# Patient Record
Sex: Male | Born: 1960
Health system: Southern US, Community
[De-identification: ages and names within clinical notes are randomized; demographics above are authoritative.]

## PROBLEM LIST (undated history)

## (undated) DIAGNOSIS — Z21 Asymptomatic human immunodeficiency virus [HIV] infection status: Secondary | ICD-10-CM

## (undated) DIAGNOSIS — F1010 Alcohol abuse, uncomplicated: Secondary | ICD-10-CM

## (undated) DIAGNOSIS — F1210 Cannabis abuse, uncomplicated: Secondary | ICD-10-CM

## (undated) DIAGNOSIS — Z720 Tobacco use: Secondary | ICD-10-CM

## (undated) DIAGNOSIS — G709 Myoneural disorder, unspecified: Secondary | ICD-10-CM

## (undated) DIAGNOSIS — I639 Cerebral infarction, unspecified: Secondary | ICD-10-CM

## (undated) DIAGNOSIS — F1410 Cocaine abuse, uncomplicated: Secondary | ICD-10-CM

## (undated) DIAGNOSIS — E785 Hyperlipidemia, unspecified: Secondary | ICD-10-CM

## (undated) DIAGNOSIS — I10 Essential (primary) hypertension: Secondary | ICD-10-CM

## (undated) DIAGNOSIS — R569 Unspecified convulsions: Secondary | ICD-10-CM

## (undated) DIAGNOSIS — G3184 Mild cognitive impairment, so stated: Secondary | ICD-10-CM

## (undated) DIAGNOSIS — F1910 Other psychoactive substance abuse, uncomplicated: Secondary | ICD-10-CM

## (undated) DIAGNOSIS — B20 Human immunodeficiency virus [HIV] disease: Secondary | ICD-10-CM

## (undated) HISTORY — DX: Myoneural disorder, unspecified: G70.9

## (undated) HISTORY — PX: HEMORRHOID SURGERY: SHX153

## (undated) HISTORY — DX: Unspecified convulsions: R56.9

## (undated) HISTORY — PX: CLOSED REDUCTION PATELLAR: SHX5007

## (undated) HISTORY — DX: Essential (primary) hypertension: I10

## (undated) HISTORY — DX: Tobacco use: Z72.0

## (undated) HISTORY — DX: Alcohol abuse, uncomplicated: F10.10

## (undated) HISTORY — DX: Asymptomatic human immunodeficiency virus (hiv) infection status: Z21

## (undated) HISTORY — DX: Mild cognitive impairment of uncertain or unknown etiology: G31.84

## (undated) HISTORY — DX: Hyperlipidemia, unspecified: E78.5

## (undated) HISTORY — DX: Cocaine abuse, uncomplicated: F14.10

## (undated) HISTORY — DX: Other psychoactive substance abuse, uncomplicated: F19.10

## (undated) HISTORY — DX: Cerebral infarction, unspecified: I63.9

## (undated) HISTORY — DX: Cannabis abuse, uncomplicated: F12.10

## (undated) HISTORY — DX: Human immunodeficiency virus (HIV) disease: B20

## (undated) MED FILL — Divalproex Sodium Cap Delayed Release Sprinkle 125 MG: ORAL | Fill #0 | Status: CN

---

## 2001-11-27 ENCOUNTER — Emergency Department (HOSPITAL_COMMUNITY): Admission: EM | Admit: 2001-11-27 | Discharge: 2001-11-27 | Payer: Self-pay | Admitting: Emergency Medicine

## 2001-11-29 ENCOUNTER — Emergency Department (HOSPITAL_COMMUNITY): Admission: EM | Admit: 2001-11-29 | Discharge: 2001-11-29 | Payer: Self-pay | Admitting: *Deleted

## 2002-08-03 ENCOUNTER — Encounter: Payer: Self-pay | Admitting: Emergency Medicine

## 2002-08-03 ENCOUNTER — Inpatient Hospital Stay (HOSPITAL_COMMUNITY): Admission: EM | Admit: 2002-08-03 | Discharge: 2002-08-13 | Payer: Self-pay | Admitting: Emergency Medicine

## 2002-08-06 ENCOUNTER — Encounter: Payer: Self-pay | Admitting: Internal Medicine

## 2002-08-07 ENCOUNTER — Encounter: Payer: Self-pay | Admitting: Internal Medicine

## 2002-08-20 ENCOUNTER — Encounter: Admission: RE | Admit: 2002-08-20 | Discharge: 2002-08-20 | Payer: Self-pay | Admitting: Internal Medicine

## 2002-09-24 ENCOUNTER — Encounter (INDEPENDENT_AMBULATORY_CARE_PROVIDER_SITE_OTHER): Payer: Self-pay | Admitting: *Deleted

## 2002-09-24 ENCOUNTER — Ambulatory Visit (HOSPITAL_BASED_OUTPATIENT_CLINIC_OR_DEPARTMENT_OTHER): Admission: RE | Admit: 2002-09-24 | Discharge: 2002-09-24 | Payer: Self-pay | Admitting: Surgery

## 2007-12-18 ENCOUNTER — Encounter: Admission: RE | Admit: 2007-12-18 | Discharge: 2007-12-18 | Payer: Self-pay | Admitting: Orthopedic Surgery

## 2007-12-26 ENCOUNTER — Emergency Department (HOSPITAL_COMMUNITY): Admission: EM | Admit: 2007-12-26 | Discharge: 2007-12-26 | Payer: Self-pay | Admitting: Family Medicine

## 2008-01-19 HISTORY — PX: SHOULDER ARTHROSCOPY: SHX128

## 2008-01-22 ENCOUNTER — Encounter: Admission: RE | Admit: 2008-01-22 | Discharge: 2008-01-22 | Payer: Self-pay | Admitting: Orthopedic Surgery

## 2008-02-12 ENCOUNTER — Inpatient Hospital Stay (HOSPITAL_COMMUNITY): Admission: RE | Admit: 2008-02-12 | Discharge: 2008-02-16 | Payer: Self-pay | Admitting: Orthopedic Surgery

## 2008-02-12 ENCOUNTER — Encounter (INDEPENDENT_AMBULATORY_CARE_PROVIDER_SITE_OTHER): Payer: Self-pay | Admitting: Orthopedic Surgery

## 2008-02-12 ENCOUNTER — Encounter: Payer: Self-pay | Admitting: Internal Medicine

## 2008-02-13 ENCOUNTER — Encounter: Payer: Self-pay | Admitting: Internal Medicine

## 2008-02-13 LAB — CONVERTED CEMR LAB: TSH: 1.414 microintl units/mL

## 2008-06-24 ENCOUNTER — Ambulatory Visit (HOSPITAL_COMMUNITY): Admission: RE | Admit: 2008-06-24 | Discharge: 2008-06-24 | Payer: Self-pay | Admitting: Internal Medicine

## 2008-06-24 ENCOUNTER — Ambulatory Visit: Payer: Self-pay | Admitting: Internal Medicine

## 2008-06-24 ENCOUNTER — Encounter: Payer: Self-pay | Admitting: Internal Medicine

## 2008-06-24 DIAGNOSIS — F17200 Nicotine dependence, unspecified, uncomplicated: Secondary | ICD-10-CM | POA: Insufficient documentation

## 2008-06-24 DIAGNOSIS — I10 Essential (primary) hypertension: Secondary | ICD-10-CM | POA: Insufficient documentation

## 2008-06-24 DIAGNOSIS — M25512 Pain in left shoulder: Secondary | ICD-10-CM | POA: Insufficient documentation

## 2008-06-24 LAB — CONVERTED CEMR LAB
Glucose, Urine, Semiquant: NEGATIVE
Ketones, urine, test strip: NEGATIVE
Nitrite: NEGATIVE
Protein, U semiquant: >=300
Specific Gravity, Urine: >=1.03
Urobilinogen, UA: 0.2
WBC Urine, dipstick: NEGATIVE
pH: 5.5

## 2008-06-26 DIAGNOSIS — E785 Hyperlipidemia, unspecified: Secondary | ICD-10-CM | POA: Insufficient documentation

## 2008-06-26 LAB — CONVERTED CEMR LAB
ALT: 11 units/L (ref 0–53)
AST: 13 units/L (ref 0–37)
Albumin: 2.9 g/dL — ABNORMAL LOW (ref 3.5–5.2)
Alkaline Phosphatase: 111 units/L (ref 39–117)
Amphetamine Screen, Ur: NEGATIVE
BUN: 16 mg/dL (ref 6–23)
Barbiturate Quant, Ur: NEGATIVE
Benzodiazepines.: NEGATIVE
CO2: 25 meq/L (ref 19–32)
Calcium: 8.2 mg/dL — ABNORMAL LOW (ref 8.4–10.5)
Chloride: 104 meq/L (ref 96–112)
Cholesterol: 198 mg/dL (ref 0–200)
Cocaine Metabolites: NEGATIVE
Creatinine, Ser: 1.36 mg/dL (ref 0.40–1.50)
Creatinine,U: 336.8 mg/dL
Glucose, Bld: 84 mg/dL (ref 70–99)
HCT: 37.4 % — ABNORMAL LOW (ref 39.0–52.0)
HDL: 34 mg/dL — ABNORMAL LOW (ref >39)
Hemoglobin: 12.5 g/dL — ABNORMAL LOW (ref 13.0–17.0)
LDL Cholesterol: 150 mg/dL — ABNORMAL HIGH (ref 0–99)
MCHC: 33.4 g/dL (ref 30.0–36.0)
MCV: 84.8 fL (ref 78.0–100.0)
Marijuana Metabolite: POSITIVE — AB
Methadone: NEGATIVE
Opiates: NEGATIVE
Phencyclidine (PCP): NEGATIVE
Platelets: 207 10*3/uL (ref 150–400)
Potassium: 4 meq/L (ref 3.5–5.3)
Propoxyphene: NEGATIVE
RBC: 4.41 M/uL (ref 4.22–5.81)
RDW: 14.9 % (ref 11.5–15.5)
Sodium: 137 meq/L (ref 135–145)
Total Bilirubin: 0.4 mg/dL (ref 0.3–1.2)
Total CHOL/HDL Ratio: 5.8
Total Protein: 7.4 g/dL (ref 6.0–8.3)
Triglycerides: 72 mg/dL (ref <150)
VLDL: 14 mg/dL (ref 0–40)
WBC: 4.6 10*3/uL (ref 4.0–10.5)

## 2008-08-08 ENCOUNTER — Ambulatory Visit: Payer: Self-pay | Admitting: Internal Medicine

## 2008-08-08 ENCOUNTER — Encounter: Payer: Self-pay | Admitting: Internal Medicine

## 2008-08-08 DIAGNOSIS — F1910 Other psychoactive substance abuse, uncomplicated: Secondary | ICD-10-CM | POA: Insufficient documentation

## 2008-08-08 DIAGNOSIS — F0150 Vascular dementia without behavioral disturbance: Secondary | ICD-10-CM | POA: Insufficient documentation

## 2008-08-09 ENCOUNTER — Encounter: Payer: Self-pay | Admitting: Internal Medicine

## 2008-08-09 LAB — CONVERTED CEMR LAB: RBC Folate: 711 ng/mL — ABNORMAL HIGH (ref 180–600)

## 2008-08-11 ENCOUNTER — Ambulatory Visit (HOSPITAL_COMMUNITY): Admission: RE | Admit: 2008-08-11 | Discharge: 2008-08-11 | Payer: Self-pay | Admitting: Internal Medicine

## 2008-08-12 ENCOUNTER — Encounter: Payer: Self-pay | Admitting: Internal Medicine

## 2008-08-17 DIAGNOSIS — B20 Human immunodeficiency virus [HIV] disease: Secondary | ICD-10-CM | POA: Insufficient documentation

## 2008-08-17 LAB — CONVERTED CEMR LAB
HIV-1 antibody: POSITIVE — AB
HIV-2 Ab: UNDETERMINED — AB
HIV: REACTIVE
Vitamin B-12: 435 pg/mL (ref 211–911)

## 2008-08-28 ENCOUNTER — Ambulatory Visit: Payer: Self-pay | Admitting: *Deleted

## 2008-08-28 ENCOUNTER — Encounter (INDEPENDENT_AMBULATORY_CARE_PROVIDER_SITE_OTHER): Payer: Self-pay | Admitting: *Deleted

## 2008-09-01 ENCOUNTER — Ambulatory Visit: Payer: Self-pay | Admitting: Internal Medicine

## 2008-09-01 ENCOUNTER — Encounter (INDEPENDENT_AMBULATORY_CARE_PROVIDER_SITE_OTHER): Payer: Self-pay | Admitting: *Deleted

## 2008-09-03 LAB — CONVERTED CEMR LAB
ALT: 12 units/L (ref 0–53)
AST: 13 units/L (ref 0–37)
Albumin: 3.1 g/dL — ABNORMAL LOW (ref 3.5–5.2)
Alkaline Phosphatase: 116 units/L (ref 39–117)
BUN: 22 mg/dL (ref 6–23)
Band Neutrophils: 0 % (ref 0–10)
Basophils Absolute: 0 10*3/uL (ref 0.0–0.1)
Basophils Relative: 0 % (ref 0–1)
Bilirubin Urine: NEGATIVE
CO2: 25 meq/L (ref 19–32)
Calcium: 8.7 mg/dL (ref 8.4–10.5)
Chlamydia, Swab/Urine, PCR: NEGATIVE
Chloride: 105 meq/L (ref 96–112)
Cholesterol: 176 mg/dL (ref 0–200)
Creatinine, Ser: 1.25 mg/dL (ref 0.40–1.50)
Eosinophils Absolute: 0.2 10*3/uL (ref 0.0–0.7)
Eosinophils Relative: 3 % (ref 0–5)
GC Probe Amp, Urine: NEGATIVE
Glucose, Bld: 92 mg/dL (ref 70–99)
HCT: 34.5 % — ABNORMAL LOW (ref 39.0–52.0)
HCV Ab: NEGATIVE
HDL: 35 mg/dL — ABNORMAL LOW (ref >39)
HIV 1 RNA Quant: 43200 copies/mL — ABNORMAL HIGH (ref <48)
HIV-1 RNA Quant, Log: 4.64 — ABNORMAL HIGH (ref <1.68)
Hemoglobin: 11.2 g/dL — ABNORMAL LOW (ref 13.0–17.0)
Hep A IgM: NEGATIVE
Hep B C IgM: NEGATIVE
Hepatitis B Surface Ag: NEGATIVE
LDL Cholesterol: 122 mg/dL — ABNORMAL HIGH (ref 0–99)
Leukocytes, UA: NEGATIVE
Lymphocytes Relative: 15 % (ref 12–46)
Lymphs Abs: 0.8 10*3/uL (ref 0.7–4.0)
MCHC: 32.5 g/dL (ref 30.0–36.0)
MCV: 82.9 fL (ref 78.0–100.0)
Monocytes Absolute: 0.3 10*3/uL (ref 0.1–1.0)
Monocytes Relative: 5 % (ref 3–12)
Neutro Abs: 4.2 10*3/uL (ref 1.7–7.7)
Neutrophils Relative %: 77 % (ref 43–77)
Nitrite: NEGATIVE
Platelets: 230 10*3/uL (ref 150–400)
Potassium: 3.9 meq/L (ref 3.5–5.3)
Protein, ur: >300 mg/dL — AB
RBC / HPF: NONE SEEN (ref <3)
RBC: 4.16 M/uL — ABNORMAL LOW (ref 4.22–5.81)
RDW: 14.8 % (ref 11.5–15.5)
Sodium: 140 meq/L (ref 135–145)
Specific Gravity, Urine: 1.031 — ABNORMAL HIGH (ref 1.005–1.030)
Total Bilirubin: 0.2 mg/dL — ABNORMAL LOW (ref 0.3–1.2)
Total CHOL/HDL Ratio: 5
Total Protein: 7.8 g/dL (ref 6.0–8.3)
Triglycerides: 96 mg/dL (ref <150)
Urine Glucose: NEGATIVE mg/dL
Urobilinogen, UA: 0.2 (ref 0.0–1.0)
VLDL: 19 mg/dL (ref 0–40)
WBC: 5.4 10*3/uL (ref 4.0–10.5)
pH: 6 (ref 5.0–8.0)

## 2008-09-15 ENCOUNTER — Ambulatory Visit: Payer: Self-pay | Admitting: Internal Medicine

## 2008-09-18 ENCOUNTER — Ambulatory Visit: Payer: Self-pay | Admitting: Internal Medicine

## 2008-10-01 ENCOUNTER — Encounter (INDEPENDENT_AMBULATORY_CARE_PROVIDER_SITE_OTHER): Payer: Self-pay | Admitting: *Deleted

## 2008-10-07 ENCOUNTER — Ambulatory Visit (HOSPITAL_COMMUNITY): Admission: RE | Admit: 2008-10-07 | Discharge: 2008-10-07 | Payer: Self-pay | Admitting: Internal Medicine

## 2008-10-07 ENCOUNTER — Ambulatory Visit: Payer: Self-pay | Admitting: Internal Medicine

## 2008-10-17 ENCOUNTER — Encounter (INDEPENDENT_AMBULATORY_CARE_PROVIDER_SITE_OTHER): Payer: Self-pay | Admitting: *Deleted

## 2008-10-21 ENCOUNTER — Ambulatory Visit: Payer: Self-pay | Admitting: Internal Medicine

## 2008-10-21 DIAGNOSIS — K089 Disorder of teeth and supporting structures, unspecified: Secondary | ICD-10-CM | POA: Insufficient documentation

## 2008-10-23 ENCOUNTER — Emergency Department (HOSPITAL_COMMUNITY): Admission: EM | Admit: 2008-10-23 | Discharge: 2008-10-23 | Payer: Self-pay | Admitting: Family Medicine

## 2008-10-28 ENCOUNTER — Ambulatory Visit: Payer: Self-pay | Admitting: Internal Medicine

## 2008-10-28 ENCOUNTER — Encounter (INDEPENDENT_AMBULATORY_CARE_PROVIDER_SITE_OTHER): Payer: Self-pay | Admitting: *Deleted

## 2008-10-28 DIAGNOSIS — IMO0002 Reserved for concepts with insufficient information to code with codable children: Secondary | ICD-10-CM | POA: Insufficient documentation

## 2008-10-28 LAB — CONVERTED CEMR LAB
HIV 1 RNA Quant: 576 copies/mL — ABNORMAL HIGH (ref <48)
HIV-1 RNA Quant, Log: 2.76 — ABNORMAL HIGH (ref <1.68)

## 2008-11-05 ENCOUNTER — Telehealth: Payer: Self-pay | Admitting: Infectious Disease

## 2008-11-10 ENCOUNTER — Telehealth: Payer: Self-pay | Admitting: Internal Medicine

## 2008-11-19 ENCOUNTER — Encounter: Payer: Self-pay | Admitting: Internal Medicine

## 2008-11-19 ENCOUNTER — Ambulatory Visit: Payer: Self-pay | Admitting: Internal Medicine

## 2008-11-20 DIAGNOSIS — D649 Anemia, unspecified: Secondary | ICD-10-CM | POA: Insufficient documentation

## 2008-11-20 DIAGNOSIS — N183 Chronic kidney disease, stage 3 unspecified: Secondary | ICD-10-CM | POA: Insufficient documentation

## 2008-11-20 DIAGNOSIS — N1832 Chronic kidney disease, stage 3b: Secondary | ICD-10-CM | POA: Insufficient documentation

## 2008-11-20 DIAGNOSIS — N182 Chronic kidney disease, stage 2 (mild): Secondary | ICD-10-CM

## 2008-11-20 DIAGNOSIS — N184 Chronic kidney disease, stage 4 (severe): Secondary | ICD-10-CM | POA: Insufficient documentation

## 2008-11-20 LAB — CONVERTED CEMR LAB
ALT: 19 units/L (ref 0–53)
AST: 16 units/L (ref 0–37)
Albumin: 3.4 g/dL — ABNORMAL LOW (ref 3.5–5.2)
Alkaline Phosphatase: 116 units/L (ref 39–117)
BUN: 33 mg/dL — ABNORMAL HIGH (ref 6–23)
Basophils Absolute: 0 10*3/uL (ref 0.0–0.1)
Basophils Relative: 1 % (ref 0–1)
CO2: 25 meq/L (ref 19–32)
Calcium: 8.6 mg/dL (ref 8.4–10.5)
Chloride: 107 meq/L (ref 96–112)
Creatinine, Ser: 1.51 mg/dL — ABNORMAL HIGH (ref 0.40–1.50)
Eosinophils Absolute: 0.6 10*3/uL (ref 0.0–0.7)
Eosinophils Relative: 9 % — ABNORMAL HIGH (ref 0–5)
GFR calc Af Amer: 59 mL/min — ABNORMAL LOW (ref >60)
GFR calc non Af Amer: 49 mL/min — ABNORMAL LOW (ref >60)
Glucose, Bld: 79 mg/dL (ref 70–99)
HCT: 33.1 % — ABNORMAL LOW (ref 39.0–52.0)
Hemoglobin: 11.6 g/dL — ABNORMAL LOW (ref 13.0–17.0)
Lymphocytes Relative: 19 % (ref 12–46)
Lymphs Abs: 1.3 10*3/uL (ref 0.7–4.0)
MCHC: 35 g/dL (ref 30.0–36.0)
MCV: 84.9 fL (ref 78.0–100.0)
Monocytes Absolute: 0.3 10*3/uL (ref 0.1–1.0)
Monocytes Relative: 4 % (ref 3–12)
Neutro Abs: 4.6 10*3/uL (ref 1.7–7.7)
Neutrophils Relative %: 67 % (ref 43–77)
Platelets: 257 10*3/uL (ref 150–400)
Potassium: 4 meq/L (ref 3.5–5.3)
RBC: 3.9 M/uL — ABNORMAL LOW (ref 4.22–5.81)
RDW: 17.1 % — ABNORMAL HIGH (ref 11.5–15.5)
Sodium: 140 meq/L (ref 135–145)
Total Bilirubin: 0.3 mg/dL (ref 0.3–1.2)
Total Protein: 8.1 g/dL (ref 6.0–8.3)
WBC: 6.9 10*3/uL (ref 4.0–10.5)

## 2008-12-17 ENCOUNTER — Telehealth: Payer: Self-pay | Admitting: Internal Medicine

## 2009-01-06 ENCOUNTER — Telehealth: Payer: Self-pay | Admitting: Internal Medicine

## 2009-02-24 ENCOUNTER — Telehealth: Payer: Self-pay | Admitting: Internal Medicine

## 2009-03-16 ENCOUNTER — Telehealth: Payer: Self-pay | Admitting: Internal Medicine

## 2009-04-21 ENCOUNTER — Ambulatory Visit: Payer: Self-pay | Admitting: Internal Medicine

## 2009-04-27 LAB — CONVERTED CEMR LAB
HIV 1 RNA Quant: 382 copies/mL — ABNORMAL HIGH (ref <48)
HIV-1 RNA Quant, Log: 2.58 — ABNORMAL HIGH (ref <1.68)

## 2009-04-30 LAB — CONVERTED CEMR LAB
BUN: 16 mg/dL (ref 6–23)
CO2: 27 meq/L (ref 19–32)
Calcium: 8.8 mg/dL (ref 8.4–10.5)
Chloride: 103 meq/L (ref 96–112)
Creatinine, Ser: 1.21 mg/dL (ref 0.40–1.50)
Glucose, Bld: 79 mg/dL (ref 70–99)
Potassium: 3.9 meq/L (ref 3.5–5.3)
Sodium: 140 meq/L (ref 135–145)

## 2009-06-26 ENCOUNTER — Encounter: Payer: Self-pay | Admitting: *Deleted

## 2009-07-29 ENCOUNTER — Ambulatory Visit: Payer: Self-pay | Admitting: Internal Medicine

## 2009-07-30 ENCOUNTER — Encounter: Payer: Self-pay | Admitting: Internal Medicine

## 2009-07-31 DIAGNOSIS — J111 Influenza due to unidentified influenza virus with other respiratory manifestations: Secondary | ICD-10-CM | POA: Insufficient documentation

## 2009-08-01 LAB — CONVERTED CEMR LAB
BUN: 26 mg/dL — ABNORMAL HIGH (ref 6–23)
Basophils Absolute: 0 10*3/uL (ref 0.0–0.1)
Basophils Relative: 0 % (ref 0–1)
CO2: 26 meq/L (ref 19–32)
Calcium: 8.8 mg/dL (ref 8.4–10.5)
Chloride: 97 meq/L (ref 96–112)
Creatinine, Ser: 1.93 mg/dL — ABNORMAL HIGH (ref 0.40–1.50)
Eosinophils Absolute: 0 10*3/uL (ref 0.0–0.7)
Eosinophils Relative: 0 % (ref 0–5)
Glucose, Bld: 82 mg/dL (ref 70–99)
HCT: 45.9 % (ref 39.0–52.0)
Hemoglobin: 15.3 g/dL (ref 13.0–17.0)
Lymphocytes Relative: 14 % (ref 12–46)
Lymphs Abs: 1 10*3/uL (ref 0.7–4.0)
MCHC: 33.3 g/dL (ref 30.0–36.0)
MCV: 87.1 fL (ref >=78.0)
Monocytes Absolute: 0.4 10*3/uL (ref 0.1–1.0)
Monocytes Relative: 6 % (ref 3–12)
Neutro Abs: 5.7 10*3/uL (ref 1.7–7.7)
Neutrophils Relative %: 80 % — ABNORMAL HIGH (ref 43–77)
Platelets: 138 10*3/uL — ABNORMAL LOW (ref 150–400)
Potassium: 4.1 meq/L (ref 3.5–5.3)
RBC: 5.27 M/uL (ref 4.22–5.81)
RDW: 15.8 % — ABNORMAL HIGH (ref 11.5–15.5)
Sodium: 136 meq/L (ref 135–145)
WBC: 7.1 10*3/uL (ref 4.0–10.5)

## 2009-08-07 ENCOUNTER — Telehealth: Payer: Self-pay | Admitting: *Deleted

## 2009-08-07 ENCOUNTER — Ambulatory Visit: Payer: Self-pay | Admitting: Internal Medicine

## 2009-08-08 LAB — CONVERTED CEMR LAB
BUN: 20 mg/dL (ref 6–23)
CO2: 24 meq/L (ref 19–32)
Calcium: 8.8 mg/dL (ref 8.4–10.5)
Chloride: 108 meq/L (ref 96–112)
Creatinine, Ser: 1.28 mg/dL (ref 0.40–1.50)
Glucose, Bld: 113 mg/dL — ABNORMAL HIGH (ref 70–99)
Potassium: 4.4 meq/L (ref 3.5–5.3)
Sodium: 142 meq/L (ref 135–145)

## 2009-08-26 IMAGING — CR DG CHEST 2V
2 series · 2 of 2 positions shown · non-contrast
Comparison: 12/26/2007

CLINICAL DATA: Positive PPD, hypertension

CHEST - 2 VIEW

[w chest pa]
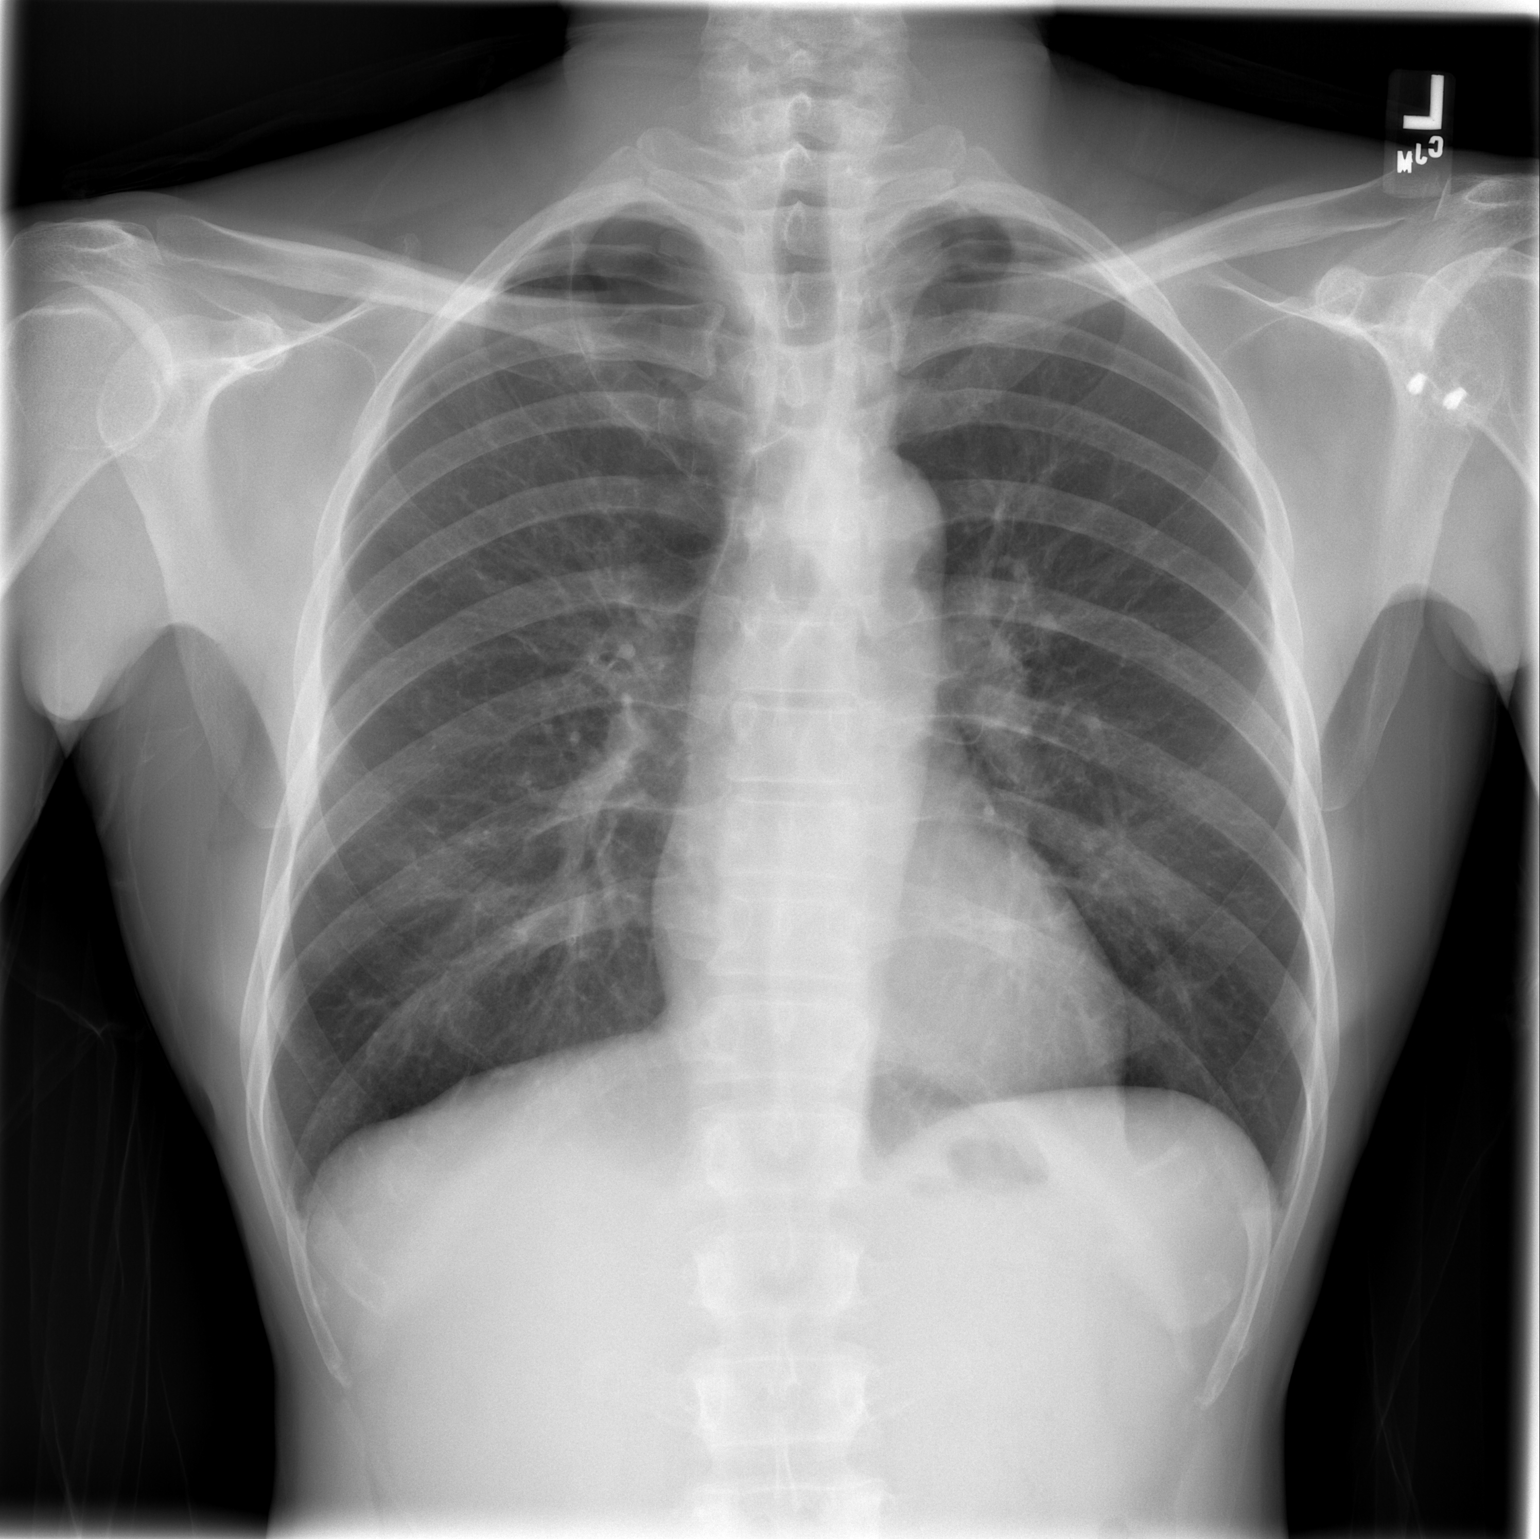

[w chest lat]
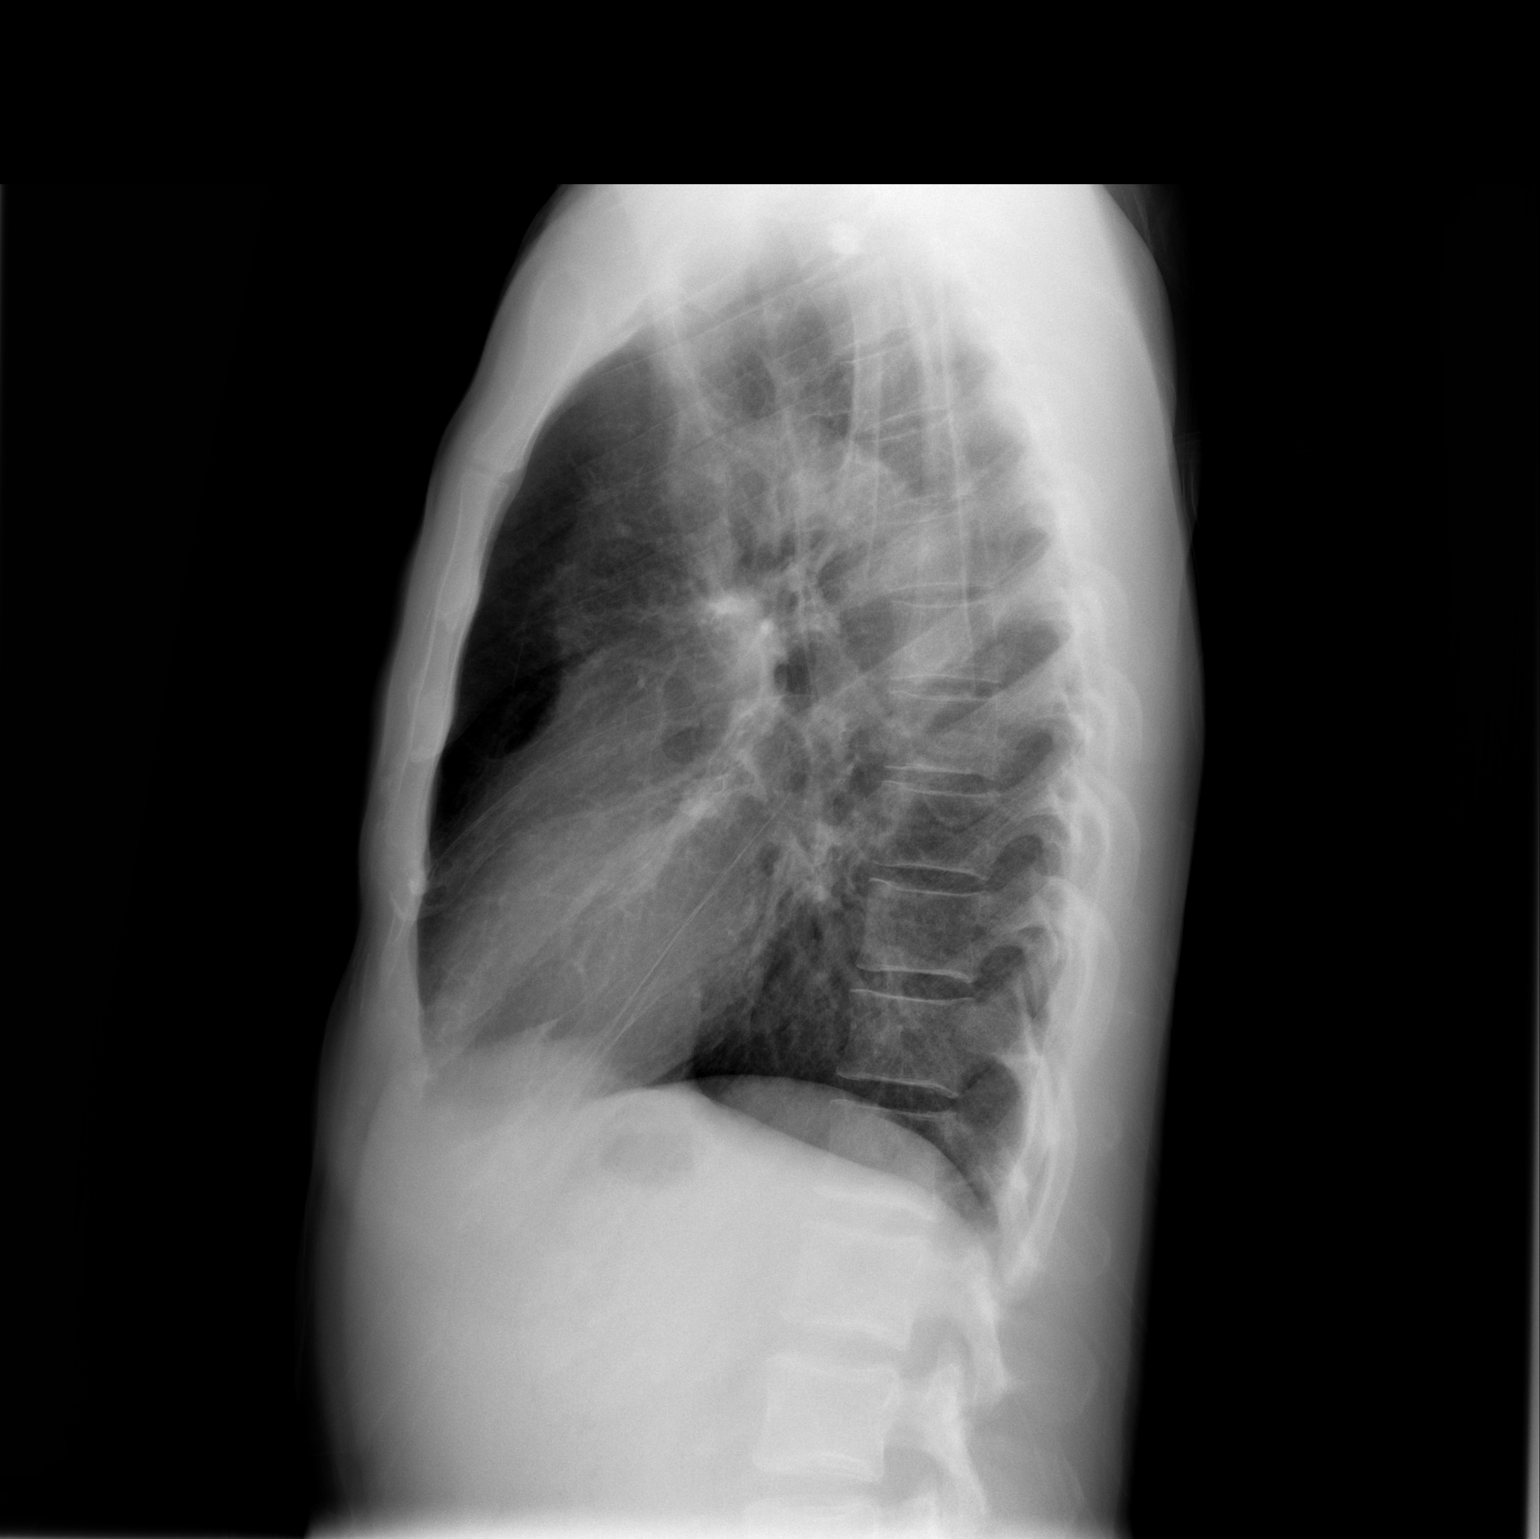

[2 of 2 positions shown; findings below may reference images not displayed]

FINDINGS: Prior left shoulder surgery.  Midline trachea.  Normal
heart mediastinal contours.  Azygos fissure in the right upper
lobe, a normal variant.  Probable lung right apical bullous
disease.  No pleural effusion pneumothorax.  Lung fields clear.  No
significant change from priors.  No effusions.
IMPRESSION: No active cardiopulmonary disease.

## 2010-01-29 ENCOUNTER — Telehealth: Payer: Self-pay | Admitting: Internal Medicine

## 2010-02-01 ENCOUNTER — Encounter: Payer: Self-pay | Admitting: Internal Medicine

## 2010-02-04 ENCOUNTER — Ambulatory Visit: Payer: Self-pay | Admitting: Internal Medicine

## 2010-02-04 ENCOUNTER — Encounter: Payer: Self-pay | Admitting: Licensed Clinical Social Worker

## 2010-02-04 DIAGNOSIS — F322 Major depressive disorder, single episode, severe without psychotic features: Secondary | ICD-10-CM | POA: Insufficient documentation

## 2010-02-04 LAB — CONVERTED CEMR LAB
ALT: 17 units/L (ref 0–53)
AST: 23 units/L (ref 0–37)
Albumin: 3.9 g/dL (ref 3.5–5.2)
Alkaline Phosphatase: 117 units/L (ref 39–117)
BUN: 17 mg/dL (ref 6–23)
CO2: 24 meq/L (ref 19–32)
Calcium: 8.5 mg/dL (ref 8.4–10.5)
Chloride: 102 meq/L (ref 96–112)
Cholesterol: 183 mg/dL (ref 0–200)
Creatinine, Ser: 1.25 mg/dL (ref 0.40–1.50)
Glucose, Bld: 80 mg/dL (ref 70–99)
HCT: 53 % — ABNORMAL HIGH (ref 39.0–52.0)
HDL: 65 mg/dL (ref >39)
HIV 1 RNA Quant: 33700 copies/mL — ABNORMAL HIGH (ref <48)
HIV-1 RNA Quant, Log: 4.53 — ABNORMAL HIGH (ref <1.68)
Hemoglobin: 17.6 g/dL — ABNORMAL HIGH (ref 13.0–17.0)
LDL Cholesterol: 100 mg/dL — ABNORMAL HIGH (ref 0–99)
MCHC: 33.2 g/dL (ref 30.0–36.0)
MCV: 90.4 fL (ref >=78.0)
Platelets: 189 10*3/uL (ref 150–400)
Potassium: 3.9 meq/L (ref 3.5–5.3)
RBC: 5.86 M/uL — ABNORMAL HIGH (ref 4.22–5.81)
RDW: 15.9 % — ABNORMAL HIGH (ref 11.5–15.5)
Sodium: 135 meq/L (ref 135–145)
TSH: 0.516 microintl units/mL (ref 0.350–4.5)
Total Bilirubin: 0.5 mg/dL (ref 0.3–1.2)
Total CHOL/HDL Ratio: 2.8
Total Protein: 7.8 g/dL (ref 6.0–8.3)
Triglycerides: 89 mg/dL (ref <150)
VLDL: 18 mg/dL (ref 0–40)
WBC: 4.3 10*3/uL (ref 4.0–10.5)

## 2010-04-29 ENCOUNTER — Ambulatory Visit: Payer: Self-pay | Admitting: Internal Medicine

## 2010-07-12 ENCOUNTER — Encounter: Payer: Self-pay | Admitting: Orthopedic Surgery

## 2010-07-20 NOTE — Progress Notes (Signed)
Summary: refill/ hla  Phone Note Refill Request Message from:  Fax from Pharmacy on January 06, 2009 11:17 AM  Refills Requested: Medication #1:  HYDROCHLOROTHIAZIDE 25 MG TABS Take 1 tablet by mouth once a day   Last Refilled: 6/15 Initial call taken by: Freddy Finner RN,  January 06, 2009 11:17 AM  Follow-up for Phone Call        sent electronically Follow-up by: Alphia Moh MD,  January 06, 2009 6:57 PM    Prescriptions: HYDROCHLOROTHIAZIDE 25 MG TABS (HYDROCHLOROTHIAZIDE) Take 1 tablet by mouth once a day  #30 x 3   Entered and Authorized by:   Alphia Moh MD   Signed by:   Alphia Moh MD on 01/06/2009   Method used:   Electronically to        [facility] [phone]* (retail)       [address]       [place], [place]  [address]       Ph: [phone] or [phone]       Fax: ID:[phone]   [place]:   [phone]

## 2010-07-20 NOTE — Progress Notes (Signed)
Summary: refill/ hla  Phone Note Refill Request Message from:  Fax from Pharmacy on March 16, 2009 5:25 PM  Refills Requested: Medication #1:  PRAVACHOL 40 MG TABS Take 1 tab by mouth at bedtime  Medication #2:  AMLODIPINE BESYLATE 10 MG TABS 1 tablet by mouth daily Initial call taken by: Freddy Finner RN,  March 16, 2009 5:25 PM  Follow-up for Phone Call        done  Follow-up by: Alphia Moh MD,  March 16, 2009 6:08 PM    Prescriptions: PRAVACHOL 40 MG TABS (PRAVASTATIN SODIUM) Take 1 tab by mouth at bedtime  #30 x 3   Entered and Authorized by:   Alphia Moh MD   Signed by:   Alphia Moh MD on 03/16/2009   Method used:   Electronically to        [facility] [phone]* (retail)       [address] [place]  [address]       Ph: [phone] or [phone]       Fax: ID:[phone]   RxIDST:[id number] AMLODIPINE BESYLATE 10 MG TABS (AMLODIPINE BESYLATE) 1 tablet by mouth daily  #30 x 3   Entered and Authorized by:   Alphia Moh MD   Signed by:   Alphia Moh MD on 03/16/2009   Method used:   Electronically to        [facility] [phone]* (retail)       [address]       [place], [place]  [address]       Ph: [phone] or [phone]       Fax: ID:[phone]   RxID:   [id number]

## 2010-07-20 NOTE — Assessment & Plan Note (Signed)
Summary: NEW ID [facility] IM    Social History:    Reviewed history from 08/08/2008 and no changes required:       Lives in [place] with male friend       Not working       Current Smoker:  1/2  pack/month x 17 years (more in past)       Alcohol use-no (past, clean x 4 years)       Drug use - yes, THC    Drug Use:  No   Infectious Disease New Patient Intake Referring MD/Agency: [facility]/ [facility]  Address: [address] [place], [place] [address]  Medical Records: Received Health Insurance / Payor: Medicare Employer: none      Do you have a Primary physician: No Are family members aware of patient's diagnosis?  If so, are they supportive? girlfriend  Describe patient's current social support (family, friends, support groups): [employer] support  Medical History Medication Allergies: No   Medical Hx Comments: Diagnosed while in Hospital  Tobacco use: current Amt: 1/2 per day. Counseled to quit/cut down: yes  Behavioral Health Assessment Have you ever been diagnosed with depression or mental illness? No  Do you drink alcohol? No Do you use recreational drugs? No Do you feel you have a problem with drugs and/or alcohol? No   Have you ever been in a treatment facility for any addiction? No  HIV Intake Information When did you first test positive for HIV? 07/22/2008 Type of test Conducted: WB   Risk Factor(s) for HIV: Heterosexual contact  Method of Exposure to HIV: Heterosexual Intercourse Have you ever been hospitalized for any HIV-related condition? Yes Hospital Name 1: unknown  Reason: anemia/flu  Date: 07-2008  Newly Diagnosed Patients Has a Disease Intervention Specialist from the Health Department contacted the patient? Yes.   The patient has been informed that the [facility] will contact ALL newly reported cases. Health Department Contact:  [phone]   (SSN is needed for confirmation)  Health Department Contact:  [phone]            (SSN is needed for confirmation)  Person  Reporting: prev. reported Do you have any Non-HIV related medical conditions or other prior hospitalizations or surgeries? Yes  HIV Medications Information The patient is currently NOT taking any HIV medications.  Infection History  Patient has been diagnosed with the following opportunistic infections: Pneumonia, Recurrent Yes Are there any other symptoms you need to discuss? No Have you received literature/education prior to this visit about HIV/AIDS? No  Sexual History Are you in a current relationship? Yes How long have you been in this relationship? 20 Are they aware of your diagnosis? Yes Have they been tested for HIV? Yes What were the results: Unknown Details: partner-awaiting results If no, when was your last encounter? 2009 Was this protected intercourse? No When was your last unprotected sex? 2009 Safe Sex Counseling/Pamphlet Given Sexual History Comments: Pt states he has been in a 20 year relationship and has only had sex with his girlfriend. He is puzzled how he could be infected.  Evaluation and Follow-Up INTAKE CHECK LIST: HIV Education, Safe Sex Counseling, HIV Material Given  Prevention For Positives: 09/26/2008   Safe sex practices discussed with patient. Condoms offered. Are you in need of condoms at this time? No Our patient has been informed that condoms are always available in this clinic.   Brochure Provided for Above Organizations? No Does patient have problems that warrant Social Worker referral? No  Immunization History:  Influenza Immunization History:    Influenza:  historical (07/22/2008)

## 2010-07-20 NOTE — Progress Notes (Signed)
Summary: Peter Cox would like to speak to Dr. Tomma Lightning  Phone Note Other Incoming Call back at [phone]   Caller: Peter Cox [phone] Summary of Call: Would like Dr. Tomma Lightning to call her to talk about the pt's condition.  Very important!  RN is unsure who this person is and how she is related to the pt.  Please advise.  Lorne Skeens RN  Nov 10, 2008 9:50 AM   Follow-up for Phone Call        according to IM visit not she is his partner.   I have not met her and don't know if we have the patient's permission to speak to her Follow-up by: Aldona Bar MD,  Nov 10, 2008 10:00 AM  Additional Follow-up for Phone Call Additional follow up Details #1::        pt walked in the clinic on 11/10/08 and requested a f/u appt with Dr Philbert Riser.  Pt also gave the office permission to discuss his health with his "fiance" Peter Cox.

## 2010-07-20 NOTE — Progress Notes (Signed)
Summary: meds, sw, appt/ hla  Phone Note Call from Patient   Summary of Call: pt's daughter called 8/6 and left a message, no one called her back. she is quite concerned about her father, he has not been taking his meds, pharm told her he not picked them up in awhile, also his personality is changing and he responds inappropriately to conversations. she states she is afraid "he is back out doing things that he shouldn't while she is at work" he is missing large amts of money at times also and wishes to speak w/ sw while they are in clinic for eval. she seems quite upset about this. an appt is scheduled for her day off 8/18 and this note is sent to donna t. Initial call taken by: Freddy Finner RN,  January 29, 2010 4:46 PM  Follow-up for Phone Call        Agree that Mr. Bibeault needs an evaluation in clinic.  Therefore I agree with the appointment on the caregiver's day off. Follow-up by: Oval Linsey MD,  January 29, 2010 4:50 PM

## 2010-07-20 NOTE — Miscellaneous (Signed)
Summary: clinical update/ryan white (need proof of income)  Clinical Lists Changes  Observations: Added new observation of [place]: Medicaid (10/17/2008 16:28) Added new observation of MEDICARINSUR: Medicare (10/17/2008 16:28) Added new observation of PAYOR: More than 1 (10/17/2008 16:28) Added new observation of AIDSDAP: No (10/17/2008 16:28) Added new observation of #CHILD<18 IN: No (10/17/2008 16:28) Added new observation of FAMILYSIZE: 1  (10/17/2008 16:28) Added new observation of HOUSING: Stable/permanent  (10/17/2008 16:28) Added new observation of FINASSESSDT: 09/18/2008  (10/17/2008 16:28) Added new observation of YEARLYEXPEN: 0  (10/17/2008 16:28) Added new observation of INFECTDIS MD: Tomma Lightning  (10/17/2008 16:28) Added new observation of REC_MESSAGE: Yes  (10/17/2008 16:28) Added new observation of RECPHONECALL: Yes  (10/17/2008 16:28) Added new observation of REC_MAIL: Yes  (10/17/2008 16:28)

## 2010-07-20 NOTE — Assessment & Plan Note (Signed)
Summary: confused, daughter coming w/pt/pcp-Shyleigh Daughtry/hla   Vital Signs:  Patient Profile:   50 Years Old Male Height:     67.3 inches (170.94 cm) Weight:      125.6 pounds (57.09 kg) BMI:     19.57 Temp:     98.2 degrees F (36.78 degrees C) oral Pulse rate:   86 / minute BP sitting:   190 / 127  (left arm) Cuff size:   regular  Pt. in pain?   no  Vitals Entered By: Lucky Rathke NT II (August 08, 2008 2:34 PM)              Is Patient Diabetic? No Nutritional Status BMI of 19 -24 = normal  Have you ever been in a relationship where you felt threatened, hurt or afraid?No   Does patient need assistance? Functional Status Self care Ambulation Normal     Chief Complaint:  BP HIGH ! / CONFUSED / DAUGHTER ( SHRONDARI FORD) IS HERE WITH HIM TODAY.Marland Kitchen  History of Present Illness: Mr Ridgell is a 50 yo man with [place] as outlined in chart.  He is here today because daughter is concerned he has been confused, forgetful, needing to coach him through things since early last year.  Pt went to [place] in 8/208, go sick and returned 05/2007.  He has not been to baseline since.  Unclear as to sickness, but seems he may of had a hypertensive crisis and hemorrhoidectomy.  However, pt was in hospital for almost 2 months and required a "bunch of blood".  Daughter complains of trouble with his memory.  Pt agrees he has not been back to normal since that trip.     Serial Vital Signs/Assessments:  Time      Position  BP       Pulse  Resp  Temp     By                     202/126                        Alphia Moh MD    Prior Medications Reviewed Using: Patient Recall  Prior Medication List:  HYDROCHLOROTHIAZIDE 25 MG TABS (HYDROCHLOROTHIAZIDE) Take 1 tablet by mouth once a day AMLODIPINE BESYLATE 10 MG TABS (AMLODIPINE BESYLATE) 1 tablet by mouth daily LISINOPRIL 20 MG TABS (LISINOPRIL) 1 tablet by mouth daily PRAVACHOL 40 MG TABS (PRAVASTATIN SODIUM) Take 1 tab by mouth at  bedtime   Current Allergies: No known allergies   Past Medical History:    HTN    tobacco abuse    Marijuana use    Previous cocaine, heavy, now clean since about 2000.    Previous alcohol, heavy, now clean since about 2005.    Left shoulder post-traumatic OA  Past Surgical History:    Reviewed history from 06/24/2008 and no changes required:       Left shoulder arthroscopy w/ extensive debridement (01/2008 by Dr. Mardelle Matte)       Right knee patellar reduction       Hemorrhoidectomy   Family History:    Reviewed history from 06/24/2008 and no changes required:       Mother deceased (57yo):  CVA, HTN, DM       Father deceased (92yo):  DM       2 sisters alive:  HTN in both       1 brother alive:  healthy  Social History:  Lives in [place] with male friend    Not working    Current Smoker:  1/2  pack/month x 17 years (more in past)    Alcohol use-no (past, clean x 4 years)    Drug use - yes, THC   Risk Factors:  Tobacco use:  current    Year started:  7 CIGARETTES PER DAY    Cigarettes:  Yes -- 1 pk per week pack(s) per day Drug use:  no Alcohol use:  no Exercise:  yes    Times per week:   2-3    Type:  WALKING Seatbelt use:    100 %   Review of Systems      See HPI  General      Denies fever and weight loss.  CV      Denies chest pain or discomfort, difficulty breathing at night, difficulty breathing while lying down, fainting, lightheadness, palpitations, shortness of breath with exertion, and swelling of feet.  Resp      Denies chest discomfort, chest pain with inspiration, cough, coughing up blood, and shortness of breath.  GI      Denies abdominal pain, bloody stools, change in bowel habits, dark tarry stools, nausea, vomiting, vomiting blood, and yellowish skin color.  GU      Denies discharge, dysuria, and hematuria.  MS      Denies muscle aches.  Derm      Denies rash.  Neuro      Denies numbness, tingling, and weakness.  Heme       Denies abnormal bruising, bleeding, enlarge lymph nodes, and skin discoloration.   Physical Exam  General:     alert, well-hydrated, and cooperative to examination.   Head:     normocephalic and atraumatic.   Eyes:     vision grossly intact, pupils equal, pupils round, and pupils reactive to light.  anicteric Mouth:     pharynx pink and moist, no erythema, and no exudates.   Neck:     supple, full ROM, no masses, no thyromegaly, no JVD, no carotid bruits, and no cervical lymphadenopathy.   Lungs:     normal respiratory effort, no accessory muscle use, normal breath sounds, no crackles, and no wheezes.   Heart:     normal rate, regular rhythm, no murmur, no gallop, no rub, and no JVD.   Abdomen:     soft, non-tender, normal bowel sounds, no distention, no masses, no guarding, and no rigidity.   ? palpable liver edge about 5cm below costal margin. Msk:     no joint tenderness, no joint swelling, and no joint warmth.   Extremities:     no edema Neurologic:     alert & oriented X3, cranial nerves II-XII intact, strength normal in all extremities, sensation intact to light touch, gait normal, and DTRs symmetrical and normal.   minimal decrease in vibratory sensation bilateral lower extremities, right > left. Skin:     no rashes, no ecchymoses, and no petechiae.   There are large areas in bilateral lower extremities, distal to knees and proximal to ankles that are hyperpigmented.  Pt states skin was dry and flaky previously but has since resolved with persistance of hyperpigmentation. Cervical Nodes:     no anterior cervical adenopathy and no posterior cervical adenopathy.   Axillary Nodes:     no R axillary adenopathy and no L axillary adenopathy.   Psych:     Oriented X3, normally interactive, good eye contact, not anxious appearing,  and not depressed appearing.    MMS score 24.    Impression & Recommendations:  Problem # 1:  MEMORY LOSS (ICD-780.93) MMS exam score 24,  mild. CT head 02/12/08 for hypertensive crisis and concern for stroke:      IMPRESSION:        1.  Old right internal capsule and thalamic lacunar infarcts.        2.  Mild to moderate chronic small vessel white matter ischemic             changes in both cerebral hemispheres, greater on the right. Unclear as to etiology, and differential is extensive at this point ranging from vascular dementia (HTN and PSA), to ongoing substance use, B12 deficiency, hypothryroidism, HIV, syphilis, etc.  Metabolic derrangements also possible but, recent labs do not have lyte, liver, or renal abnormalities to suggest etiology. Will obtain basic labs and proceed from there. Exam is non-focal, and with recent CT recently, will not proceed with further imagaing unless clinical course changes.   Orders: T-HIV Antibody  (Reflex) [id number]) T-Syphilis Test (RPR) [id number]) T-Vitamin B12 ([id number]) T-Folic Acid; RBC ([id number])   Problem # 2:  HYPERTENSION (ICD-401.9) Pt states he is taking his medications since last visit.  But again, after calling pharmacy and further discussion it is obvious he has not been taking any and after discussing this with him, he acknowledges not taking any once again.   Therefore, plan is the same as last visit: HCTZ to 25mg  (not 50mg ), keep lisinopril 20mg  (with room for increase) and keep the amlodinpine at 10mg . Will check basic labs (already had nl TSH).  Will bring back in 1 week to reassess BP and recheck BMET (Cr and K), not checked today since he has not taken his meds.   If still elevated, consider coreg ($4, two times a day dosing) and titrate to max dose If still elevated, consider clonidine. If still elevated, could titrate lisinopril and bring back in 1 week (recheck BP and BMET), though likely not to provide much improvement. If still elevated could consider spironolactone (depending on compliance and follow up), but care must be taken given ACEI regarding  possibility for hyperkalemia. Also hydralazine could be considered down the line, but three times a day - qid dosing is inconvinient in a pt that already does not adhere well to any meds.   His updated medication list for this problem includes:    Hydrochlorothiazide 25 Mg Tabs (Hydrochlorothiazide) .Marland Kitchen... Take 1 tablet by mouth once a day    Amlodipine Besylate 10 Mg Tabs (Amlodipine besylate) .Marland Kitchen... 1 tablet by mouth daily    Lisinopril 20 Mg Tabs (Lisinopril) .Marland Kitchen... 1 tablet by mouth daily  BP today: 190/127 Prior BP: 212/133 (06/24/2008)  Labs Reviewed: Creat: 1.36 (06/24/2008) Chol: 198 (06/24/2008)   HDL: 34 (06/24/2008)   LDL: 150 (06/24/2008)   TG: 72 (06/24/2008)   Problem # 3:  DYSLIPIDEMIA (ICD-272.4) As per append on lipids done last visit. Pravachol prescribed, pt has not picked up. Discussed lipid results, medication and side effects.   Pt is to pick up script and start. Repeat lipids and LFTs in 3 months or so.   His updated medication list for this problem includes:    Pravachol 40 Mg Tabs (Pravastatin sodium) .Marland Kitchen... Take 1 tab by mouth at bedtime   Problem # 4:  TOBACCO ABUSE (ICD-305.1) Encouraged smoking cessation and discussed different methods for smoking cessation.   Problem # 5:  SUBSTANCE ABUSE, MULTIPLE (ICD-305.90) Pt  adamantly denies any other substance other than marijuana. Cocaine and alcohol in the past as per PMH. Advised on cessation/abstinence, especially with concerns for memory loss.   Problem # 6:  ? of HEPATOMEGALY (ICD-789.1) LFTs wnl on last visit. Liver edge palpable about 5 cm below costal margin, but upper border difficult to ascertain.  Orders: Ultrasound (Ultrasound)   Complete Medication List: 1)  Hydrochlorothiazide 25 Mg Tabs (Hydrochlorothiazide) .... Take 1 tablet by mouth once a day 2)  Amlodipine Besylate 10 Mg Tabs (Amlodipine besylate) .Marland Kitchen.. 1 tablet by mouth daily 3)  Lisinopril 20 Mg Tabs (Lisinopril) .Marland Kitchen.. 1 tablet by  mouth daily 4)  Pravachol 40 Mg Tabs (Pravastatin sodium) .... Take 1 tab by mouth at bedtime   Patient Instructions: 1)  1)  Please schedule a follow-up appointment in 1 week. 2)  2)  Make sure to take your medications listed below. 3)  3)  Take 1/2 tablet of the hydrochlorothiazide 50mg  daily, when you run out, pick up the new prescription for 25mg  tablets (take 1 tablet daily) 4)  4)  Start the amlodipine 10mg  by mouth daily. 5)  5)  Start the lisinopril 20mg  by mouth daily. 6)  6)  Stop the hydralazine 7)  7)  Stop the clonidine. 8)  8)  Will check blood work today and EKG and will discuss results on your follow up visit. 9)  9)  It is very important for you to take your medications to control your blood pressure, since this could lead to stroke, heart attack and kidney failure if you don't.  It is also important for [name] to know what you are really taking so that we don't continue to add medications if you are not taking the ones we are already prescribing.

## 2010-07-20 NOTE — Letter (Signed)
Summary: Mini-Mental Exam  Mini-Mental Exam   Imported By: Bonner Puna 08/12/2008 12:05:57  _____________________________________________________________________  External Attachment:    Type:   Image     Comment:   External Document

## 2010-07-20 NOTE — Assessment & Plan Note (Signed)
Summary: NEW PT/PT BRINGING RECORDS/DS   Vital Signs:  Patient Profile:   50 Years Old Male Height:     67.3 inches (170.94 cm) Weight:      128.06 pounds (58.21 kg) BMI:     19.95 Temp:     97.4 degrees F (36.33 degrees C) oral Pulse rate:   87 / minute BP sitting:   212 / 133  (right arm)  Pt. in pain?   yes    Location:   headache    Intensity:   3    Type:       aching  Vitals Entered By: Sander Nephew RN (June 24, 2008 9:56 AM)              Is Patient Diabetic? No Nutritional Status BMI of 19 -24 = normal  Does patient need assistance? Functional Status Self care Ambulation Normal     Serial Vital Signs/Assessments:  Time      Position  BP       Pulse  Resp  Temp     By                     142/110                        Alphia Moh MD   Chief Complaint:  new patient and est. care.  History of Present Illness: [name] is a 50 yo man with PMH as outlined in chart.  He is here to establish care in our clinic.  Most pressing issue today is his BP.  He was hospitalized in 01/2008 for hypertensive urgency.  This occured during Left shoulder surgery.  He was noted to be markedly hypertensive requiring several doses of labetalol.  He was admitted for hypertensive crisis.  Pt comes in today with medications listed below.  Of note, he stated he had not taken his medication this morning and was markedly hypertensive.  Upon entering exam room, pt was lethargic appearing with flat/bizarre affect.  By this time, he had taken clonidine 0.2mg  and HCTZ 50mg  (part of his regimen) since his BP was so high.  History provided was very limited.  When asked, he said he took his medications as directed and didn't miss any doses.  Also stated he gets this way after taking medications.  He currently denies any drug or alcohol use.  Also denies any pain anywhere including his chest and head, no SOB, no dizziness, numbness or tingling.      Prior Medications Reviewed Using: Medication  Bottles  Prior Medication List:  HYDROCHLOROTHIAZIDE 50 MG TABS (HYDROCHLOROTHIAZIDE) 1tablet by mouth daily AMLODIPINE BESYLATE 10 MG TABS (AMLODIPINE BESYLATE) 1 tablet by mouth daily CLONIDINE HCL 0.2 MG TABS (CLONIDINE HCL) 1 tablet by mouth daily LISINOPRIL 20 MG TABS (LISINOPRIL) 1 tablet by mouth daily HYDRALAZINE HCL 50 MG TABS (HYDRALAZINE HCL) Take 1 tablet by mouth every 6 hours   Current Allergies: No known allergies   Past Medical History:    HTN    tobacco abuse    Left shoulder post-traumatic OA  Past Surgical History:    Left shoulder arthroscopy w/ extensive debridement (01/2008 by Dr. Mardelle Matte)    Right knee patellar reduction    Hemorrhoidectomy   Family History:    Mother deceased (65yo):  CVA, HTN, DM    Father deceased (62yo):  DM    2 sisters alive:  HTN in both  1 brother alive:  healthy  Social History:    Lives in [place] with male friend    Not working    Current Smoker:  1 pack/month x 17 years    Alcohol use-no (past, clean x 4 years)    Drug use-no   Risk Factors:  Tobacco use:  current    Counseled to quit/cut down tobacco use:  yes Drug use:  no Alcohol use:  no   Review of Systems      See HPI   Physical Exam  General:     Lethargic/groggy appearing thin AAM NAD Somewhat disheveled Head:     normocephalic and atraumatic.   Eyes:     pupils equal, pupils round, and pupils reactive to light.  anicteric, no injection Mouth:     Dry lips, poor dentition.  clear oropharynx.  Neck:     supple, no masses, no thyromegaly, no JVD, no carotid bruits, and no cervical lymphadenopathy.   Lungs:     normal respiratory effort, no accessory muscle use, normal breath sounds, no crackles, and no wheezes.   Heart:     normal rate, regular rhythm, no murmur, no gallop, no rub, and no JVD.   Abdomen:     soft, non-tender, normal bowel sounds, no distention, no hepatomegaly, and no splenomegaly.   Extremities:     no edema Neurologic:      lethargic but alert & oriented X3, cranial nerves II-XII intact, strength normal in all extremities, sensation intact to light touch, and gait normal.   Skin:     no rashes.   Psych:     Oriented X3, flat affect, poor eye contact and somewhat dismissive.    Impression & Recommendations:  Problem # 1:  HYPERTENSION (ICD-401.9) Although patient adamantly denied non-adherence, bottles brought in were for 1 month supply and filled 04/27/08.  Pharmacy called and confirmed that was the last fill date.  Pills counted and bottles were full, except for 2 clonidines or so.  When confronted with all this information, pt agreed to not taking any of the medicaitons.  That said, his BP was substantially high on arrival.  He took his HCTZ and clonidine while waiting.  By the time he left his BP was 142/110 and during the end of the visit, pt was much more alert.  Given he was completely non-adherent to regimen, will change HCTZ to 25mg  (not 50mg ), keep lisinopril 20mg  (with room for increase) and keep the amlodinpine at 10mg .  Will d/c hydralazine (very cumbersome to take qid) and clonidine.  Will check basic labs and ECG (already had nl TSH).  Will bring back in 1 week to reassess BP and recheck BMET (Cr and K).  If still elevated, titrate lisinopril and bring back in 1 week (recheck BP and BMET) If still elevated, consider coreg ($4, two times a day dosing) and titrate to max dose If still elevated, consider spironolactone vs clonidine (depending on compliance and follow up)  The following medications were removed from the medication list:    Clonidine Hcl 0.2 Mg Tabs (Clonidine hcl) .Marland Kitchen... 1 tablet by mouth daily    Hydralazine Hcl 50 Mg Tabs (Hydralazine hcl) .Marland Kitchen... Take 1 tablet by mouth every 6 hours  His updated medication list for this problem includes:    Hydrochlorothiazide 25 Mg Tabs (Hydrochlorothiazide) .Marland Kitchen... Take 1 tablet by mouth once a day    Amlodipine Besylate 10 Mg Tabs (Amlodipine  besylate) .Marland Kitchen... 1 tablet by mouth daily  Lisinopril 20 Mg Tabs (Lisinopril) .Marland Kitchen... 1 tablet by mouth daily  Orders: T-CBC No Diff [id number]) T-Urinalysis Dipstick only [id number]) T-Comprehensive Metabolic Panel ([id number]) 12 Lead EKG (12 Lead EKG) T-Lipid Profile [id number]) T-Drug Screen-Urine, (single) [id number])  BP today: 212/133   Problem # 2:  TOBACCO ABUSE (ICD-305.1) Encouraged smoking cessation and discussed different methods for smoking cessation.   Complete Medication List: 1)  Hydrochlorothiazide 25 Mg Tabs (Hydrochlorothiazide) .... Take 1 tablet by mouth once a day 2)  Amlodipine Besylate 10 Mg Tabs (Amlodipine besylate) .Marland Kitchen.. 1 tablet by mouth daily 3)  Lisinopril 20 Mg Tabs (Lisinopril) .Marland Kitchen.. 1 tablet by mouth daily   Patient Instructions: 1)  Please schedule a follow-up appointment in 1 week. 2)  Make sure to take your medications listed below. 3)  Take 1/2 tablet of the hydrochlorothiazide 50mg  daily, when you run out, pick up the new prescription for 25mg  tablets (take 1 tablet daily) 4)  Start the amlodipine 10mg  by mouth daily. 5)  Start the lisinopril 20mg  by mouth daily. 6)  Stop the hydralazine 7)  Stop the clonidine. 8)  Will check blood work today and EKG and will discuss results on your follow up visit. 9)  It is very important for you to take your medications to control your blood pressure, since this could lead to stroke, heart attack and kidney failure if you don't.  It is also important for [name] to know what you are really taking so that we don't continue to add medications if you are not taking the ones we are already prescribing.    Prescriptions: HYDROCHLOROTHIAZIDE 25 MG TABS (HYDROCHLOROTHIAZIDE) Take 1 tablet by mouth once a day  #30 x 3   Entered and Authorized by:   Alphia Moh MD   Signed by:   Alphia Moh MD on 06/24/2008   Method used:   Electronically to        [facility]. [phone]* (retail)       [address], [place]  [address]       Ph: [phone] or [phone]       Fax: [phone]   RxID:   [id number]  ] Laboratory Results   Urine Tests  Date/Time Received: 06-24-2008  12:15 Date/Time Reported: 06-24-2008 1:54  Routine Urinalysis   Color: yellow- dark Appearance: Hazy Glucose: negative   (Normal Range: Negative) Bilirubin: small   (Normal Range: Negative) Ketone: negative   (Normal Range: Negative) Spec. Gravity: >=1.030   (Normal Range: 1.003-1.035) Blood: large   (Normal Range: Negative) pH: 5.5   (Normal Range: 5.0-8.0) Protein: >=300   (Normal Range: Negative) Urobilinogen: 0.2   (Normal Range: 0-1) Nitrite: negative   (Normal Range: Negative) Leukocyte Esterace: negative   (Normal Range: Negative)

## 2010-07-20 NOTE — Progress Notes (Signed)
Summary: phone note/gp  Phone Note Call from Patient   Summary of Call: Pt. and his significant - other here at the clinic for blood work. They request no info. to be  given to anyone except for the pt. and Peter Cox.  Cell # is [phone].  They also wanted to know why he had to come back in for lab.   Apparently, this info was given to pt.'s daughter (the pt. knew this but not Ms. [name].) Ms. Jones did most of the talking but the pt. agreed with her. Initial call taken by: Morrison Old RN,  August 07, 2009 11:01 AM

## 2010-07-20 NOTE — Assessment & Plan Note (Signed)
Summary: blisters on feet/gg   Vital Signs:  Patient profile:   50 year old male Height:      67 inches (170.18 cm) Weight:      123.8 pounds (56.27 kg) BMI:     19.46 Temp:     97.2 degrees F (36.22 degrees C) oral Pulse rate:   80 / minute BP sitting:   134 / 96  (right arm)  Vitals Entered By: Silverio Decamp NT II (Oct 28, 2008 8:38 AM) CC: ERFOLLOW-UP Is Patient Diabetic? No Pain Assessment Patient in pain? yes     Location: feet Intensity: 5 Type: aching Onset of pain  Gradual Nutritional Status BMI of < 19 = underweight  Have you ever been in a relationship where you felt threatened, hurt or afraid?No   Does patient need assistance? Functional Status Self care Ambulation Normal   Primary Care Provider:  Alphia Moh MD  CC:  ERFOLLOW-UP.  History of Present Illness: Peter Cox is a 50 y/o man with HTN, HLPD and recently dx'd HIV who presents to the opc for an ER f/u. She was seen in ED 3 days ago for blisters on the soles of his feet.  His partner of 20 years, Ms. Dirk Dress, is present with him today and she answers most of the questions since Peter Cox has had some cognitive decline after having "several strokes".  About 2 weeks prior to this visit is when they first noticed multiple blisters on the soles of both feet. (Thus started after Atripla was initiated). He had been wearing tennis shoes when outside the house and walked around barefoot at home.  Has never had such issues before. Pt complains that one of the blisters is painful (on the medial side of his R foot) b/c his shoe rubs on it.  Ms. Broadus John feels that there was some purulent discharge when one of the blisters broke but there hasn't been any surrounding redness or swelling. There initially was some desquamation which resolved after they started applying a steroid cream that was prescribed in the ED. At that time, they were told it was fungus. Given oral Lamisil and the steroid cream. There has been  no change in terms of the number of blisters since then. No fevers or chills. No rashes elsewhere. RPR negative 07/2008.  Preventive Screening-Counseling & Management     Alcohol drinks/day: 0     Smoking Status: current     Smoking Cessation Counseling: yes     Packs/Day: 0.25     Year Started: 7 CIGARETTES PER DAY     Does Patient Exercise: yes     Type of exercise: WALKING     Times/week:  2-3  Allergies: No Known Drug Allergies  Past History:  Past Medical History:    HIV dx 08/08/2008 (unknown how he was exposed)         08/08/08:  RPR negative         08/28/08:  Hep B & C, GC, Chlamydia         TB skin test ordered but not done.          08/28/08:  pneumovax given     HTN    tobacco abuse    Marijuana use    Previous cocaine, heavy, now clean since about 2000.    Previous alcohol, heavy, now clean since about 2005.    Left shoulder post-traumatic OA    Mild cognitive impairment (NL TSH, B12, folate):  Likely vascular dementia/psa/?  HIV component         CT head 02/12/08 for hypertensive crisis and concern for stroke:         IMPRESSION:           1.  Old right internal capsule and thalamic lacunar infarcts.           2.  Mild to moderate chronic small vessel white matter ischemic                changes in both cerebral hemispheres, greater on the right.  Social History:    Lives in [place] with male friend Dirk Dress), they have been together x 20 yrs but are no longer sexually active.    Not working    Current Smoker:  1/2  pack/month x 17 years (more in past)    Alcohol use-no (past, clean x 4 years)    Drug use - yes, THC  Review of Systems General:  Denies chills and fever. Eyes:  Denies blurring and double vision. CV:  Denies difficulty breathing at night, difficulty breathing while lying down, and shortness of breath with exertion. Resp:  Denies cough, shortness of breath, sputum productive, and wheezing. GI:  Denies change in bowel habits, nausea, and  vomiting. MS:  Denies joint pain, joint redness, and joint swelling. Derm:  Denies itching. Neuro:  Complains of memory loss; denies falling down. Heme:  Denies abnormal bruising and bleeding. Allergy:  Denies seasonal allergies and sneezing.  Physical Exam  General:  Very thin middle-aged man in NAD. He appears withdrawn and lets his partner do most of the talking. Head:  atraumatic.   Eyes:  vision grossly intact, pupils equal, pupils round, pupils reactive to light, and no injection. Bilateral pinguecula. Ears:  no external deformities.   Nose:  no external deformity.   Mouth:  Poor dentition with several teeth extracted. OP clear, no thrush. Neck:  Very small shotty lymphADNP. Supple, full ROM. Lungs:  normal respiratory effort, no intercostal retractions, no accessory muscle use, normal breath sounds, no crackles, and no wheezes.   Heart:  normal rate, regular rhythm, and no murmur.   Abdomen:  soft, non-tender, normal bowel sounds, no distention, no masses, no guarding, and no rigidity.   Msk:  no joint swelling and no joint warmth.   Extremities:  Symmetrical blisters on soles of feet (small grouped vesicles in mid foot area with a bigger vesicle on medial aspect of foot). No e/c/c. Neurologic:  Pt is awake and alert although he is very withdrawn. cranial nerves II-XII intact and gait normal.   Skin:  no rashes, no petechiae, no purpura, and no ulcerations.   Psych:  Pt doesn't interact much, he keeps his head down and when questioned, he takes a while to answer and uses very few words. Per his partner, he has become this way after his strokes. Additional Exam:  A R foot medial vesicle was lanced under Dr. Lucianne Lei Dam's supervision.  Skin cleaned with iodine and alcohol. Area numbed with numbing spray. A point incision was done and serosanguinous fluid was extracted. No pus. No significant bleeding. no complications.   Impression & Recommendations:  Problem # 1:  FOOT&TOE BLISTER  WITHOUT MENTION OF INFECTION (ICD-917.2) Pt seen with Dr. Drucilla Schmidt. Atripla allergy usually causes a diffuse rash so blisters on the soles are not typical. Unlikely to be a manifestation of 2nd syphilis (especially since his RPR was negative in 07/2008). Shoes don't seem to be too tight.  Started  on oral Lamisil and a topical cream in the ED for presumed tinea. Although he does have toenail onychomycosis, will d/c the Lamisil in order to avoid hepatic toxicity. Will switch his ARV to Norvir, Prezista and Truvada in case this is a reaction to Atripla.   Pt to continue wearing clean white socks and shoes that fit him appropriately.   Orders: T-Syphilis Test (RPR) [phone])  Problem # 2:  HIV INFECTION (ICD-042) Recently diagnosed. CD4 count 320 in 08/2008. Started on Atripla at that time. Gets his meds at [facility]. Has AARP/Medicare Complete and Medicaid. Again, wil d/c the Atripla and switch to Norvir, Prezista and Truvada (see above). Will check a Cd4 and viral load today. Depending on what the numbers are, might add on a genotype. F/u with Dr. Tomma Lightning scheduled in 11/2008.  Orders: T-CD4 [id number]) T-Viral Load [id number])  Problem # 3:  MEMORY LOSS (ICD-780.93) Per pt's partner, this is 2/2 his several strokes. There might be some HIV dementia component as well.  Problem # 4:  UNSPECIFIED DISORDER TEETH&SUPPORTING STRUCTURES (ICD-525.9) Patient had several teeth extracted and is scheduled to get additional ones extracted as well.  Problem # 5:  DYSLIPIDEMIA (ICD-272.4) Will need a repeat FLP on his next visit.  His updated medication list for this problem includes:    Pravachol 40 Mg Tabs (Pravastatin sodium) .Marland Kitchen... Take 1 tab by mouth at bedtime  Labs Reviewed: SGOT: 13 (08/28/2008)   SGPT: 12 (08/28/2008)   HDL:35 (09/01/2008), 34 (06/24/2008)  LDL:122 (09/01/2008), 150 (06/24/2008)  Chol:176 (09/01/2008), 198 (06/24/2008)  Trig:96 (09/01/2008), 72 (06/24/2008)  Problem # 6:   HYPERTENSION (ICD-401.9) Esentially at goal.  His updated medication list for this problem includes:    Hydrochlorothiazide 25 Mg Tabs (Hydrochlorothiazide) .Marland Kitchen... Take 1 tablet by mouth once a day    Amlodipine Besylate 10 Mg Tabs (Amlodipine besylate) .Marland Kitchen... 1 tablet by mouth daily    Lisinopril 40 Mg Tabs (Lisinopril) .Marland Kitchen... Take 1 tablet by mouth once a day  BP today: 134/96 Prior BP: 117/79 (10/21/2008)  Labs Reviewed: K+: 3.9 (08/28/2008) Creat: : 1.25 (08/28/2008)   Chol: 176 (09/01/2008)   HDL: 35 (09/01/2008)   LDL: 122 (09/01/2008)   TG: 96 (09/01/2008)  Complete Medication List: 1)  Hydrochlorothiazide 25 Mg Tabs (Hydrochlorothiazide) .... Take 1 tablet by mouth once a day 2)  Amlodipine Besylate 10 Mg Tabs (Amlodipine besylate) .Marland Kitchen.. 1 tablet by mouth daily 3)  Lisinopril 40 Mg Tabs (Lisinopril) .... Take 1 tablet by mouth once a day 4)  Pravachol 40 Mg Tabs (Pravastatin sodium) .... Take 1 tab by mouth at bedtime 5)  Prezista 400 Mg Tabs (Darunavir ethanolate) .... Take 2 tabs by mouth once daily. 6)  Lamisil 250 Mg Tabs (Terbinafine hcl) .... Take 1 tablet by mouth once a day 7)  Norvir 100 Mg Caps (Ritonavir) .... Take 1 tab by mouth once daily. 8)  Truvada 200-300 Mg Tabs (Emtricitabine-tenofovir) .... Take 1 tab by mouth once daily.  Patient Instructions: 1)  STOP TAKING LAMISIL. 2)  Stop taking Atripla. We have changed you to 1 tab of Norvir by mouth once daily, 1 tab of Truvada by mouth once daily and 2 tabs of Prezista by mouth once daily. 3)  Keep your appointment with Dr. Tomma Lightning in June. Prescriptions: TRUVADA 200-300 MG TABS (EMTRICITABINE-TENOFOVIR) Take 1 tab by mouth once daily.  #30 x 5   Entered and Authorized by:   Burton Apley MD   Signed by:   Burton Apley MD on  10/28/2008   Method used:   Print then Give to Patient   RxID:   [id number] NORVIR 100 MG CAPS (RITONAVIR) Take 1 tab by mouth once daily.  #30 x 5   Entered and Authorized by:    Burton Apley MD   Signed by:   Burton Apley MD on 10/28/2008   Method used:   Print then Give to Patient   RxID:   [id number] PREZISTA 400 MG TABS (DARUNAVIR ETHANOLATE) Take 2 tabs by mouth once daily.  #60 x 5   Entered and Authorized by:   Burton Apley MD   Signed by:   Burton Apley MD on 10/28/2008   Method used:   Print then Give to Patient   RxID:   [id number]

## 2010-07-20 NOTE — Miscellaneous (Signed)
Summary: Orders Update  Clinical Lists Changes  Orders: Added new Referral order of Social Work Referral (Social ) - Signed Added new Referral order of Social Work Referral (Social ) - Signed

## 2010-07-20 NOTE — Assessment & Plan Note (Signed)
Summary: 62MONTH F/U/EST/VS   Vital Signs:  Patient profile:   50 year old male Height:      67 inches (170.18 cm) Weight:      125.4 pounds (57 kg) BMI:     19.71 Temp:     97.0 degrees F (36.11 degrees C) oral Pulse rate:   87 / minute BP sitting:   117 / 79  (right arm)  Vitals Entered By: Nadine Counts Deborra Medina) (Oct 21, 2008 2:19 PM) CC: routine f/u and needs medical clearance to have dental extractions Is Patient Diabetic? No Pain Assessment Patient in pain? no      Nutritional Status BMI of < 19 = underweight  Have you ever been in a relationship where you felt threatened, hurt or afraid?Unable to ask   Does patient need assistance? Functional Status Cook/clean Ambulation Normal   CC:  routine f/u and needs medical clearance to have dental extractions.  History of Present Illness: Peter Cox is a 50 yo man with PMH as outlined in chart.  He is here today because he was seen by Dr. Hoyt Koch for dental extraction, however, BP was in the 200s/ 100s and extractions were postponed.  He states he was taking his medications, however, this was an issue the first few visits.    He has recently seen Dr. Tomma Lightning and started ARVs.  He is doing well and again states he is taking his medications.  Asked how this is being done and states his daughter is setting up a pill box for him.    Preventive Screening-Counseling & Management     Alcohol drinks/day: 0     Smoking Status: current     Smoking Cessation Counseling: yes     Packs/Day: 0.25     Year Started: 7 CIGARETTES PER DAY     Does Patient Exercise: yes     Type of exercise: WALKING     Times/week:  2-3  Medications Prior to Update: 1)  Hydrochlorothiazide 25 Mg Tabs (Hydrochlorothiazide) .... Take 1 Tablet By Mouth Once A Day 2)  Amlodipine Besylate 10 Mg Tabs (Amlodipine Besylate) .Marland Kitchen.. 1 Tablet By Mouth Daily 3)  Lisinopril 40 Mg Tabs (Lisinopril) .... Take 1 Tablet By Mouth Once A Day 4)  Pravachol 40 Mg Tabs  (Pravastatin Sodium) .... Take 1 Tab By Mouth At Bedtime 5)  Atripla 600-200-300 Mg Tabs (Efavirenz-Emtricitab-Tenofovir) .... Take 1 Tablet By Mouth At Bedtime  Allergies: No Known Drug Allergies  Past History:  Past Medical History:    HIV dx 08/08/2008         08/08/08:  RPR negative         08/28/08:  Hep B & C, GC, Chlamydia         TB skin test ordered but not done.          08/28/08:  pneumovax given     HTN    tobacco abuse    Marijuana use    Previous cocaine, heavy, now clean since about 2000.    Previous alcohol, heavy, now clean since about 2005.    Left shoulder post-traumatic OA    Mild cognitive impairment (NL TSH, B12, folate):  Likely vascular dementia/psa/? HIV component         CT head 02/12/08 for hypertensive crisis and concern for stroke:         IMPRESSION:           1.  Old right internal capsule and thalamic lacunar infarcts.  2.  Mild to moderate chronic small vessel white matter ischemic                changes in both cerebral hemispheres, greater on the right. (09/15/2008)  Past Surgical History:    Left shoulder arthroscopy w/ extensive debridement (01/2008 by Dr. Mardelle Matte)    Right knee patellar reduction    Hemorrhoidectomy     (06/24/2008)  Family History:    Mother deceased (61yo):  CVA, HTN, DM    Father deceased (40yo):  DM    2 sisters alive:  HTN in both    1 brother alive:  healthy (06/24/2008)  Social History:    Lives in [place] with male friend    Not working    Current Smoker:  1/2  pack/month x 17 years (more in past)    Alcohol use-no (past, clean x 4 years)    Drug use - yes, THC     (08/08/2008)  Risk Factors:    Smoking Status: current (10/21/2008)    Packs/Day: 0.25 (10/21/2008)    Cigars/wk: N/A    Pipe Use/wk: N/A    Cans of tobacco/wk: N/A    Passive Smoke Exposure: N/A  Review of Systems      See HPI  Physical Exam  General:  alert, well-developed, well-nourished, and well-hydrated.   Eyes:  anicteric, no  injection Lungs:  normal respiratory effort, no accessory muscle use, no crackles, and no wheezes.   Heart:  normal rate, regular rhythm, and no murmur.  no rub and no JVD.   Abdomen:  soft, non-tender, normal bowel sounds, and no distention.   Neurologic:  alert & oriented X3, cranial nerves II-XII intact, strength normal in all extremities, and gait normal.   Psych:  Oriented X3, memory intact for recent and remote, normally interactive, good eye contact, not anxious appearing, and not depressed appearing.     Impression & Recommendations:  Problem # 1:  UNSPECIFIED DISORDER TEETH&SUPPORTING STRUCTURES (ICD-525.9) Seen by Dr. Hoyt Koch and extractions postponed 2/2 marked HTN. See below for assessment of HTN. Ok to proceed with extractions.  Problem # 2:  HYPERTENSION (ICD-401.9) BP has been very well controlled past 2 visits. Prior to these, it was trending down nicely. As this was an issue during our initial visits, assume he did not take his medicines for a few days preceeding his visit to Dr. Hoyt Koch. Given current reading, well within goal, I will not make any changes. Have discussed need to take his medicaitons on a daily basis including day of surgery, EXCEPT FOR LISINOPRIL (RESUME POST OP). His updated medication list for this problem includes:    Hydrochlorothiazide 25 Mg Tabs (Hydrochlorothiazide) .Marland Kitchen... Take 1 tablet by mouth once a day    Amlodipine Besylate 10 Mg Tabs (Amlodipine besylate) .Marland Kitchen... 1 tablet by mouth daily    Lisinopril 40 Mg Tabs (Lisinopril) .Marland Kitchen... Take 1 tablet by mouth once a day  BP today: 117/79 Prior BP: 126/79 (10/07/2008)  Labs Reviewed: K+: 3.9 (08/28/2008) Creat: : 1.25 (08/28/2008)   Chol: 176 (09/01/2008)   HDL: 35 (09/01/2008)   LDL: 122 (09/01/2008)   TG: 96 (09/01/2008)  Problem # 3:  HIV INFECTION (ICD-042) Seen by Dr. Tomma Lightning 10/07/08. Started ARV (atripla). Doing well without side effects. States he is taking it on a daily basis. I  reiterated the importance of compliance and consequences of not doing so. Will recheck counts in 3 months from previous values. Management per Dr. Tomma Lightning.   CD4 320 (08/29/08)  Viral load 43,200 (08/28/08)  Complete Medication List: 1)  Hydrochlorothiazide 25 Mg Tabs (Hydrochlorothiazide) .... Take 1 tablet by mouth once a day 2)  Amlodipine Besylate 10 Mg Tabs (Amlodipine besylate) .Marland Kitchen.. 1 tablet by mouth daily 3)  Lisinopril 40 Mg Tabs (Lisinopril) .... Take 1 tablet by mouth once a day 4)  Pravachol 40 Mg Tabs (Pravastatin sodium) .... Take 1 tab by mouth at bedtime 5)  Atripla 600-200-300 Mg Tabs (Efavirenz-emtricitab-tenofovir) .... Take 1 tablet by mouth at bedtime  Patient Instructions: 1)  Please schedule a follow-up appointment in 3 months. 2)  We will check labs when you come in to get your counts checked. 3)  Make sure to keep taking your medications. 4)  Your blood pressure has been great the last 2 visits. 5)  We will let Dr. Hoyt Koch know that your blood pressure has been well controlled here and that it is ok to proceed with surgery. 6)  Make sure to take your medications with just a little bit of water on the day of surgery, DO NOT TAKE THE LISINOPRIL THAT MORNING. 7)  If you have any problems before your next visit, call clinic.

## 2010-07-20 NOTE — Progress Notes (Signed)
Summary: med change/gp--no need to change med.  Phone Note From Pharmacy   Caller[facility] of Call: [facility] states Prezista is not covered by his insurance and costs about $1000.00.  [facility] # [id number]. Thanks Initial call taken by: Morrison Old RN,  Nov 05, 2008 8:57 AM  Follow-up for Phone Call        I am sending this to [facility]. I think his insurance is screwed up. They probably dont realize he has been taken off of Atripla. Verdis Frederickson can you call and figure out what is REALLY going on? IF they dont cover prezista will they cover reyataz, norvir truvada,  raltegravir truvada or [place]. This pt may have had a rash to atripla that did not go away (it may be unrelated too,) but would prefer for him to be on different regimen Follow-up by: Alcide Evener MD,  Nov 05, 2008 9:32 AM  Additional Follow-up for Phone Call Additional follow up Details #1::        Called patient's pharmacy and Autumn, did a test on the Reyataz 300mg  in which also came back not covered. I then had Autumn to run test claim on the Kaletra 200/50mg  Tablets #120 in which his insurance will cover.  Would you like to switch patient's regimen to Kaletra and Truvada because both are covered under his plan for a low cost. NO more than $3 Additional Follow-up by: Canary Brim  BS,CPht II,MPH,  Nov 07, 2008 10:09 AM    Additional Follow-up for Phone Call Additional follow up Details #2::    spoke to patients insurance company representative, Sonia Baller, who stated that pharmacy was processing his claim with a Unit Dose package instead of a regular bottle of 60. Therefore, a prior authorization was not needed nor was the drug not covered at all.  I spoke to Autumn a patient's pharmacy, and told her to process the claim under a regular bottle of 60 tabs instead of Unit Dose.  The claim was approved and they will order medication for patient in which will arrive on Monday.  There is no need to change patient's regimen unless  you would like to. Follow-up by: Canary Brim  BS,CPht II,MPH,  Nov 07, 2008 2:14 PM    Appended Document: med change/gp--no need to change med. No I do not want to change back from prezista. Thanks for fixing this Verdis Frederickson. Note I was skeptical of atripla rash but did not want to take chances.

## 2010-07-20 NOTE — Miscellaneous (Signed)
Summary: RW  Clinical Lists Changes  Observations: Added new observation of PAT[place]COUNT[place]: [place] (10/01/2008 17:08) Added new observation of RACE: African American (10/01/2008 17:08) Added new observation of LATINO/HISP: No (10/01/2008 17:08) Added new observation of MARITAL STAT: Single (10/01/2008 17:08) Added new observation of HIV RISK BEH: Heterosexual contact (10/01/2008 17:08) Added new observation of HIV INIT DX: 07/21/2008 (10/01/2008 17:08) Added new observation of DATE1STVISIT: 09/18/2008 (10/01/2008 17:08) Added new observation of GENDER: Male (10/01/2008 17:08) Added new observation of RW VITAL STA: Active (10/01/2008 17:08) Added new observation of RWPARTICIP: Yes (10/01/2008 17:08)

## 2010-07-20 NOTE — Miscellaneous (Signed)
Summary: Privacy  15 minutes.  Discussed case with mamie hague.  I had received flag from [facility] in Triage that daughter wanting Peter Cox seen for multiple concerns about med nonadherence, money mgmt.   After reading chart, it seems that Peter Cox had specified disclosure to his fiancee only in previous phone notes.   It is unclear if that has changed so I am alerting team to confidentiality issues that may come up at Thursday appointment.   Mamie is aware of situation and we will get clarification from patient at next appointment.  Routing to Dr. Stanford Scotland who sees patient on Thursday.

## 2010-07-20 NOTE — Assessment & Plan Note (Signed)
Summary: see note/pcp-alvarez/hla   Vital Signs:  Patient profile:   50 year old male Height:      67 inches Weight:      127.2 pounds BMI:     19.99 Temp:     97.0 degrees F oral Pulse rate:   61 / minute BP sitting:   215 / 108  (right arm)  Vitals Entered By: Silverio Decamp NT II (February 04, 2010 8:40 AM) CC: abdominal pain Is Patient Diabetic? No Pain Assessment Patient in pain? no      Nutritional Status BMI of < 19 = underweight  Have you ever been in a relationship where you felt threatened, hurt or afraid?No   Does patient need assistance? Functional Status Self care Ambulation Normal   Primary Care Provider:  Alphia Moh MD  CC:  abdominal pain.  History of Present Illness: Patient comes in with his daughter who states "that she wants to know what is going on with her daddy," and "sort his medications out." States that the patient's behaviour changed after returning from a vacation in [place] in January of 2011 with his fiance. Apparently, he had hemorroid surgery done in [place] prior to return to [place]. The daughter states that he behaves "like a helpless baby." She has to remind him of brushing his teeth, take showers and eat. States that he "laughs off" any questions; that he drinks about 4-6 beers a night and "parties all the time." She found out that he was not taking his medications for  'at least one month." Neither the patient or his daughter were able to verify what medications he is suppose to take. The daughter states"that she is in a process of obtaining a health proxy status" on behalf of her father. Patient states that he is"depressed, without any energy, and sad because his girl-friend left him." He denies SI/HI or mania. He welcomes his daughter's concerns "but does not wish for her to know that he has HIV."  Hypertension History:      Positive major cardiovascular risk factors include male age 74 years old or older, hyperlipidemia, hypertension,  and current tobacco user.     Preventive Screening-Counseling & Management  Alcohol-Tobacco     Alcohol drinks/day: 0     Smoking Status: current     Smoking Cessation Counseling: yes     Packs/Day: 0.5     Year Started: < 7 CIG A DAY  Caffeine-Diet-Exercise     Does Patient Exercise: yes     Type of exercise: WALKING     Times/week:  2-3  Problems Prior to Update: 1)  Influenza Like Illness  (ICD-487.1) 2)  Anemia  (ICD-285.9) 3)  Renal Insufficiency  (ICD-588.9) 4)  Foot&toe Blister Without Mention of Infection  (ICD-917.2) 5)  Unspecified Disorder Teeth&supporting Structures  (ICD-525.9) 6)  Positive Ppd  (ICD-795.5) 7)  HIV Infection  (ICD-042) 8)  Substance Abuse, Multiple  (ICD-305.90) 9)  Memory Loss  (ICD-780.93) 10)  Dyslipidemia  (ICD-272.4) 11)  Arthritis  (ICD-716.90) 12)  Tobacco Abuse  (ICD-305.1) 13)  Hypertension  (ICD-401.9)  Current Problems (verified): 1)  Influenza Like Illness  (ICD-487.1) 2)  Anemia  (ICD-285.9) 3)  Renal Insufficiency  (ICD-588.9) 4)  Foot&toe Blister Without Mention of Infection  (ICD-917.2) 5)  Unspecified Disorder Teeth&supporting Structures  (ICD-525.9) 6)  Positive Ppd  (ICD-795.5) 7)  HIV Infection  (ICD-042) 8)  Substance Abuse, Multiple  (ICD-305.90) 9)  Memory Loss  (ICD-780.93) 10)  Dyslipidemia  (ICD-272.4) 11)  Arthritis  (ICD-716.90) 12)  Tobacco Abuse  (ICD-305.1) 13)  Hypertension  (ICD-401.9)  Medications Prior to Update: 1)  Amlodipine Besylate 10 Mg Tabs (Amlodipine Besylate) .Marland Kitchen.. 1 Tablet By Mouth Daily 2)  Lisinopril 40 Mg Tabs (Lisinopril) .... Take 1 Tablet By Mouth Once A Day. Please Hold This Med For Now As Cr Goes Up. 3)  Pravachol 40 Mg Tabs (Pravastatin Sodium) .... Take 1 Tab By Mouth At Bedtime 4)  Prezista 400 Mg Tabs (Darunavir Ethanolate) .... Take 2 Tabs By Mouth Once Daily. 5)  Norvir 100 Mg Caps (Ritonavir) .... Take 1 Tab By Mouth Once Daily. 6)  Truvada 200-300 Mg Tabs  (Emtricitabine-Tenofovir) .... Take 1 Tab By Mouth Once Daily. 7)  Bayer Aspirin Ec Low Dose 81 Mg Tbec (Aspirin) .... Take 1 Tablet By Mouth Once A Day 8)  Tylenol Arthritis Pain 650 Mg Cr-Tabs (Acetaminophen) .... Take 1 Tablet By Mouth Three Times A Day As Needed  Allergies (verified): 1)  * Tb Skin Test Some Skin Reaction  Past History:  Past Medical History: Last updated: 11/19/2008 HIV dx 08/08/2008 (unknown how he was exposed)      Initial viral load:  43,200 (08/28/08)      Current Viral Load:  576       Initial CD4:  320 (08/28/08)      Current Cd4:  410           10/28/08 and 08/08/08:  RPR negative           08/28/08:  Hep B & C, GC, Chlamydia           TB skin test ordered but not done.            08/28/08:  pneumovax given  HTN tobacco abuse Marijuana use Previous cocaine, heavy, now clean since about 2000. Previous alcohol, heavy, now clean since about 2005. Left shoulder post-traumatic OA Mild cognitive impairment (NL TSH, B12, folate):  Likely vascular dementia/psa/? HIV component      CT head 02/12/08 for hypertensive crisis and concern for stroke:      IMPRESSION:        1.  Old right internal capsule and thalamic lacunar infarcts.        2.  Mild to moderate chronic small vessel white matter ischemic             changes in both cerebral hemispheres, greater on the right.  Past Surgical History: Last updated: 07-04-08 Left shoulder arthroscopy w/ extensive debridement (01/2008 by Dr. Mardelle Matte) Right knee patellar reduction Hemorrhoidectomy  Family History: Last updated: 07-04-2008 Mother deceased (23yo):  CVA, HTN, DM Father deceased (55yo):  DM 2 sisters alive:  HTN in both 1 brother alive:  healthy  Social History: Last updated: 10/28/2008 Lives in [place] with male friend Dirk Dress), they have been together x 20 yrs but are no longer sexually active. Not working Current Smoker:  1/2  pack/month x 17 years (more in past) Alcohol use-no (past, clean x 4  years) Drug use - yes, THC  Risk Factors: Alcohol Use: 0 (02/04/2010) Exercise: yes (02/04/2010)  Risk Factors: Smoking Status: current (02/04/2010) Packs/Day: 0.5 (02/04/2010)  Review of Systems  The patient denies anorexia, fever, weight loss, weight gain, vision loss, decreased hearing, hoarseness, chest pain, syncope, dyspnea on exertion, peripheral edema, prolonged cough, headaches, hemoptysis, abdominal pain, melena, hematochezia, severe indigestion/heartburn, hematuria, incontinence, genital sores, muscle weakness, suspicious skin lesions, transient blindness, difficulty walking, abnormal bleeding, enlarged lymph nodes, angioedema, and  testicular masses.    Physical Exam  General:  alert, well-developed,  and well-hydrated.  underweight appearing.   Head:  normocephalic, atraumatic, and no abnormalities observed.   Eyes:  No corneal or conjunctival inflammation noted. EOMI. Perrla. Funduscopic exam benign, without hemorrhages, exudates or papilledema. Vision grossly normal. Ears:  External ear exam shows no significant lesions or deformities.  Otoscopic examination reveals clear canals, tympanic membranes are intact bilaterally without bulging, retraction, inflammation or discharge. Hearing is grossly normal bilaterally. Nose:  External nasal examination shows no deformity or inflammation. Nasal mucosa are pink and moist without lesions or exudates. Mouth:  no gingival abnormalities, pharynx pink and moist, no erythema, no exudates, no lesions, no aphthous ulcers, poor dentition, and teeth missing.   Neck:  No deformities, masses, or tenderness noted. Chest Wall:  No deformities, masses, tenderness or gynecomastia noted. Lungs:  Normal respiratory effort, chest expands symmetrically. Lungs are clear to auscultation, no crackles or wheezes. Heart:  Normal rate and regular rhythm. S1 and S2 normal without gallop, murmur, click, rub or other extra sounds. Abdomen:  Bowel sounds  positive,abdomen soft and non-tender without masses, organomegaly or hernias noted. Rectal:  refused Genitalia:  refused Msk:  No deformity or scoliosis noted of thoracic or lumbar spine.   Pulses:  R and L carotid,radial,femoral,dorsalis pedis and posterior tibial pulses are full and equal bilaterally Extremities:  No clubbing, cyanosis, edema, or deformity noted with normal full range of motion of all joints.   Neurologic:  No cranial nerve deficits noted. Station and gait are normal. Plantar reflexes are down-going bilaterally. DTRs are symmetrical throughout. Sensory, motor and coordinative functions appear intact. Skin:  Intact without suspicious lesions or rashes Cervical Nodes:  No lymphadenopathy noted Axillary Nodes:  No palpable lymphadenopathy Psych:  Good judgmentOriented X3, memory intact for recent and remote, not anxious appearing, not agitated, not suicidal, not homicidal, subdued, poor eye contact, and poor concentration.   Mini-mental exam is at 28 points.   Impression & Recommendations:  Problem # 1:  HIV INFECTION (ICD-042) Patient is noncompliant with his regimen. Will check his labws today and set up a follow up appointment with Dr. Tommy Medal in [facility]. Improtance of a follow up was emphasized with the patient. Orders: T-CBC No Diff [id number]) T-HIV Viral Load [id number]) T-CD4PRO ([facility]) (CD4PRO)  Problem # 2:  HYPERTENSION (ICD-401.9) Uncontrolled due to lack of adherance with a Tx regimen.He is asymptomatic.  Risks of HTN, including retinopathy, nephropathy, CVA, CAD and death were explained to the patient. Low salt-diet and exercise discusssed. Will recheck in 1 wk. His updated medication list for this problem includes:    Amlodipine Besylate 10 Mg Tabs (Amlodipine besylate) .Marland Kitchen... 1 tablet by mouth daily    Lisinopril 40 Mg Tabs (Lisinopril) .Marland Kitchen... Take 1 tablet by mouth once a day. please hold this med for now as cr goes up.  Orders: T-CMP with  Estimated GFR ([id number])  BP today: 215/108 Prior BP: 153/98 (07/29/2009)  10 Yr Risk Heart Disease: 27 %  Labs Reviewed: K+: 4.4 (08/07/2009) Creat: : 1.28 (08/07/2009)   Chol: 176 (09/01/2008)   HDL: 35 (09/01/2008)   LDL: 122 (09/01/2008)   TG: 96 (09/01/2008)  Problem # 3:  DYSLIPIDEMIA (ICD-272.4) Restart Pravachol. Diet reviewed. His updated medication list for this problem includes:    Pravachol 40 Mg Tabs (Pravastatin sodium) .Marland Kitchen... Take 1 tab by mouth at bedtime  Orders: T-Lipid Profile [id number])  Labs Reviewed: SGOT: 16 (11/19/2008)   SGPT: 19 (  11/19/2008)  10 Yr Risk Heart Disease: 27 %   HDL:35 (09/01/2008), 34 (06/24/2008)  LDL:122 (09/01/2008), 150 (06/24/2008)  Chol:176 (09/01/2008), 198 (06/24/2008)  Trig:96 (09/01/2008), 72 (06/24/2008)  Problem # 4:  DEPRESSION, ACUTE (ICD-296.23) Likely multifactorial --due to an HIV diagnosis and being left by his girl-friend of 21 years. Denies SI/HI or mania. Start Celexa. Explained action of the medication and possible side-effects. Instructed to call 911 or go to ED with SI/HI or mania. Patient verbalized understanding. will reevaluate himin 2 weeks. Instructed to call with any questions. Total time spent with the patient: 65 minutes. Referred to Ms. Tessiatore.  Complete Medication List: 1)  Amlodipine Besylate 10 Mg Tabs (Amlodipine besylate) .Marland Kitchen.. 1 tablet by mouth daily 2)  Lisinopril 40 Mg Tabs (Lisinopril) .... Take 1 tablet by mouth once a day. please hold this med for now as cr goes up. 3)  Pravachol 40 Mg Tabs (Pravastatin sodium) .... Take 1 tab by mouth at bedtime 4)  Bayer Aspirin Ec Low Dose 81 Mg Tbec (Aspirin) .... Take 1 tablet by mouth once a day 5)  Tylenol Arthritis Pain 650 Mg Cr-tabs (Acetaminophen) .... Take 1 tablet by mouth three times a day as needed 6)  Citalopram Hydrobromide 10 Mg Tabs (Citalopram hydrobromide) .... Take 1 tablet by mouth once a day  Other Orders: T-TSH  [id number])  Hypertension Assessment/Plan:      The patient's hypertensive risk group is category B: At least one risk factor (excluding diabetes) with no target organ damage.  His calculated 10 year risk of coronary heart disease is 27 %.  Today's blood pressure is 215/108.    Patient Instructions: 1)  Please, take all your medications as prescribed. 2)  Please, follow up with Dr. Stanford Scotland in 2 weeks. 3)  Call with any questions. Prescriptions: BAYER ASPIRIN EC LOW DOSE 81 MG TBEC (ASPIRIN) Take 1 tablet by mouth once a day  #31 x 11   Entered and Authorized by:   Milana Obey MD   Signed by:   Milana Obey MD on 02/04/2010   Method used:   Electronically to        [facility] (retail)       [place], [place]  [address]       Ph: [phone]       Fax: [phone]   [id number] PRAVACHOL 40 MG TABS (PRAVASTATIN SODIUM) Take 1 tab by mouth at bedtime  #30 x 11   Entered and Authorized by:   Milana Obey MD   Signed by:   Milana Obey MD on 02/04/2010   Method used:   Electronically to        [facility] (retail)       [place], [place]  [address]       Ph: [phone]       Fax: [phone]   [id number] LISINOPRIL 40 MG TABS (LISINOPRIL) Take 1 tablet by mouth once a day. Please hold this med for now as Cr goes up.  #30 x 11   Entered and Authorized by:   Milana Obey MD   Signed by:   Milana Obey MD on 02/04/2010   Method used:   Electronically to  [facility] (retail)       [place], [place]  [address]       Ph: [phone]       Fax: [phone]   [id number] AMLODIPINE BESYLATE 10 MG TABS (AMLODIPINE BESYLATE) 1 tablet by mouth daily  #30 x 11   Entered and Authorized by:   Milana Obey MD   Signed by:   Milana Obey MD on 02/04/2010    Method used:   Electronically to        [facility] (retail)       [place], [place]  [address]       Ph: [phone]       Fax: [phone]   [id number] CITALOPRAM HYDROBROMIDE 10 MG TABS (CITALOPRAM HYDROBROMIDE) Take 1 tablet by mouth once a day  #30 x 11   Entered and Authorized by:   Milana Obey MD   Signed by:   Milana Obey MD on 02/04/2010   Method used:   Electronically to        [facility] (retail)       [address]       [place], [place]  [address]       Ph: [phone]       Fax: [phone]   [id number]   Handout requested.   Prevention & Chronic Care Immunizations   Influenza vaccine: Historical  (07/22/2008)    Tetanus booster: Not documented    Pneumococcal vaccine: Pneumovax  (08/28/2008)  Colorectal Screening   Hemoccult: Not documented    Colonoscopy: Not documented  Other Screening   PSA: Not documented   Smoking status: current  (02/04/2010)   Smoking cessation counseling: yes  (02/04/2010)  Lipids   Total Cholesterol: 176  (09/01/2008)   LDL: 122  (09/01/2008)   LDL Direct: Not documented   HDL: 35  (09/01/2008)   Triglycerides: 96  (09/01/2008)    SGOT (AST): 16  (11/19/2008)   SGPT (ALT): 19  (11/19/2008)   Alkaline phosphatase: 116  (11/19/2008)   Total bilirubin: 0.3  (11/19/2008)  Hypertension   Last Blood Pressure: 215 / 108  (02/04/2010)   Serum creatinine: 1.28  (08/07/2009)   Serum potassium 4.4  (08/07/2009)  Self-Management Support :    Patient will work on the following items until the next clinic visit to reach self-care goals:     Medications and monitoring: take my medicines every day  (02/04/2010)     Eating: drink diet soda or water instead of juice or soda, eat more vegetables, use fresh or frozen vegetables, eat foods that are low in salt, eat baked foods instead of fried foods, eat fruit  for snacks and desserts  (02/04/2010)     Other: Per Ms. [name] does not take meds consistently / has no teeth eats soft foods. Needs to get appt. with Dr. Carlota Raspberry on [address] re: teeth  (04/21/2009)    Hypertension self-management support: Education handout, Resources for patients handout  (02/04/2010)   Hypertension education handout printed    Lipid self-management support: Education handout, Resources for patients handout  (02/04/2010)     Lipid education handout printed  Resource handout printed.   Process Orders Check Orders Results:     [facility]: Check successful Tests Sent for requisitioning (February 04, 2010 11:47 AM):     02/04/2010: [facility] -- T-CMP with Estimated GFR [[id number]] (signed)     02/04/2010: [facility] -- T-Lipid Profile [phone] (signed)     02/04/2010: [facility] -- T-CBC No Diff [id number] (signed)     02/04/2010: [facility] -- T-TSH [phone] (signed)     02/04/2010: [facility] -- T-HIV Viral Load [phone] (signed)

## 2010-07-20 NOTE — Assessment & Plan Note (Signed)
Summary: new 042 to id - need records   CC:  new patient.  History of Present Illness: Pt diagnosed HIV positive this year and was referred form the Int Med. clinic.  He was never tested before.  His risk factor is heterosexual sex. He has had a single partner for 20 years.  He is curently staying with his Mother but she is moving to [place] for a year.  He will be staying in her apartment.  He has not told his daughter but his Mother knows his status.  he is currently asymptomatic.  Preventive Screening-Counseling & Management     Alcohol drinks/day: 0     Smoking Status: current     Packs/Day: 0.25   Updated Prior Medication List: HYDROCHLOROTHIAZIDE 25 MG TABS (HYDROCHLOROTHIAZIDE) Take 1 tablet by mouth once a day AMLODIPINE BESYLATE 10 MG TABS (AMLODIPINE BESYLATE) 1 tablet by mouth daily LISINOPRIL 40 MG TABS (LISINOPRIL) Take 1 tablet by mouth once a day PRAVACHOL 40 MG TABS (PRAVASTATIN SODIUM) Take 1 tab by mouth at bedtime ATRIPLA 600-200-300 MG TABS (EFAVIRENZ-EMTRICITAB-TENOFOVIR) Take 1 tablet by mouth at bedtime  Current Allergies (reviewed today): No known allergies  Past History:  Past Medical History:    HIV dx 08/08/2008         08/08/08:  RPR negative         08/28/08:  Hep B & C, GC, Chlamydia         TB skin test ordered but not done.          08/28/08:  pneumovax given     HTN    tobacco abuse    Marijuana use    Previous cocaine, heavy, now clean since about 2000.    Previous alcohol, heavy, now clean since about 2005.    Left shoulder post-traumatic OA    Mild cognitive impairment (NL TSH, B12, folate):  Likely vascular dementia/psa/? HIV component         CT head 02/12/08 for hypertensive crisis and concern for stroke:         IMPRESSION:           1.  Old right internal capsule and thalamic lacunar infarcts.           2.  Mild to moderate chronic small vessel white matter ischemic                changes in both cerebral hemispheres, greater on the  right. (09/15/2008)  Review of Systems  The patient denies anorexia, fever, and weight loss.    Vital Signs:  Patient profile:   50 year old male Height:      67 inches (170.18 cm) Weight:      127.3 pounds (57.86 kg) BMI:     20.01 Temp:     97.9 degrees F (36.61 degrees C) oral Pulse rate:   81 / minute BP sitting:   126 / 79  (right arm)  Vitals Entered By: Rocky Morel) (October 07, 2008 4:01 PM) CC: new patient Is Patient Diabetic? No Pain Assessment Patient in pain? no      Nutritional Status BMI of 19 -24 = normal Nutritional Status Detail good  Have you ever been in a relationship where you felt threatened, hurt or afraid?Unable to ask   Does patient need assistance? Functional Status Self care Ambulation Normal   Physical Exam  General:  alert, well-developed, well-nourished, and well-hydrated.   Head:  normocephalic and atraumatic.   Mouth:  pharynx pink and moist and poor dentition.   Lungs:  normal breath sounds.   Heart:  normal rate, regular rhythm, and no murmur.      Impression & Recommendations:  Problem # 1:  HIV INFECTION (ICD-042) Discussed pathophysiology of HIV and the meaning of CD4ct and VL.  Pt.s current Cd4ct is 32  and VL is 43,200  .  At this Point antiretroviral treatment is indicated.  Discussed treatment options.  Pt will start Atripla.  Potential side effects were discussed.  He will return in 6 weeks for repeat labs .  Discussed safe sex and transmisiion routes with the patient.  Diagnostics Reviewed:  HIV: REACTIVE (08/08/2008)   HIV-Western blot: Positive (08/08/2008)   CD4: 320 (08/29/2008)   WBC: 5.4 (08/28/2008)   PMN (bands): 0 (08/28/2008)   Hgb: 11.2 (08/28/2008)   HCT: 34.5 (08/28/2008)   Platelets: 230 (08/28/2008) HIV-1 RNA: 43200 (08/28/2008)   HBSAg: NEG (08/28/2008)  Orders: New Patient Level III ([id number])Future Orders: T-CD4 [id number]) ... 11/18/2008 T-HIV Viral Load [phone]) ...  11/18/2008 T-Comprehensive Metabolic Panel ([id number]) ... 11/18/2008 T-CBC w/Diff [id number]) ... 11/18/2008  Medications Added to Medication List This Visit: 1)  Atripla 600-200-300 Mg Tabs (Efavirenz-emtricitab-tenofovir) .... Take 1 tablet by mouth at bedtime  Other Orders: Diagnostic X-Ray/Fluoroscopy (Diagnostic X-Ray/Flu)  Patient Instructions: 1)  Please schedule a follow-up appointment in 8 weeks.  Prescriptions: ATRIPLA 600-200-300 MG TABS (EFAVIRENZ-EMTRICITAB-TENOFOVIR) Take 1 tablet by mouth at bedtime  #30 x 5   Entered and Authorized by:   Aldona Bar MD   Signed by:   Aldona Bar MD on 10/07/2008   Method used:   Print then Give to Patient   RxID:   [id number]

## 2010-07-20 NOTE — Assessment & Plan Note (Signed)
Summary: Soc. Work  Soc. Work.   20  minutes.   Consult with team regarding patient needs which include med mgmt and counseling for depression/alcohol dependence.  Daughter is here today to coordinate care but Peter Cox does not want [name] to disclose status.   Peter Cox lives with his daughter.  Met with Peter Cox in exam room and I find a shy AA gentleman who is not yet ready to disclose status but thinking in that direction.  He has been dx with 042 for about a year and has not yet come to terms with his diagnosis.   We discussed the pros and cons of not disclosing and the difficulty of coordinating care for him without his daughter knowing health status however I did reiterate it is his decision if and when to disclose his health status.   I have suggested that he connect with [facility] at his next [place] and I've advised Peter Cox we will let [facility] know that we have visited with him today and that our EMR is a shared record with [facility] and also communicate that he is still not ready to disclose status to his daughter [name].

## 2010-07-20 NOTE — Miscellaneous (Signed)
  Clinical Lists Changes  Observations: Added new observation of PAYOR: Medicare (06/26/2009 15:52)

## 2010-07-20 NOTE — Assessment & Plan Note (Signed)
Summary: est-ck/fu/meds/cfb   Vital Signs:  Patient profile:   50 year old male Height:      67 inches (170.18 cm) Weight:      126.2 pounds (57.36 kg) BMI:     19.84 Temp:     97.3 degrees F (36.28 degrees C) oral Pulse rate:   64 / minute BP sitting:   161 / 102  (left arm)  Vitals Entered By: Gennaro Africa (April 21, 2009 3:07 PM) CC: F/u visit, needs meds refills / didn't bring bottles in  Is Patient Diabetic? No Pain Assessment Patient in pain? no      Nutritional Status BMI of 19 -24 = normal  Have you ever been in a relationship where you felt threatened, hurt or afraid?friend with him   Does patient need assistance? Functional Status Self care Ambulation Normal   Primary Care Provider:  Alphia Moh MD  CC:  F/u visit and needs meds refills / didn't bring bottles in .  History of Present Illness: [name] is a 50 yo man with PMH as outlined in chart.  He is here today for routine follow up.  He is doing well overall.  Continues to smoke cigs and THC.  Prior rash has completely resolved.  He is currently here with his long term friend.  She is states that he has been out of his meds for 2 weeks, all meds.  She has been helping out and trying for him to be more independent with his meds.  However, there are times where he won't take the correct number of pills or confuse them with another.  She has also tried using pill boxes, but again, he may not take them the way he should.    Preventive Screening-Counseling & Management  Alcohol-Tobacco     Alcohol drinks/day: 0     Smoking Status: current     Smoking Cessation Counseling: yes     Packs/Day: 0.5     Year Started: < 7 CIG A DAY  Caffeine-Diet-Exercise     Does Patient Exercise: yes     Type of exercise: WALKING     Times/week:  2-3  Medications Prior to Update: 1)  Hydrochlorothiazide 25 Mg Tabs (Hydrochlorothiazide) .... Take 1 Tablet By Mouth Once A Day 2)  Amlodipine Besylate 10 Mg Tabs  (Amlodipine Besylate) .Marland Kitchen.. 1 Tablet By Mouth Daily 3)  Lisinopril 40 Mg Tabs (Lisinopril) .... Take 1 Tablet By Mouth Once A Day 4)  Pravachol 40 Mg Tabs (Pravastatin Sodium) .... Take 1 Tab By Mouth At Bedtime 5)  Prezista 400 Mg Tabs (Darunavir Ethanolate) .... Take 2 Tabs By Mouth Once Daily. 6)  Norvir 100 Mg Caps (Ritonavir) .... Take 1 Tab By Mouth Once Daily. 7)  Truvada 200-300 Mg Tabs (Emtricitabine-Tenofovir) .... Take 1 Tab By Mouth Once Daily. 8)  Bayer Aspirin Ec Low Dose 81 Mg Tbec (Aspirin) .... Take 1 Tablet By Mouth Once A Day  Allergies (verified): 1)  * Tb Skin Test Some Skin Reaction  Past History:  Past Medical History: Last updated: 11/19/2008 HIV dx 08/08/2008 (unknown how he was exposed)      Initial viral load:  43,200 (08/28/08)      Current Viral Load:  576       Initial CD4:  320 (08/28/08)      Current Cd4:  410           10/28/08 and 08/08/08:  RPR negative  08/28/08:  Hep B & C, GC, Chlamydia           TB skin test ordered but not done.            08/28/08:  pneumovax given  HTN tobacco abuse Marijuana use Previous cocaine, heavy, now clean since about 2000. Previous alcohol, heavy, now clean since about 2005. Left shoulder post-traumatic OA Mild cognitive impairment (NL TSH, B12, folate):  Likely vascular dementia/psa/? HIV component      CT head 02/12/08 for hypertensive crisis and concern for stroke:      IMPRESSION:        1.  Old right internal capsule and thalamic lacunar infarcts.        2.  Mild to moderate chronic small vessel white matter ischemic             changes in both cerebral hemispheres, greater on the right.  Past Surgical History: Last updated: July 17, 2008 Left shoulder arthroscopy w/ extensive debridement (01/2008 by Dr. Mardelle Matte) Right knee patellar reduction Hemorrhoidectomy  Family History: Last updated: 2008/07/17 Mother deceased (38yo):  CVA, HTN, DM Father deceased (50yo):  DM 2 sisters alive:  HTN in both 1  brother alive:  healthy  Social History: Last updated: 10/28/2008 Lives in [place] with male friend Dirk Dress), they have been together x 20 yrs but are no longer sexually active. Not working Current Smoker:  1/2  pack/month x 17 years (more in past) Alcohol use-no (past, clean x 4 years) Drug use - yes, THC  Risk Factors: Smoking Status: current (04/21/2009) Packs/Day: 0.5 (04/21/2009)  Social History: Packs/Day:  0.5  Review of Systems      See HPI  Physical Exam  General:  Very thin 50 middle-aged man in NAD. He appears withdrawn and lets his friend do most of the talking. Head:  atraumatic.   Eyes:  vision grossly intact, pupils equal, pupils round, pupils reactive to light, and no injection. Bilateral pinguecula. Mouth:  Poor dentition with several teeth extracted. OP clear, no thrush. Lungs:  normal respiratory effort, no accessory muscle use. Heart:  normal rate, regular rhythm, and no murmur.   Abdomen:  soft, non-tender, normal bowel sounds, no distention, no masses, no guarding, and no rigidity.   Extremities:  No edema Neurologic:  Pt is awake and alert although he is very withdrawn. cranial nerves II-XII intact and gait normal.   Psych:  Pt doesn't interact much, he keeps his head down and when questioned, he takes a while to answer and uses very few words. Per his partner, he has become this way after his strokes.   Impression & Recommendations:  Problem # 1:  HIV INFECTION (ICD-042) Pt off of meds for last 2 weeks Check CD4 and VL today Continue current regimen May need dose adjustment of truvada if Cr remains up Avoid atripla given poor adherence and likelyhood of resistance, also has truvada in it and Cr may be an issue. Will check HLA [id number] in case abacavir needs to be used in future. Set f/u appointment with Dr. Tomma Lightning  Orders: T-HIV Viral Load [phone]) T-CD4PRO ([place]) ([place]) T- * Misc. Laboratory test [phone])  Problem # 2:  RENAL  INSUFFICIENCY (ICD-588.9) Recheck BMET today See above as may need dose adjustments  Orders: T-Basic Metabolic Panel (99991111)  Problem # 3:  HYPERTENSION (ICD-401.9)  Off meds for last 2 weeks. Previously well controlled. See last office visit, labs and append for details....will hold off on HCTZ for now.  The  following medications were removed from the medication list:    Hydrochlorothiazide 25 Mg Tabs (Hydrochlorothiazide) .Marland Kitchen... Take 1 tablet by mouth once a day His updated medication list for this problem includes:    Amlodipine Besylate 10 Mg Tabs (Amlodipine besylate) .Marland Kitchen... 1 tablet by mouth daily    Lisinopril 40 Mg Tabs (Lisinopril) .Marland Kitchen... Take 1 tablet by mouth once a day  Orders: T-Basic Metabolic Panel (99991111)  BP today: 161/102 Prior BP: 95/70 (11/19/2008)  Labs Reviewed: K+: 4.0 (11/19/2008) Creat: : 1.51 (11/19/2008)   Chol: 176 (09/01/2008)   HDL: 35 (09/01/2008)   LDL: 122 (09/01/2008)   TG: 96 (09/01/2008)  Problem # 4:  TOBACCO ABUSE (ICD-305.1) Continues to smoke tobacco and THC. No interest in stopping at this point.   Complete Medication List: 1)  Amlodipine Besylate 10 Mg Tabs (Amlodipine besylate) .Marland Kitchen.. 1 tablet by mouth daily 2)  Lisinopril 40 Mg Tabs (Lisinopril) .... Take 1 tablet by mouth once a day 3)  Pravachol 40 Mg Tabs (Pravastatin sodium) .... Take 1 tab by mouth at bedtime 4)  Prezista 400 Mg Tabs (Darunavir ethanolate) .... Take 2 tabs by mouth once daily. 5)  Norvir 100 Mg Caps (Ritonavir) .... Take 1 tab by mouth once daily. 6)  Truvada 200-300 Mg Tabs (Emtricitabine-tenofovir) .... Take 1 tab by mouth once daily. 7)  Bayer Aspirin Ec Low Dose 81 Mg Tbec (Aspirin) .... Take 1 tablet by mouth once a day  Patient Instructions: 1)  Please schedule a follow-up appointment in 1 month. 2)  Make sure to take your medications as listed below. 3)  All the medicines have been sent to pharmacy today. 4)  Do not take the  hydrochlorothiazide for blood pressure. 5)  Need to set up appointment with Dr. Tomma Lightning.  Prescriptions: TRUVADA 200-300 MG TABS (EMTRICITABINE-TENOFOVIR) Take 1 tab by mouth once daily.  #30 x 5   Entered and Authorized by:   Alphia Moh MD   Signed by:   Alphia Moh MD on 04/21/2009   Method used:   Electronically to        [facility]. [phone]* (retail)       [address] [place]  [address]       Ph: [phone] or [phone]       Fax: [phone]   RxID:   [id number] NORVIR 100 MG CAPS (RITONAVIR) Take 1 tab by mouth once daily.  #30 x 5   Entered and Authorized by:   Alphia Moh MD   Signed by:   Alphia Moh MD on 04/21/2009   Method used:   Electronically to        [facility]. [phone]* (retail)       [address] [place]  [address]       Ph: [phone] or [phone]       Fax: ID:[phone]   RxID:   [id number] PREZISTA 400 MG TABS (DARUNAVIR ETHANOLATE) Take 2 tabs by mouth once daily.  #60 x 5   Entered and Authorized by:   Alphia Moh MD   Signed by:   Alphia Moh MD on 04/21/2009   Method used:   Electronically to        [facility]. [phone]* (retail)       [address]       [place],   [address]  Ph: [phone] or [phone]       Fax: ID:[phone]   [id number] PRAVACHOL 40 MG TABS (PRAVASTATIN SODIUM) Take 1 tab by mouth at bedtime  #30 x 3   Entered and Authorized by:   Alphia Moh MD   Signed by:   Alphia Moh MD on 04/21/2009   Method used:   Electronically to        [facility]. [phone]* (retail)       [address], [place]  [address]       Ph: [phone] or [phone]       Fax: ID:[phone]   [id number] LISINOPRIL 40 MG TABS (LISINOPRIL) Take 1 tablet by mouth once a day  #30 x 3   Entered and Authorized by:   Alphia Moh MD   Signed by:   Alphia Moh MD on 04/21/2009   Method used:    Electronically to        [facility]. [phone]* (retail)       [address] [place]  [address]       Ph: [phone] or [phone]       Fax: ID:[phone]   [id number] AMLODIPINE BESYLATE 10 MG TABS (AMLODIPINE BESYLATE) 1 tablet by mouth daily  #30 x 3   Entered and Authorized by:   Alphia Moh MD   Signed by:   Alphia Moh MD on 04/21/2009   Method used:   Electronically to        [facility]. [phone]* (retail)       [address], [place]  [address]       Ph: [phone] or [phone]       Fax: ID:[phone]   RxID:   [id number]   Prevention & Chronic Care Immunizations   Influenza vaccine: Historical  (07/22/2008)    Tetanus booster: Not documented    Pneumococcal vaccine: Pneumovax  (08/28/2008)  Colorectal Screening   Hemoccult: Not documented    Colonoscopy: Not documented  Other Screening   PSA: Not documented   Smoking status: current  (04/21/2009)   Smoking cessation counseling: yes  (04/21/2009)  Lipids   Total Cholesterol: 176  (09/01/2008)   LDL: 122  (09/01/2008)   LDL Direct: Not documented   HDL: 35  (09/01/2008)   Triglycerides: 96  (09/01/2008)    SGOT (AST): 16  (11/19/2008)   SGPT (ALT): 19  (11/19/2008)   Alkaline phosphatase: 116  (11/19/2008)   Total bilirubin: 0.3  (11/19/2008)  Hypertension   Last Blood Pressure: 161 / 102  (04/21/2009)   Serum creatinine: 1.51  (11/19/2008)   Serum potassium 4.0  (11/19/2008)   Hypertension comments: Patient states taking HTN meds, friend says he is not consistent [name])  Self-Management Support :    Patient will work on the following items until the next clinic visit to reach self-care goals:     Medications and monitoring: weigh myself weekly  (04/21/2009)     Eating: eat foods that are low in salt, eat baked foods instead of fried foods  (04/21/2009)     Other: Per Ms. [name] does not take meds consistently / has no teeth eats  soft foods. Needs to get appt. with Dr. Carlota Raspberry [facility] re: teeth  (04/21/2009)    Hypertension self-management support:  Not documented    Lipid self-management support: Not documented   Process Orders Check Orders Results:     [facility]: Order checked:     [id number] -- T- * Misc. Laboratory test -- No CPT codes found (CPT: ) Tests Sent for requisitioning (April 21, 2009 4:02 PM):     04/21/2009: [facility] -- T-Basic Metabolic Panel 0000000 (signed)     04/21/2009: [facility] -- T-HIV Viral Load [phone] (signed)     04/21/2009: [facility] -- [place]. Laboratory test [id number] (signed)

## 2010-07-20 NOTE — Assessment & Plan Note (Signed)
Summary: FU VISIT CONCERN ABOUT MEDS/ALVAREZ/DS   Vital Signs:  Patient profile:   50 year old male Height:      67 inches (170.18 cm) Weight:      134.0 pounds (60.91 kg) BMI:     21.06 Temp:     97.3 degrees F (36.28 degrees C) oral Pulse rate:   73 / minute BP sitting:   175 / 92  (left arm) Cuff size:   regular  Vitals Entered By: Mateo Flow Deborra Medina) (April 29, 2010 2:38 PM) CC: routine f/u, med refill,  Is Patient Diabetic? No Pain Assessment Patient in pain? no      Nutritional Status BMI of 19 -24 = normal  Have you ever been in a relationship where you felt threatened, hurt or afraid?Unable to ask   Does patient need assistance? Functional Status Self care Ambulation Normal   Primary Care Provider:  Alphia Moh MD  CC:  routine f/u, med refill, and .  History of Present Illness: Pt is a 50 yo AAM with PMH of HIV, HTN and HLD who came here for regular f/u and meds refill. He has run out of his meds for about 1 week. No HA, CP, cough, SOB or diarrhea. He stopped his HIV meds 3 months ago and his CD4 has dropped from 920 to 330 and VL increased from 382 to 33700. He has no other c/o and has been living with cousin or his girlfriend. He denies depression. Current smoker about 1 PPD and drink occasionally, sometimes use marijuna. He has gained 7 lbs since last visit and feels stronger.   Preventive Screening-Counseling & Management  Alcohol-Tobacco     Alcohol drinks/day: 0     Smoking Status: current     Smoking Cessation Counseling: yes     Packs/Day: 1.0     Year Started: < 7 CIG A DAY  Problems Prior to Update: 1)  Depression, Acute  (ICD-296.23) 2)  Influenza Like Illness  (ICD-487.1) 3)  Anemia  (ICD-285.9) 4)  Renal Insufficiency  (ICD-588.9) 5)  Foot&toe Blister Without Mention of Infection  (ICD-917.2) 6)  Unspecified Disorder Teeth&supporting Structures  (ICD-525.9) 7)  Positive Ppd  (ICD-795.5) 8)  HIV Infection  (ICD-042) 9)   Substance Abuse, Multiple  (ICD-305.90) 10)  Memory Loss  (ICD-780.93) 11)  Dyslipidemia  (ICD-272.4) 12)  Arthritis  (ICD-716.90) 13)  Tobacco Abuse  (ICD-305.1) 14)  Hypertension  (ICD-401.9)  Medications Prior to Update: 1)  Amlodipine Besylate 10 Mg Tabs (Amlodipine Besylate) .Marland Kitchen.. 1 Tablet By Mouth Daily 2)  Lisinopril 40 Mg Tabs (Lisinopril) .... Take 1 Tablet By Mouth Once A Day. Please Hold This Med For Now As Cr Goes Up. 3)  Pravachol 40 Mg Tabs (Pravastatin Sodium) .... Take 1 Tab By Mouth At Bedtime 4)  Bayer Aspirin Ec Low Dose 81 Mg Tbec (Aspirin) .... Take 1 Tablet By Mouth Once A Day 5)  Tylenol Arthritis Pain 650 Mg Cr-Tabs (Acetaminophen) .... Take 1 Tablet By Mouth Three Times A Day As Needed 6)  Citalopram Hydrobromide 10 Mg Tabs (Citalopram Hydrobromide) .... Take 1 Tablet By Mouth Once A Day  Current Medications (verified): 1)  Amlodipine Besylate 10 Mg Tabs (Amlodipine Besylate) .Marland Kitchen.. 1 Tablet By Mouth Daily 2)  Lisinopril 40 Mg Tabs (Lisinopril) .... Take 1 Tablet By Mouth Once A Day. Please Hold This Med For Now As Cr Goes Up. 3)  Pravachol 40 Mg Tabs (Pravastatin Sodium) .... Take 1 Tab By Mouth At Bedtime 4)  Bayer Aspirin Ec Low Dose 81 Mg Tbec (Aspirin) .... Take 1 Tablet By Mouth Once A Day 5)  Tylenol Arthritis Pain 650 Mg Cr-Tabs (Acetaminophen) .... Take 1 Tablet By Mouth Three Times A Day As Needed 6)  Citalopram Hydrobromide 10 Mg Tabs (Citalopram Hydrobromide) .... Take 1 Tablet By Mouth Once A Day  Allergies: 1)  * Tb Skin Test Some Skin Reaction  Past History:  Past Medical History: Last updated: 11/19/2008 HIV dx 08/08/2008 (unknown how he was exposed)      Initial viral load:  43,200 (08/28/08)      Current Viral Load:  576       Initial CD4:  320 (08/28/08)      Current Cd4:  410           10/28/08 and 08/08/08:  RPR negative           08/28/08:  Hep B & C, GC, Chlamydia           TB skin test ordered but not done.            08/28/08:  pneumovax  given  HTN tobacco abuse Marijuana use Previous cocaine, heavy, now clean since about 2000. Previous alcohol, heavy, now clean since about 2005. Left shoulder post-traumatic OA Mild cognitive impairment (NL TSH, B12, folate):  Likely vascular dementia/psa/? HIV component      CT head 02/12/08 for hypertensive crisis and concern for stroke:      IMPRESSION:        1.  Old right internal capsule and thalamic lacunar infarcts.        2.  Mild to moderate chronic small vessel white matter ischemic             changes in both cerebral hemispheres, greater on the right.  Family History: Last updated: 07-13-2008 Mother deceased (40yo):  CVA, HTN, DM Father deceased (10yo):  DM 2 sisters alive:  HTN in both 1 brother alive:  healthy  Social History: Last updated: 10/28/2008 Lives in [place] with male friend Dirk Dress), they have been together x 20 yrs but are no longer sexually active. Not working Current Smoker:  1/2  pack/month x 17 years (more in past) Alcohol use-no (past, clean x 4 years) Drug use - yes, THC  Risk Factors: Smoking Status: current (04/29/2010) Packs/Day: 1.0 (04/29/2010)  Family History: Reviewed history from Jul 13, 2008 and no changes required. Mother deceased (58yo):  CVA, HTN, DM Father deceased (33yo):  DM 2 sisters alive:  HTN in both 1 brother alive:  healthy  Social History: Reviewed history from 10/28/2008 and no changes required. Lives in [place] with male friend Dirk Dress), they have been together x 20 yrs but are no longer sexually active. Not working Current Smoker:  1/2  pack/month x 17 years (more in past) Alcohol use-no (past, clean x 4 years) Drug use - yes, THC Packs/Day:  1.0  Review of Systems       The patient complains of weight gain.  The patient denies fever, chest pain, syncope, dyspnea on exertion, peripheral edema, headaches, abdominal pain, melena, and hematochezia.    Physical Exam  General:  alert, well-developed,  well-nourished, and well-hydrated.   Nose:  no nasal discharge.   Mouth:  pharynx pink and moist.   Lungs:  normal respiratory effort, normal breath sounds, no crackles, and no wheezes.   Heart:  normal rate, regular rhythm, no murmur, and no JVD.   Abdomen:  soft, non-tender, normal bowel sounds,  no distention, and no masses.   Msk:  normal ROM, no joint tenderness, no joint swelling, and no joint warmth.   Pulses:  2+ Extremities:  No edema.  Neurologic:  alert & oriented X3, cranial nerves II-XII intact, strength normal in all extremities, sensation intact to light touch, and gait normal.     Impression & Recommendations:  Problem # 1:  HYPERTENSION (ICD-401.9) Assessment Improved His BP still very high due to not taking his meds for about 1 week. He has enough refills and asked the patient and his friend to go to his pharmacy to get them and continue these meds. Will recheck BP at next  visit in a month. His updated medication list for this problem includes:    Amlodipine Besylate 10 Mg Tabs (Amlodipine besylate) .Marland Kitchen... 1 tablet by mouth daily    Lisinopril 40 Mg Tabs (Lisinopril) .Marland Kitchen... Take 1 tablet by mouth once a day. please hold this med for now as cr goes up.  BP today: 175/92 Prior BP: 215/108 (02/04/2010)  Prior 10 Yr Risk Heart Disease: 27 % (02/04/2010)  Labs Reviewed: K+: 3.9 (02/04/2010) Creat: : 1.25 (02/04/2010)   Chol: 183 (02/04/2010)   HDL: 65 (02/04/2010)   LDL: 100 (02/04/2010)   TG: 89 (02/04/2010)  Problem # 2:  SUBSTANCE ABUSE, MULTIPLE (ICD-305.90) Assessment: Comment Only Provided conseling for quit smoking and drug. He would like to cut first.   Problem # 3:  DYSLIPIDEMIA (ICD-272.4) Assessment: Improved At goal and will continue to take statin. No side effects of muscle pain. His updated medication list for this problem includes:    Pravachol 40 Mg Tabs (Pravastatin sodium) .Marland Kitchen... Take 1 tab by mouth at bedtime  Labs Reviewed: SGOT: 23 (02/04/2010)    SGPT: 17 (02/04/2010)  Prior 10 Yr Risk Heart Disease: 27 % (02/04/2010)   HDL:65 (02/04/2010), 35 (09/01/2008)  LDL:100 (02/04/2010), 122 (09/01/2008)  Chol:183 (02/04/2010), 176 (09/01/2008)  Trig:89 (02/04/2010), 96 (09/01/2008)  Problem # 4:  DEPRESSION, ACUTE (ICD-296.23) Assessment: Unchanged Stable. Continue home meds.   Problem # 5:  HIV INFECTION (ICD-042) Assessment: Deteriorated He has stopped his HIV since 8/201 and CD4 dropped to 330 from 920 and VL increased to 33700 from 382. Asked him to call his ID physician office to make appointment with Dr. Tommy Medal for management of his HIV. He said he will call him soon.  Complete Medication List: 1)  Amlodipine Besylate 10 Mg Tabs (Amlodipine besylate) .Marland Kitchen.. 1 tablet by mouth daily 2)  Lisinopril 40 Mg Tabs (Lisinopril) .... Take 1 tablet by mouth once a day. please hold this med for now as cr goes up. 3)  Pravachol 40 Mg Tabs (Pravastatin sodium) .... Take 1 tab by mouth at bedtime 4)  Bayer Aspirin Ec Low Dose 81 Mg Tbec (Aspirin) .... Take 1 tablet by mouth once a day 5)  Tylenol Arthritis Pain 650 Mg Cr-tabs (Acetaminophen) .... Take 1 tablet by mouth three times a day as needed 6)  Citalopram Hydrobromide 10 Mg Tabs (Citalopram hydrobromide) .... Take 1 tablet by mouth once a day  Other Orders: T-Hemoccult Card-Multiple (take home) [id number])  Patient Instructions: 1)  Please schedule a follow-up appointment in 1 month. 2)  Please call ID office to make appointment with Dr. Tommy Medal ASAP. 3)  Tobacco is very bad for your health and your loved ones! You Should stop smoking!. 4)  Stop Smoking Tips: Choose a Quit date. Cut down before the Quit date. decide what you will do as  a substitute when you feel the urge to smoke(gum,toothpick,exercise). 5)  It is important that you exercise regularly at least 20 minutes 5 times a week. If you develop chest pain, have severe difficulty breathing, or feel very tired , stop exercising immediately  and seek medical attention.   Orders Added: 1)  T-Hemoccult Card-Multiple (take home) [[id number]] 2)  Est. Patient Level IV [id number]     Prevention & Chronic Care Immunizations   Influenza vaccine: Historical  (07/22/2008)    Tetanus booster: Not documented    Pneumococcal vaccine: Pneumovax  (08/28/2008)  Colorectal Screening   Hemoccult: Not documented   Hemoccult action/deferral: Ordered  (04/29/2010)    Colonoscopy: Not documented  Other Screening   PSA: Not documented   Smoking status: current  (04/29/2010)   Smoking cessation counseling: yes  (04/29/2010)  Lipids   Total Cholesterol: 183  (02/04/2010)   LDL: 100  (02/04/2010)   LDL Direct: Not documented   HDL: 65  (02/04/2010)   Triglycerides: 89  (02/04/2010)    SGOT (AST): 23  (02/04/2010)   SGPT (ALT): 17  (02/04/2010)   Alkaline phosphatase: 117  (02/04/2010)   Total bilirubin: 0.5  (02/04/2010)    Lipid flowsheet reviewed?: Yes   Progress toward LDL goal: Improved  Hypertension   Last Blood Pressure: 175 / 92  (04/29/2010)   Serum creatinine: 1.25  (02/04/2010)   Serum potassium 3.9  (02/04/2010)    Hypertension flowsheet reviewed?: Yes   Progress toward BP goal: Improved  Self-Management Support :   Personal Goals (by the next clinic visit) :      Personal blood pressure goal: 140/90  (04/29/2010)     Personal LDL goal: 130  (04/29/2010)    Patient will work on the following items until the next clinic visit to reach self-care goals:     Medications and monitoring: take my medicines every day  (04/29/2010)     Eating: eat more vegetables  (04/29/2010)     Activity: take a 30 minute walk every day  (04/29/2010)     Other: Per Ms. [name] does not take meds consistently / has no teeth eats soft foods. Needs to get appt. with Dr. Carlota Raspberry [facility] re: teeth  (04/21/2009)    Hypertension self-management support: Written self-care plan, Education handout  (04/29/2010)   Hypertension self-care plan  printed.   Hypertension education handout printed    Lipid self-management support: Education handout, Resources for patients handout  (02/04/2010)    Nursing Instructions: Give Flu vaccine today Provide Hemoccult cards with instructions (see order)

## 2010-07-20 NOTE — Progress Notes (Signed)
Summary: Refill/gh  Phone Note Refill Request Message from:  Fax from Pharmacy on December 17, 2008 11:44 AM  Triamcinolone 0.1% Ointment apply to rash 3 times a day. Terbinafine HCL 250 tablets 1 tablet by mouth every day   Method Requested: Electronic Initial call taken by: Sander Nephew RN,  December 17, 2008 11:45 AM  Follow-up for Phone Call        Pt is not supposed to be taking Lamisil (terbinafine tablets) since this was stopped because he did not have onychomycosis and possible hepatotoxicity.   He has now completed approximately 6 weeks worth of triamcinolone cream.  If he is still having problems and needing more cream, he should be seen.    Furthermore, he has a bunch of labs that I ordered after last visit that need to be done on his next visit.   No refills for above reasons.  Follow-up by: Alphia Moh MD,  December 17, 2008 7:26 PM  Additional Follow-up for Phone Call Additional follow up Details #1::        Rx denial called/faxed to pharmacy Additional Follow-up by: Sander Nephew RN,  December 18, 2008 3:19 PM

## 2010-07-20 NOTE — Assessment & Plan Note (Signed)
Summary: CHECKUP/SB.   Vital Signs:  Patient profile:   50 year old male Height:      67.3 inches (170.94 cm) Weight:      124.5 pounds (56.59 kg) BMI:     19.40 Temp:     97.7 degrees F (36.50 degrees C) oral Pulse rate:   77 / minute BP sitting:   159 / 101  (right arm)  Vitals Entered By: Silverio Decamp NT II (September 15, 2008 2:36 PM) Is Patient Diabetic? No Pain Assessment Patient in pain? no       Have you ever been in a relationship where you felt threatened, hurt or afraid?No   Does patient need assistance? Functional Status Self care Ambulation Normal   History of Present Illness: [name] is a 50 yo man with PMH as outlined in chart.  He is here for follow up of his blood pressure and newly diagnosed HIV.  Here today with his girlfriend of 3 years, Peter Cox.  She is very supportive of him.  He is currently staying with her.  She has been checked and awaiting results.  She had previously made plans to move back to [place], and if so, he should be moving in with his daughter.  He is obviously ok with discussing all issues with her as well.  Pt states he is taking all his medications the way he is supposed to, however, [name] disagrees stating he does take them but very inconsistently.   Preventive Screening-Counseling & Management     Smoking Status: current     Smoking Cessation Counseling: yes     Packs/Day: 1 pk per week     Year Started: 7 CIGARETTES PER DAY     Does Patient Exercise: yes     Type of exercise: WALKING     Times/week:  2-3  Current Medications (verified): 1)  Hydrochlorothiazide 25 Mg Tabs (Hydrochlorothiazide) .... Take 1 Tablet By Mouth Once A Day 2)  Amlodipine Besylate 10 Mg Tabs (Amlodipine Besylate) .Marland Kitchen.. 1 Tablet By Mouth Daily 3)  Lisinopril 40 Mg Tabs (Lisinopril) .... Take 1 Tablet By Mouth Once A Day 4)  Pravachol 40 Mg Tabs (Pravastatin Sodium) .... Take 1 Tab By Mouth At Bedtime  Allergies: No Known Drug Allergies  Past  History:  Past Medical History:    HIV dx 08/08/2008         08/08/08:  RPR negative         08/28/08:  Hep B & C, GC, Chlamydia         TB skin test ordered but not done.          08/28/08:  pneumovax given     HTN    tobacco abuse    Marijuana use    Previous cocaine, heavy, now clean since about 2000.    Previous alcohol, heavy, now clean since about 2005.    Left shoulder post-traumatic OA    Mild cognitive impairment (NL TSH, B12, folate):  Likely vascular dementia/psa/? HIV component         CT head 02/12/08 for hypertensive crisis and concern for stroke:         IMPRESSION:           1.  Old right internal capsule and thalamic lacunar infarcts.           2.  Mild to moderate chronic small vessel white matter ischemic  changes in both cerebral hemispheres, greater on the right.  Family History:    Reviewed history from 06/24/2008 and no changes required:       Mother deceased (27yo):  CVA, HTN, DM       Father deceased (55yo):  DM       2 sisters alive:  HTN in both       1 brother alive:  healthy  Social History:    Reviewed history from 08/08/2008 and no changes required:       Lives in [place] with male friend       Not working       Current Smoker:  1/2  pack/month x 17 years (more in past)       Alcohol use-no (past, clean x 4 years)       Drug use - yes, THC  Review of Systems      See HPI  Physical Exam  General:  alert and cooperative to examination.   Eyes:  anicteric, no injection Lungs:  normal respiratory effort and no accessory muscle use.   Neurologic:  alert & oriented X3, cranial nerves II-XII intact, strength normal in all extremities, and gait normal.   Psych:  Oriented X3 and normally interactive.     Impression & Recommendations:  Problem # 1:  HIV INFECTION (ICD-042) -Pt has been set up with ID clinic. -He is to see Tammy followed by Dr. Tomma Lightning (10/07/08). -CD4 count 320 -VL 43200 -No need to start prophylactic Abx -Would like  to start antiretrovirals, such as atripla for ease of administration, but will defer to ID given pt is not very adherent to medications.  He is currently staying with girlfriend who may be key for pt to adhere to regimen.  If she moves back to [place], daughter may provide assistance needed for adherence. -TB skin test ordered on append from HIV positive test, but not done.  Will need to be done on follow up visit.  Had very long discussion regarding his diagnosis, need for adherence and follow up, and prognosis.  Girlfriend recently tested and awaiting results.   Problem # 2:  HYPERTENSION (ICD-401.9) -BP continues significantly elevated. -However, as per prior visits, pt is not taking medications reliably. -Again, had lengthy discussion regarding need for adherence to regimen to further titrate appropriately and prevent further CV events.  -Assume now that pt has girlfriend and/or daughter involved, this issue will improve. -Will not make any adjustments to regimen since pt is not fully adherent to current list below. -If not at goal once fully adherent, would consider adding coreg since it is $4 and relatively easy dosing.  However, would warn pt about need to abstain from cocaine (negative since 2000) although it does have alpha blockade as well.   spent approximately 30 minutes with pt and girlfriend.   His updated medication list for this problem includes:    Hydrochlorothiazide 25 Mg Tabs (Hydrochlorothiazide) .Marland Kitchen... Take 1 tablet by mouth once a day    Amlodipine Besylate 10 Mg Tabs (Amlodipine besylate) .Marland Kitchen... 1 tablet by mouth daily    Lisinopril 40 Mg Tabs (Lisinopril) .Marland Kitchen... Take 1 tablet by mouth once a day  BP today: 159/101 Prior BP: 165/95 (08/28/2008)  Labs Reviewed: K+: 3.9 (08/28/2008) Creat: : 1.25 (08/28/2008)   Chol: 176 (09/01/2008)   HDL: 35 (09/01/2008)   LDL: 122 (09/01/2008)   TG: 96 (09/01/2008)  Problem # 3:  DYSLIPIDEMIA (ICD-272.4) -Pt not taking statin yet  from information  provided by girlfriend. -Have discussed this with her and all prescriptions re-printed for them to pick up and eliminate confusion. -Will recheck lipids and LFTs in about 3 months.   His updated medication list for this problem includes:    Pravachol 40 Mg Tabs (Pravastatin sodium) .Marland Kitchen... Take 1 tab by mouth at bedtime  Labs Reviewed: SGOT: 13 (08/28/2008)   SGPT: 12 (08/28/2008)   HDL:35 (09/01/2008), 34 (06/24/2008)  LDL:122 (09/01/2008), 150 (06/24/2008)  Chol:176 (09/01/2008), 198 (06/24/2008)  Trig:96 (09/01/2008), 72 (06/24/2008)  Problem # 4:  TOBACCO ABUSE (ICD-305.1)  Encouraged smoking cessation and discussed different methods for smoking cessation.   Complete Medication List: 1)  Hydrochlorothiazide 25 Mg Tabs (Hydrochlorothiazide) .... Take 1 tablet by mouth once a day 2)  Amlodipine Besylate 10 Mg Tabs (Amlodipine besylate) .Marland Kitchen.. 1 tablet by mouth daily 3)  Lisinopril 40 Mg Tabs (Lisinopril) .... Take 1 tablet by mouth once a day 4)  Pravachol 40 Mg Tabs (Pravastatin sodium) .... Take 1 tab by mouth at bedtime  Patient Instructions: 1)  Please schedule a follow-up appointment in 1 month, please schedule with Dr. Philbert Riser if possible. 2)  Continue all your medicines listed below. 3)  Need to stop smoking, all included. 4)  Make sure to keep appointment with Tammy and Dr. Casilda Carls. 5)  Make sure he is taking pravachol, cholesterol medicine, that was called in to pharmacy. 6)  If you have any problems before your next visit, call clinic.  Prescriptions: PRAVACHOL 40 MG TABS (PRAVASTATIN SODIUM) Take 1 tab by mouth at bedtime  #30 x 3   Entered and Authorized by:   Alphia Moh MD   Signed by:   Alphia Moh MD on 09/15/2008   Method used:   Print then Give to Patient   RxID:   [id number] LISINOPRIL 40 MG TABS (LISINOPRIL) Take 1 tablet by mouth once a day  #30 x 3   Entered and Authorized by:   Alphia Moh MD   Signed by:   Alphia Moh  MD on 09/15/2008   Method used:   Print then Give to Patient   RxID:   [id number] AMLODIPINE BESYLATE 10 MG TABS (AMLODIPINE BESYLATE) 1 tablet by mouth daily  #30 x 3   Entered and Authorized by:   Alphia Moh MD   Signed by:   Alphia Moh MD on 09/15/2008   Method used:   Print then Give to Patient   RxID:   [id number] HYDROCHLOROTHIAZIDE 25 MG TABS (HYDROCHLOROTHIAZIDE) Take 1 tablet by mouth once a day  #30 x 3   Entered and Authorized by:   Alphia Moh MD   Signed by:   Alphia Moh MD on 09/15/2008   Method used:   Print then Give to Patient   RxID:   [id number]

## 2010-07-20 NOTE — Progress Notes (Signed)
Summary: refill/gg  Phone Note Refill Request  on February 24, 2009 12:26 PM  Refills Requested: Medication #1:  LISINOPRIL 40 MG TABS Take 1 tablet by mouth once a day   Last Refilled: 01/17/2009  Method Requested: Electronic Initial call taken by: Gevena Cotton RN,  February 24, 2009 12:26 PM  Follow-up for Phone Call        Rx denied because  Pt needs to be seen for repeat labs and assessment of his blood pressure.  Please refer to append done 11/19/08 on labs. Follow-up by: Alphia Moh MD,  February 25, 2009 7:23 AM  Additional Follow-up for Phone Call Additional follow up Details #1::        pharmacy notified flag sent to [facility] to schedule appointment Additional Follow-up by: Gevena Cotton RN,  February 25, 2009 12:09 PM

## 2010-07-20 NOTE — Miscellaneous (Signed)
Summary: HIV Test  HIV Test   Imported By: Bonner Puna 08/11/2008 13:48:06  _____________________________________________________________________  External Attachment:    Type:   Image     Comment:   External Document

## 2010-07-20 NOTE — Assessment & Plan Note (Signed)
Summary: cold/fever/gg   Vital Signs:  Patient profile:   50 year old male Height:      67 inches Weight:      126.3 pounds BMI:     19.85 Temp:     101.1 degrees F Pulse rate:   88 / minute BP sitting:   153 / 98  (right arm)  Vitals Entered By: Silverio Decamp NT II (July 29, 2009 3:25 PM) CC: ,NEED REFILLS, COUGH CHILLS, X 2 DAYS Nutritional Status BMI of 19 -24 = normal  Have you ever been in a relationship where you felt threatened, hurt or afraid?No   Does patient need assistance? Functional Status Self care Ambulation Normal   Primary Care Provider:  Alphia Moh MD  CC:  , NEED REFILLS, COUGH CHILLS, and X 2 DAYS.  History of Present Illness: Pt is a 50 yo AAF with PMH of HIV, HTN and HLD who came here for nonproductive cough, fever, chill with running nose. He has these symptoms 2 days ago, has sick contacts, and has taken ASA, now his symptoms is better. No diarrhea, sore throat or dysuria. Current smoker 1/2 PPD, denies ETOH or drugs. He has run out of his meds for 2 days and will go to pharmacy to refill them.  Preventive Screening-Counseling & Management  Alcohol-Tobacco     Alcohol drinks/day: 0     Smoking Status: current     Smoking Cessation Counseling: yes     Packs/Day: 0.5     Year Started: < 7 CIG A DAY  Caffeine-Diet-Exercise     Does Patient Exercise: yes     Type of exercise: WALKING     Times/week:  2-3  Problems Prior to Update: 1)  Anemia  (ICD-285.9) 2)  Renal Insufficiency  (ICD-588.9) 3)  Foot&toe Blister Without Mention of Infection  (ICD-917.2) 4)  Unspecified Disorder Teeth&supporting Structures  (ICD-525.9) 5)  Positive Ppd  (ICD-795.5) 6)  HIV Infection  (ICD-042) 7)  Substance Abuse, Multiple  (ICD-305.90) 8)  Memory Loss  (ICD-780.93) 9)  Dyslipidemia  (ICD-272.4) 10)  Arthritis  (ICD-716.90) 11)  Tobacco Abuse  (ICD-305.1) 12)  Hypertension  (ICD-401.9)  Medications Prior to Update: 1)  Amlodipine Besylate 10 Mg Tabs  (Amlodipine Besylate) .Marland Kitchen.. 1 Tablet By Mouth Daily 2)  Lisinopril 40 Mg Tabs (Lisinopril) .... Take 1 Tablet By Mouth Once A Day 3)  Pravachol 40 Mg Tabs (Pravastatin Sodium) .... Take 1 Tab By Mouth At Bedtime 4)  Prezista 400 Mg Tabs (Darunavir Ethanolate) .... Take 2 Tabs By Mouth Once Daily. 5)  Norvir 100 Mg Caps (Ritonavir) .... Take 1 Tab By Mouth Once Daily. 6)  Truvada 200-300 Mg Tabs (Emtricitabine-Tenofovir) .... Take 1 Tab By Mouth Once Daily. 7)  Bayer Aspirin Ec Low Dose 81 Mg Tbec (Aspirin) .... Take 1 Tablet By Mouth Once A Day  Current Medications (verified): 1)  Amlodipine Besylate 10 Mg Tabs (Amlodipine Besylate) .Marland Kitchen.. 1 Tablet By Mouth Daily 2)  Lisinopril 40 Mg Tabs (Lisinopril) .... Take 1 Tablet By Mouth Once A Day 3)  Pravachol 40 Mg Tabs (Pravastatin Sodium) .... Take 1 Tab By Mouth At Bedtime 4)  Prezista 400 Mg Tabs (Darunavir Ethanolate) .... Take 2 Tabs By Mouth Once Daily. 5)  Norvir 100 Mg Caps (Ritonavir) .... Take 1 Tab By Mouth Once Daily. 6)  Truvada 200-300 Mg Tabs (Emtricitabine-Tenofovir) .... Take 1 Tab By Mouth Once Daily. 7)  Bayer Aspirin Ec Low Dose 81 Mg Tbec (Aspirin) .... Take  1 Tablet By Mouth Once A Day  Allergies (verified): 1)  * Tb Skin Test Some Skin Reaction  Past History:  Past Medical History: Last updated: 11/19/2008 HIV dx 08/08/2008 (unknown how he was exposed)      Initial viral load:  43,200 (08/28/08)      Current Viral Load:  576       Initial CD4:  320 (08/28/08)      Current Cd4:  410           10/28/08 and 08/08/08:  RPR negative           08/28/08:  Hep B & C, GC, Chlamydia           TB skin test ordered but not done.            08/28/08:  pneumovax given  HTN tobacco abuse Marijuana use Previous cocaine, heavy, now clean since about 2000. Previous alcohol, heavy, now clean since about 2005. Left shoulder post-traumatic OA Mild cognitive impairment (NL TSH, B12, folate):  Likely vascular dementia/psa/? HIV component       CT head 02/12/08 for hypertensive crisis and concern for stroke:      IMPRESSION:        1.  Old right internal capsule and thalamic lacunar infarcts.        2.  Mild to moderate chronic small vessel white matter ischemic             changes in both cerebral hemispheres, greater on the right.  Past Surgical History: Last updated: Jun 26, 2008 Left shoulder arthroscopy w/ extensive debridement (01/2008 by Dr. Mardelle Matte) Right knee patellar reduction Hemorrhoidectomy  Family History: Last updated: 06-26-2008 Mother deceased (20yo):  CVA, HTN, DM Father deceased (53yo):  DM 2 sisters alive:  HTN in both 1 brother alive:  healthy  Social History: Last updated: 10/28/2008 Lives in [place] with male friend Dirk Dress), they have been together x 20 yrs but are no longer sexually active. Not working Current Smoker:  1/2  pack/month x 17 years (more in past) Alcohol use-no (past, clean x 4 years) Drug use - yes, THC  Risk Factors: Alcohol Use: 0 (07/29/2009) Exercise: yes (07/29/2009)  Risk Factors: Smoking Status: current (07/29/2009) Packs/Day: 0.5 (07/29/2009)  Family History: Reviewed history from 06/26/08 and no changes required. Mother deceased (73yo):  CVA, HTN, DM Father deceased (40yo):  DM 2 sisters alive:  HTN in both 1 brother alive:  healthy  Social History: Reviewed history from 10/28/2008 and no changes required. Lives in [place] with male friend Dirk Dress), they have been together x 20 yrs but are no longer sexually active. Not working Current Smoker:  1/2  pack/month x 17 years (more in past) Alcohol use-no (past, clean x 4 years) Drug use - yes, THC  Review of Systems       The patient complains of fever and prolonged cough.  The patient denies dyspnea on exertion, headaches, hemoptysis, abdominal pain, melena, and hematochezia.    Physical Exam  General:  alert, well-developed, well-nourished, and well-hydrated.   Head:  normocephalic.   Eyes:   pupils reactive to light.   Ears:  no external deformities.   Nose:  no external erythema and no nasal discharge.   Mouth:  pharynx pink and moist and pharyngeal erythema.   Neck:  supple.   Lungs:  normal respiratory effort, normal breath sounds, no crackles, and no wheezes.   Heart:  normal rate, regular rhythm, no murmur, and no JVD.  Abdomen:  soft, non-tender, normal bowel sounds, and no distention.   Msk:  normal ROM, no joint tenderness, no joint swelling, and no joint warmth.   Pulses:  2+ Extremities:  No edema. Neurologic:  alert & oriented X3, cranial nerves II-XII intact, strength normal in all extremities, sensation intact to light touch, and gait normal.     Impression & Recommendations:  Problem # 1:  INFLUENZA LIKE ILLNESS (ICD-487.1) Assessment Improved He has sick contacts and his symptoms are likely due to flu. Since the symptoms has been better than before, will not give antibiotics or check CXR. Advised him to Rest, increase fluids, use Tylenol [phone] mg every 4-6 hours for fever, and avoid contact with others. call office if no improvement in 5-7 days or if symptoms worsen. Will check CBC. Also advised to stop smoking, which may help his flu recovery.  Problem # 2:  HIV INFECTION (ICD-042) Assessment: Unchanged Will continue current ART treatment.  Orders: T-CBC w/Diff [id number])  Problem # 3:  HYPERTENSION (ICD-401.9) Assessment: Unchanged His BP fairly controlled and has run out meds for several days, so will refill his HTN meds, recheck at next visit. Check BMET.  His updated medication list for this problem includes:    Amlodipine Besylate 10 Mg Tabs (Amlodipine besylate) .Marland Kitchen... 1 tablet by mouth daily    Lisinopril 40 Mg Tabs (Lisinopril) .Marland Kitchen... Take 1 tablet by mouth once a day. please hold this med for now as cr goes up.  Orders: T-Basic Metabolic Panel ([id number])  BP today: 153/98 Prior BP: 161/102 (04/21/2009)  Labs Reviewed: K+: 3.9  (04/21/2009) Creat: : 1.21 (04/21/2009)   Chol: 176 (09/01/2008)   HDL: 35 (09/01/2008)   LDL: 122 (09/01/2008)   TG: 96 (09/01/2008)  Problem # 4:  RENAL INSUFFICIENCY (ICD-588.9) Assessment: Deteriorated His Cr has increased to 1.9 from 1.2. unclear the cause, may be due toHIV nephropathy, nephrotoxicity of ACEIs, dehydration or poorly controlled BP. so for now will hold his lisinopril, will call him to come back in 2 weeks to recheck BMET. Orders: T-Basic Metabolic Panel ([id number] Orders: T-Basic Metabolic Panel ([id number]) ... 08/10/2009  Problem # 5:  TOBACCO ABUSE (ICD-305.1) Assessment: Unchanged Encouraged smoking cessation and discussed different methods for smoking cessation.   Complete Medication List: 1)  Amlodipine Besylate 10 Mg Tabs (Amlodipine besylate) .Marland Kitchen.. 1 tablet by mouth daily 2)  Lisinopril 40 Mg Tabs (Lisinopril) .... Take 1 tablet by mouth once a day. please hold this med for now as cr goes up. 3)  Pravachol 40 Mg Tabs (Pravastatin sodium) .... Take 1 tab by mouth at bedtime 4)  Prezista 400 Mg Tabs (Darunavir ethanolate) .... Take 2 tabs by mouth once daily. 5)  Norvir 100 Mg Caps (Ritonavir) .... Take 1 tab by mouth once daily. 6)  Truvada 200-300 Mg Tabs (Emtricitabine-tenofovir) .... Take 1 tab by mouth once daily. 7)  Bayer Aspirin Ec Low Dose 81 Mg Tbec (Aspirin) .... Take 1 tablet by mouth once a day 8)  Tylenol Arthritis Pain 650 Mg Cr-tabs (Acetaminophen) .... Take 1 tablet by mouth three times a day as needed  Patient Instructions: 1)  Please schedule a follow-up appointment in 3 months. 2)  Will call you if abnormal labs.  3)  If your symptoms become worse, please call Clinic for appointment if your symptoms become worse in 3-5 days.  Prescriptions: BAYER ASPIRIN EC LOW DOSE 81 MG TBEC (ASPIRIN) Take 1 tablet by mouth once a day  #31 x 2  Entered and Authorized by:   Geanie Kenning MD   Signed by:   Geanie Kenning MD on 07/29/2009    Method used:   Electronically to        [facility]. [phone]* (retail)       [address], [place]  [address]       Ph: [phone] or [phone]       Fax: ID:[phone]   RxID:   [id number] TRUVADA 200-300 MG TABS (EMTRICITABINE-TENOFOVIR) Take 1 tab by mouth once daily.  #30 x 5   Entered and Authorized by:   Geanie Kenning MD   Signed by:   Geanie Kenning MD on 07/29/2009   Method used:   Electronically to        [facility]. [phone]* (retail)       [address], [place]  [address]       Ph: [phone] or [phone]       Fax: ID:[phone]   RxID:   [id number] NORVIR 100 MG CAPS (RITONAVIR) Take 1 tab by mouth once daily.  #30 x 5   Entered and Authorized by:   Geanie Kenning MD   Signed by:   Geanie Kenning MD on 07/29/2009   Method used:   Electronically to        [facility]. [phone]* (retail)       [address] [place]  [address]       Ph: [phone] or [phone]       Fax: ID:[phone]   RxID:   [id number] PREZISTA 400 MG TABS (DARUNAVIR ETHANOLATE) Take 2 tabs by mouth once daily.  #60 x 5   Entered and Authorized by:   Geanie Kenning MD   Signed by:   Geanie Kenning MD on 07/29/2009   Method used:   Electronically to        [facility]. [phone]* (retail)       [address] [place]  [address]       Ph: [phone] or [phone]       Fax: ID:[phone]   [id number] PRAVACHOL 40 MG TABS (PRAVASTATIN SODIUM) Take 1 tab by mouth at bedtime  #30 x 3   Entered and Authorized by:   Geanie Kenning MD   Signed by:   Geanie Kenning MD on 07/29/2009   Method used:   Electronically to        [facility]. [phone]* (retail)       [address] [place]  [address]       Ph: [phone] or [phone]       Fax: ID:[phone]   [id number] LISINOPRIL 40 MG TABS (LISINOPRIL) Take 1 tablet by mouth once a day  #30 x 3   Entered and Authorized  by:   Geanie Kenning MD   Signed by:   Geanie Kenning MD on 07/29/2009   Method used:   Electronically to        [facility]. [phone]* (retail)       [address]       [place],   [address]       Ph: [phone] or [phone]  Fax: ID:[phone]   [id number] AMLODIPINE BESYLATE 10 MG TABS (AMLODIPINE BESYLATE) 1 tablet by mouth daily  #30 x 3   Entered and Authorized by:   Geanie Kenning MD   Signed by:   Geanie Kenning MD on 07/29/2009   Method used:   Electronically to        [facility]. [phone]* (retail)       [address], [place]  [address]       Ph: [phone] or [phone]       Fax: ID:[phone]   [id number] TYLENOL ARTHRITIS PAIN 650 MG CR-TABS (ACETAMINOPHEN) Take 1 tablet by mouth three times a day as needed  #30 x 1   Entered and Authorized by:   Geanie Kenning MD   Signed by:   Geanie Kenning MD on 07/29/2009   Method used:   Electronically to        [facility]. [phone]* (retail)       [address], [place]  [address]       Ph: [phone] or [phone]       Fax: [phone]   RxID:   [id number]   Prevention & Chronic Care Immunizations   Influenza vaccine: Historical  (07/22/2008)    Tetanus booster: Not documented    Pneumococcal vaccine: Pneumovax  (08/28/2008)  Colorectal Screening   Hemoccult: Not documented    Colonoscopy: Not documented  Other Screening   PSA: Not documented   Smoking status: current  (07/29/2009)   Smoking cessation counseling: yes  (07/29/2009)  Lipids   Total Cholesterol: 176  (09/01/2008)   LDL: 122  (09/01/2008)   LDL Direct: Not documented   HDL: 35  (09/01/2008)   Triglycerides: 96  (09/01/2008)    SGOT (AST): 16  (11/19/2008)   SGPT (ALT): 19  (11/19/2008)   Alkaline phosphatase: 116  (11/19/2008)   Total bilirubin: 0.3  (11/19/2008)    Lipid flowsheet reviewed?: Yes   Progress toward LDL goal: At goal  Hypertension    Last Blood Pressure: 153 / 98  (07/29/2009)   Serum creatinine: 1.21  (04/21/2009)   Serum potassium 3.9  (04/21/2009)    Hypertension flowsheet reviewed?: Yes   Progress toward BP goal: Unchanged  Self-Management Support :    Patient will work on the following items until the next clinic visit to reach self-care goals:     Medications and monitoring: check my blood pressure  (07/29/2009)     Eating: drink diet soda or water instead of juice or soda, eat more vegetables, use fresh or frozen vegetables, eat foods that are low in salt, eat baked foods instead of fried foods  (07/29/2009)     Other: Per Ms. [name] does not take meds consistently / has no teeth eats soft foods. Needs to get appt. with Dr. Carlota Raspberry on [address] re: teeth  (04/21/2009)    Hypertension self-management support: Written self-care plan  (07/29/2009)   Hypertension self-care plan printed.    Lipid self-management support: Written self-care plan  (07/29/2009)   Lipid self-care plan printed.   Process Orders Check Orders Results:     [facility]: Check successful Tests Sent for requisitioning (August 01, 2009 10:01 PM):     07/29/2009: [facility] -- T-Basic Metabolic Panel 0000000 (signed)     07/29/2009: [facility] -- [facility] w/Diff [id number] (signed)  08/10/2009: [facility] -- T-Basic Metabolic Panel 0000000 (signed)

## 2010-07-20 NOTE — Assessment & Plan Note (Signed)
Summary: f/u 1week per Dr. Philbert Riser {mkj].   Vital Signs:  Patient profile:   50 year old male Height:      67.3 inches (170.94 cm) Weight:      126.2 pounds (57.36 kg) BMI:     19.66 Temp:     97.0 degrees F (36.11 degrees C) oral Pulse rate:   85 / minute BP sitting:   165 / 95  (right arm)  Vitals Entered By: Hilda Blades Ditzler RN (August 28, 2008 8:48 AM) Is Patient Diabetic? No Pain Assessment Patient in pain? no      Nutritional Status BMI of < 19 = underweight Nutritional Status Detail appetite good  Have you ever been in a relationship where you felt threatened, hurt or afraid?denies   Does patient need assistance? Functional Status Self care Ambulation Normal Comments Reason for visit - FU - doing well.   History of Present Illness: Peter Cox is a 50 yo man with PMH as outlined in chart.  Pt last saw Dr. Philbert Riser about three weeks ago in clinic for increased confusion and forgetfulness. At that time, pt was accompanied by his daugter. During the w/u for the pt's presentation, the patient was found to be HIV positive.   The pt presents to the clinic today by himself. He had been told yesterday apparantly by a health care worker (unsure of name or position) that he has HIV. Pt today has no complaints and does not have any questions about his HIV. We discussed the importance of close medical follow-up, medication compliance and safe sex practices.   Preventive Screening-Counseling & Management     Smoking Status: current     Packs/Day: 1 pk per week  Allergies: No Known Drug Allergies  Physical Exam  General:  alert.  NAD Lungs:  normal respiratory effort, no crackles, and no wheezes.   Heart:  normal rate, regular rhythm, no murmur, no gallop, and no rub.   Abdomen:  Soft, NT Neurologic:  alert & oriented. Somewhat poor memory. Otherwise non-focal Cervical Nodes:  no anterior cervical adenopathy and no posterior cervical adenopathy.   Psych:  Subdued affect, otherwise  appropriate   Impression & Recommendations:  Problem # 1:  HIV INFECTION (ICD-042)  Pt aware that he has HIV and pt is not accompanied by daughter. Since his daughter is actively involved in the pt's life and with questions of early dementia, would like to communicate to pt's daughter about medical status. I asked pt and he said that he would tell her soon. I told pt that we would be happy to talk with her or tell her if he brought her to his follow-up appt.   I explained to pt that HIV is a very treatable disease and with good pt compliance, it is unlikely that the HIV will kill him. However, it is very important for him to follow-up and take his meds. I instructed pt on safe sex practices (abstinence ideal, but condoms necessary) and even if someone has HIV already, it can be harmful to have sex with them because it may increase resistance. Pt does not have any questions about his diagnosis.  Will obtain below labs for initial diagnosis of HIV. PPD to be placed on Monday and pt to have FLP on that day as pt had breakfast. Pt also to receive H1N1 and Pneuomovax vaccines today. Will hold off on genotype for now. If CD4/ VL within range of initiating treatment, will obtain genotype.  Orders: T-Chlamydia  Probe, urine (  [phone]) T-GC Probe, urine [phone]) T-Comprehensive Metabolic Panel ([id number]) T-CBC w/Diff [phone]) T-CD4 [phone]) T-Viral Load [phone]) T-Urinalysis [phone]) T-Hepatitis Profile Acute [id number] Orders: T-Lipid Profile [id number]) ... 08/29/2008  Problem # 2:  HYPERTENSION (ICD-401.9) Pt's BP still well above goal. Checking CMET along with other labs and will increase lisinopril to 40 mg daily. Will cont to follow. Pt to come back in two weeks for BP check.  His updated medication list for this problem includes:    Hydrochlorothiazide 25 Mg Tabs (Hydrochlorothiazide) .Marland Kitchen... Take 1 tablet by mouth once a day    Amlodipine Besylate 10 Mg  Tabs (Amlodipine besylate) .Marland Kitchen... 1 tablet by mouth daily    Lisinopril 40 Mg Tabs (Lisinopril) .Marland Kitchen... Take 1 tablet by mouth once a day  BP today: 165/95 Prior BP: 190/127 (08/08/2008)  Labs Reviewed: Creat: 1.36 (06/24/2008) Chol: 198 (06/24/2008)   HDL: 34 (06/24/2008)   LDL: 150 (06/24/2008)   TG: 72 (06/24/2008)  Orders: T-Comprehensive Metabolic Panel ([id number])  Problem # 3:  MEMORY LOSS (ICD-780.93) Per pt, says it has improved. Pt not accompanied by daughter and I hadn't seen him previously. Will continue to follow.  Complete Medication List: 1)  Hydrochlorothiazide 25 Mg Tabs (Hydrochlorothiazide) .... Take 1 tablet by mouth once a day 2)  Amlodipine Besylate 10 Mg Tabs (Amlodipine besylate) .Marland Kitchen.. 1 tablet by mouth daily 3)  Lisinopril 40 Mg Tabs (Lisinopril) .... Take 1 tablet by mouth once a day 4)  Pravachol 40 Mg Tabs (Pravastatin sodium) .... Take 1 tab by mouth at bedtime  Other Orders: H1N1 vaccine G code [id number]) Influenza A (H1N1) adm  fee Medicare/Non Medicare [id number]) Pneumococcal Vaccine [id number]) Admin 1st Vaccine [phone])  Patient Instructions: 1)  Please schedule a follow-up appointment in 2 weeks for BP check and to discuss your labwork. 2)  Please come this Monday to have a PPD placed for a Tuberculosis check. Please come fasting so that we can get your cholesterol checked. 3)  Please increase your dose of Lisinopril to 40 mg/ day and we have provided you a prescription for this. 4)  Please do not hesitate to call us [phone] if you need anything. Prescriptions: LISINOPRIL 40 MG TABS (LISINOPRIL) Take 1 tablet by mouth once a day  #30 x 3   Entered and Authorized by:   Darlyne Russian MD   Signed by:   Darlyne Russian MD on 08/28/2008   Method used:   Print then Give to Patient   RxID:   [id number]    Pneumovax Vaccine    Vaccine Type: Pneumovax    Site: right deltoid    Mfr: Merck    Dose: 0.5 ml    Route: IM    Given by: Hilda Blades Ditzler RN     Exp. Date: 07/25/2009    Lot #: [id number]    VIS given: 01/16/96 version given August 28, 2008.  Flu Vaccine Consent Questions    Do you have a history of severe allergic reactions to this vaccine? no    Any prior history of allergic reactions to egg and/or gelatin? no    Do you have a sensitivity to the preservative Thimersol? no    Do you have a past history of Guillan-Barre Syndrome? no    Do you currently have an acute febrile illness? no    Have you ever had a severe reaction to latex? no    Vaccine information given and explained to patient? yes  H1N1 # 1    Vaccine Type: H1N1 vaccine  G code    Site: left deltoid    Mfr: [place]    Dose: 0.5 ml    Route: IM    Given by: Hilda Blades Ditzler RN    Exp. Date: 10/14/2009    Lot #: [id number]    VIS given: 03/20/2009 given August 28, 2008.

## 2010-07-20 NOTE — Miscellaneous (Signed)
Summary: HIPAA Restrictions  HIPAA Restrictions   Imported By: Bonner Puna 06/25/2008 14:57:03  _____________________________________________________________________  External Attachment:    Type:   Image     Comment:   External Document

## 2010-07-20 NOTE — Assessment & Plan Note (Signed)
Summary: f/u blisters on hand/hand//kg   Vital Signs:  Patient profile:   50 year old male Height:      67 inches Weight:      123.5 pounds BMI:     19.46 Temp:     9708 degrees F oral Pulse rate:   74 / minute BP sitting:   95 / 70  (left arm) Cuff size:   REGULATR  Vitals Entered By: Lucky Rathke NT II (November 19, 2008 2:47 PM) CC:  FOLLOW UP VISIT / ? BLOOD WORK Is Patient Diabetic? No Pain Assessment Patient in pain? no      Nutritional Status BMI of < 19 = underweight  Have you ever been in a relationship where you felt threatened, hurt or afraid?No   Does patient need assistance? Functional Status Self care Ambulation Normal Comments FOLLOW UP / ? BLOOD WORK   Primary Care Provider:  Alphia Moh MD  CC:   FOLLOW UP VISIT / ? BLOOD WORK.  History of Present Illness: Peter Cox is a 50 yo man with PMH as outlined in chart.  He is here today with his mother to follow up on blisters on feet and hands.  They are resolving.  Currently has dry, scaly skin in areas.  No new blisters. No other symptoms such as burning or pruritis.  No erythema or signs of infection.  No rash/blisters anywhere else.  Using topical anti-fungals.  Seen last visit and instructed to stop lamisil given no onychomycosis and possible hepatic toxicity.  Otherwise doing well, all other systems reviewed and negative.   Preventive Screening-Counseling & Management  Alcohol-Tobacco     Alcohol drinks/day: 0     Smoking Status: current     Smoking Cessation Counseling: yes     Packs/Day: 0.25     Year Started: < 7 CIG A DAY  Caffeine-Diet-Exercise     Does Patient Exercise: yes     Type of exercise: WALKING     Times/week:  2-3  Current Medications (verified): 1)  Hydrochlorothiazide 25 Mg Tabs (Hydrochlorothiazide) .... Take 1 Tablet By Mouth Once A Day 2)  Amlodipine Besylate 10 Mg Tabs (Amlodipine Besylate) .Marland Kitchen.. 1 Tablet By Mouth Daily 3)  Lisinopril 40 Mg Tabs (Lisinopril) .... Take 1  Tablet By Mouth Once A Day 4)  Pravachol 40 Mg Tabs (Pravastatin Sodium) .... Take 1 Tab By Mouth At Bedtime 5)  Prezista 400 Mg Tabs (Darunavir Ethanolate) .... Take 2 Tabs By Mouth Once Daily. 6)  Norvir 100 Mg Caps (Ritonavir) .... Take 1 Tab By Mouth Once Daily. 7)  Truvada 200-300 Mg Tabs (Emtricitabine-Tenofovir) .... Take 1 Tab By Mouth Once Daily. 8)  Bayer Aspirin Ec Low Dose 81 Mg Tbec (Aspirin) .... Take 1 Tablet By Mouth Once A Day  Allergies: No Known Drug Allergies  Past History:  Past Surgical History: Last updated: 06-27-08 Left shoulder arthroscopy w/ extensive debridement (01/2008 by Dr. Mardelle Matte) Right knee patellar reduction Hemorrhoidectomy  Family History: Last updated: 27-Jun-2008 Mother deceased (35yo):  CVA, HTN, DM Father deceased (87yo):  DM 2 sisters alive:  HTN in both 1 brother alive:  healthy  Social History: Last updated: 10/28/2008 Lives in [place] with male friend Dirk Dress), they have been together x 20 yrs but are no longer sexually active. Not working Current Smoker:  1/2  pack/month x 17 years (more in past) Alcohol use-no (past, clean x 4 years) Drug use - yes, THC  Risk Factors: Smoking Status: current (11/19/2008) Packs/Day:  0.25 (11/19/2008)  Past Medical History: HIV dx 08/08/2008 (unknown how he was exposed)      Initial viral load:  43,200 (08/28/08)      Current Viral Load:  576       Initial CD4:  320 (08/28/08)      Current Cd4:  410           10/28/08 and 08/08/08:  RPR negative           08/28/08:  Hep B & C, GC, Chlamydia           TB skin test ordered but not done.            08/28/08:  pneumovax given  HTN tobacco abuse Marijuana use Previous cocaine, heavy, now clean since about 2000. Previous alcohol, heavy, now clean since about 2005. Left shoulder post-traumatic OA Mild cognitive impairment (NL TSH, B12, folate):  Likely vascular dementia/psa/? HIV component      CT head 02/12/08 for hypertensive crisis and concern  for stroke:      IMPRESSION:        1.  Old right internal capsule and thalamic lacunar infarcts.        2.  Mild to moderate chronic small vessel white matter ischemic             changes in both cerebral hemispheres, greater on the right.  Review of Systems      See HPI  Physical Exam  General:  Very thin middle-aged man in NAD. He appears withdrawn and lets his mother do most of the talking. Eyes:  vision grossly intact, pupils equal, pupils round, pupils reactive to light, and no injection. Bilateral pinguecula. Lungs:  normal respiratory effort, no accessory muscle use. Msk:  no joint swelling and no joint warmth.   Extremities:  No edema Neurologic:  Pt is awake and alert although he is very withdrawn. cranial nerves II-XII intact and gait normal.   Skin:  No blisters, rashes or erythema.  There are areas of dry, scaly skin in location of prior blisters.   Impression & Recommendations:  Problem # 1:  FOOT&TOE BLISTER WITHOUT MENTION OF INFECTION (ICD-917.2) Blisters have now resolved Has dry scaly lesions that are resolving Currently looks like tinea as etiology, although there was concern of atripla as etiology Will continue with topical antifungal and follow  Problem # 2:  HIV INFECTION (ICD-042) Atripla d/c'd 2/2 above. currently on regimen listed below. CD4 and VL checked last month, repeat in another 2 months (01/2009).  Problem # 3:  HYPERTENSION (ICD-401.9) Well controlled currently. Pt denies any orthostatic symptoms. Will continue current regimen, but if BP remains at current level, would decrease lisinopril to 20mg  by mouth daily.  His updated medication list for this problem includes:    Hydrochlorothiazide 25 Mg Tabs (Hydrochlorothiazide) .Marland Kitchen... Take 1 tablet by mouth once a day    Amlodipine Besylate 10 Mg Tabs (Amlodipine besylate) .Marland Kitchen... 1 tablet by mouth daily    Lisinopril 40 Mg Tabs (Lisinopril) .Marland Kitchen... Take 1 tablet by mouth once a day  BP today:  95/70 Prior BP: 134/96 (10/28/2008)  Labs Reviewed: K+: 3.9 (08/28/2008) Creat: : 1.25 (08/28/2008)   Chol: 176 (09/01/2008)   HDL: 35 (09/01/2008)   LDL: 122 (09/01/2008)   TG: 96 (09/01/2008)  Complete Medication List: 1)  Hydrochlorothiazide 25 Mg Tabs (Hydrochlorothiazide) .... Take 1 tablet by mouth once a day 2)  Amlodipine Besylate 10 Mg Tabs (Amlodipine besylate) .Marland Kitchen.. 1 tablet by mouth  daily 3)  Lisinopril 40 Mg Tabs (Lisinopril) .... Take 1 tablet by mouth once a day 4)  Pravachol 40 Mg Tabs (Pravastatin sodium) .... Take 1 tab by mouth at bedtime 5)  Prezista 400 Mg Tabs (Darunavir ethanolate) .... Take 2 tabs by mouth once daily. 6)  Norvir 100 Mg Caps (Ritonavir) .... Take 1 tab by mouth once daily. 7)  Truvada 200-300 Mg Tabs (Emtricitabine-tenofovir) .... Take 1 tab by mouth once daily. 8)  Bayer Aspirin Ec Low Dose 81 Mg Tbec (Aspirin) .... Take 1 tablet by mouth once a day  Patient Instructions: 1)  Please schedule a follow-up appointment in 3 months. 2)  Follow up with Dr. Tomma Lightning. 3)  Continue your medications listed below. 4)  Will draw the CBC and CMET today, CD4 and viral load already done. 5)  If you have any problems or need refills before your next visit, call clinic.

## 2010-08-18 ENCOUNTER — Encounter: Payer: Self-pay | Admitting: Internal Medicine

## 2010-09-03 LAB — T-HELPER CELLS (CD4) COUNT (NOT AT ARMC)
CD4 % Helper T Cell: 38 % (ref 33–55)
CD4 T Cell Abs: 330 uL — ABNORMAL LOW (ref 400–2700)

## 2010-09-22 LAB — T-HELPER CELLS (CD4) COUNT (NOT AT ARMC)
CD4 % Helper T Cell: 46 % (ref 33–55)
CD4 T Cell Abs: 920 uL (ref 400–2700)

## 2010-09-28 LAB — T-HELPER CELLS (CD4) COUNT (NOT AT ARMC)
CD4 % Helper T Cell: 38 % (ref 33–55)
CD4 T Cell Abs: 410 uL (ref 400–2700)

## 2010-09-28 LAB — RPR: RPR Ser Ql: NONREACTIVE

## 2010-09-30 LAB — T-HELPER CELLS (CD4) COUNT (NOT AT ARMC)
CD4 % Helper T Cell: 41 % (ref 33–55)
CD4 T Cell Abs: 320 uL — ABNORMAL LOW (ref 400–2700)

## 2010-11-02 NOTE — H&P (Signed)
Peter Cox, Peter Cox NO.:  [id number]   MEDICAL RECORD NO.:  [id number]          PATIENT TYPE:  INP   LOCATION:  2104                         FACILITY:  [facility]   PHYSICIAN:  Jacquelynn Cree, M.D.   DATE OF BIRTH:  Apr 13, 1961   DATE OF ADMISSION:  02/12/2008  DATE OF DISCHARGE:                              HISTORY & PHYSICAL   PRIMARY CARE PHYSICIAN:  The patient denies that he has one at present.   HISTORY OF PRESENT ILLNESS:  The patient is a 50 year old male, who was  admitted to the [facility] by Dr. Mardelle Matte for elective left  shoulder stabilization.  Apparently, during surgery, he developed  markedly elevated blood pressures which was treated with several doses  of labetalol (the patient received a total of 50 mg labetalol).  He was  also given oral dose of hydrochlorothiazide 25 mg and despite these  measures, his blood pressure continues to be markedly elevated with  current pressure of 141/135.  The patient denies any headache or  disorientation.  According to a progress note written on January 31, 2008, the patient had a bizarre affect when evaluated for this surgery  and there was some concern that he may have been intoxicated of iron  drugs at that time.  Apparently after the surgery, he had the same  affect and there has been concern of some illicit drug use.  Nevertheless, the patient clearly is in hypertensive crisis and  therefore, we will admit him for further evaluation and treatment.   PAST MEDICAL HISTORY:  1. Hypertension.  2. Arthritis.   PAST SURGICAL HISTORY:  1. Orthopedic surgery left arm and shoulder.  2. Right knee patellar reduction.  3. Hemorrhoidectomy.   SOCIAL HISTORY:  The patient is single.  He is disabled.  He admits to  smoking half of a pack of tobacco daily and has done so for 35 years.  He is sketchy regarding his alcohol use.  He says he does not drink, but  then says he has drunk in the past.  He denies that he  has ever had a  problem with alcohol abuse.  He admits to using cocaine over 5 years  ago, but no recent use.   FAMILY HISTORY:  The patient's mother died in her 73s of stroke and  hypertension.  The patient's father died in his [id number] of uncertain causes,  but also was hypertensive.  He has two sisters, one with diabetes and  the other with hypertension.  He has two healthy offsprings.   REVIEW OF SYSTEMS:  The patient denies any fever or chills.  He denies  changes in his appetite, but does report a 20-pound weight loss over the  past 6 months.  He denies anxiety or tremulousness.  He denies  constipation, diarrhea, nausea, vomiting, chest pain, shortness of  breath, cough, or focal neurologic deficits.  He denies visual changes.   MEDICATIONS:  1. Hydrochlorothiazide 25 mg daily (the patient cannot tell me who      prescribed this for him).  2. Aspirin 81 mg daily.  ALLERGIES:  None.   PHYSICAL EXAMINATION:  VITAL SIGNS:  Afebrile with a blood pressure of  160/114, pulse 90, respirations 14, O2 saturation 95% on 2 liters.  GENERAL:  Lethargic, thin black male, who is in no acute distress.  HEENT:  Normocephalic, atraumatic.  PERRL.  EOMI.  There is arcus  senilis bilaterally.  Oropharynx clear.  Tongue is midline.  Palate  rises symmetrically.  NECK:  Supple, no thyromegaly, no lymphadenopathy, no jugular venous  distention.  CHEST:  Decreased breath sounds, but clear bilaterally.  HEART:  Regular rate, rhythm.  No murmurs, rubs, or gallops.  ABDOMEN:  Soft, nontender, nondistended with normoactive bowel sounds.  EXTREMITIES:  No clubbing, edema, or cyanosis.  SKIN:  Warm and dry.  No rashes.  NEUROLOGIC:  The patient is lethargic.  He follows commands sluggishly.  He is oriented x3 with the exception of missing the date by 2 days.  He  knows the month, year, and the fact that Mady Gemma is the president.  He  knew which hospital he was currently located in.  Moves all extremities   with equal strength bilaterally.   DATA REVIEW:  White blood cell count is 4.2, hemoglobin 11.1, hematocrit  33.3, platelets 230.  Sodium is 136, potassium 4.3, chloride 108, bicarb  25, BUN 16, creatinine 1.27, glucose 58.   A 12-lead EKG shows sinus bradycardia with left ventricular hypertrophy.   Chest x-ray shows no acute cardiopulmonary disease.   ASSESSMENT/PLAN:  1. Altered mental status postoperatively:  This is likely due to      hypertensive encephalopathy.  We will admit the patient to the      Intensive Care Unit and start him on a nitroprusside drip.  We will      add clonidine and Norvasc to his hydrochlorothiazide for blood      pressure control.  We will obtain a CT scan of the head given the      fact that he has been on aspirin as an outpatient.  Rule out      hemorrhagic cerebrovascular accident.  We will check a urine drug      screen given his history of cocaine abuse and treatment with      labetalol.  We will check a 12-lead EKG to monitor for any changes      from his preoperative EKG.  We will check a two-dimensional      echocardiogram given his left ventricular hypertrophy on 12-lead      EKG.  We will cycle cardiac markers if the first set is positive.  2. Abnormal weight loss:  Check the patient's TSH.  3. Tobacco abuse:  Counsel on cessation when awake and alert.  He will      use a nicotine patch if needed.  4. Prophylaxis:  The patient is a young healthy male who does not need      stress ulcer prophylaxis.  Given his malignant hypertension, we      would not use any additional blood thinners.  We will encourage      early ambulation for DVT prophylaxis.      Jacquelynn Cree, M.D.  Electronically Signed     CR/MEDQ  D:  02/12/2008  T:  02/13/2008  Job:  [id number]

## 2010-11-02 NOTE — Op Note (Signed)
Peter Cox, Peter Cox NO.:  [id number]   MEDICAL RECORD NO.:  [id number]          PATIENT TYPE:  INP   LOCATION:  2104                         FACILITY:  [facility]   PHYSICIAN:  Johnny Bridge, MD    DATE OF BIRTH:  07-02-1960   DATE OF PROCEDURE:  02/12/2008  DATE OF DISCHARGE:                               OPERATIVE REPORT   PREOPERATIVE DIAGNOSIS:  Left shoulder post-traumatic osteoarthritis.   POSTOPERATIVE DIAGNOSIS:  Left shoulder post-traumatic osteoarthritis.   OPERATIVE PROCEDURE:  Left shoulder arthroscopy with extensive  debridement.   ANESTHESIA:  General with interscalene block.   PREOPERATIVE INDICATIONS:  [name] is a 50 year old African  American male who had a left shoulder stabilization procedure many years  ago.  He presented with ongoing complaints of catching and severe pain  with even gentle range of motion.  He elected to undergo the above-named  procedures.  The risks, benefits, and alternatives including but not  limited to risks of infection, bleeding, nerve injury, recurrent  stiffness, recurrent dislocation, progression of arthritis, need for  future arthroplasty, cardiopulmonary complications, incomplete relief of  pain, and among others and he was willing to proceed.   OPERATIVE FINDINGS:  The anterior half of the glenoid had fairly severe  grade 4 arthritis.  The posterior half was relatively well maintained.  There was a fair number of free chondral fragments that were debrided.  The humeral head itself was fairly denuded and did not have very much  remaining cartilage.  The previous Bankart repair appeared to be intact,  although it was somewhat medial along the neck, but I did not see  evidence for recurrent tear.  The rotator cuff was intact.  The biceps  tendon had grade 15% wear.  The subscapularis appeared to be still  attached to the humerus, although it was not quite in its normal  anatomic position, but probably  from the previous open repair.  The  subscapularis did, however, appeared to be still attached to the  humerus.   OPERATIVE PROCEDURE:  The patient was brought to the operating room and  placed in a supine position.  General anesthesia was administered.  Regional block had already been performed in the holding area.  The left  upper extremity was prepped and draped in the usual sterile fashion.  The lateral position was utilized.  Ten pounds of traction were applied.  Exam under anesthesia prior to the prep and drape demonstrated full  range of motion with fairly extensive hyperlaxity.  I could not  dislocate him completely, either at the front or the back.  He was  fairly flexible, however.  He had no significant adhesions.   Diagnostic arthroscopy was carried out after sterile prep and drape.  Lavage of the joint was performed as well as removal of all the free  chondral pieces.  The biceps tendon was inspected thoroughly and  consideration was given to biceps tenodesis, however, only about 15% of  the biceps tendon was involved and this was actually mostly the portion  of the tendon that was in the  groove proximally, rather than the tissue  in the joint itself.  Therefore, we debrided the biceps tendon and  debrided the free chondral tissue and placed the scope anteriorly and  inspected the Bankart repair.  Some of the sutures were present, and  these were debrided as well.  I did not see any evidence for prominent  screw heads from the previous metallic anchors.   The joint was lavaged once more, and the portals closed with Monocryl  followed by Steri-Strips.  The patient was awakened and extubated and  returned to PACU in stable and satisfactory condition.  There were no  complications.  The patient tolerated the procedure well.      Johnny Bridge, MD  Electronically Signed     JPL/MEDQ  D:  02/12/2008  T:  02/13/2008  Job:  [id number]

## 2010-11-05 NOTE — Op Note (Signed)
NAME:  Peter Cox, Peter Cox                          ACCOUNT NO.:  [id number]   MEDICAL RECORD NO.:  [id number]                   PATIENT TYPE:  AMB   LOCATION:  [place]                                  FACILITY:  [facility]   PHYSICIAN:  Coralie Keens, M.D.              DATE OF BIRTH:  1961/02/27   DATE OF PROCEDURE:  09/24/2002  DATE OF DISCHARGE:                                 OPERATIVE REPORT   PREOPERATIVE DIAGNOSIS:  1. Hemorrhoids.  2. Rectal prolapse.   POSTOPERATIVE DIAGNOSIS:  1. Hemorrhoids.  2. Rectal prolapse.   OPERATION PERFORMED:  1. Examination under anesthesia.  2. Hemorrhoidectomy.   SURGEON:  Douglas A. Ninfa Linden, M.D.   ANESTHESIA:  General endotracheal and 0.25% Marcaine with epinephrine.   INDICATIONS FOR PROCEDURE:  The patient is a 50 year old gentleman with  rectal prolapse as well as bleeding hemorrhoids.  At this point decision has  been made to proceed to the operating room for examination under anesthesia  and hemorrhoidectomy.  He has refused right now referral to a tertiary care  center for operation on his prolapse.   DESCRIPTION OF PROCEDURE:  The patient was brought to the operating room and  identified.  He was placed supine on the operating table and general  anesthesia was induced.  The patient was then placed in a prone position.  His perianal area was then prepped and draped in the usual sterile fashion.  The patient did have a large hemorrhoidal column on the right side.  A Hill-  Ferguson retractor was inserted into the anal canal.  The anal canal was  very loose, with redundant mucosa.  A circumferential examination was  performed and no evidence of carcinoma or any pathology was identified.  The  large hemorrhoidal column was then identified and grasped with an Allis  clamp.  A 2-0 chromic suture was then placed proximally in the anal canal.  The large column was then excised completely with the electrocautery.  The  mucosa was then  reapproximated with the running interlocked 2-0 chromic  suture.  The hemorrhoid was sent to pathology for identification.  A  separate small skin tag was then excised with the electrocautery.  Again  circumferential examination was performed and hemostasis appeared to be  achieved and again no other pathology was identified.  At this point a piece  of Gelfoam was inserted into the anal canal.  The perianal area was  anesthetized with 0.25% Marcaine with epinephrine.  The patient was then  turned back into the supine position and then extubated in the operating  room and taken in stable condition to the recovery room.  Coralie Keens, M.D.    DB/MEDQ  D:  09/24/2002  T:  09/24/2002  Job:  [id number]

## 2010-11-05 NOTE — Discharge Summary (Signed)
Peter Cox, Peter Cox                ACCOUNT NO.:  [id number]   MEDICAL RECORD NO.:  [id number]          PATIENT TYPE:  INP   LOCATION:  6741                         FACILITY:  [facility]   PHYSICIAN:  Adele Barthel, MD    DATE OF BIRTH:  [birth year]   DATE OF ADMISSION:  02/12/2008  DATE OF DISCHARGE:  02/16/2008                               DISCHARGE SUMMARY   PRIMARY CARE PHYSICIAN:  The patient is undersigned, does not have a PCP  at this time.   ADMISSION HISTORY:  Please refer to the admit note by Dr. Jacquelynn Cree, under history of present illness.   DISCHARGE DIAGNOSES:  1. Hypertension.  2. Tobacco abuse.  3. Arthritis.   DISCHARGE MEDICATIONS:  1. Hydrochlorothiazide 50 mg p.o. daily.  2. Amlodipine 10 mg p.o. daily.  3. Clonidine 0.2 mg p.o. q.12 h.  4. Lisinopril 20 mg p.o. daily.  5. Hydralazine 50 mg p.o. q.6 h.   HOSPITAL COURSE:  Hypertension.  The patient is a 50 year old gentleman  who had very very challenging blood pressure to control, raising  suspicion for secondary causes of hypertension especially renal artery  stenosis.  The patient is recommended to follow up with the primary care  physician to arrange MRA to look for renal artery stenosis given that he  is on more than 4 antihypertensives to control recurrent hypertension.   PROCEDURE PERFORMED:  None.   CONSULTATION PERFORMED:  None.   DISPOSITION:  The patient will establish primary care physician with  whom he is going to follow up with MRA for renal artery stenosis, and  also follow up with the BMP in 1 week time and blood pressure management  further.   TOTAL TIME SPENT IN THIS DISCHARGE:  35 minutes.      Adele Barthel, MD  Electronically Signed     NP/MEDQ  D:  04/10/2008  T:  04/11/2008  Job:  [id number]

## 2010-11-05 NOTE — Discharge Summary (Signed)
   NAME:  Peter Cox, Peter Cox                          ACCOUNT NO.:  [id number]   MEDICAL RECORD NO.:  [id number]                   PATIENT TYPE:  INP   LOCATION:  [id number]                                 FACILITY:  [facility]   PHYSICIAN:  Luane School, M.D.                DATE OF BIRTH:  1960/07/06   DATE OF ADMISSION:  08/03/2002  DATE OF DISCHARGE:  08/13/2002                                 DISCHARGE SUMMARY   DISCHARGE DIAGNOSES:  1. Rectal prolapse with hemorrhoids.                                               Luane School, M.D.    KC/MEDQ  D:  08/18/2002  T:  08/18/2002  Job:  [id number]   cc:   Coralie Keens, M.D.  [id number]  [place]  [place] [address]  Fax: [phone]

## 2010-11-05 NOTE — Discharge Summary (Signed)
NAME:  Peter Cox, Peter Cox                          ACCOUNT NO.:  [id number]   MEDICAL RECORD NO.:  [id number]                   PATIENT TYPE:  INP   LOCATION:  [id number]                                 FACILITY:  [facility]   PHYSICIAN:  Luane School, M.D.                DATE OF BIRTH:  31-Jul-1960   DATE OF ADMISSION:  08/03/2002  DATE OF DISCHARGE:  08/13/2002                                 DISCHARGE SUMMARY   DISCHARGE DIAGNOSES:  1. Rectal prolapse secondary to hemorrhoids.  2. Syncope.  3. Anemia.  4. Polysubstance abuse.  5. Ammonia.   PAST MEDICAL HISTORY:  Left arm work-related injury requiring surgical  intervention in 1995.   CONSULTATIONS:  1. Gastroenterology.  2. Surgery, Coralie Keens, M.D.   PROCEDURES:  1. CT scan of the head without contrast media was negative on August 03, 2002.  2. CT of the abdomen and pelvis on August 03, 2002, showed large prolapse     of the rectum and otherwise no acute abnormalities.  3. CT of the pelvis without contrast media showed findings consistent with     proctitis and no evidence of perirectal abscess.  4. Esophagogastroduodenoscopy on August 10, 2002, showed normal     examination.  5. Colonoscopy on August 10, 2002, showed rectal prolapse.   HISTORY OF PRESENT ILLNESS:  This 50 year old black male with a history of  as noted above presented to the [facility] with an  episode of bright red blood per rectum after to episodes of diarrhea and  decreased level of consciousness by the family.  The patient said that he  had a cold for two weeks productive of green sputum, as well as fever, night  sweats, and chills.  He denies any shortness of breath.  He took some  antibiotics that he received from his doctor in 1995 for his arm injury and  Thera-Flu with some relief.  At 2 a.m. on the morning of admission, he  experienced two watery stools followed by several, approximately seven,  episodes of  bright red blood per rectum which he attributed to flare up of  his hemorrhoids.  The patient began feeling weak and apparently passed out  at his daughter's home.  When EMS arrived, the patient was responsive to  verbal stimuli, however, was somewhat confused.  The CBG was 109.  Of note,  the patient was drinking heavily and partying the night before.   ALLERGIES:  No known drug allergies.   SOCIAL HISTORY:  The patient smokes a third of a packs of cigarettes a day  for the past 30 years.  He does have a history of binge alcohol drinking.  He denies cocaine use, although his cocaine was positive on admission.  He  also denies IV drug use.  He is single.  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  He has Medicaid.  He lives with his girlfriend.   FAMILY HISTORY:  His mother has diabetes and hypertension and is 58 years  old.  His father died of complications from diabetes and a question of  cancer at age 25.  He has 20 and 52 year old girls.  One has sickle cell  trait.   PHYSICAL EXAMINATION:  VITAL SIGNS:  Pulse 72, blood pressure 167/88,  respirations 18, O2 saturation 97% on room air.  GENERAL APPEARANCE:  The patient was alert and oriented when we saw him.  He  was lying supine in his bed.  HEENT:  Eyelids are edematous.  Pupils equal, round, and reactive to light  and accomodation.  The sclerae were white.  The patient had poor dentition  and an erythematous oropharynx.  HEART:  Regular rate and rhythm.  LUNGS:  Clear to auscultation.  ABDOMEN:  Nontender and nondistended.  Positive bowel sounds.  The patient  had severe rectal prolapse.  EXTREMITIES:  No lower extremity edema.   LABORATORY DATA:  Sodium 133, potassium 3.5, chloride 104, bicarbonate 24,  BUN 9, creatinine 1, glucose 88, alkaline phosphatase 99, AST 22, total  bilirubin 0.4, ALT 18, albumin 3.4, lipase 28.  White count 7.3, hemoglobin  9.3, MCV 58, RDW 19.6, platelets 264.  The alcohol level was 223.  The  urine  drug screen was positive for cocaine.  The chest x-ray was negative.  Head  CT was negative as noted above.   HOSPITAL COURSE:  #1 - RECTAL PROLAPSE:  The patient said that he has had  episodes of rectal prolapse since his mid 40s.  He says that normally he is  able to reduce it on his own, however, this time he was unable to reduce it.  The abdominal CT obtained showed rectal prolapse, however, no other  abnormalities were seen.  We consulted general surgery who reduced the  hemorrhoids and recommended Anusol suppositories and sitz baths for the  hemorrhoids.  They said that the patient may need a GI consult for rectal  bleeding.  Regarding the prolapse, the patient will need referral at [facility]  or [facility] for colorectal specialist and he requires surgical correction of the  prolapse.  We also consulted GI, who performed an EDG and a colonoscopy with  the results as noted above.  The patient continued to have several episodes  of bleeding throughout his hospitalization.  However, he says that this is  unchanged from normal.  We provided him pain medicines for rectal pain.  The  patient did develop tenderness in the rectal area.  We therefore obtained  another CT looking specifically for a perirectal abscess.  The CT did not  show an abscess, however, it did show proctitis.  We therefore covered the  patient with Zosyn, which also covered a pneumonia that he developed during  his hospitalization on day #4.  The patient will need surgical follow-up for  his hemorrhoid surgery as this will continue to be a problem until it is  corrected.  The patient may need to be referred to an outside hospital, such  as [facility] or [facility].  However, we will have him follow up with Coralie Keens, M.D., and his team as an outpatient.   #2 - SYNCOPE:  As noted in the HPI, the patient came in because his daughter  found him somewhat unresponsive at home.  Further questioning revealed that the patient  had been drinking heavily the night before.  His alcohol  level  was greater than 200 on admission.  On talking with the daughter, she says  that his behavior was similar to that seen with intoxification.  The  patient's hemoglobin was 9.3 on admission, however, it stayed stable for  several days before trending down towards the end of his hospitalization.  His blood pressure was also stable throughout the hospitalization.  By the  time we saw the patient, he was more alert.  Throughout the rest of his  hospitalization, he had no episodes of syncope.  Of note, his head CT was  negative and he had no arrhythmias seen on telemetry.   #3 - ANEMIA:  The patient's hemoglobin was only 9.3 when he came into the  hospital.  However, his MCV was 58, which was somewhat out of proportion of  the level of anemia.  Because his daughter does have sickle cell trait, we  were concerned for sickle cell or thalassemia as the source, as well as the  anemia.  We did check a hemoglobin electrophoresis of which the patient had  no hemoglobin S or F, however, he had 98.1% hemoglobin A and 1.9% hemoglobin  A2.  Of note, patients was a combination of iron deficiency anemia and beta  thalassemia and may clinically present with normal A2 levels.  Therefore, an  elevated hemoglobin A12 cannot be used to screen for beta thalassemia in  these cases.  The patient's iron was less than 10.  Therefore, his TIBC and  percent saturations were not able to be calculated.  His ferritin level was  5.  Therefore, there was some component of iron deficiency anemia.  We  provided the patient with iron tablets 325 mg twice daily with meals.  Also  of note, on his peripheral smear, prenatal elliptocytes and target red blood  cells were seen.  The patient's anemia will need to be followed up closely  as an outpatient.  He does complain of constant bleeding from his rectum via  his hemorrhoids.  Therefore again, the hemorrhoids will need  to be corrected  surgically.  Although the patient's hemoglobin was 9.3 on admission, it did  slowly trend down and reached a nadir of 6.9 on August 10, 2002, which was  seven days after admission.  The patient did receive one blood transfusion  prior to being discharged, which brought his hemoglobin from 7.9 to 10.8.  Again, this should be rechecked as an outpatient.   #4 - POLYSUBSTANCE ABUSE:  The patient came in with an alcohol level as  noted above at greater than 200 and urine drug screen positive for cocaine,  although he continued to deny the use of cocaine throughout the  hospitalization.  The patient stated that the cocaine was in his alcohol.  We did get social work involved and provided him information with ADS, as  well as providing a teaching sheet concerning both alcohol and drugs.  The  patient was given the number for ADS, as well as other substance abuse  treatment resources to use if he chooses.  #5 - PNEUMONIA:  One day during admission, the patient developed a white  count as high as 13.4.  He also had associated cough productive of green  sputum in addition to his rectal pain.  We were unsure as to whether or not  the pain and the white count were secondary to a pneumonia or from a rectal  abscess.  We obtained a CT of the rectum, as noted above, which  showed a  proctitis, however, showed no evidence of abscess.  The patient did have an  associated fever with this as high as 102.2 degrees.  We empirically treated  him with Zosyn which would cover both community-acquired and hospital-  acquired pneumonia, as well as any proctitis that was seen on CT.  We did  get a chest x-ray on August 06, 2002, which showed patch airspace opacity  in the left lower lobe suspicious for early pneumonia.  We treated him for a  total of seven days with antibiotics.   DISCHARGE LABORATORY DATA:  White count 9.8, hemoglobin 10.8, MCV 64.7, RDW  26.3, platelets 374.  Sodium 141,  potassium 3.9, chloride 108, bicarbonate  27, glucose 81, BUN 5, creatinine 1.2, calcium 8.9.  Urine cultures and  blood cultures were all negative.  The sputum culture showed normal  oropharyngeal flora.                                               Luane School, M.D.    KC/MEDQ  D:  08/18/2002  T:  08/18/2002  Job:  [id number]   cc:   Coralie Keens, M.D.  [id number]  [place]  [place] [address]  Fax: [phone]

## 2010-11-05 NOTE — Consult Note (Signed)
NAME:  Peter Cox, Peter Cox                          ACCOUNT NO.:  [id number]   MEDICAL RECORD NO.:  [id number]                   PATIENT TYPE:  INP   LOCATION:  [id number]                                 FACILITY:  [facility]   PHYSICIAN:  Raelyn Ensign. Vladimir Faster, M.D.            DATE OF BIRTH:  October 05, 1960   DATE OF CONSULTATION:  08/09/2002  DATE OF DISCHARGE:                                   CONSULTATION   REFERRING PHYSICIAN:  Dr. Thomes Lolling.   REASON FOR CONSULTATION:  The patient is a 50 year old male whom I am ask to  see by Dr. Clifton James for rectal bleeding and anemia.  The patient reports to me  that he has had rectal bleeding intermittently for over 10 years and  particularly in the last 2 to 3 years, with worsening in the last week.  He  reports that over 10 years ago, he was evaluated for prolapsed hemorrhoids  with rectal bleeding.  There is a question as to whether he had a prolapsed  rectum at that time as well.  Recently, he has been having more significant  rectal bleeding which is bright red in nature.  He has some rectal  discomfort and hemorrhoids that prolapse and an abdominal CT on admission  demonstrated a thickened rectum that appeared to be prolapsing through the  anal canal.  He was admitted after having had significant rectal bleeding  with syncope.  The significance of the bleeding is complicated by the fact  that he has been abusing alcohol and cocaine.  On admission, his hemoglobin  was 9.3 and it fell to 7 in the hospital.  He had watery diarrhea initially,  which has slowed.  Currently, he is only moving his bowels once per day but  they are still loose and there is red blood present.  He does not have any  dysphagia or upper abdominal pain or reflux.  He similarly does not have any  significant lower abdominal pain.  The discomfort is mostly located in the  rectal area.  He has not been losing weight.   PAST MEDICAL HISTORY:  Past medical history is pertinent for  recurrent  anemia, rectal bleeding, hemorrhoids, probable rectal prolapse and  polysubstance abuse.  In hospital, he is also being treated for a left lower  lobe pneumonia.   CURRENT MEDICATIONS:  1. Thiamine.  2. Folate.  3. Protonix 40 mg daily.  4. Colace.  5. Senokot.  6. Zosyn.  7. Iron.   ALLERGIES:  None reported.   FAMILY HISTORY:  Family history negative for colorectal neoplasia.   SOCIAL HISTORY:  Unemployed, smoking a third of a pack of cigarettes per  day, drinks at least 80 ounces of beer on weekend nights.   REVIEW OF SYSTEMS:  GENERAL:  No weight loss or night sweats.  ENDOCRINE:  No history of diabetes or thyroid problems.  SKIN:  No rash or pruritus.  EYES:  No icterus or change in vision.  ENT:  No aphthous ulcers or chronic  sore throat.  Positive for poor dentition.  RESPIRATORY:  Mild shortness of  breath with exertion.  CARDIAC:  No chest pain, palpitations or history of  valvular heart disease.  GI:  As above.  GU:  No dysuria or hematuria.  Remainder of review of systems is negative.   PHYSICAL EXAMINATION:  GENERAL:  On physical exam, he is a well-developed,  muscular, thin adult male in no acute distress.  VITAL SIGNS:  Afebrile, blood pressure 104/61, pulse 61 and regular.  HEENT:  Eyes are anicteric.  Conjunctivae pale.  Oropharynx:  Poor  dentition.  NECK:  Neck is supple without thyromegaly.  NODES:  There is no cervical or inguinal adenopathy.  CHEST:  Chest is clear.  HEART:  Heart sounds -- regular rate and rhythm.  ABDOMEN:  Abdomen muscular without mass, tenderness or organomegaly.  RECTAL:  Exam is not performed.  EXTREMITIES:  Extremities without cyanosis, clubbing, edema or rash.   LABORATORY TESTS:  Laboratory tests reveal current hemoglobin of 7.2.  Admission MCV was 58.  Folate normal, B12 736, ferritin 5, iron less than  12.  Hemoglobin electrophoresis normal.   IMPRESSION:  Profound iron deficiency anemia in an adult male with a  history  of rectal bleeding and prolapse of at least the hemorrhoids, if not the  entire rectal mucosa.  Colonoscopy is reasonable to assess this problem and  for other lower gastrointestinal pathology.  In addition, due to his degree  of anemia, I do believe that evaluating his upper tract for other reasons  for iron deficiency would be reasonable as well, particularly because of his  substance abuse.   PLAN:  Upper and lower endoscopy are suggested to the patient and reviewed  in terms of technique, preparation and risks of complications including  bleeding and perforation.  He agrees to proceed.  They will be scheduled for  the morning following Phospho-Soda and CoLyte prep this afternoon.  Please  see the orders.  Further recommendations will follow.  Iron should be held  until the colonoscopy is completed.  Since he has ongoing loose stool, I do  not see a reason for continuing Colace and Senokot.                                               Raelyn Ensign. Vladimir Faster, M.D.    PJS/MEDQ  D:  08/09/2002  T:  08/10/2002  Job:  [id number]   cc:   Thomes Lolling, M.D.  [address] [place]  [place] [address]  Fax: [phone]

## 2011-03-17 LAB — POCT I-STAT, CHEM 8
BUN: 21
Calcium, Ion: 1.15
Chloride: 104
Creatinine, Ser: 1.5
Glucose, Bld: 81
HCT: 38 — ABNORMAL LOW
Hemoglobin: 12.9 — ABNORMAL LOW
Potassium: 4
Sodium: 139
TCO2: 30

## 2011-03-17 LAB — POCT CARDIAC MARKERS
CKMB, poc: <1 — ABNORMAL LOW
Myoglobin, poc: 181
Operator id: 272551
Troponin i, poc: <0.05

## 2011-04-11 ENCOUNTER — Other Ambulatory Visit: Payer: Self-pay | Admitting: Licensed Clinical Social Worker

## 2011-04-11 ENCOUNTER — Other Ambulatory Visit (INDEPENDENT_AMBULATORY_CARE_PROVIDER_SITE_OTHER): Payer: Medicare Other

## 2011-04-11 ENCOUNTER — Other Ambulatory Visit: Payer: Self-pay | Admitting: Infectious Disease

## 2011-04-11 DIAGNOSIS — B20 Human immunodeficiency virus [HIV] disease: Secondary | ICD-10-CM

## 2011-04-11 DIAGNOSIS — Z113 Encounter for screening for infections with a predominantly sexual mode of transmission: Secondary | ICD-10-CM

## 2011-04-12 LAB — COMPLETE METABOLIC PANEL WITH GFR
ALT: 12 U/L (ref 0–53)
AST: 15 U/L (ref 0–37)
Albumin: 3.5 g/dL (ref 3.5–5.2)
Alkaline Phosphatase: 84 U/L (ref 39–117)
BUN: 22 mg/dL (ref 6–23)
CO2: 27 mEq/L (ref 19–32)
Calcium: 8.8 mg/dL (ref 8.4–10.5)
Chloride: 105 mEq/L (ref 96–112)
Creat: 1.38 mg/dL — ABNORMAL HIGH (ref 0.50–1.35)
GFR, Est African American: 67 mL/min — ABNORMAL LOW (ref >90)
GFR, Est Non African American: 58 mL/min — ABNORMAL LOW (ref >90)
Glucose, Bld: 75 mg/dL (ref 70–99)
Potassium: 4.1 mEq/L (ref 3.5–5.3)
Sodium: 140 mEq/L (ref 135–145)
Total Bilirubin: 0.3 mg/dL (ref 0.3–1.2)
Total Protein: 7.1 g/dL (ref 6.0–8.3)

## 2011-04-12 LAB — CBC WITH DIFFERENTIAL/PLATELET
Basophils Absolute: 0 10*3/uL (ref 0.0–0.1)
Basophils Relative: 1 % (ref 0–1)
Eosinophils Absolute: 0.3 10*3/uL (ref 0.0–0.7)
Eosinophils Relative: 6 % — ABNORMAL HIGH (ref 0–5)
HCT: 41.6 % (ref 39.0–52.0)
Hemoglobin: 14 g/dL (ref 13.0–17.0)
Lymphocytes Relative: 27 % (ref 12–46)
Lymphs Abs: 1.2 10*3/uL (ref 0.7–4.0)
MCH: 29.9 pg (ref 26.0–34.0)
MCHC: 33.7 g/dL (ref 30.0–36.0)
MCV: 88.7 fL (ref 78.0–100.0)
Monocytes Absolute: 0.3 10*3/uL (ref 0.1–1.0)
Monocytes Relative: 6 % (ref 3–12)
Neutro Abs: 2.6 10*3/uL (ref 1.7–7.7)
Neutrophils Relative %: 60 % (ref 43–77)
Platelets: 169 10*3/uL (ref 150–400)
RBC: 4.69 MIL/uL (ref 4.22–5.81)
RDW: 14.4 % (ref 11.5–15.5)
WBC: 4.4 10*3/uL (ref 4.0–10.5)

## 2011-04-12 LAB — T-HELPER CELL (CD4) - (RCID CLINIC ONLY)
CD4 % Helper T Cell: 40 % (ref 33–55)
CD4 T Cell Abs: 470 uL (ref 400–2700)

## 2011-04-12 LAB — GC/CHLAMYDIA PROBE AMP, URINE
Chlamydia, Swab/Urine, PCR: NEGATIVE
GC Probe Amp, Urine: NEGATIVE

## 2011-04-12 LAB — RPR

## 2011-04-13 LAB — HIV-1 RNA QUANT-NO REFLEX-BLD
HIV 1 RNA Quant: 50400 copies/mL — ABNORMAL HIGH (ref <20)
HIV-1 RNA Quant, Log: 4.7 {Log} — ABNORMAL HIGH (ref <1.30)

## 2011-04-27 ENCOUNTER — Ambulatory Visit (INDEPENDENT_AMBULATORY_CARE_PROVIDER_SITE_OTHER): Payer: Medicare Other | Admitting: Infectious Disease

## 2011-04-27 ENCOUNTER — Encounter: Payer: Self-pay | Admitting: Infectious Disease

## 2011-04-27 VITALS — BP 197/139 | HR 73 | Temp 97.8°F | Wt 134.0 lb

## 2011-04-27 DIAGNOSIS — I10 Essential (primary) hypertension: Secondary | ICD-10-CM

## 2011-04-27 DIAGNOSIS — Z23 Encounter for immunization: Secondary | ICD-10-CM

## 2011-04-27 DIAGNOSIS — K089 Disorder of teeth and supporting structures, unspecified: Secondary | ICD-10-CM

## 2011-04-27 DIAGNOSIS — F17200 Nicotine dependence, unspecified, uncomplicated: Secondary | ICD-10-CM

## 2011-04-27 DIAGNOSIS — B20 Human immunodeficiency virus [HIV] disease: Secondary | ICD-10-CM

## 2011-04-27 DIAGNOSIS — E785 Hyperlipidemia, unspecified: Secondary | ICD-10-CM

## 2011-04-27 MED ORDER — RITONAVIR 100 MG PO TABS: 100.0000 mg | ORAL_TABLET | Freq: Every day | ORAL | Status: DC

## 2011-04-27 MED ORDER — LISINOPRIL 40 MG PO TABS: 40.0000 mg | ORAL_TABLET | Freq: Every day | ORAL | Status: DC

## 2011-04-27 MED ORDER — PRAVASTATIN SODIUM 40 MG PO TABS: 40.0000 mg | ORAL_TABLET | Freq: Every day | ORAL | Status: DC

## 2011-04-27 MED ORDER — AMLODIPINE BESYLATE 10 MG PO TABS: 10.0000 mg | ORAL_TABLET | Freq: Every day | ORAL | Status: DC

## 2011-04-27 MED ORDER — EMTRICITABINE-TENOFOVIR DF 200-300 MG PO TABS: 1.0000 | ORAL_TABLET | Freq: Every day | ORAL | Status: DC

## 2011-04-27 MED ORDER — DARUNAVIR ETHANOLATE 400 MG PO TABS: 800.0000 mg | ORAL_TABLET | Freq: Every day | ORAL | Status: DC

## 2011-04-27 NOTE — Assessment & Plan Note (Signed)
Restart prezista Norvir Truvada, bring back to clinic for 1 months time to recheck viral load and CD4 count.

## 2011-04-27 NOTE — Progress Notes (Signed)
Subjective:    Patient ID: Peter Cox, male    DOB: Dec 02, 1960, 50 y.o.   MRN: [id number]  HPI  Peter Cox is a 50 year old African [place] male with HIV previously well controlled though not adherent to antiretroviral therapy for more than 2 years time. He was previous he followed by Dr. Philbert Riser in internal medicine clinic. In addition to not being adherent with his antiretroviral regimen which was prezista, Norvir Truvada he's been noncompliant with his antihypertensive medications as well. His blood pressure today in clinic is in the 190s over 1 teens. He is accompanied today by family members been trying to help him get back in the clinic and get back onto his medications. Patient denies any blurry vision or chest pain or other symptoms related to his headaches. He states he has had trouble with nausea and general intolerance of his antiretroviral regimen in the past. I explained my reasons for one to him back on a protease inhibitor. I spent greater than 60 minutes with the patient including greater than 50% of time in face to face counsel of the patient and in coordination of their care.   Review of Systems  Constitutional: Negative for fever, chills, diaphoresis, activity change, appetite change, fatigue and unexpected weight change.  HENT: Negative for congestion, sore throat, rhinorrhea, sneezing, trouble swallowing and sinus pressure.   Eyes: Negative for photophobia and visual disturbance.  Respiratory: Negative for cough, chest tightness, shortness of breath, wheezing and stridor.   Cardiovascular: Negative for chest pain, palpitations and leg swelling.  Gastrointestinal: Negative for nausea, vomiting, abdominal pain, diarrhea, constipation, blood in stool, abdominal distention and anal bleeding.  Genitourinary: Negative for dysuria, hematuria, flank pain and difficulty urinating.  Musculoskeletal: Negative for myalgias, back pain, joint swelling, arthralgias and gait problem.    Skin: Negative for color change, pallor, rash and wound.  Neurological: Negative for dizziness, tremors, weakness and light-headedness.  Hematological: Negative for adenopathy. Does not bruise/bleed easily.  Psychiatric/Behavioral: Negative for behavioral problems, confusion, sleep disturbance, dysphoric mood, decreased concentration and agitation.       Objective:   Physical Exam  Constitutional: He is oriented to person, place, and time. He appears well-developed and well-nourished. No distress.  HENT:  Head: Normocephalic and atraumatic.  Mouth/Throat: Oropharynx is clear and moist. No oropharyngeal exudate.  Eyes: Conjunctivae and EOM are normal. Pupils are equal, round, and reactive to light. No scleral icterus.  Neck: Normal range of motion. Neck supple. No JVD present.  Cardiovascular: Normal rate, regular rhythm and normal heart sounds.  Exam reveals no gallop and no friction rub.   No murmur heard. Pulmonary/Chest: Effort normal and breath sounds normal. No respiratory distress. He has no wheezes. He has no rales. He exhibits no tenderness.  Abdominal: He exhibits no distension and no mass. There is no tenderness. There is no rebound and no guarding.  Musculoskeletal: He exhibits no edema and no tenderness.  Lymphadenopathy:    He has no cervical adenopathy.  Neurological: He is alert and oriented to person, place, and time. He has normal reflexes. He exhibits normal muscle tone. Coordination normal.  Skin: Skin is warm and dry. He is not diaphoretic. No erythema. No pallor.  Psychiatric: He has a normal mood and affect. His behavior is normal. Judgment and thought content normal.          Assessment & Plan:  HIV INFECTION Restart prezista Norvir Truvada, bring back to clinic for 1 months time to recheck viral load and CD4 count.  TOBACCO ABUSE Will tackle this problem once his HIV is better controlled and his blood pressures more optimally managed  UNSPECIFIED  DISORDER TEETH&SUPPORTING STRUCTURES Needs to be seen by dentist  HYPERTENSION Very poorly controlled emphasized his need to take antihypertensive medications to prevent him getting a stroke or other complication is uncontrolled hypertension.

## 2011-04-27 NOTE — Patient Instructions (Signed)
PLEASE MAKE SURE THAT YOU TAKE THE FOLLOWING EVERY DAY NO MATTER WHAT PREZISTA 400MG  (ORANGE PILL) TAKE TWO TABLETS DAILY WITH NORVIR 100MG  TABLET (WHITE PILL) EVERY DAY WITH TRUVADA (BLUE PILL) EVERY DAY

## 2011-04-27 NOTE — Assessment & Plan Note (Signed)
Will tackle this problem once his HIV is better controlled and his blood pressures more optimally managed

## 2011-04-27 NOTE — Assessment & Plan Note (Signed)
Needs to be seen by dentist

## 2011-04-27 NOTE — Assessment & Plan Note (Signed)
Very poorly controlled emphasized his need to take antihypertensive medications to prevent him getting a stroke or other complication is uncontrolled hypertension.

## 2011-06-05 ENCOUNTER — Emergency Department (HOSPITAL_COMMUNITY): Payer: Medicare Other

## 2011-06-05 ENCOUNTER — Encounter (HOSPITAL_COMMUNITY): Payer: Self-pay | Admitting: *Deleted

## 2011-06-05 ENCOUNTER — Other Ambulatory Visit: Payer: Self-pay

## 2011-06-05 ENCOUNTER — Inpatient Hospital Stay (HOSPITAL_COMMUNITY)
Admission: EM | Admit: 2011-06-05 | Discharge: 2011-06-05 | DRG: 897 | Disposition: A | Discharge status: Home / Self Care | Payer: Medicare Other | Attending: Internal Medicine | Admitting: Internal Medicine

## 2011-06-05 DIAGNOSIS — N179 Acute kidney failure, unspecified: Secondary | ICD-10-CM | POA: Diagnosis present

## 2011-06-05 DIAGNOSIS — N182 Chronic kidney disease, stage 2 (mild): Secondary | ICD-10-CM | POA: Diagnosis present

## 2011-06-05 DIAGNOSIS — E785 Hyperlipidemia, unspecified: Secondary | ICD-10-CM

## 2011-06-05 DIAGNOSIS — F1010 Alcohol abuse, uncomplicated: Principal | ICD-10-CM | POA: Diagnosis present

## 2011-06-05 DIAGNOSIS — F329 Major depressive disorder, single episode, unspecified: Secondary | ICD-10-CM | POA: Diagnosis present

## 2011-06-05 DIAGNOSIS — R55 Syncope and collapse: Secondary | ICD-10-CM

## 2011-06-05 DIAGNOSIS — Z79899 Other long term (current) drug therapy: Secondary | ICD-10-CM

## 2011-06-05 DIAGNOSIS — F1910 Other psychoactive substance abuse, uncomplicated: Secondary | ICD-10-CM | POA: Diagnosis present

## 2011-06-05 DIAGNOSIS — R404 Transient alteration of awareness: Secondary | ICD-10-CM

## 2011-06-05 DIAGNOSIS — Z9119 Patient's noncompliance with other medical treatment and regimen: Secondary | ICD-10-CM

## 2011-06-05 DIAGNOSIS — I10 Essential (primary) hypertension: Secondary | ICD-10-CM

## 2011-06-05 DIAGNOSIS — N1832 Chronic kidney disease, stage 3b: Secondary | ICD-10-CM | POA: Diagnosis present

## 2011-06-05 DIAGNOSIS — N184 Chronic kidney disease, stage 4 (severe): Secondary | ICD-10-CM | POA: Diagnosis present

## 2011-06-05 DIAGNOSIS — I129 Hypertensive chronic kidney disease with stage 1 through stage 4 chronic kidney disease, or unspecified chronic kidney disease: Secondary | ICD-10-CM | POA: Diagnosis present

## 2011-06-05 DIAGNOSIS — B20 Human immunodeficiency virus [HIV] disease: Secondary | ICD-10-CM | POA: Diagnosis present

## 2011-06-05 DIAGNOSIS — Z91199 Patient's noncompliance with other medical treatment and regimen due to unspecified reason: Secondary | ICD-10-CM

## 2011-06-05 DIAGNOSIS — N183 Chronic kidney disease, stage 3 unspecified: Secondary | ICD-10-CM | POA: Diagnosis present

## 2011-06-05 DIAGNOSIS — N189 Chronic kidney disease, unspecified: Secondary | ICD-10-CM | POA: Diagnosis present

## 2011-06-05 DIAGNOSIS — F3289 Other specified depressive episodes: Secondary | ICD-10-CM | POA: Diagnosis present

## 2011-06-05 DIAGNOSIS — F17200 Nicotine dependence, unspecified, uncomplicated: Secondary | ICD-10-CM | POA: Diagnosis present

## 2011-06-05 DIAGNOSIS — R4020 Unspecified coma: Secondary | ICD-10-CM | POA: Diagnosis present

## 2011-06-05 DIAGNOSIS — Z21 Asymptomatic human immunodeficiency virus [HIV] infection status: Secondary | ICD-10-CM | POA: Diagnosis present

## 2011-06-05 LAB — BASIC METABOLIC PANEL
BUN: 18 mg/dL (ref 6–23)
CO2: 27 mEq/L (ref 19–32)
Calcium: 8.9 mg/dL (ref 8.4–10.5)
Chloride: 109 mEq/L (ref 96–112)
Creatinine, Ser: 1.45 mg/dL — ABNORMAL HIGH (ref 0.50–1.35)
GFR calc Af Amer: 63 mL/min — ABNORMAL LOW (ref >90)
GFR calc non Af Amer: 54 mL/min — ABNORMAL LOW (ref >90)
Glucose, Bld: 87 mg/dL (ref 70–99)
Potassium: 3.8 mEq/L (ref 3.5–5.1)
Sodium: 142 mEq/L (ref 135–145)

## 2011-06-05 LAB — CBC
HCT: 34.7 % — ABNORMAL LOW (ref 39.0–52.0)
Hemoglobin: 11.8 g/dL — ABNORMAL LOW (ref 13.0–17.0)
MCH: 29.6 pg (ref 26.0–34.0)
MCHC: 34 g/dL (ref 30.0–36.0)
MCV: 87.2 fL (ref 78.0–100.0)
Platelets: 250 10*3/uL (ref 150–400)
RBC: 3.98 MIL/uL — ABNORMAL LOW (ref 4.22–5.81)
RDW: 13.9 % (ref 11.5–15.5)
WBC: 8.6 10*3/uL (ref 4.0–10.5)

## 2011-06-05 LAB — URINALYSIS, ROUTINE W REFLEX MICROSCOPIC
Bilirubin Urine: NEGATIVE
Glucose, UA: NEGATIVE mg/dL
Hgb urine dipstick: NEGATIVE
Ketones, ur: NEGATIVE mg/dL
Leukocytes, UA: NEGATIVE
Nitrite: NEGATIVE
Protein, ur: 30 mg/dL — AB
Specific Gravity, Urine: 1.014 (ref 1.005–1.030)
Urobilinogen, UA: 0.2 mg/dL (ref 0.0–1.0)
pH: 5.5 (ref 5.0–8.0)

## 2011-06-05 LAB — COMPREHENSIVE METABOLIC PANEL
ALT: 11 U/L (ref 0–53)
AST: 14 U/L (ref 0–37)
Albumin: 3.3 g/dL — ABNORMAL LOW (ref 3.5–5.2)
Alkaline Phosphatase: 112 U/L (ref 39–117)
BUN: 24 mg/dL — ABNORMAL HIGH (ref 6–23)
CO2: 25 mEq/L (ref 19–32)
Calcium: 8.8 mg/dL (ref 8.4–10.5)
Chloride: 104 mEq/L (ref 96–112)
Creatinine, Ser: 1.89 mg/dL — ABNORMAL HIGH (ref 0.50–1.35)
GFR calc Af Amer: 45 mL/min — ABNORMAL LOW (ref >90)
GFR calc non Af Amer: 39 mL/min — ABNORMAL LOW (ref >90)
Glucose, Bld: 134 mg/dL — ABNORMAL HIGH (ref 70–99)
Potassium: 3.5 mEq/L (ref 3.5–5.1)
Sodium: 140 mEq/L (ref 135–145)
Total Bilirubin: 0.1 mg/dL — ABNORMAL LOW (ref 0.3–1.2)
Total Protein: 7.8 g/dL (ref 6.0–8.3)

## 2011-06-05 LAB — DIFFERENTIAL
Basophils Absolute: 0.1 10*3/uL (ref 0.0–0.1)
Basophils Relative: 1 % (ref 0–1)
Eosinophils Absolute: 0.3 10*3/uL (ref 0.0–0.7)
Eosinophils Relative: 3 % (ref 0–5)
Lymphocytes Relative: 24 % (ref 12–46)
Lymphs Abs: 2.1 10*3/uL (ref 0.7–4.0)
Monocytes Absolute: 0.3 10*3/uL (ref 0.1–1.0)
Monocytes Relative: 4 % (ref 3–12)
Neutro Abs: 5.8 10*3/uL (ref 1.7–7.7)
Neutrophils Relative %: 68 % (ref 43–77)

## 2011-06-05 LAB — RAPID URINE DRUG SCREEN, HOSP PERFORMED
Amphetamines: NOT DETECTED
Barbiturates: NOT DETECTED
Benzodiazepines: NOT DETECTED
Cocaine: NOT DETECTED
Opiates: NOT DETECTED
Tetrahydrocannabinol: POSITIVE — AB

## 2011-06-05 LAB — URINE MICROSCOPIC-ADD ON

## 2011-06-05 LAB — POCT I-STAT TROPONIN I: Troponin i, poc: 0 ng/mL (ref 0.00–0.08)

## 2011-06-05 LAB — SODIUM, URINE, RANDOM: Sodium, Ur: 95 mEq/L

## 2011-06-05 LAB — CREATININE, URINE, RANDOM: Creatinine, Urine: 120.89 mg/dL

## 2011-06-05 LAB — ETHANOL: Alcohol, Ethyl (B): 118 mg/dL — ABNORMAL HIGH (ref 0–11)

## 2011-06-05 MED ORDER — ASPIRIN 81 MG PO CHEW
81.0000 mg | CHEWABLE_TABLET | Freq: Every day | ORAL | Status: DC
  Administered 2011-06-05: 81 mg via ORAL

## 2011-06-05 MED ORDER — AMLODIPINE BESYLATE 10 MG PO TABS
10.0000 mg | ORAL_TABLET | Freq: Every day | ORAL | Status: DC
  Administered 2011-06-05: 10 mg via ORAL
  Filled 2011-06-05: qty 1

## 2011-06-05 MED ORDER — ACETAMINOPHEN 325 MG PO TABS: 650.0000 mg | ORAL_TABLET | Freq: Three times a day (TID) | ORAL | Status: DC | PRN

## 2011-06-05 MED ORDER — RITONAVIR 100 MG PO TABS
100.0000 mg | ORAL_TABLET | Freq: Every day | ORAL | Status: DC
  Administered 2011-06-05: 100 mg via ORAL
  Filled 2011-06-05: qty 1

## 2011-06-05 MED ORDER — EMTRICITABINE-TENOFOVIR DF 200-300 MG PO TABS
1.0000 | ORAL_TABLET | Freq: Every day | ORAL | Status: DC
  Administered 2011-06-05: 1 via ORAL
  Filled 2011-06-05: qty 1

## 2011-06-05 MED ORDER — ENOXAPARIN SODIUM 40 MG/0.4ML ~~LOC~~ SOLN
40.0000 mg | Freq: Every day | SUBCUTANEOUS | Status: DC
  Filled 2011-06-05: qty 0.4

## 2011-06-05 MED ORDER — VITAMIN B-1 100 MG PO TABS
100.0000 mg | ORAL_TABLET | Freq: Every day | ORAL | Status: DC
  Administered 2011-06-05: 100 mg via ORAL
  Filled 2011-06-05: qty 1

## 2011-06-05 MED ORDER — SODIUM CHLORIDE 0.9 % IV SOLN
INTRAVENOUS | Status: AC
  Administered 2011-06-05: 07:00:00 via INTRAVENOUS

## 2011-06-05 MED ORDER — DARUNAVIR ETHANOLATE 400 MG PO TABS
800.0000 mg | ORAL_TABLET | Freq: Every day | ORAL | Status: DC
  Administered 2011-06-05: 800 mg via ORAL
  Filled 2011-06-05: qty 2

## 2011-06-05 MED ORDER — SIMVASTATIN 20 MG PO TABS: 20.0000 mg | ORAL_TABLET | Freq: Every day | ORAL | Status: DC

## 2011-06-05 MED ORDER — PRAVASTATIN SODIUM 40 MG PO TABS
40.0000 mg | ORAL_TABLET | Freq: Every day | ORAL | Status: DC
  Filled 2011-06-05: qty 1

## 2011-06-05 MED ORDER — FOLIC ACID 1 MG PO TABS
1.0000 mg | ORAL_TABLET | Freq: Every day | ORAL | Status: DC
  Administered 2011-06-05: 1 mg via ORAL
  Filled 2011-06-05: qty 1

## 2011-06-05 NOTE — Progress Notes (Signed)
Pt. Discharged 06/05/2011  3:30 PM Discharge instructions reviewed with patient/family. Patient/family verbalized understanding. All Rx's given. Questions answered as needed. Pt. Discharged to home with family/self. Taken off unit via W/C. Peter Cox

## 2011-06-05 NOTE — Progress Notes (Signed)
Subjective: Patient feels well. No c/o or acute distress noted. Patient ambulates in room and hallway without any problems. No acute events overnight per nursing.  Objective: Vital signs in last 24 hours: Filed Vitals:   06/05/11 0052 06/05/11 0245 06/05/11 0309 06/05/11 0417  BP:  133/78  138/79  Pulse:  78  76  Temp:   98.3 F (36.8 C) 97.7 F (36.5 C)  TempSrc:   Rectal Oral  Resp:    15  Height:    5\' 11"  (1.803 m)  Weight:    125 lb (56.7 kg)  SpO2: 98% 99%  97%   Weight change:   Intake/Output Summary (Last 24 hours) at 06/05/11 1242 Last data filed at 06/05/11 0500  Gross per 24 hour  Intake      0 ml  Output    250 ml  Net   -250 ml   Physical Exam: General: alert, well-developed, and cooperative to examination.  Head: normocephalic and atraumatic.  Eyes: vision grossly intact, pupils equal, pupils round, pupils reactive to light, no injection and anicteric.  Mouth: pharynx pink and moist, no erythema, and no exudates.  Neck: supple, full ROM, no thyromegaly, no JVD, and no carotid bruits.  Lungs: normal respiratory effort, no accessory muscle use, normal breath sounds, no crackles, and no wheezes. Heart: normal rate, regular rhythm, no murmur, no gallop, and no rub.  Abdomen: soft, non-tender, normal bowel sounds, no distention, no guarding, no rebound tenderness, no hepatomegaly, and no splenomegaly.  Msk: no joint swelling, no joint warmth, and no redness over joints.  Pulses: 2+ DP/PT pulses bilaterally Extremities: No cyanosis, clubbing, edema Neurologic: alert & oriented X3, cranial nerves II-XII intact, strength normal in all extremities, sensation intact to light touch, and gait normal.  Skin: turgor normal and no rashes.  Psych: Oriented X3, memory intact for recent and remote, normally interactive, good eye contact, not anxious appearing, and not depressed appearing.   Lab Results: Basic Metabolic Panel:  Lab [id number]  Esias Mory 140  K 3.5  CL 104    CO2 25  GLUCOSE 134*  BUN 24*  CREATININE 1.89*  CALCIUM 8.8  MG --  PHOS --   Liver Function Tests:  Lab 06/05/11 0109  AST 14  ALT 11  ALKPHOS 112  BILITOT 0.1*  PROT 7.8  ALBUMIN 3.3*  CBC:  Lab 06/05/11 0109  WBC 8.6  NEUTROABS 5.8  HGB 11.8*  HCT 34.7*  MCV 87.2  PLT 250   Urine Drug Screen: Drugs of Abuse     Component Value Date/Time   LABOPIA NONE DETECTED 06/05/2011 1121   COCAINSCRNUR NONE DETECTED 06/05/2011 1121   COCAINSCRNUR NEG 06/24/2008 2035   LABBENZ NONE DETECTED 06/05/2011 1121   LABBENZ NEG 06/24/2008 2035   AMPHETMU NONE DETECTED 06/05/2011 1121   AMPHETMU NEG 06/24/2008 2035   THCU POSITIVE* 06/05/2011 1121   LABBARB NONE DETECTED 06/05/2011 1121    Alcohol Level:  Lab 06/05/11 0109  ETH 118*    Micro Results: No results found for this or any previous visit (from the past 240 hour(s)). Studies/Results: Ct Head Wo Contrast  06/05/2011  *RADIOLOGY REPORT*  Clinical Data: Altered mental status  CT HEAD WITHOUT CONTRAST  Technique:  Contiguous axial images were obtained from the base of the skull through the vertex without contrast.  Comparison: 02/12/2008  Findings: Periventricular and subcortical white matter hypodensities are most in keeping with chronic microangiopathic change.  Multiple remote appearing basal ganglia and thalamic lacunar infarctions  on the right. There is no evidence for acute hemorrhage, hydrocephalus, mass lesion, or abnormal extra-axial fluid collection.  No definite CT evidence for acute infarction. The visualized paranasal sinuses and mastoid air cells are predominately clear.  IMPRESSION: Moderate to advanced white matter hypodensities. This is a nonspecific finding often seen with chronic microangiopathic change.  Basal ganglia lacunar infarctions.  No definite acute intracranial abnormality.  Original Report Authenticated By: Suanne Marker, M.D.   Dg Chest Portable 1 View  06/05/2011  *RADIOLOGY REPORT*   Clinical Data: Altered mental status  PORTABLE CHEST - 1 VIEW  Comparison: 10/07/2008  Findings: Mild vascular congestion.  Mild bibasilar airspace disease may be atelectasis or infiltrate.  No significant effusion. Heart size is mildly enlarged. Right apical bleb.  IMPRESSION: Pulmonary vascular congestion suggesting mild fluid overload.  Mild bibasilar atelectasis or infiltrate.  Original Report Authenticated By: Truett Perna, M.D.   Medications: I have reviewed the patient's current medications. Scheduled Meds:    . amLODipine  10 mg Oral Daily  . aspirin  81 mg Oral Daily  . darunavir  800 mg Oral Daily  . emtricitabine-tenofovir  1 tablet Oral Daily  . enoxaparin  40 mg Subcutaneous Daily  . folic acid  1 mg Oral Daily  . pravastatin  40 mg Oral q1800  . ritonavir  100 mg Oral Daily  . thiamine  100 mg Oral Daily  . DISCONTD: simvastatin  20 mg Oral q1800   Continuous Infusions:    . sodium chloride 100 mL/hr at 06/05/11 0704   PRN Meds:.acetaminophen Assessment/Plan:  1) Loss of Consciousness: Patient presents with LOC at party while he was drinking alcohol. His alcohol level was 118 upon arrival to ED. He feels well this am. No c/o or acute distress noted. No seizure activity or dizziness noted.  No focal neuro deficit.  Head CT indicates no definite acute intracranial abnormality. The etiology of LOC is likely alcohol intoxication, which is completely resolved. Will discharge home today.  2) HIV: last CD4 count 470. Last seen by Dr. Drucilla Schmidt in [facility] on 04/27/2011 after a long lapse in follow up- about a year. Recently started back on his HAART meds. Has been non-compliant to all his meds in past.  3) Hypertension: stable Plan: Continue Norvasc. - Hold Lisinopril due to concern of AKI and possible dehydration.      4) Depression: stable, will resume home dose.  5) AKI - very mild. likely 2/2 dehydration and ETOH intoxication  Patient received IVF overnight, will  recheck BMP.    LOS: 0 days   Peng Thorstenson 06/05/2011, 12:42 PM

## 2011-06-05 NOTE — Discharge Summary (Signed)
[facility] Discharge Note  Name: Peter Cox MRN: [id number] DOB: Dec 28, 1960 50 y.o.  Date of Admission: 06/05/2011 12:34 AM Date of Discharge: 06/05/2011 Attending Physician: Milta Deiters  Discharge Diagnosis: 1. Alcohol intoxification 2. HIV INFECTION 3. Hypertension 4. Acute kidney insufficiency  5.  Chronic renal insuficiency 6. SUBSTANCE ABUSE, MULTIPLE 7. History of depression    Discharge Medications: Current Discharge Medication List    CONTINUE these medications which have NOT CHANGED   Details  acetaminophen (TYLENOL) 650 MG CR tablet Take 650 mg by mouth every 8 (eight) hours as needed. Headache pain or fever    amLODipine (NORVASC) 10 MG tablet Take 1 tablet (10 mg total) by mouth daily. Qty: 30 tablet, Refills: 6   Associated Diagnoses: HBP (high blood pressure)    aspirin 81 MG tablet Take 81 mg by mouth daily.      citalopram (CELEXA) 10 MG tablet Take 10 mg by mouth daily.      darunavir (PREZISTA) 400 MG tablet Take 2 tablets (800 mg total) by mouth daily. Qty: 60 tablet, Refills: 11   Associated Diagnoses: Human immunodeficiency virus (HIV) disease    emtricitabine-tenofovir (TRUVADA) 200-300 MG per tablet Take 1 tablet by mouth daily. Qty: 30 tablet, Refills: 11   Associated Diagnoses: Human immunodeficiency virus (HIV) disease    lisinopril (PRINIVIL,ZESTRIL) 40 MG tablet Take 1 tablet (40 mg total) by mouth daily. Qty: 30 tablet, Refills: 6   Associated Diagnoses: HBP (high blood pressure)    pravastatin (PRAVACHOL) 40 MG tablet Take 1 tablet (40 mg total) by mouth at bedtime. Qty: 30 tablet, Refills: 6   Associated Diagnoses: Hyperlipidemia    ritonavir (NORVIR) 100 MG TABS Take 1 tablet (100 mg total) by mouth daily. Qty: 30 tablet, Refills: 11   Associated Diagnoses: Human immunodeficiency virus (HIV) disease        Disposition and follow-up:   Peter Cox was discharged from [facility] in Stable condition.    Follow-up Appointments: Follow-up Information    Follow up with HO,MICHELE in 2 weeks.    Please follow up on BMP and renal function. Patient is on lisinopril.    Follow up with Alcide Evener, MD. (as scheduled.)    Contact information:   [address] [place] [phone]    Please continue to follow up with HIV management.       Consultations:  None  Procedures Performed:  Ct Head Wo Contrast  06/05/2011  *RADIOLOGY REPORT*  Clinical Data: Altered mental status  CT HEAD WITHOUT CONTRAST  Technique:  Contiguous axial images were obtained from the base of the skull through the vertex without contrast.  Comparison: 02/12/2008  Findings: Periventricular and subcortical white matter hypodensities are most in keeping with chronic microangiopathic change.  Multiple remote appearing basal ganglia and thalamic lacunar infarctions on the right. There is no evidence for acute hemorrhage, hydrocephalus, mass lesion, or abnormal extra-axial fluid collection.  No definite CT evidence for acute infarction. The visualized paranasal sinuses and mastoid air cells are predominately clear.  IMPRESSION: Moderate to advanced white matter hypodensities. This is a nonspecific finding often seen with chronic microangiopathic change.  Basal ganglia lacunar infarctions.  No definite acute intracranial abnormality.  Original Report Authenticated By: Suanne Marker, M.D.   Dg Chest Portable 1 View  06/05/2011  *RADIOLOGY REPORT*  Clinical Data: Altered mental status  PORTABLE CHEST - 1 VIEW  Comparison:  10/07/2008  Findings: Mild vascular congestion.  Mild bibasilar airspace disease may be atelectasis or infiltrate.  No significant effusion. Heart size is mildly enlarged. Right apical bleb.  IMPRESSION: Pulmonary vascular congestion suggesting mild fluid overload.  Mild bibasilar atelectasis or infiltrate.   Original Report Authenticated By: Truett Perna, M.D.    Admission HPI:   Peter Cox is a 50 y.o.male with past medical history significant for HIV (last viral load 50400 in 03/2011 and CD4 count 470) who presents with LOC for less than 30 minutes on the night of admission.  Peter Cox states that he cannot remember anything that happened tonight. The last thing he remembers is dancing. Per his girlfriend who did not witness the event, he had been dancing then sat down. After sitting down he became unresponsive, he was helped to the floor and EMS was called. Per the ED attending, he was awake within minutes when EMS arrived. He did not soil himself and there were no reports of tonic clonic movements. He has no prior history of unexplained LOC or seizures. The patient endorses drinking the night of admission and denies other substance use. Per his girlfriend he drinks most days between 1-6 drinks per day. He also smokes marijuana. His girlfriend states he sometimes acts oddly with slurred speech that is sometimes incoherent. She states he will sometimes laugh inappropriately.   Hospital Course by problem list:  1. Alcohol intoxification     Presents with her loss of consciousness at a party while he was drinking alcohol. No seizure activities noted at the time. His alcohol level was 118 upon arrival to the ED. His UDS indicates positive marijuana. The etiology of his loss of consciousness is likely alcohol intoxication. Patient was treated with IV fluid replacement and Ativan PRN. No dizziness, LOC or seizure activities noted since admission. Patient is stable upon discharge. He is alert and oriented x3 with no complaint of discomfort. Patient is able to ambulate in his room and hallway without any problem. Patient will be discharged home and followup with his PCP in 2 weeks.  2. HIV INFECTION    Patient has been noncompliant to all of his HIV medications in the past. He was last seen by Dr.  Drucilla Schmidt in [facility] on 04/27/11 after a long lapse in follow up- about a year. Patient is stable at the time of discharge. He will continue to followup with Dr. Drucilla Schmidt for HIV management as an outpatient.  3. Hypertension, stable. Resume home regimen.    4. Acute on chronic kidney insufficiency      Patient has had a mild chronic kidney insufficiency with creatinine level between 1.2 -1.9.  Patient's creatinine level was 1.89 on admission. The etiology is likely to be dehydration and EtOH intoxication. Patient has received IV fluid replacement overnight and his Cr. 1.45. He is stable at time of discharge and will continue to followup with his PCP.  6. SUBSTANCE ABUSE, MULTIPLE    Patient has had a long history of polysubstance abuse including tobacco, alcohol, cocaine and marijuana.    Patient is educated on stopping all substance abuse and he will continue to followup with his PCP  7. History of depression, stable. Resume home meds.   Discharge Vitals:  BP 138/79  Pulse 76  Temp(Src) 97.7 F (36.5 C) (Oral)  Resp 15  Ht 5\' 11"  (1.803 m)  Wt 125 lb (56.7 kg)  BMI 17.43 kg/m2  SpO2 97%  Discharge Labs:  Results for  orders placed during the hospital encounter of 06/05/11 (from the past 24 hour(s))  CBC     Status: Abnormal   Collection Time   06/05/11  1:09 AM      Component Value Range   WBC 8.6  4.0 - 10.5 (K/uL)   RBC 3.98 (*) 4.22 - 5.81 (MIL/uL)   Hemoglobin 11.8 (*) 13.0 - 17.0 (g/dL)   HCT 34.7 (*) 39.0 - 52.0 (%)   MCV 87.2  78.0 - 100.0 (fL)   MCH 29.6  26.0 - 34.0 (pg)   MCHC 34.0  30.0 - 36.0 (g/dL)   RDW 13.9  11.5 - 15.5 (%)   Platelets 250  150 - 400 (K/uL)  DIFFERENTIAL     Status: Normal   Collection Time   06/05/11  1:09 AM      Component Value Range   Neutrophils Relative 68  43 - 77 (%)   Lymphocytes Relative 24  12 - 46 (%)   Monocytes Relative 4  3 - 12 (%)   Eosinophils Relative 3  0 - 5 (%)   Basophils Relative 1  0 - 1 (%)   Neutro Abs 5.8  1.7 -  7.7 (K/uL)   Lymphs Abs 2.1  0.7 - 4.0 (K/uL)   Monocytes Absolute 0.3  0.1 - 1.0 (K/uL)   Eosinophils Absolute 0.3  0.0 - 0.7 (K/uL)   Basophils Absolute 0.1  0.0 - 0.1 (K/uL)   WBC Morphology MILD LEFT SHIFT (1-5% METAS, OCC MYELO, OCC BANDS)    COMPREHENSIVE METABOLIC PANEL     Status: Abnormal   Collection Time   06/05/11  1:09 AM      Component Value Range   Sodium 140  135 - 145 (mEq/L)   Potassium 3.5  3.5 - 5.1 (mEq/L)   Chloride 104  96 - 112 (mEq/L)   CO2 25  19 - 32 (mEq/L)   Glucose, Bld 134 (*) 70 - 99 (mg/dL)   BUN 24 (*) 6 - 23 (mg/dL)   Creatinine, Ser 1.89 (*) 0.50 - 1.35 (mg/dL)   Calcium 8.8  8.4 - 10.5 (mg/dL)   Total Protein 7.8  6.0 - 8.3 (g/dL)   Albumin 3.3 (*) 3.5 - 5.2 (g/dL)   AST 14  0 - 37 (U/L)   ALT 11  0 - 53 (U/L)   Alkaline Phosphatase 112  39 - 117 (U/L)   Total Bilirubin 0.1 (*) 0.3 - 1.2 (mg/dL)   GFR calc non Af Amer 39 (*) >90 (mL/min)   GFR calc Af Amer 45 (*) >90 (mL/min)  ETHANOL     Status: Abnormal   Collection Time   06/05/11  1:09 AM      Component Value Range   Alcohol, Ethyl (B) 118 (*) 0 - 11 (mg/dL)  POCT I-STAT TROPONIN I     Status: Normal   Collection Time   06/05/11  1:25 AM      Component Value Range   Troponin i, poc 0.00  0.00 - 0.08 (ng/mL)   Comment 3           URINALYSIS, ROUTINE W REFLEX MICROSCOPIC     Status: Abnormal   Collection Time   06/05/11 11:21 AM      Component Value Range   Color, Urine YELLOW  YELLOW    APPearance CLEAR  CLEAR    Specific Gravity, Urine 1.014  1.005 - 1.030    pH 5.5  5.0 - 8.0    Glucose, UA  NEGATIVE  NEGATIVE (mg/dL)   Hgb urine dipstick NEGATIVE  NEGATIVE    Bilirubin Urine NEGATIVE  NEGATIVE    Ketones, ur NEGATIVE  NEGATIVE (mg/dL)   Protein, ur 30 (*) NEGATIVE (mg/dL)   Urobilinogen, UA 0.2  0.0 - 1.0 (mg/dL)   Nitrite NEGATIVE  NEGATIVE    Leukocytes, UA NEGATIVE  NEGATIVE   URINE RAPID DRUG SCREEN (HOSP PERFORMED)     Status: Abnormal   Collection Time   06/05/11  11:21 AM      Component Value Range   Opiates NONE DETECTED  NONE DETECTED    Cocaine NONE DETECTED  NONE DETECTED    Benzodiazepines NONE DETECTED  NONE DETECTED    Amphetamines NONE DETECTED  NONE DETECTED    Tetrahydrocannabinol POSITIVE (*) NONE DETECTED    Barbiturates NONE DETECTED  NONE DETECTED   SODIUM, URINE, RANDOM     Status: Normal   Collection Time   06/05/11 11:21 AM      Component Value Range   Sodium, Ur 95    CREATININE, URINE, RANDOM     Status: Normal   Collection Time   06/05/11 11:21 AM      Component Value Range   Creatinine, Urine 120.89    URINE MICROSCOPIC-ADD ON     Status: Normal   Collection Time   06/05/11 11:21 AM      Component Value Range   Squamous Epithelial / LPF RARE  RARE     Signed: Tilly Pernice 06/05/2011, 1:55 PM

## 2011-06-05 NOTE — ED Notes (Signed)
Assumed care of pt.  Pt reports smoking one joint of marijuana tonight and drinking 0.5pint of liquor. pts pupils equal 3 and sluggish.  Skin warm, dry and intact.  Neuro intact.  Pt A/O x 4, pt denies pain.

## 2011-06-05 NOTE — ED Provider Notes (Signed)
History     CSN: [id number] Arrival date & time: 06/05/2011 12:34 AM   First MD Initiated Contact with Patient 06/05/11 0055      Chief Complaint  Patient presents with  . Code STEMI    (Consider location/radiation/quality/duration/timing/severity/associated sxs/prior treatment) Patient is a 50 y.o. male presenting with altered mental status. The history is provided by the EMS personnel.  Altered Mental Status This is a new problem. The current episode started less than 1 hour ago. The problem occurs constantly. The problem has been gradually improving. Pertinent negatives include no abdominal pain.   level V caveat lies patient unable to provide any history. Per EMS, called out for CPR to the event Center where patient was participating in an alcohol-related event. He had a witnessed syncopal event where he was reportedly helped to the ground. Bystanders noted he did have pulses. On EMS arrival, patient had pulses was making minimal conversation and had no evidence of injury or trauma. An EKG was obtained and concern for ST elevation, code stemi was called. Patient was given aspirin. Arrival to the emergency department- patient awake shakes his head yes or no to questions appropriately but is nonverbal. He denies any chest pain, shortness of breath, nausea, abdominal pain. No diaphoresis noted by EMS. No report of seizure activity or shaking.   Past Medical History  Diagnosis Date  . Hypertension   . Cocaine abuse      clean since about 2000  . Marijuana abuse   . Alcohol abuse     heavy, clean since 2005  . Tobacco abuse   . Mild cognitive impairment     likely vascular dementia/psa/?HIV componentCT Head 02/12/08 for htn crisis and concern for stroke: impression-1. Old right internal capsule and thalamic lacunar infarcts. 2. Mild to moderate chronic small vessell white matter ischemic changes in both cerebral hemisphere, greater on the right  . HIV infection     dx 08/08/2008  (unknown how he was exposed)  Initial VL 43,200 and CD4 410 on 08/28/08  . HIV (human immunodeficiency virus infection)     Past Surgical History  Procedure Date  . Hemorrhoid surgery   . Closed reduction patellar     right knee  . Shoulder arthroscopy 01/2008    w/extensive debridement (Dr Mardelle Matte)    Family History  Problem Relation Age of Onset  . Diabetes Mother   . Hypertension Mother   . Stroke Mother   . Diabetes Father   . Hypertension Sister   . Hypertension Sister     History  Substance Use Topics  . Smoking status: Current Everyday Smoker    Types: Cigarettes  . Smokeless tobacco: Not on file   Comment: 1 pack  . Alcohol Use: Yes      Review of Systems  Unable to perform ROS Gastrointestinal: Negative for abdominal pain.  Skin: Negative for rash.  Psychiatric/Behavioral: Positive for altered mental status.   level 5 caveat applies  Allergies  Review of patient's allergies indicates no known allergies.  Home Medications   Current Outpatient Rx  Name Route Sig Dispense Refill  . ACETAMINOPHEN ER 650 MG PO TBCR Oral Take 650 mg by mouth every 8 (eight) hours as needed.      Marland Kitchen AMLODIPINE BESYLATE 10 MG PO TABS Oral Take 1 tablet (10 mg total) by mouth daily. 30 tablet 6  . ASPIRIN 81 MG PO TABS Oral Take 81 mg by mouth daily.      Marland Kitchen CITALOPRAM HYDROBROMIDE 10  MG PO TABS Oral Take 10 mg by mouth daily.      Marland Kitchen DARUNAVIR ETHANOLATE 400 MG PO TABS Oral Take 2 tablets (800 mg total) by mouth daily. 60 tablet 11  . EMTRICITABINE-TENOFOVIR 200-300 MG PO TABS Oral Take 1 tablet by mouth daily. 30 tablet 11  . LISINOPRIL 40 MG PO TABS Oral Take 1 tablet (40 mg total) by mouth daily. 30 tablet 6  . PRAVASTATIN SODIUM 40 MG PO TABS Oral Take 1 tablet (40 mg total) by mouth at bedtime. 30 tablet 6  . RITONAVIR 100 MG PO TABS Oral Take 1 tablet (100 mg total) by mouth daily. 30 tablet 11    BP 115/81  Pulse 47  Resp 14  SpO2 68%  Physical Exam    Constitutional: He appears well-developed and well-nourished.  HENT:  Head: Normocephalic and atraumatic.  Eyes: Conjunctivae and EOM are normal. Pupils are equal, round, and reactive to light.  Neck: Trachea normal. Neck supple. No thyromegaly present.  Cardiovascular: Normal rate, regular rhythm, S1 normal, S2 normal and normal pulses.     No systolic murmur is present   No diastolic murmur is present  Pulses:      Radial pulses are 2+ on the right side, and 2+ on the left side.  Pulmonary/Chest: Effort normal and breath sounds normal. He has no wheezes. He has no rhonchi. He has no rales. He exhibits no tenderness.  Abdominal: Soft. Normal appearance and bowel sounds are normal. There is no tenderness. There is no CVA tenderness and negative Murphy's sign.  Musculoskeletal:       BLE:s Calves nontender, no cords or erythema, negative Homans sign  Neurological: He is alert. He has normal strength. No sensory deficit. GCS eye subscore is 4. GCS verbal subscore is 5. GCS motor subscore is 6.       Eyes open, nonverbal, moves all extremities x4 without deficits and follows commands  Skin: Skin is warm and dry. No rash noted. He is not diaphoretic.  Psychiatric: His speech is normal.       Cooperative and appropriate    ED Course  Procedures (including critical care time)  Results for orders placed during the hospital encounter of 06/05/11  CBC      Component Value Range   WBC 8.6  4.0 - 10.5 (K/uL)   RBC 3.98 (*) 4.22 - 5.81 (MIL/uL)   Hemoglobin 11.8 (*) 13.0 - 17.0 (g/dL)   HCT 34.7 (*) 39.0 - 52.0 (%)   MCV 87.2  78.0 - 100.0 (fL)   MCH 29.6  26.0 - 34.0 (pg)   MCHC 34.0  30.0 - 36.0 (g/dL)   RDW 13.9  11.5 - 15.5 (%)   Platelets 250  150 - 400 (K/uL)  DIFFERENTIAL      Component Value Range   Neutrophils Relative 68  43 - 77 (%)   Lymphocytes Relative 24  12 - 46 (%)   Monocytes Relative 4  3 - 12 (%)   Eosinophils Relative 3  0 - 5 (%)   Basophils Relative 1  0 - 1  (%)   Neutro Abs 5.8  1.7 - 7.7 (K/uL)   Lymphs Abs 2.1  0.7 - 4.0 (K/uL)   Monocytes Absolute 0.3  0.1 - 1.0 (K/uL)   Eosinophils Absolute 0.3  0.0 - 0.7 (K/uL)   Basophils Absolute 0.1  0.0 - 0.1 (K/uL)   WBC Morphology MILD LEFT SHIFT (1-5% METAS, OCC MYELO, OCC BANDS)  COMPREHENSIVE METABOLIC PANEL      Component Value Range   Sodium 140  135 - 145 (mEq/L)   Potassium 3.5  3.5 - 5.1 (mEq/L)   Chloride 104  96 - 112 (mEq/L)   CO2 25  19 - 32 (mEq/L)   Glucose, Bld 134 (*) 70 - 99 (mg/dL)   BUN 24 (*) 6 - 23 (mg/dL)   Creatinine, Ser 1.89 (*) 0.50 - 1.35 (mg/dL)   Calcium 8.8  8.4 - 10.5 (mg/dL)   Total Protein 7.8  6.0 - 8.3 (g/dL)   Albumin 3.3 (*) 3.5 - 5.2 (g/dL)   AST 14  0 - 37 (U/L)   ALT 11  0 - 53 (U/L)   Alkaline Phosphatase 112  39 - 117 (U/L)   Total Bilirubin 0.1 (*) 0.3 - 1.2 (mg/dL)   GFR calc non Af Amer 39 (*) >90 (mL/min)   GFR calc Af Amer 45 (*) >90 (mL/min)  ETHANOL      Component Value Range   Alcohol, Ethyl (B) 118 (*) 0 - 11 (mg/dL)  POCT I-STAT TROPONIN I      Component Value Range   Troponin i, poc 0.00  0.00 - 0.08 (ng/mL)   Comment 3            Dg Chest Portable 1 View  06/05/2011  *RADIOLOGY REPORT*  Clinical Data: Altered mental status  PORTABLE CHEST - 1 VIEW  Comparison: 10/07/2008  Findings: Mild vascular congestion.  Mild bibasilar airspace disease may be atelectasis or infiltrate.  No significant effusion. Heart size is mildly enlarged. Right apical bleb.  IMPRESSION: Pulmonary vascular congestion suggesting mild fluid overload.  Mild bibasilar atelectasis or infiltrate.  Original Report Authenticated By: Truett Perna, M.D.      Date: 06/05/2011  Rate: 86  Rhythm: normal sinus rhythm  QRS Axis: normal  Intervals: normal  ST/T Wave abnormalities: nonspecific ST changes  Conduction Disutrbances:LVH  Narrative Interpretation:   Old EKG Reviewed: no sig chnages    12:55 AM EKG reviewed bedside with Dr. Lia Foyer on call for  interventional cardiology, agrees no code STEMI and cancel Cath Lab.  2:41 AM labs and x-ray reviewed as above. Patient started to wake up and back to baseline.? Alcohol versus syncope versus possible seizure. CT brain ordered. Medicine consultation for admit. Case discussed as above with outpatient clinics resident who agrees to evaluation and admit.  MDM   Unresponsive with abnormal EKG, code STEMI called by EMS prior to hospital arrival for ST elevations in V2 V3 V4. On arrival to the ED, patient without any ACS-type complaints and 12-lead EKG reviewed as above is similar to prior. Code STEMI canceled as above.  After IV fluids and period of observation patient waking up states he feels normal without complaints. Subclinical seizure considered as etiology and CT brain ordered. Alcohol level or 118 and family bedside sig is not drinking that much tonight that they would've thought to cause him to pass out.         Teressa Lower, MD 06/05/11 [id number]

## 2011-06-05 NOTE — ED Notes (Signed)
Cardiology at bedside and code stemi cancelled.  Pt rcvd 4 81 mg asa and 2 SL nitro enroute.

## 2011-06-05 NOTE — H&P (Signed)
77 man with HIV recently restarted on ART after long absence.  CD4 = 470 and Viral Load = 50,000.  Admitted now for falling out at bar while drinking.  Blood EtOH on arrival here was 118 (unknown interval from ictus).  No seizure reported -- unclear whether he was reliably observed.  Has some lethargy and dysarthria on arrival.  CNS exam otherwise non-focal.  CT showed old small vessel disease and basal ganglia strokes that are old.  Other labs are unrevealing of any cause of syncope.  Creat = 1.9.  This should be investigated as outpatient: suggest old labs, FENa, and spot urine for Protein:creat ratio.  May be due to hypertensive nephrosclerosis or HIV-related FSGS.  Seizure based on old strokes is a possibility but hard to prove at this poinjt and would likely be very hard to treat.  He needs f/u with ID and gen med, and he mostly needs to achieve abstinence.

## 2011-06-05 NOTE — H&P (Signed)
Hospital Admission Note Date: 06/05/2011  Patient name: Peter Cox Medical record number: [id number] Date of birth: 09-Apr-1961 Age: 50 y.o. Gender: male PCP: [name], MD  Medical Service: IMTS  Attending physician: Dr. [name]    1st Contact:  [name]   Pager: [phone] 2nd Contact:  [name]   Pager: [phone] After 5 pm or weekends: 1st Contact:      Pager: [phone] 2nd Contact:      Pager: [phone]  Chief Complaint: LOC  History of Present Illness: Peter Cox is a 50 y.o.male with past medical history significant for HIV (last viral load 50400 in 03/2011 and CD4 count 470)  who presents with LOC for less than 30 minutes on the night of admission.    UNKNOWN MANGANELLI states that he cannot remember anything that happened tonight. The last thing he remembers is dancing.  Per his girlfriend who did not witness the event, he had been dancing then sat down. After sitting down he became unresponsive, he was helped to the floor and EMS was called. Per the ED attending, he was awake within minutes when EMS arrived. He did not soil himself and there were no reports of tonic clonic movements. He has no prior history of unexplained LOC or seizures. The patient endorses drinking the night of admission and denies other substance use. Per his girlfriend he drinks most days between 1-6 drinks per day. He also smokes marijuana.  His girlfriend states he sometimes acts oddly with slurred speech that is sometimes incoherent. She states he will sometimes laugh inappropriately.  Review of Systems: Constitutional: Denies fever, chills, diaphoresis,  HEENT: Denies sore throat, rhinorrhea, tinnitus Respiratory: Denies SOB, DOE, cough,  Cardiovascular: Denies chest pain, palpitations and leg swelling.  Gastrointestinal: Denies nausea, vomiting, abdominal pain, diarrhea, constipation, Genitourinary: Denies dysuria and flank pain Musculoskeletal: Denies myalgias, back pain, joint swelling, arthralgias and gait  problem.  Neurological: Denies dizziness, seizures, syncope, weakness, light-headedness, numbness and headaches.    Past Medical History  Diagnosis Date  . Hypertension   . Cocaine abuse      clean since about 2000  . Marijuana abuse   . Alcohol abuse     heavy, clean since 2005  . Tobacco abuse   . Mild cognitive impairment     likely vascular dementia/psa/?HIV componentCT Head 02/12/08 for htn crisis and concern for stroke: impression-1. Old right internal capsule and thalamic lacunar infarcts. 2. Mild to moderate chronic small vessell white matter ischemic changes in both cerebral hemisphere, greater on the right  . HIV infection     dx 08/08/2008 (unknown how he was exposed)  Initial VL 43,200 and CD4 410 on 08/28/08  . HIV (human immunodeficiency virus infection)    2D echo 2009  Overall left ventricular systolic function was vigorous. Left ventricular ejection fraction was estimated , range being 65 % to 75 %. There was no diagnostic evidence of left ventricular regional wall motion abnormalities. Left ventricular wall thickness was mildly to moderately increased. Left ventricular diastolic function parameters were normal. - There was mild mitral valvular regurgitation.    Past Surgical History  Procedure Date  . Hemorrhoid surgery   . Closed reduction patellar     right knee  . Shoulder arthroscopy 01/2008    w/extensive debridement (Dr Mardelle Matte)    Meds: No current facility-administered medications on file as of 06/05/2011.   Medications Prior to Admission  Medication Sig Dispense Refill  . acetaminophen (TYLENOL) 650 MG CR tablet Take 650 mg  by mouth every 8 (eight) hours as needed. Headache pain or fever      . amLODipine (NORVASC) 10 MG tablet Take 1 tablet (10 mg total) by mouth daily.  30 tablet  6  . aspirin 81 MG tablet Take 81 mg by mouth daily.        . citalopram (CELEXA) 10 MG tablet Take 10 mg by mouth daily.        . darunavir (PREZISTA) 400 MG  tablet Take 2 tablets (800 mg total) by mouth daily.  60 tablet  11  . emtricitabine-tenofovir (TRUVADA) 200-300 MG per tablet Take 1 tablet by mouth daily.  30 tablet  11  . lisinopril (PRINIVIL,ZESTRIL) 40 MG tablet Take 1 tablet (40 mg total) by mouth daily.  30 tablet  6  . pravastatin (PRAVACHOL) 40 MG tablet Take 1 tablet (40 mg total) by mouth at bedtime.  30 tablet  6  . ritonavir (NORVIR) 100 MG TABS Take 1 tablet (100 mg total) by mouth daily.  30 tablet  11    Allergies: Review of patient's allergies indicates no known allergies.  Family History  Problem Relation Age of Onset  . Diabetes Mother   . Hypertension Mother   . Stroke Mother   . Diabetes Father   . Hypertension Sister   . Hypertension Sister     History   Social History  . Marital Status: Single    Spouse Name: N/A    Number of Children: N/A  . Years of Education: N/A   Occupational History  . Not on file.   Social History Main Topics  . Smoking status: Current Everyday Smoker    Types: Cigarettes  . Smokeless tobacco: Not on file   Comment: 1 pack  . Alcohol Use: Yes  . Drug Use: Yes    Special: Marijuana  . Sexually Active: Not on file   Other Topics Concern  . Not on file   Social History Narrative   Lives in [place] with male friend [name]), and her family.  They have been together x 16yrs but are no longer sexually active.  Not working. 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 a grown daughter     Physical Exam: Blood pressure 133/78, pulse 78, temperature 98.3 F (36.8 C), temperature source Rectal, resp. rate 14, SpO2 99.00%. Gen: Well-developed, well-nourished male  in no acute distress; alert, appropriate and cooperative throughout examination. Head: Normocephalic, atraumatic. Eyes: PERRL, EOMI, No signs of anemia or jaundince. Nose: Mucous membranes moist, not inflammed, nonerythematous. Throat: Oropharynx nonerythematous, no exudate appreciated. Very poor  dentition.  Neck: Supple with no adenopathy Lungs: Normal respiratory effort. Clear to auscultation BL, without crackles or wheezes. Heart: RRR. S1 and S2 normal without  murmur, gallop,or rubs. Abdomen: BS normoactive. Soft, nondistended, non-tender. No masses or organomegaly. Extremities: No pretibial edema. Neurologic: A&O X3, CN II - XII are grossly intact. Motor strength is 5/5 in the all 4 extremities, with the exception of the LUE where triceps strength is 4+. Biceps, brachioradialis and patellar reflexes 2+.  Sensations intact to light touch. No focal neurologic deficit.  No pronator drift. RAM intact. No tremor. No Babinski reflex.  Skin: No visible rashes, scars. Overall dry skin Psych: appears withdrawn and intoxicated.  Will not make eye contact. Very resistant to answering questions. Often will not answer questions appropriately  Lab results: Basic Metabolic Panel:  Basename 06/05/11 0109  NA 140  K 3.5  CL 104  CO2 25  GLUCOSE 134*  BUN 24*  CREATININE 1.89*  CALCIUM 8.8  MG --  PHOS --   Liver Function Tests:  Basename 06/05/11 0109  AST 14  ALT 11  ALKPHOS 112  BILITOT 0.1*  PROT 7.8  ALBUMIN 3.3*   No results found for this basename: LIPASE:2,AMYLASE:2 in the last 72 hours No results found for this basename: AMMONIA:2 in the last 72 hours CBC:  Basename 06/05/11 0109  WBC 8.6  NEUTROABS 5.8  HGB 11.8*  HCT 34.7*  MCV 87.2  PLT 250  Urine Drug Screen: Drugs of Abuse     Component Value Date/Time   LABOPIA NEG 06/24/2008 2035   COCAINSCRNUR NEG 06/24/2008 2035   LABBENZ NEG 06/24/2008 2035   AMPHETMU NEG 06/24/2008 2035    Alcohol Level:  Basename 06/05/11 0109  ETH 118*   Imaging results:  Dg Chest Portable 1 View  06/05/2011  *RADIOLOGY REPORT*  Clinical Data: Altered mental status  PORTABLE CHEST - 1 VIEW  Comparison: 10/07/2008  Findings: Mild vascular congestion.  Mild bibasilar airspace disease may be atelectasis or infiltrate.  No  significant effusion. Heart size is mildly enlarged. Right apical bleb.  IMPRESSION: Pulmonary vascular congestion suggesting mild fluid overload.  Mild bibasilar atelectasis or infiltrate.  Original Report Authenticated By: Truett Perna, M.D.    Other results: ED ECG REPORT   Date: 06/05/2011  EKG Time: 4:11 AM  Rate: 80  Rhythm: normal sinus rhythm,  unchanged from previous tracings  Axis: normal  Intervals:nonspecific intraventricular conduction delay  ST&T Change: jpoint elevation in V1-V3  Narrative Interpretation: NSR with IV conduction delay and LVH unchanged from prior tracing      Assessment & Plan by Problem: 1) Loss of Consciousness: 2/2 Syncope vs. Alcohol intoxication vs. New onset seizure. No prior Hx of LOC. Alcohol level 118 at 01:09 Neuro exam unrevealing. CT head pending.  Will favor alcohol intoxication at this point.  Plan: - Admit to tele bed. - CIWA protocol - Thiamine and Folate. - Wait for CT head results - if negative- and pt gets better in am- likely D/C home.  2) HIV: last CD4 count 470. Last seen by Dr. Drucilla Schmidt in [facility] on 04/27/2011 after a long lapse in follow up- about a year. Recently started back on his HAART meds. Has been non-compliant to all his meds in past.  Plan:  - Continue HAART meds. - Check CD4 count and VL  3) Hypertension: BP low normal range on presentation in ER. Got moderately elevated later.  Plan: Continue Norvasc. - Hold Lisinopril due to concern of AKI and possible dehydration.  4) Polysubstance abuse: Hx of severe Alcohol along with marijuana and previous Cocaine abuse.  Plan: CSW consult.  5) Depression: As per PMH- after HIV Dx.  Plan: Hold Celexa until Mental status improves.  6) AKI - very mild. likely 2/2 dehydration and ETOH intoxication  Plan: volume resuscitate with 1 lNS and hold lisinopril  DVT Proph: LMWH  Signed: Shia Delaine 06/05/2011, 4:10 AM

## 2011-06-05 NOTE — ED Notes (Signed)
Paged [facility] for Dr Marnette Burgess [phone]

## 2011-06-05 NOTE — ED Notes (Signed)
EMS called to [facility] for CPR.  PT was not rcvng cpr - had shallow rr and was able to verbally respond.  Pt not c/o pain.  Per EMS, pt had been drinking.  Presently pt unresponsive.

## 2011-06-05 NOTE — Progress Notes (Signed)
06/05/2011 Physical Therapy Note:  Orders received, chart reviewed;  Pt will need to be off bedrest in order to proceed with PT eval; Please increase activity to "as tolerated" when appropriate;  Will follow up 12/17; Thanks, [place], [place]

## 2011-06-05 NOTE — Progress Notes (Signed)
Chaplain Note:  Chaplain responded to code STEMI called for pt at 00:26.  Code was soon cancelled.  Chaplain introduced Ellary Casamento to pt and provided spiritual comfort and support.  Chaplain escorted pt's family to family room and provided comfort and support for them.  Chaplain acted as Dentist between Building control surveyor and family.  Chaplain advocated for family for visitation and to get communication from clinical staff.  (The ED was extremely busy and all clinical staff was strained.)  Family was grateful for chaplain support.  Chaplain also provided support for clinical staff and went with physician to talk with family about pt's condition.   06/05/11 0026  Clinical Encounter Type  Visited With Patient and family together;Health care provider  Visit Type Spiritual support  Referral From Other (Comment) (Trauma Pager)  Spiritual Encounters  Spiritual Needs Emotional  Stress Factors  Patient Stress Factors None identified  Family Stress Factors Loss of control    Jearld Lesch, chaplain resident [phone]

## 2011-06-06 ENCOUNTER — Telehealth: Payer: Self-pay

## 2011-06-06 LAB — T-HELPER CELLS (CD4) COUNT (NOT AT ARMC)
CD4 % Helper T Cell: 42 % (ref 33–55)
CD4 T Cell Abs: 510 uL (ref 400–2700)

## 2011-06-06 LAB — GC/CHLAMYDIA PROBE AMP, URINE
Chlamydia, Swab/Urine, PCR: NEGATIVE
GC Probe Amp, Urine: NEGATIVE

## 2011-06-06 NOTE — Telephone Encounter (Signed)
Patient significant other called and wanted to know how to get patient into housing, etc - she said he talked to or was referred to a case worker, but could not recall the name - I suggested [facility] and gave her the phone # to contact Amy Faw - I also checked with [place] to see if they worked with him, and they had not - she said she would call [facility].

## 2011-06-07 LAB — HIV-1 RNA QUANT-NO REFLEX-BLD
HIV 1 RNA Quant: 220 copies/mL — ABNORMAL HIGH (ref <20)
HIV-1 RNA Quant, Log: 2.34 {Log} — ABNORMAL HIGH (ref <1.30)

## 2011-07-04 ENCOUNTER — Ambulatory Visit: Payer: Medicare Other | Admitting: Infectious Disease

## 2011-07-06 ENCOUNTER — Other Ambulatory Visit: Payer: Self-pay | Admitting: Licensed Clinical Social Worker

## 2011-07-06 ENCOUNTER — Ambulatory Visit (INDEPENDENT_AMBULATORY_CARE_PROVIDER_SITE_OTHER): Payer: Medicare Other | Admitting: Infectious Disease

## 2011-07-06 ENCOUNTER — Encounter: Payer: Self-pay | Admitting: Infectious Disease

## 2011-07-06 VITALS — BP 139/89 | HR 73 | Temp 97.9°F | Ht 67.0 in | Wt 133.5 lb

## 2011-07-06 DIAGNOSIS — B20 Human immunodeficiency virus [HIV] disease: Secondary | ICD-10-CM

## 2011-07-06 DIAGNOSIS — E785 Hyperlipidemia, unspecified: Secondary | ICD-10-CM

## 2011-07-06 DIAGNOSIS — F17200 Nicotine dependence, unspecified, uncomplicated: Secondary | ICD-10-CM

## 2011-07-06 DIAGNOSIS — I10 Essential (primary) hypertension: Secondary | ICD-10-CM

## 2011-07-06 MED ORDER — DARUNAVIR ETHANOLATE 800 MG PO TABS: 800.0000 mg | ORAL_TABLET | Freq: Every day | ORAL | Status: DC

## 2011-07-06 MED ORDER — NICOTINE 21 MG/24HR TD PT24: 1.0000 | MEDICATED_PATCH | TRANSDERMAL | Status: AC

## 2011-07-06 MED ORDER — EMTRICITABINE-TENOFOVIR DF 200-300 MG PO TABS: 1.0000 | ORAL_TABLET | Freq: Every day | ORAL | Status: DC

## 2011-07-06 MED ORDER — NICOTINE 14 MG/24HR TD PT24: 1.0000 | MEDICATED_PATCH | TRANSDERMAL | Status: AC

## 2011-07-06 MED ORDER — NICOTINE 7 MG/24HR TD PT24: 1.0000 | MEDICATED_PATCH | TRANSDERMAL | Status: AC

## 2011-07-06 MED ORDER — RITONAVIR 100 MG PO TABS: 100.0000 mg | ORAL_TABLET | Freq: Every day | ORAL | Status: DC

## 2011-07-06 NOTE — Assessment & Plan Note (Signed)
Recheck vira load and cd4 count

## 2011-07-06 NOTE — Assessment & Plan Note (Signed)
Continue pravachol.  

## 2011-07-06 NOTE — Assessment & Plan Note (Signed)
Wrote for nicotine patches

## 2011-07-06 NOTE — Assessment & Plan Note (Signed)
BP better controlled today

## 2011-07-06 NOTE — Progress Notes (Signed)
  Subjective:    Patient ID: Peter Cox, male    DOB: 09/05/1960, 51 y.o.   MRN: [id number]  HPI  51 year old with multiple medical problems including HIV whom I saw most recently in November. He was subsequently hospitalized at [facility] with alcohol intoxication and acute renal failure due to dehydration. His VL had it turns out dropped into the 100s while o therapy at that time. He now returns for followup with his friend (former girlfriend) who does a great deal of the talking in our interview. The pt denies daily etoh consumption but admitted to binge drinking. He is not enrolled in [place]. He says that he has been [place] ghis meds reliably since teh beginnin of the month when he obtaiend them "again." Otherwise he is in good spirtis and has no complaints. I spent greater than 45 minutes with the patient including greater than 50% of time in face to face counsel of the patient and in coordination of their care.   Review of Systems  Constitutional: Negative for fever, chills, diaphoresis, activity change, appetite change, fatigue and unexpected weight change.  HENT: Negative for congestion, sore throat, rhinorrhea, sneezing, trouble swallowing and sinus pressure.   Eyes: Negative for photophobia and visual disturbance.  Respiratory: Negative for cough, chest tightness, shortness of breath, wheezing and stridor.   Cardiovascular: Negative for chest pain, palpitations and leg swelling.  Gastrointestinal: Negative for nausea, vomiting, abdominal pain, diarrhea, constipation, blood in stool, abdominal distention and anal bleeding.  Genitourinary: Negative for dysuria, hematuria, flank pain and difficulty urinating.  Musculoskeletal: Negative for myalgias, back pain, joint swelling, arthralgias and gait problem.  Skin: Negative for color change, pallor, rash and wound.  Neurological: Negative for dizziness, tremors, weakness and light-headedness.  Hematological: Negative for adenopathy. Does not  bruise/bleed easily.  Psychiatric/Behavioral: Negative for behavioral problems, confusion, sleep disturbance, dysphoric mood, decreased concentration and agitation.       Objective:   Physical Exam  Constitutional: He is oriented to person, place, and time. He appears well-developed and well-nourished. No distress.  HENT:  Head: Normocephalic and atraumatic.  Mouth/Throat: Oropharynx is clear and moist. No oropharyngeal exudate.  Eyes: Conjunctivae and EOM are normal. Pupils are equal, round, and reactive to light. No scleral icterus.  Neck: Normal range of motion. Neck supple. No JVD present.  Cardiovascular: Normal rate, regular rhythm and normal heart sounds.  Exam reveals no gallop and no friction rub.   No murmur heard. Pulmonary/Chest: Effort normal and breath sounds normal. No respiratory distress. He has no wheezes. He has no rales. He exhibits no tenderness.  Abdominal: He exhibits no distension and no mass. There is no tenderness. There is no rebound and no guarding.  Musculoskeletal: He exhibits no edema and no tenderness.  Lymphadenopathy:    He has no cervical adenopathy.  Neurological: He is alert and oriented to person, place, and time. He has normal reflexes. He exhibits normal muscle tone. Coordination normal.  Skin: Skin is warm and dry. He is not diaphoretic. No erythema. No pallor.  Psychiatric: He has a normal mood and affect. His behavior is normal.          Assessment & Plan:  HIV INFECTION Recheck vira load and cd4 count  TOBACCO ABUSE Wrote for nicotine patches  HYPERTENSION BP better controlled today  DYSLIPIDEMIA Continue pravachol

## 2011-07-07 LAB — CBC WITH DIFFERENTIAL/PLATELET
Basophils Absolute: 0 10*3/uL (ref 0.0–0.1)
Basophils Relative: 1 % (ref 0–1)
Eosinophils Absolute: 0.2 10*3/uL (ref 0.0–0.7)
Eosinophils Relative: 4 % (ref 0–5)
HCT: 44.2 % (ref 39.0–52.0)
Hemoglobin: 14.9 g/dL (ref 13.0–17.0)
Lymphocytes Relative: 31 % (ref 12–46)
Lymphs Abs: 1.3 10*3/uL (ref 0.7–4.0)
MCH: 30.3 pg (ref 26.0–34.0)
MCHC: 33.7 g/dL (ref 30.0–36.0)
MCV: 89.8 fL (ref 78.0–100.0)
Monocytes Absolute: 0.2 10*3/uL (ref 0.1–1.0)
Monocytes Relative: 5 % (ref 3–12)
Neutro Abs: 2.5 10*3/uL (ref 1.7–7.7)
Neutrophils Relative %: 59 % (ref 43–77)
Platelets: 205 10*3/uL (ref 150–400)
RBC: 4.92 MIL/uL (ref 4.22–5.81)
RDW: 15 % (ref 11.5–15.5)
WBC: 4.3 10*3/uL (ref 4.0–10.5)

## 2011-07-07 LAB — COMPLETE METABOLIC PANEL WITH GFR
ALT: 14 U/L (ref 0–53)
AST: 15 U/L (ref 0–37)
Albumin: 4.2 g/dL (ref 3.5–5.2)
Alkaline Phosphatase: 102 U/L (ref 39–117)
BUN: 23 mg/dL (ref 6–23)
CO2: 27 mEq/L (ref 19–32)
Calcium: 9.2 mg/dL (ref 8.4–10.5)
Chloride: 105 mEq/L (ref 96–112)
Creat: 1.55 mg/dL — ABNORMAL HIGH (ref 0.50–1.35)
GFR, Est African American: 59 mL/min — ABNORMAL LOW
GFR, Est Non African American: 51 mL/min — ABNORMAL LOW
Glucose, Bld: 71 mg/dL (ref 70–99)
Potassium: 3.9 mEq/L (ref 3.5–5.3)
Sodium: 141 mEq/L (ref 135–145)
Total Bilirubin: 0.4 mg/dL (ref 0.3–1.2)
Total Protein: 7.6 g/dL (ref 6.0–8.3)

## 2011-07-07 LAB — T-HELPER CELL (CD4) - (RCID CLINIC ONLY)
CD4 % Helper T Cell: 37 % (ref 33–55)
CD4 T Cell Abs: 500 uL (ref 400–2700)

## 2011-07-08 LAB — HIV-1 RNA QUANT-NO REFLEX-BLD
HIV 1 RNA Quant: 39 copies/mL — ABNORMAL HIGH (ref <20)
HIV-1 RNA Quant, Log: 1.59 {Log} — ABNORMAL HIGH (ref <1.30)

## 2011-07-14 LAB — HLA B*5701: HLA-B*5701: NEGATIVE

## 2011-08-04 ENCOUNTER — Other Ambulatory Visit: Payer: Medicare Other

## 2011-08-04 DIAGNOSIS — B20 Human immunodeficiency virus [HIV] disease: Secondary | ICD-10-CM

## 2011-08-04 LAB — CBC WITH DIFFERENTIAL/PLATELET
Basophils Absolute: 0 10*3/uL (ref 0.0–0.1)
Basophils Relative: 1 % (ref 0–1)
Eosinophils Absolute: 0.2 10*3/uL (ref 0.0–0.7)
Eosinophils Relative: 4 % (ref 0–5)
HCT: 43.8 % (ref 39.0–52.0)
Hemoglobin: 14.9 g/dL (ref 13.0–17.0)
Lymphocytes Relative: 23 % (ref 12–46)
Lymphs Abs: 1.2 10*3/uL (ref 0.7–4.0)
MCH: 30.1 pg (ref 26.0–34.0)
MCHC: 34 g/dL (ref 30.0–36.0)
MCV: 88.5 fL (ref 78.0–100.0)
Monocytes Absolute: 0.2 10*3/uL (ref 0.1–1.0)
Monocytes Relative: 3 % (ref 3–12)
Neutro Abs: 3.9 10*3/uL (ref 1.7–7.7)
Neutrophils Relative %: 70 % (ref 43–77)
Platelets: 224 10*3/uL (ref 150–400)
RBC: 4.95 MIL/uL (ref 4.22–5.81)
RDW: 13.8 % (ref 11.5–15.5)
WBC: 5.5 10*3/uL (ref 4.0–10.5)

## 2011-08-04 LAB — COMPLETE METABOLIC PANEL WITH GFR
ALT: 9 U/L (ref 0–53)
AST: 10 U/L (ref 0–37)
Albumin: 4 g/dL (ref 3.5–5.2)
Alkaline Phosphatase: 108 U/L (ref 39–117)
BUN: 21 mg/dL (ref 6–23)
CO2: 28 mEq/L (ref 19–32)
Calcium: 9.1 mg/dL (ref 8.4–10.5)
Chloride: 105 mEq/L (ref 96–112)
Creat: 1.59 mg/dL — ABNORMAL HIGH (ref 0.50–1.35)
GFR, Est African American: 57 mL/min — ABNORMAL LOW
GFR, Est Non African American: 49 mL/min — ABNORMAL LOW
Glucose, Bld: 77 mg/dL (ref 70–99)
Potassium: 4.1 mEq/L (ref 3.5–5.3)
Sodium: 141 mEq/L (ref 135–145)
Total Bilirubin: 0.3 mg/dL (ref 0.3–1.2)
Total Protein: 7.8 g/dL (ref 6.0–8.3)

## 2011-08-05 LAB — T-HELPER CELL (CD4) - (RCID CLINIC ONLY)
CD4 % Helper T Cell: 41 % (ref 33–55)
CD4 T Cell Abs: 570 uL (ref 400–2700)

## 2011-08-08 LAB — HIV-1 RNA QUANT-NO REFLEX-BLD
HIV 1 RNA Quant: 20 copies/mL (ref <20)
HIV-1 RNA Quant, Log: 1.3 {Log} (ref <1.30)

## 2011-08-17 ENCOUNTER — Ambulatory Visit: Payer: Medicare Other | Admitting: Infectious Disease

## 2011-08-17 ENCOUNTER — Telehealth: Payer: Self-pay | Admitting: Licensed Clinical Social Worker

## 2011-08-17 NOTE — Telephone Encounter (Signed)
This patient did not show for his appointment, I called him and I was able to reschedule for 08/29/2011 at 11:00am.

## 2011-08-29 ENCOUNTER — Encounter: Payer: Self-pay | Admitting: Infectious Disease

## 2011-08-29 ENCOUNTER — Ambulatory Visit (INDEPENDENT_AMBULATORY_CARE_PROVIDER_SITE_OTHER): Payer: Medicare Other | Admitting: Infectious Disease

## 2011-08-29 VITALS — BP 204/128 | HR 69 | Temp 98.3°F | Wt 131.0 lb

## 2011-08-29 DIAGNOSIS — E785 Hyperlipidemia, unspecified: Secondary | ICD-10-CM

## 2011-08-29 DIAGNOSIS — I10 Essential (primary) hypertension: Secondary | ICD-10-CM

## 2011-08-29 DIAGNOSIS — F17200 Nicotine dependence, unspecified, uncomplicated: Secondary | ICD-10-CM

## 2011-08-29 DIAGNOSIS — F329 Major depressive disorder, single episode, unspecified: Secondary | ICD-10-CM

## 2011-08-29 DIAGNOSIS — B20 Human immunodeficiency virus [HIV] disease: Secondary | ICD-10-CM

## 2011-08-29 DIAGNOSIS — F1010 Alcohol abuse, uncomplicated: Secondary | ICD-10-CM

## 2011-08-29 DIAGNOSIS — N259 Disorder resulting from impaired renal tubular function, unspecified: Secondary | ICD-10-CM

## 2011-08-29 DIAGNOSIS — F32A Depression, unspecified: Secondary | ICD-10-CM

## 2011-08-29 MED ORDER — RITONAVIR 100 MG PO TABS: 100.0000 mg | ORAL_TABLET | Freq: Every day | ORAL | Status: DC

## 2011-08-29 MED ORDER — DARUNAVIR ETHANOLATE 800 MG PO TABS: 800.0000 mg | ORAL_TABLET | Freq: Every day | ORAL | Status: DC

## 2011-08-29 MED ORDER — LISINOPRIL 40 MG PO TABS: 40.0000 mg | ORAL_TABLET | Freq: Every day | ORAL | Status: DC

## 2011-08-29 MED ORDER — PRAVASTATIN SODIUM 40 MG PO TABS: 40.0000 mg | ORAL_TABLET | Freq: Every day | ORAL | Status: DC

## 2011-08-29 MED ORDER — AMLODIPINE BESYLATE 10 MG PO TABS: 10.0000 mg | ORAL_TABLET | Freq: Every day | ORAL | Status: DC

## 2011-08-29 MED ORDER — CITALOPRAM HYDROBROMIDE 10 MG PO TABS: 10.0000 mg | ORAL_TABLET | Freq: Every day | ORAL | Status: DC

## 2011-08-29 MED ORDER — ABACAVIR SULFATE-LAMIVUDINE 600-300 MG PO TABS: 1.0000 | ORAL_TABLET | Freq: Every day | ORAL | Status: DC

## 2011-08-29 MED ORDER — NICOTINE 7 MG/24HR TD PT24: 1.0000 | MEDICATED_PATCH | TRANSDERMAL | Status: AC

## 2011-08-29 MED ORDER — NICOTINE 14 MG/24HR TD PT24: 1.0000 | MEDICATED_PATCH | TRANSDERMAL | Status: AC

## 2011-08-29 MED ORDER — NICOTINE 21 MG/24HR TD PT24: 1.0000 | MEDICATED_PATCH | TRANSDERMAL | Status: AC

## 2011-08-29 NOTE — Patient Instructions (Signed)
i have changed your regimen to   [name] ONE ORANGE PILL DAILY WITH  PREZISTA 800 MG ANOTHER ORANGE PILL DAILY  AND   NORVIR 100MG  WHITE PILL DAILY  YOU CAN STOP THE TRUVADA (BLUE PILL_)  i HAVE REWRITTEN RX FOR YOUR BLOOD PRESSURE AND CHOLESTEROL MEDICINES  RTC FOR LABS IN A MONTH AND APPT WITH DR VAN DAM IN MAY

## 2011-08-29 NOTE — Assessment & Plan Note (Signed)
Change to epzicom. Optimize BP control

## 2011-08-29 NOTE — Assessment & Plan Note (Signed)
Continue prezista and norvir now with epzicom instead of truvada

## 2011-08-29 NOTE — Assessment & Plan Note (Signed)
Re-wrote scripts to his mail order pharmacy. Bring back in one month for labs appt in May

## 2011-08-29 NOTE — Assessment & Plan Note (Signed)
Check lipids ON STATIN

## 2011-08-29 NOTE — Assessment & Plan Note (Signed)
counselled to quit and wrote nicotine patches for him

## 2011-08-29 NOTE — Progress Notes (Signed)
  Subjective:    Patient ID: Peter Cox, male    DOB: 06/27/1960, 51 y.o.   MRN: [id number]  HPI  51 year old with hx  HTN, smoking and HIV which is well controlled. Also with hx of heavy etoh use at times with one admission after syncope with renal failure. He has maintained near perfect virological suppression. He has not been taking ANY of his anti-HTNsive meds and we are filling them today. He is also not on a statin at present. He still drinks at times and is not in aa. Still smoking as well. I have decided to exchange his truvada for epzicom given his CRI. I spent greater than 45 minutes with the patient including greater than 50% of time in face to face counsel of the patient and in coordination of their care.   Review of Systems  Constitutional: Negative for fever, chills, diaphoresis, activity change, appetite change, fatigue and unexpected weight change.  HENT: Negative for congestion, sore throat, rhinorrhea, sneezing, trouble swallowing and sinus pressure.   Eyes: Negative for photophobia and visual disturbance.  Respiratory: Negative for cough, chest tightness, shortness of breath, wheezing and stridor.   Cardiovascular: Negative for chest pain, palpitations and leg swelling.  Gastrointestinal: Negative for nausea, vomiting, abdominal pain, diarrhea, constipation, blood in stool, abdominal distention and anal bleeding.  Genitourinary: Negative for dysuria, hematuria, flank pain and difficulty urinating.  Musculoskeletal: Negative for myalgias, back pain, joint swelling, arthralgias and gait problem.  Skin: Negative for color change, pallor, rash and wound.  Neurological: Negative for dizziness, tremors, weakness and light-headedness.  Hematological: Negative for adenopathy. Does not bruise/bleed easily.  Psychiatric/Behavioral: Negative for behavioral problems, confusion, sleep disturbance, dysphoric mood, decreased concentration and agitation.       Objective:   Physical  Exam  Constitutional: He is oriented to person, place, and time. He appears well-developed and well-nourished. No distress.  HENT:  Head: Normocephalic and atraumatic.  Mouth/Throat: Oropharynx is clear and moist. No oropharyngeal exudate.  Eyes: Conjunctivae and EOM are normal. Pupils are equal, round, and reactive to light. No scleral icterus.  Neck: Normal range of motion. Neck supple. No JVD present.  Cardiovascular: Normal rate, regular rhythm and normal heart sounds.  Exam reveals no gallop and no friction rub.   No murmur heard. Pulmonary/Chest: Effort normal and breath sounds normal. No respiratory distress. He has no wheezes. He has no rales. He exhibits no tenderness.  Abdominal: He exhibits no distension and no mass. There is no tenderness. There is no rebound and no guarding.  Musculoskeletal: He exhibits no edema and no tenderness.  Lymphadenopathy:    He has no cervical adenopathy.  Neurological: He is alert and oriented to person, place, and time. He has normal reflexes. He exhibits normal muscle tone. Coordination normal.  Skin: Skin is warm and dry. He is not diaphoretic. No erythema. No pallor.  Psychiatric: He has a normal mood and affect. His behavior is normal. Judgment and thought content normal.          Assessment & Plan:  HYPERTENSION Re-wrote scripts to his mail order pharmacy. Bring back in one month for labs appt in May  TOBACCO ABUSE counselled to quit and wrote nicotine patches for him  DYSLIPIDEMIA Check lipids ON STATIN  RENAL INSUFFICIENCY Change to epzicom. Optimize BP control  HIV INFECTION Continue prezista and norvir now with epzicom instead of truvada  Alcohol abuse Poor insight into this problem. Needs to go to [place]

## 2011-08-29 NOTE — Assessment & Plan Note (Signed)
Poor insight into this problem. Needs to go to [place]

## 2011-09-26 ENCOUNTER — Other Ambulatory Visit: Payer: Self-pay | Admitting: Infectious Disease

## 2011-09-26 DIAGNOSIS — Z113 Encounter for screening for infections with a predominantly sexual mode of transmission: Secondary | ICD-10-CM

## 2011-10-19 ENCOUNTER — Other Ambulatory Visit: Payer: Medicare Other

## 2011-10-26 ENCOUNTER — Other Ambulatory Visit: Payer: Medicare Other

## 2011-10-26 DIAGNOSIS — B20 Human immunodeficiency virus [HIV] disease: Secondary | ICD-10-CM

## 2011-10-27 LAB — COMPLETE METABOLIC PANEL WITH GFR
ALT: 11 U/L (ref 0–53)
AST: 14 U/L (ref 0–37)
Albumin: 4.1 g/dL (ref 3.5–5.2)
Alkaline Phosphatase: 98 U/L (ref 39–117)
BUN: 17 mg/dL (ref 6–23)
CO2: 29 mEq/L (ref 19–32)
Calcium: 9.1 mg/dL (ref 8.4–10.5)
Chloride: 108 mEq/L (ref 96–112)
Creat: 1.28 mg/dL (ref 0.50–1.35)
GFR, Est African American: 73 mL/min
GFR, Est Non African American: 63 mL/min
Glucose, Bld: 88 mg/dL (ref 70–99)
Potassium: 3.9 mEq/L (ref 3.5–5.3)
Sodium: 144 mEq/L (ref 135–145)
Total Bilirubin: 0.3 mg/dL (ref 0.3–1.2)
Total Protein: 7 g/dL (ref 6.0–8.3)

## 2011-10-27 LAB — CBC WITH DIFFERENTIAL/PLATELET
Basophils Absolute: 0 10*3/uL (ref 0.0–0.1)
Basophils Relative: 1 % (ref 0–1)
Eosinophils Absolute: 0.3 10*3/uL (ref 0.0–0.7)
Eosinophils Relative: 4 % (ref 0–5)
HCT: 41.2 % (ref 39.0–52.0)
Hemoglobin: 13.8 g/dL (ref 13.0–17.0)
Lymphocytes Relative: 26 % (ref 12–46)
Lymphs Abs: 1.9 10*3/uL (ref 0.7–4.0)
MCH: 30.3 pg (ref 26.0–34.0)
MCHC: 33.5 g/dL (ref 30.0–36.0)
MCV: 90.4 fL (ref 78.0–100.0)
Monocytes Absolute: 0.3 10*3/uL (ref 0.1–1.0)
Monocytes Relative: 4 % (ref 3–12)
Neutro Abs: 4.8 10*3/uL (ref 1.7–7.7)
Neutrophils Relative %: 66 % (ref 43–77)
Platelets: 216 10*3/uL (ref 150–400)
RBC: 4.56 MIL/uL (ref 4.22–5.81)
RDW: 15.9 % — ABNORMAL HIGH (ref 11.5–15.5)
WBC: 7.3 10*3/uL (ref 4.0–10.5)

## 2011-10-27 LAB — LIPID PANEL
Cholesterol: 154 mg/dL (ref 0–200)
HDL: 49 mg/dL (ref >39)
LDL Cholesterol: 94 mg/dL (ref 0–99)
Total CHOL/HDL Ratio: 3.1 Ratio
Triglycerides: 57 mg/dL (ref <150)
VLDL: 11 mg/dL (ref 0–40)

## 2011-10-27 LAB — T-HELPER CELL (CD4) - (RCID CLINIC ONLY)
CD4 % Helper T Cell: 43 % (ref 33–55)
CD4 T Cell Abs: 770 uL (ref 400–2700)

## 2011-10-28 LAB — HIV-1 RNA QUANT-NO REFLEX-BLD
HIV 1 RNA Quant: <20 copies/mL (ref <20)
HIV-1 RNA Quant, Log: <1.3 {Log} (ref <1.30)

## 2011-11-02 ENCOUNTER — Ambulatory Visit (INDEPENDENT_AMBULATORY_CARE_PROVIDER_SITE_OTHER): Payer: Medicare Other | Admitting: Infectious Disease

## 2011-11-02 ENCOUNTER — Encounter: Payer: Self-pay | Admitting: Infectious Disease

## 2011-11-02 VITALS — BP 129/88 | HR 81 | Temp 97.4°F | Ht 65.0 in | Wt 127.5 lb

## 2011-11-02 DIAGNOSIS — B20 Human immunodeficiency virus [HIV] disease: Secondary | ICD-10-CM

## 2011-11-02 DIAGNOSIS — F17200 Nicotine dependence, unspecified, uncomplicated: Secondary | ICD-10-CM

## 2011-11-02 DIAGNOSIS — R413 Other amnesia: Secondary | ICD-10-CM

## 2011-11-02 DIAGNOSIS — I10 Essential (primary) hypertension: Secondary | ICD-10-CM

## 2011-11-02 DIAGNOSIS — F1010 Alcohol abuse, uncomplicated: Secondary | ICD-10-CM

## 2011-11-02 NOTE — Progress Notes (Signed)
Subjective:    Patient ID: Peter Cox, male    DOB: May 11, 1961, 51 y.o.   MRN: [id number]  HPI  51  year old with hx HTN, smoking and HIV which is superbly well l controlled. Also with hx of heavy etoh use at times with one admission after syncope with renal failure. He has maintained near perfect virological suppression. Peter bp had been poorly controlled at last visit but now having restarted Peter anti-HTNsives Peter bp is also under perfect control. He still does smoke and claims to be drinking a 22oz beer once weekly. He has no Cox specific complaints. He and Peter Cox have been trying to obtain housing for Peter Cox along with an aide to help him with supervision of Peter meds and hisblood pressure. I agree tha the would greatly benefit from all oft Peter esp sinc Peter wife has been having to monitor Peter pills and bp closely as it is.   Need supervision for meds and blood pressure  Review of Systems  Constitutional: Negative for fever, chills, diaphoresis, activity change, appetite change, fatigue and unexpected weight change.  HENT: Negative for congestion, sore throat, rhinorrhea, sneezing, trouble swallowing and sinus pressure.   Eyes: Negative for photophobia and visual disturbance.  Respiratory: Negative for cough, chest tightness, shortness of breath, wheezing and stridor.   Cardiovascular: Negative for chest pain, palpitations and leg swelling.  Gastrointestinal: Negative for nausea, vomiting, abdominal pain, diarrhea, constipation, blood in stool, abdominal distention and anal bleeding.  Genitourinary: Negative for dysuria, hematuria, flank pain and difficulty urinating.  Musculoskeletal: Negative for myalgias, back pain, joint swelling, arthralgias and gait problem.  Skin: Negative for color change, pallor, rash and wound.  Neurological: Negative for dizziness, tremors, weakness and light-headedness.  Hematological: Negative for adenopathy. Does not bruise/bleed easily.    Psychiatric/Behavioral: Negative for behavioral problems, confusion, sleep disturbance, dysphoric mood, decreased concentration and agitation.       Objective:   Physical Exam  Constitutional: He is oriented to person, place, and time. He appears well-developed and well-nourished. No distress.  HENT:  Head: Normocephalic and atraumatic.  Mouth/Throat: Oropharynx is clear and moist. No oropharyngeal exudate.  Eyes: Conjunctivae and EOM are normal. Pupils are equal, round, and reactive to light. No scleral icterus.  Neck: Normal range of motion. Neck supple. No JVD present.  Cardiovascular: Normal rate, regular rhythm and normal heart sounds.  Exam reveals no gallop and no friction rub.   No murmur heard. Pulmonary/Chest: Effort normal and breath sounds normal. No respiratory distress. He has no wheezes. He has no rales. He exhibits no tenderness.  Abdominal: He exhibits no distension and no mass. There is no tenderness. There is no rebound and no guarding.  Musculoskeletal: He exhibits no edema and no tenderness.  Lymphadenopathy:    He has no cervical adenopathy.  Neurological: He is alert and oriented to person, place, and time. He has normal reflexes. He exhibits normal muscle tone. Coordination normal.  Skin: Skin is warm and dry. He is not diaphoretic. No erythema. No pallor.  Psychiatric: He has a normal mood and affect. Peter behavior is normal. Judgment and thought content normal.          Assessment & Plan:  HIV INFECTION Perfect control!!  HYPERTENSION Excellent control!  MEMORY LOSS Needs supervision of meds. Cognitive impairment likley multifactorial and due to HIV, CVA  TOBACCO ABUSE He is cutting down on this  Alcohol abuse Claims to be drinking less now

## 2011-11-02 NOTE — Assessment & Plan Note (Signed)
Perfect control!!

## 2011-11-02 NOTE — Assessment & Plan Note (Signed)
Needs supervision of meds. Cognitive impairment likley multifactorial and due to HIV, CVA

## 2011-11-02 NOTE — Assessment & Plan Note (Signed)
Excellent control.   

## 2011-11-02 NOTE — Assessment & Plan Note (Signed)
Claims to be drinking less now

## 2011-11-02 NOTE — Assessment & Plan Note (Signed)
He is cutting down on this

## 2012-02-21 ENCOUNTER — Other Ambulatory Visit: Payer: Medicare Other

## 2012-03-05 ENCOUNTER — Ambulatory Visit (INDEPENDENT_AMBULATORY_CARE_PROVIDER_SITE_OTHER): Payer: Medicare Other | Admitting: Infectious Disease

## 2012-03-05 ENCOUNTER — Encounter: Payer: Self-pay | Admitting: Infectious Disease

## 2012-03-05 VITALS — BP 152/86 | HR 72 | Temp 98.0°F | Wt 127.0 lb

## 2012-03-05 DIAGNOSIS — Z23 Encounter for immunization: Secondary | ICD-10-CM

## 2012-03-05 DIAGNOSIS — I10 Essential (primary) hypertension: Secondary | ICD-10-CM

## 2012-03-05 DIAGNOSIS — B20 Human immunodeficiency virus [HIV] disease: Secondary | ICD-10-CM

## 2012-03-05 LAB — CBC WITH DIFFERENTIAL/PLATELET
Basophils Absolute: 0 10*3/uL (ref 0.0–0.1)
Basophils Relative: 0 % (ref 0–1)
Eosinophils Absolute: 0.1 10*3/uL (ref 0.0–0.7)
Eosinophils Relative: 3 % (ref 0–5)
HCT: 44.2 % (ref 39.0–52.0)
Hemoglobin: 14.9 g/dL (ref 13.0–17.0)
Lymphocytes Relative: 24 % (ref 12–46)
Lymphs Abs: 1.2 10*3/uL (ref 0.7–4.0)
MCH: 30.8 pg (ref 26.0–34.0)
MCHC: 33.7 g/dL (ref 30.0–36.0)
MCV: 91.5 fL (ref 78.0–100.0)
Monocytes Absolute: 0.3 10*3/uL (ref 0.1–1.0)
Monocytes Relative: 7 % (ref 3–12)
Neutro Abs: 3.3 10*3/uL (ref 1.7–7.7)
Neutrophils Relative %: 66 % (ref 43–77)
Platelets: 159 10*3/uL (ref 150–400)
RBC: 4.83 MIL/uL (ref 4.22–5.81)
RDW: 15.6 % — ABNORMAL HIGH (ref 11.5–15.5)
WBC: 4.9 10*3/uL (ref 4.0–10.5)

## 2012-03-05 NOTE — Assessment & Plan Note (Signed)
Perfect control continue Prezista Norvir [name]

## 2012-03-05 NOTE — Patient Instructions (Addendum)
Please make appt with [facility] (dR. HO)

## 2012-03-05 NOTE — Assessment & Plan Note (Signed)
Blood pressure slightly better on Norvasc needs followup in internal medicine clinic.

## 2012-03-05 NOTE — Progress Notes (Signed)
  Subjective:    Patient ID: Peter Cox, male    DOB: 04-03-1961, 51 y.o.   MRN: [id number]  HPI  Peter Cox is a 51 y.o. male who is doing superbly well on his  antiviral regimen, Prezista  Norvir and [name] with undetectable viral load and health cd4 count. He claims to be taking his amlodipine for blood pressure but blood pressure still not optimally controlled. Like him to make followup appointment with the outpatient clinic in internal medicine.   Review of Systems  Constitutional: Negative for fever, chills, diaphoresis, activity change, appetite change, fatigue and unexpected weight change.  HENT: Negative for congestion, sore throat, rhinorrhea, sneezing, trouble swallowing and sinus pressure.   Eyes: Negative for photophobia and visual disturbance.  Respiratory: Negative for cough, chest tightness, shortness of breath, wheezing and stridor.   Cardiovascular: Negative for chest pain, palpitations and leg swelling.  Gastrointestinal: Negative for nausea, vomiting, abdominal pain, diarrhea, constipation, blood in stool, abdominal distention and anal bleeding.  Genitourinary: Negative for dysuria, hematuria, flank pain and difficulty urinating.  Musculoskeletal: Negative for myalgias, back pain, joint swelling, arthralgias and gait problem.  Skin: Negative for color change, pallor, rash and wound.  Neurological: Negative for dizziness, tremors, weakness and light-headedness.  Hematological: Negative for adenopathy. Does not bruise/bleed easily.  Psychiatric/Behavioral: Negative for behavioral problems, confusion, disturbed wake/sleep cycle, dysphoric mood, decreased concentration and agitation.       Objective:   Physical Exam  Constitutional: He is oriented to person, place, and time. He appears well-developed and well-nourished. No distress.  HENT:  Head: Normocephalic and atraumatic.  Mouth/Throat: Oropharynx is clear and moist. No oropharyngeal exudate.  Eyes:  Conjunctivae normal and EOM are normal. Pupils are equal, round, and reactive to light. No scleral icterus.  Neck: Normal range of motion. Neck supple. No JVD present.  Cardiovascular: Normal rate, regular rhythm and normal heart sounds.  Exam reveals no gallop and no friction rub.   No murmur heard. Pulmonary/Chest: Effort normal and breath sounds normal. No respiratory distress. He has no wheezes. He has no rales. He exhibits no tenderness.  Abdominal: He exhibits no distension and no mass. There is no tenderness. There is no rebound and no guarding.  Musculoskeletal: He exhibits no edema and no tenderness.  Lymphadenopathy:    He has no cervical adenopathy.  Neurological: He is alert and oriented to person, place, and time. He has normal reflexes. He exhibits normal muscle tone. Coordination normal.  Skin: Skin is warm and dry. He is not diaphoretic. No erythema. No pallor.  Psychiatric: He has a normal mood and affect. His behavior is normal. Judgment and thought content normal.          Assessment & Plan:  HIV INFECTION Perfect control continue Prezista Norvir [name]  HYPERTENSION Blood pressure slightly better on Norvasc needs followup in internal medicine clinic.

## 2012-03-06 LAB — COMPLETE METABOLIC PANEL WITH GFR
ALT: 10 U/L (ref 0–53)
AST: 12 U/L (ref 0–37)
Albumin: 3.8 g/dL (ref 3.5–5.2)
Alkaline Phosphatase: 103 U/L (ref 39–117)
BUN: 19 mg/dL (ref 6–23)
CO2: 29 mEq/L (ref 19–32)
Calcium: 9.3 mg/dL (ref 8.4–10.5)
Chloride: 107 mEq/L (ref 96–112)
Creat: 1.41 mg/dL — ABNORMAL HIGH (ref 0.50–1.35)
GFR, Est African American: 65 mL/min
GFR, Est Non African American: 56 mL/min — ABNORMAL LOW
Glucose, Bld: 55 mg/dL — ABNORMAL LOW (ref 70–99)
Potassium: 4.1 mEq/L (ref 3.5–5.3)
Sodium: 141 mEq/L (ref 135–145)
Total Bilirubin: 0.3 mg/dL (ref 0.3–1.2)
Total Protein: 6.4 g/dL (ref 6.0–8.3)

## 2012-03-06 LAB — HIV-1 RNA QUANT-NO REFLEX-BLD
HIV 1 RNA Quant: 25336 copies/mL — ABNORMAL HIGH (ref <20)
HIV-1 RNA Quant, Log: 4.4 {Log} — ABNORMAL HIGH (ref <1.30)

## 2012-03-06 LAB — T-HELPER CELL (CD4) - (RCID CLINIC ONLY)
CD4 % Helper T Cell: 38 % (ref 33–55)
CD4 T Cell Abs: 440 uL (ref 400–2700)

## 2012-05-08 ENCOUNTER — Encounter: Payer: Self-pay | Admitting: Internal Medicine

## 2012-05-08 ENCOUNTER — Ambulatory Visit (INDEPENDENT_AMBULATORY_CARE_PROVIDER_SITE_OTHER): Payer: Medicare Other | Admitting: Internal Medicine

## 2012-05-08 VITALS — BP 201/109 | HR 64 | Temp 96.9°F | Resp 20 | Ht 66.5 in | Wt 126.5 lb

## 2012-05-08 DIAGNOSIS — N259 Disorder resulting from impaired renal tubular function, unspecified: Secondary | ICD-10-CM

## 2012-05-08 DIAGNOSIS — E785 Hyperlipidemia, unspecified: Secondary | ICD-10-CM

## 2012-05-08 DIAGNOSIS — I10 Essential (primary) hypertension: Secondary | ICD-10-CM

## 2012-05-08 DIAGNOSIS — N289 Disorder of kidney and ureter, unspecified: Secondary | ICD-10-CM

## 2012-05-08 MED ORDER — LISINOPRIL 40 MG PO TABS: 40.0000 mg | ORAL_TABLET | Freq: Every day | ORAL | Status: DC

## 2012-05-08 MED ORDER — AMLODIPINE BESYLATE 10 MG PO TABS: 10.0000 mg | ORAL_TABLET | Freq: Every day | ORAL | Status: DC

## 2012-05-08 NOTE — Assessment & Plan Note (Signed)
LDL 94 about 6 months ago.  Well controlled.  Will continue Pravachol 40mg  qhs and repeat lipid in 6 months

## 2012-05-08 NOTE — Patient Instructions (Addendum)
Please start taking your blood pressure medications: Lisinopril 40mg  one tablet daily and Norvasc 10mg  one tablet daily.  It is extremely important that you take these medications Follow up in 2 weeks

## 2012-05-08 NOTE — Assessment & Plan Note (Addendum)
Poorly controlled due to non-compliance, he has not been taking his medications in the past 3 weeks. Per his caretaker, he was on diuretics in the past but was taken off due to impair kidney function.  Baseline Cr is 1.28-1.89 in the past 1 year.  BP on arrival was 208/135. Repeat BP was 201/109.  No focal neurological deficits. -Will need to resume Lisinopril 40mg  qd and Norvasc 10mg  qd -Repeat BMP to make sure that his kidney function is stable -Follow up for BP check in 2 weeks, will need frequent follow up -Case discussed with attending, Dr. Cathren Laine

## 2012-05-08 NOTE — Progress Notes (Signed)
HPI: Peter Cox is a 51 year old man with past medical history of polysubstance abuse, HIV infection dose in February of 2010 on HAART regimen with last CD count 440 in 02/2012 and VL 25,000 copies, hypertension,Basal ganglia lacunar infarctions seen on CT head day for blood pressure control referred by Dr. Tommy Medal.  He states that he does not take his BP meds on a regular basis.  His last dose is about 3 weeks.  He had several strokes in 2007 and 2009 with some residual left upper extremity weakness.  Denies any weakness, headache, visual changes, or any focal neurological deficits. He drinks 3-4 cans of beer daily.  He smokes cigarettes 1 ppd x since age 47. He also does marijuana.   BP 208/135  ROS: as per HPI  PE: General: alert, cachetic appearing gentleman, and cooperative to examination.  Eyes: vision grossly intact, pupils equal, pupils round, pupils reactive to light, no injection and anicteric.  Mouth: pharynx pink and moist, no erythema, and no exudates.  Neck: supple, full ROM, no thyromegaly, no JVD, and no carotid bruits.  Lungs: normal respiratory effort, no accessory muscle use, normal breath sounds, no crackles, and no wheezes. Heart: normal rate, regular rhythm, no murmur, no gallop, and no rub.  Abdomen: soft, non-tender, normal bowel sounds, no distention, no guarding, no rebound tenderness, no hepatomegaly, and no splenomegaly.  Msk: no joint swelling, no joint warmth, and no redness over joints.  Pulses: 2+ DP/PT pulses bilaterally Extremities: No cyanosis, clubbing, edema Neurologic: normal strength upper and lower extremities, normal sensation.

## 2012-05-08 NOTE — Assessment & Plan Note (Signed)
Mild to Moderate impairment of kidney function.  GFR 65, Cr 1.41 in 02/2012 with baseline Cr 1.2-1.8.  Likely due to poorly controlled HTN and HIVAN.   -Will check urinalysis  -repeat BMP today -Consider nephrology referral in the future

## 2012-05-09 LAB — URINALYSIS, ROUTINE W REFLEX MICROSCOPIC
Bilirubin Urine: NEGATIVE
Glucose, UA: NEGATIVE mg/dL
Hgb urine dipstick: NEGATIVE
Ketones, ur: NEGATIVE mg/dL
Leukocytes, UA: NEGATIVE
Nitrite: NEGATIVE
Protein, ur: 100 mg/dL — AB
Specific Gravity, Urine: 1.024 (ref 1.005–1.030)
Urobilinogen, UA: 0.2 mg/dL (ref 0.0–1.0)
pH: 5.5 (ref 5.0–8.0)

## 2012-05-09 LAB — BASIC METABOLIC PANEL WITH GFR
BUN: 14 mg/dL (ref 6–23)
CO2: 28 mEq/L (ref 19–32)
Calcium: 8.8 mg/dL (ref 8.4–10.5)
Chloride: 107 mEq/L (ref 96–112)
Creat: 1.41 mg/dL — ABNORMAL HIGH (ref 0.50–1.35)
GFR, Est African American: 65 mL/min
GFR, Est Non African American: 56 mL/min — ABNORMAL LOW
Glucose, Bld: 75 mg/dL (ref 70–99)
Potassium: 4.1 mEq/L (ref 3.5–5.3)
Sodium: 141 mEq/L (ref 135–145)

## 2012-05-09 LAB — URINALYSIS, MICROSCOPIC ONLY
Bacteria, UA: NONE SEEN
Casts: NONE SEEN
Crystals: NONE SEEN

## 2012-05-23 ENCOUNTER — Ambulatory Visit (INDEPENDENT_AMBULATORY_CARE_PROVIDER_SITE_OTHER): Payer: Medicare Other | Admitting: Internal Medicine

## 2012-05-23 ENCOUNTER — Encounter: Payer: Self-pay | Admitting: Internal Medicine

## 2012-05-23 VITALS — BP 191/114 | HR 67 | Temp 97.2°F | Ht 66.5 in | Wt 131.4 lb

## 2012-05-23 DIAGNOSIS — I129 Hypertensive chronic kidney disease with stage 1 through stage 4 chronic kidney disease, or unspecified chronic kidney disease: Secondary | ICD-10-CM

## 2012-05-23 DIAGNOSIS — Z5181 Encounter for therapeutic drug level monitoring: Secondary | ICD-10-CM

## 2012-05-23 DIAGNOSIS — I10 Essential (primary) hypertension: Secondary | ICD-10-CM

## 2012-05-23 DIAGNOSIS — N189 Chronic kidney disease, unspecified: Secondary | ICD-10-CM

## 2012-05-23 LAB — BASIC METABOLIC PANEL WITH GFR
BUN: 16 mg/dL (ref 6–23)
CO2: 33 mEq/L — ABNORMAL HIGH (ref 19–32)
Calcium: 9.2 mg/dL (ref 8.4–10.5)
Chloride: 103 mEq/L (ref 96–112)
Creat: 1.28 mg/dL (ref 0.50–1.35)
GFR, Est African American: 73 mL/min
GFR, Est Non African American: 63 mL/min
Glucose, Bld: 115 mg/dL — ABNORMAL HIGH (ref 70–99)
Potassium: 3.7 mEq/L (ref 3.5–5.3)
Sodium: 141 mEq/L (ref 135–145)

## 2012-05-23 NOTE — Progress Notes (Addendum)
Subjective:     Patient ID: Peter Cox, male   DOB: Feb 01, 1961, 51 y.o.   MRN: [id number]  HPI 51 year old male with a PMH of HTN, HIV (last CD count 440 in 02/2012 and VL 25,000 copies), Renal Insuffiencey presents to the clinic for a follow-up visit for his blood pressure. His blood pressure has been poorly controlled due to non-compliance and his last visit (05/08/12) his BP was 208/135. His current BP medications are Lisinopril 40mg  qd and Norvasc 10mg  qd. The patient says he has not taken any BP mediations for the past 3 weeks and just got his new BP medications yesterday from his pharmacy. Patient denies chest pain, headaches, changes in vision, dizziness, syncope, palpitations, SOB, N/V, diarrhea/constipation, fevers/chills. Patient does not have a Cardiologist or Nephrologist. Patient was accompanied by his girlfriend who is encouraging him to take this medications.    Past Medical History  Diagnosis Date  . Hypertension   . Cocaine abuse      clean since about 2000  . Marijuana abuse   . Alcohol abuse     heavy, clean since 2005  . Tobacco abuse   . Mild cognitive impairment     likely vascular dementia/psa/?HIV componentCT Head 02/12/08 for htn crisis and concern for stroke: impression-1. Old right internal capsule and thalamic lacunar infarcts. 2. Mild to moderate chronic small vessell white matter ischemic changes in both cerebral hemisphere, greater on the right  . HIV infection     dx 08/08/2008 (unknown how he was exposed)  Initial VL 43,200 and CD4 410 on 08/28/08  . HIV (human immunodeficiency virus infection)    Past Surgical History  Procedure Date  . Hemorrhoid surgery   . Closed reduction patellar     right knee  . Shoulder arthroscopy 01/2008    w/extensive debridement (Dr Mardelle Matte)   Family History  Problem Relation Age of Onset  . Diabetes Mother   . Hypertension Mother   . Stroke Mother   . Diabetes Father   . Hypertension Sister   . Hypertension Sister     History   Social History  . Marital Status: Single    Spouse Name: N/A    Number of Children: N/A  . Years of Education: N/A   Social History Main Topics  . Smoking status: Current Every Day Smoker -- 0.5 packs/day    Types: Cigarettes  . Smokeless tobacco: Never Used     Comment: cut back to 1/2 ppd- using fake cigarette  . Alcohol Use: 0.0 oz/week     Comment: occassional  . Drug Use: 1 per week    Special: Marijuana  . Sexually Active: Yes -- Male partner(s)     Comment: pt. given condoms   Other Topics Concern  . Not on file   Social History Narrative   Lives in [place] with male friend [name]), and her family.  They have been together x 4yrs but are no longer sexually active.  Not working. 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 a grown daughter   Review of Systems  Constitutional: Negative for fever and chills.  HENT: Positive for neck pain (3 months). Negative for sore throat, sneezing and ear discharge.   Eyes: Negative.   Respiratory: Positive for cough (occasionally). Negative for chest tightness and shortness of breath.   Cardiovascular: Negative.   Gastrointestinal: Positive for nausea (2/2 to medications).  Genitourinary: Negative.   Musculoskeletal: Negative for myalgias, back pain, joint  swelling, arthralgias and gait problem.  Skin: Negative.   Neurological: Negative for headaches.  Hematological: Negative.   Psychiatric/Behavioral: Negative for confusion.       Objective:   Physical Exam  Constitutional: He is oriented to person, place, and time. No distress.  HENT:  Head: Normocephalic and atraumatic.  Right Ear: External ear normal.  Left Ear: External ear normal.  Eyes: Conjunctivae normal and EOM are normal. Pupils are equal, round, and reactive to light. Right eye exhibits no discharge. Left eye exhibits no discharge.  Neck: Normal range of motion. Neck supple. No tracheal deviation present.  Cardiovascular: Normal  rate, regular rhythm and normal heart sounds.  Exam reveals no gallop and no friction rub.   No murmur heard. Pulmonary/Chest: Effort normal and breath sounds normal. No respiratory distress. He has no wheezes. He has no rales. He exhibits no tenderness.  Abdominal: Soft. Bowel sounds are normal. He exhibits no distension and no mass. There is no tenderness. There is no rebound and no guarding.  Musculoskeletal: Normal range of motion. He exhibits no edema and no tenderness.  Lymphadenopathy:    He has no cervical adenopathy.  Neurological: He is alert and oriented to person, place, and time.  Skin: No rash noted. He is not diaphoretic. No erythema. No pallor.  Psychiatric: He has a normal mood and affect.       Assessment/Plan:     51 year old male presents for a follow up of his blood pressure. 1. HTN in the setting of chronic renal failure - uncontrolled 2/2 non-compliance - BMP checked in clinic.  - Advised patient to take his medications. He has not taken his blood pressure medications in 3 weeks. He did pick them up from the pharmacy yesterday (05/22/12), and he agreed to begin taking them today. Continue Lisinopril 40mg  qd and Norvasc 10mg  qd. - Ophthalmology referral for eye exam  Follow up in 2 weeks for recheck of BP.

## 2012-05-23 NOTE — Patient Instructions (Addendum)
Peter Cox,  It was nice seeing you today at the clinic. As we mentioned, it is very important that you take your medications every day as prescribed. Your blood pressure was dangerously high today. We would like you to begin taking your medications today and everyday without missing a dose. Take Lisinopril 40mg , one pill everyday and Norvasc 10mg , 1 pill everday. Please come back to see [name] in 2 weeks so we can recheck your blood pressure and see if you need any changes. If you have any questions, please feel free to call us.  If you develop a headache, dizziness, or chest pain, or if you feel sick for any reason in general, please call 911 immediately.    Hypertension Hypertension is another name for high blood pressure. High blood pressure may mean that your heart needs to work harder to pump blood. Blood pressure consists of two numbers, which includes a higher number over a lower number (example: 110/72). HOME CARE   Make lifestyle changes as told by your doctor. This may include weight loss and exercise.  Take your blood pressure medicine every day.  Limit how much salt you use.  Stop smoking if you smoke.  Do not use drugs.  Talk to your doctor if you are using decongestants or birth control pills. These medicines might make blood pressure higher.  Females should not drink more than 1 alcoholic drink per day. Males should not drink more than 2 alcoholic drinks per day.  See your doctor as told. GET HELP RIGHT AWAY IF:   You have a blood pressure reading with a top number of 180 or higher.  You get a very bad headache.  You get blurred or changing vision.  You feel confused.  You feel weak, numb, or faint.  You get chest or belly (abdominal) pain.  You throw up (vomit).  You cannot breathe very well. MAKE SURE YOU:   Understand these instructions.  Will watch your condition.  Will get help right away if you are not doing well or get worse. Document Released:  11/23/2007 Document Revised: 08/29/2011 Document Reviewed: 11/23/2007 [facility] Patient Information 2013 [place].

## 2012-05-23 NOTE — Progress Notes (Addendum)
Subjective:    Patient ID: Peter Cox, male    DOB: May 10, 1961, 51 y.o.   MRN: [id number]  HPI Peter Cox is a 51 year old african american man with HIV/AIDS who is being followed by Dr. Tommy Medal. He was referred to the clinic for management of hypertension on 05/08/12 because he has not been taking his medications. His most recent measured blood pressure are systolic in [id number] and diastolic in [id number]. Dr. Marcelle Smiling saw him on that day and she re-emphasized the need to be compliant and the patient agreed to resume his Lisinopril 40 mg and amlodipine 10 mg. He was supposed to come in today for a follow up so we can re-evaluate his blood pressure in the setting of resuming medications and add more medications on board if we need to.  Today Peter Cox comes in with a friend of his, a 51 year old lady who seems to be a positive influence, however, he informs me that he has still not started taking his medications. I was surprised but I calmly asked him asked him about what in his opinion stops him from taking his medications. He seems withdrawn and less willing to talk, however, he has normal affect and judgement. His friend instead tells [name] that the possible barrier to picking up the medication was that he did not have the money to pay for it and he is too proud to tell any one about it, not even her. She said, that she remembered yesterday that he had some medications called in for him which he has not picked up and told no one about it, thus she picked up his medications from the pharmacy yesterday and he finally has the pills at home. I asked him if he is willing to start taking them from today and he agreed.   He denies chest pain, visual disturbances, dizziness, headache, weakness, fainting, near syncope, palpitations, leg swelling or abdominal pain. He says "He is just fine".       Review of Systems  Constitutional: Negative.  Negative for fatigue.  HENT: Negative.   Eyes: Negative.   Negative for visual disturbance.  Respiratory: Positive for cough. Negative for chest tightness, shortness of breath and wheezing.   Cardiovascular: Negative for chest pain, palpitations and leg swelling.  Gastrointestinal: Negative.   Genitourinary: Negative.   Musculoskeletal: Negative.  Negative for myalgias and arthralgias.  Skin: Negative.   Neurological: Negative.  Negative for dizziness, seizures, syncope, speech difficulty, weakness, light-headedness and headaches.  Hematological: Negative.  Negative for adenopathy.  Psychiatric/Behavioral: Negative.       Objective:   Physical Exam  Constitutional: He is oriented to person, place, and time.       Thin built  HENT:  Head: Normocephalic and atraumatic.  Mouth/Throat: Oropharynx is clear and moist. No oropharyngeal exudate.       Has only 3-4 teeth in his mouth. No cavities infection seen. Gums seems healthy.  Eyes: Conjunctivae normal and EOM are normal. Pupils are equal, round, and reactive to light. Right eye exhibits no discharge. Left eye exhibits no discharge.  Neck: Normal range of motion. Neck supple. No JVD present.  Cardiovascular: Normal rate, regular rhythm, normal heart sounds and intact distal pulses.   No murmur heard. Pulmonary/Chest: Effort normal and breath sounds normal.  Abdominal: Soft. Bowel sounds are normal.  Musculoskeletal: Normal range of motion.  Lymphadenopathy:    He has no cervical adenopathy.  Neurological: He is alert and oriented to  person, place, and time.  Skin: Skin is warm.  Psychiatric: He has a normal mood and affect. His behavior is normal.      Assessment & Plan:   Severe Hypertension, non-compliance, with chronic kidney insufficiency. Peter Cox has Lisinopril and amlodipine at home and he has not started taking them. We discussed for more than 20 minutes today about this reasons for not taking the medicines and he finally agreed that he will go home and take them. His increasing  creatinine (1.59>1.28>1.41>1.41>1.28) is concerning, BMP today shows a decreasing trend.   Next visit, once he starts taking his medicines, if we need to add another medication, a beta blocker (good EF) or probably clonidine (however, given his non-compliance that is a risky medication due to rebound phenomenon). I have called him after 2 weeks.   Ophthalmology referral done for disc examination though the patient does not complain of any acute changes. A nephrology referral can be kept in mind, if the hypertension is resistant even after he takes his medications and his kidney function keeps worsesning.   For his other chronic issues which do not seem to be contributing to the current problem, he will follow up with his PCP, Dr. Silverio Decamp.    Thanks.

## 2012-06-07 ENCOUNTER — Ambulatory Visit: Payer: Medicare Other | Admitting: Internal Medicine

## 2012-06-07 ENCOUNTER — Other Ambulatory Visit: Payer: Medicare Other

## 2012-06-21 ENCOUNTER — Ambulatory Visit: Payer: Medicare Other | Admitting: Infectious Disease

## 2012-07-04 ENCOUNTER — Other Ambulatory Visit: Payer: Medicare Other

## 2012-07-18 ENCOUNTER — Ambulatory Visit: Payer: Medicare Other | Admitting: Infectious Disease

## 2012-10-03 ENCOUNTER — Encounter: Payer: Self-pay | Admitting: *Deleted

## 2012-11-22 ENCOUNTER — Encounter: Payer: Self-pay | Admitting: Internal Medicine

## 2012-11-22 ENCOUNTER — Observation Stay (HOSPITAL_COMMUNITY): Payer: Medicare Other

## 2012-11-22 ENCOUNTER — Inpatient Hospital Stay (HOSPITAL_COMMUNITY)
Admission: AD | Admit: 2012-11-22 | Discharge: 2012-11-23 | DRG: 602 | Disposition: A | Discharge status: Home / Self Care | Payer: Medicare Other | Source: Ambulatory Visit | Attending: Internal Medicine | Admitting: Internal Medicine

## 2012-11-22 ENCOUNTER — Encounter (HOSPITAL_COMMUNITY): Payer: Self-pay | Admitting: Internal Medicine

## 2012-11-22 ENCOUNTER — Ambulatory Visit (INDEPENDENT_AMBULATORY_CARE_PROVIDER_SITE_OTHER): Payer: Medicare Other | Admitting: Internal Medicine

## 2012-11-22 VITALS — BP 200/106 | HR 76 | Temp 99.3°F | Wt 128.6 lb

## 2012-11-22 DIAGNOSIS — R1319 Other dysphagia: Secondary | ICD-10-CM | POA: Diagnosis present

## 2012-11-22 DIAGNOSIS — L03115 Cellulitis of right lower limb: Secondary | ICD-10-CM

## 2012-11-22 DIAGNOSIS — Z21 Asymptomatic human immunodeficiency virus [HIV] infection status: Secondary | ICD-10-CM

## 2012-11-22 DIAGNOSIS — Z9119 Patient's noncompliance with other medical treatment and regimen: Secondary | ICD-10-CM

## 2012-11-22 DIAGNOSIS — Z91199 Patient's noncompliance with other medical treatment and regimen due to unspecified reason: Secondary | ICD-10-CM

## 2012-11-22 DIAGNOSIS — B20 Human immunodeficiency virus [HIV] disease: Secondary | ICD-10-CM

## 2012-11-22 DIAGNOSIS — Z79899 Other long term (current) drug therapy: Secondary | ICD-10-CM

## 2012-11-22 DIAGNOSIS — F1910 Other psychoactive substance abuse, uncomplicated: Secondary | ICD-10-CM | POA: Diagnosis present

## 2012-11-22 DIAGNOSIS — I10 Essential (primary) hypertension: Secondary | ICD-10-CM

## 2012-11-22 DIAGNOSIS — F1210 Cannabis abuse, uncomplicated: Secondary | ICD-10-CM | POA: Diagnosis present

## 2012-11-22 DIAGNOSIS — F1410 Cocaine abuse, uncomplicated: Secondary | ICD-10-CM | POA: Diagnosis present

## 2012-11-22 DIAGNOSIS — L02611 Cutaneous abscess of right foot: Secondary | ICD-10-CM | POA: Insufficient documentation

## 2012-11-22 DIAGNOSIS — F1010 Alcohol abuse, uncomplicated: Secondary | ICD-10-CM | POA: Diagnosis present

## 2012-11-22 DIAGNOSIS — N259 Disorder resulting from impaired renal tubular function, unspecified: Secondary | ICD-10-CM

## 2012-11-22 DIAGNOSIS — Z833 Family history of diabetes mellitus: Secondary | ICD-10-CM

## 2012-11-22 DIAGNOSIS — Z7982 Long term (current) use of aspirin: Secondary | ICD-10-CM

## 2012-11-22 DIAGNOSIS — F17200 Nicotine dependence, unspecified, uncomplicated: Secondary | ICD-10-CM | POA: Diagnosis present

## 2012-11-22 DIAGNOSIS — L03119 Cellulitis of unspecified part of limb: Principal | ICD-10-CM

## 2012-11-22 DIAGNOSIS — Z823 Family history of stroke: Secondary | ICD-10-CM

## 2012-11-22 DIAGNOSIS — L02619 Cutaneous abscess of unspecified foot: Principal | ICD-10-CM

## 2012-11-22 DIAGNOSIS — Z8249 Family history of ischemic heart disease and other diseases of the circulatory system: Secondary | ICD-10-CM

## 2012-11-22 DIAGNOSIS — E785 Hyperlipidemia, unspecified: Secondary | ICD-10-CM

## 2012-11-22 LAB — COMPLETE METABOLIC PANEL WITH GFR
ALT: <8 U/L (ref 0–53)
AST: 11 U/L (ref 0–37)
Albumin: 3.3 g/dL — ABNORMAL LOW (ref 3.5–5.2)
Alkaline Phosphatase: 102 U/L (ref 39–117)
BUN: 16 mg/dL (ref 6–23)
CO2: 29 mEq/L (ref 19–32)
Calcium: 8.8 mg/dL (ref 8.4–10.5)
Chloride: 104 mEq/L (ref 96–112)
Creat: 1.54 mg/dL — ABNORMAL HIGH (ref 0.50–1.35)
GFR, Est African American: 58 mL/min — ABNORMAL LOW
GFR, Est Non African American: 50 mL/min — ABNORMAL LOW
Glucose, Bld: 122 mg/dL — ABNORMAL HIGH (ref 70–99)
Potassium: 3.8 mEq/L (ref 3.5–5.3)
Sodium: 138 mEq/L (ref 135–145)
Total Bilirubin: 0.5 mg/dL (ref 0.3–1.2)
Total Protein: 7.2 g/dL (ref 6.0–8.3)

## 2012-11-22 LAB — GLUCOSE, CAPILLARY: Glucose-Capillary: 117 mg/dL — ABNORMAL HIGH (ref 70–99)

## 2012-11-22 LAB — CBC WITH DIFFERENTIAL/PLATELET
Basophils Absolute: 0 10*3/uL (ref 0.0–0.1)
Basophils Relative: 0 % (ref 0–1)
Eosinophils Absolute: 0.2 10*3/uL (ref 0.0–0.7)
Eosinophils Relative: 3 % (ref 0–5)
HCT: 42.7 % (ref 39.0–52.0)
Hemoglobin: 14.9 g/dL (ref 13.0–17.0)
Lymphocytes Relative: 18 % (ref 12–46)
Lymphs Abs: 1.2 10*3/uL (ref 0.7–4.0)
MCH: 30.2 pg (ref 26.0–34.0)
MCHC: 34.9 g/dL (ref 30.0–36.0)
MCV: 86.4 fL (ref 78.0–100.0)
Monocytes Absolute: 0.3 10*3/uL (ref 0.1–1.0)
Monocytes Relative: 4 % (ref 3–12)
Neutro Abs: 4.8 10*3/uL (ref 1.7–7.7)
Neutrophils Relative %: 75 % (ref 43–77)
Platelets: 180 10*3/uL (ref 150–400)
RBC: 4.94 MIL/uL (ref 4.22–5.81)
RDW: 13.6 % (ref 11.5–15.5)
WBC: 6.4 10*3/uL (ref 4.0–10.5)

## 2012-11-22 LAB — CBC
HCT: 39.8 % (ref 39.0–52.0)
Hemoglobin: 13.8 g/dL (ref 13.0–17.0)
MCH: 30.3 pg (ref 26.0–34.0)
MCHC: 34.7 g/dL (ref 30.0–36.0)
MCV: 87.5 fL (ref 78.0–100.0)
Platelets: 161 10*3/uL (ref 150–400)
RBC: 4.55 MIL/uL (ref 4.22–5.81)
RDW: 12.8 % (ref 11.5–15.5)
WBC: 6.8 10*3/uL (ref 4.0–10.5)

## 2012-11-22 LAB — LIPID PANEL
Cholesterol: 140 mg/dL (ref 0–200)
HDL: 36 mg/dL — ABNORMAL LOW (ref >39)
LDL Cholesterol: 87 mg/dL (ref 0–99)
Total CHOL/HDL Ratio: 3.9 Ratio
Triglycerides: 87 mg/dL (ref <150)
VLDL: 17 mg/dL (ref 0–40)

## 2012-11-22 LAB — CREATININE, SERUM
Creatinine, Ser: 1.64 mg/dL — ABNORMAL HIGH (ref 0.50–1.35)
GFR calc Af Amer: 53 mL/min — ABNORMAL LOW (ref >90)
GFR calc non Af Amer: 46 mL/min — ABNORMAL LOW (ref >90)

## 2012-11-22 LAB — POCT GLYCOSYLATED HEMOGLOBIN (HGB A1C): Hemoglobin A1C: 5.1

## 2012-11-22 MED ORDER — SIMVASTATIN 5 MG PO TABS
5.0000 mg | ORAL_TABLET | Freq: Every day | ORAL | Status: DC
  Administered 2012-11-22: 5 mg via ORAL
  Filled 2012-11-22 (×2): qty 1

## 2012-11-22 MED ORDER — HEPARIN SODIUM (PORCINE) 5000 UNIT/ML IJ SOLN
5000.0000 [IU] | Freq: Three times a day (TID) | INTRAMUSCULAR | Status: DC
  Administered 2012-11-22 – 2012-11-23 (×4): 5000 [IU] via SUBCUTANEOUS
  Filled 2012-11-22 (×5): qty 1

## 2012-11-22 MED ORDER — ASPIRIN EC 81 MG PO TBEC
81.0000 mg | DELAYED_RELEASE_TABLET | Freq: Every day | ORAL | Status: DC
  Administered 2012-11-22 – 2012-11-23 (×2): 81 mg via ORAL
  Filled 2012-11-22 (×2): qty 1

## 2012-11-22 MED ORDER — ASPIRIN 81 MG PO TABS: 81.0000 mg | ORAL_TABLET | Freq: Every day | ORAL | Status: DC

## 2012-11-22 MED ORDER — SODIUM CHLORIDE 0.9 % IJ SOLN
3.0000 mL | Freq: Two times a day (BID) | INTRAMUSCULAR | Status: DC
  Administered 2012-11-22 – 2012-11-23 (×3): 3 mL via INTRAVENOUS

## 2012-11-22 MED ORDER — CLINDAMYCIN PHOSPHATE 900 MG/50ML IV SOLN
900.0000 mg | Freq: Three times a day (TID) | INTRAVENOUS | Status: DC
  Administered 2012-11-22 – 2012-11-23 (×4): 900 mg via INTRAVENOUS
  Filled 2012-11-22 (×6): qty 50

## 2012-11-22 MED ORDER — HYDRALAZINE HCL 20 MG/ML IJ SOLN
10.0000 mg | INTRAMUSCULAR | Status: DC | PRN
  Administered 2012-11-23: 10 mg via INTRAVENOUS
  Filled 2012-11-22: qty 1

## 2012-11-22 MED ORDER — ACETAMINOPHEN ER 650 MG PO TBCR: 650.0000 mg | EXTENDED_RELEASE_TABLET | Freq: Three times a day (TID) | ORAL | Status: DC | PRN

## 2012-11-22 MED ORDER — SODIUM CHLORIDE 0.9 % IJ SOLN: 3.0000 mL | INTRAMUSCULAR | Status: DC | PRN

## 2012-11-22 MED ORDER — AMLODIPINE BESYLATE 10 MG PO TABS
10.0000 mg | ORAL_TABLET | Freq: Every day | ORAL | Status: DC
  Administered 2012-11-22 – 2012-11-23 (×2): 10 mg via ORAL
  Filled 2012-11-22 (×2): qty 1

## 2012-11-22 MED ORDER — ACETAMINOPHEN 325 MG PO TABS: 650.0000 mg | ORAL_TABLET | ORAL | Status: DC | PRN

## 2012-11-22 MED ORDER — SODIUM CHLORIDE 0.9 % IV SOLN: 250.0000 mL | INTRAVENOUS | Status: DC | PRN

## 2012-11-22 NOTE — Progress Notes (Signed)
1140AM - report called to nurse on 6N; pt transferred via w/c.

## 2012-11-22 NOTE — Assessment & Plan Note (Signed)
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 Will need f/u on hospital d/c, ? Renal referral at next [facility] visit

## 2012-11-22 NOTE — Progress Notes (Signed)
Patient ID: Peter Cox, male   DOB: 07-May-1961, 52 y.o.   MRN: [id number]  Subjective:   Patient ID: Peter Cox male   DOB: [birth year] 52 y.o.   MRN: [id number]  HPI: PeterBravery D Cox is a 52 y.o. male with history of HTN, MCI, HIV, CKD presenting to the clinic for evaluation of R foot redness/swelling. Patient has difficulty providing his own history due to mild cognitive impairment 2/2 ischemic brain dz. He says that he woke up Tuesday morning with redness and pain over the dorsum of his R foot. Since taht time, redness, pain and swelling have all worsened, now involving great toe. No antecedent trauma/injury. No cuts, cannot remember being bitten. Never had this happen before. No other joints involved. Unsure if chills or fever.  In regards to his chronic problems, for his HTN he has not filled or been taking any of his antiHTN since 05/2012. He is prescribed lisinopril 40mg  qd and amlodipine 10mg  qd.  For his HIV, he reports no compliance w HIV meds for the past 6-9 months. Did not attend last visit at [facility] this spring. CD4 440 and VL 25,336 in 02/2012.  Prior to this time, disease adequately suppressed on ART. He has also been noted to have some chronic renal insufficiency, although Cr has been downtrending (1.59>1.28>1.41>1.41>1.28).   Past Medical History  Diagnosis Date  . Hypertension   . Cocaine abuse      clean since about 2000  . Marijuana abuse   . Alcohol abuse     heavy, clean since 2005  . Tobacco abuse   . Mild cognitive impairment     likely vascular dementia/psa/?HIV componentCT Head 02/12/08 for htn crisis and concern for stroke: impression-1. Old right internal capsule and thalamic lacunar infarcts. 2. Mild to moderate chronic small vessell white matter ischemic changes in both cerebral hemisphere, greater on the right  . HIV infection     dx 08/08/2008 (unknown how he was exposed)  Initial VL 43,200 and CD4 410 on 08/28/08  . HIV (human immunodeficiency virus  infection)    Current Outpatient Prescriptions  Medication Sig Dispense Refill  . abacavir-lamiVUDine ([name]) 600-300 MG per tablet Take 1 tablet by mouth daily.  30 tablet  11  . acetaminophen (TYLENOL) 650 MG CR tablet Take 650 mg by mouth every 8 (eight) hours as needed. Headache pain or fever      . amLODipine (NORVASC) 10 MG tablet Take 1 tablet (10 mg total) by mouth daily.  30 tablet  6  . aspirin 81 MG tablet Take 81 mg by mouth daily.        . citalopram (CELEXA) 10 MG tablet Take 1 tablet (10 mg total) by mouth daily.  30 tablet  6  . Darunavir Ethanolate (PREZISTA) 800 MG tablet Take 1 tablet (800 mg total) by mouth daily.  30 tablet  11  . lisinopril (PRINIVIL,ZESTRIL) 40 MG tablet Take 1 tablet (40 mg total) by mouth daily.  30 tablet  6  . pravastatin (PRAVACHOL) 40 MG tablet Take 1 tablet (40 mg total) by mouth at bedtime.  30 tablet  6  . ritonavir (NORVIR) 100 MG TABS Take 1 tablet (100 mg total) by mouth daily.  30 tablet  11   No current facility-administered medications for this visit.   Family History  Problem Relation Age of Onset  . Diabetes Mother   . Hypertension Mother   . Stroke Mother   . Diabetes Father   .  Hypertension Sister   . Hypertension Sister    History   Social History  . Marital Status: Single    Spouse Name: N/A    Number of Children: N/A  . Years of Education: N/A   Social History Main Topics  . Smoking status: Current Every Day Smoker -- 0.50 packs/day    Types: Cigarettes  . Smokeless tobacco: Never Used     Comment: trying to cut; tried electric cigs.  . Alcohol Use: No     Comment: last drink 2-3 months ago.  . Drug Use: No  . Sexually Active: None     Comment: pt. given condoms   Other Topics Concern  . None   Social History Narrative   Lives in [place] with male friend [name]), and her family.  They have been together x 29yrs but are no longer sexually active.  Not working. 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 General: sitting in wheelchair, NAD HEENT: most teeth are absent. PERRL  Cardiac: RRR, no rubs, murmurs or gallops Pulm: clear to auscultation bilaterally, no rales Abd: soft, nontender, nondistended, BS present Ext: R foot is erythematous, edematous, taut, and warm over distal dorsum of foot to great toe. No streaking erythema/tracking. No fluctuance or weeping. Difficult to bend toes 2/2 pain. No involvement of ankle joint. DP pulse is 2+.   Assessment & Plan:   Please see problem-based charting for assessment and plan.

## 2012-11-22 NOTE — Assessment & Plan Note (Signed)
Clinically appears as cellulitis of R foot. No skin breakdown or ulcers. No abscess. Low suspicion for osteomyelitis. Gout also in ddx as significant involvement R great toe. - Admit for IV abx, Xray, cbc, blood cx - concern for ability to tolerate po abx as outpt due to med noncompliance.

## 2012-11-22 NOTE — Progress Notes (Signed)
Patient's BP still elevated MD on call notified.

## 2012-11-22 NOTE — Progress Notes (Signed)
Appt schedule w/Dr Drucilla Schmidt, at [facility], on July 10.2014@ 1515PM.

## 2012-11-22 NOTE — Consult Note (Signed)
[facility]  Total days of antibiotics 1        Day 1 clindamycin               Reason for Consult:HAART and cellulitis in hiv patient off of HAART    Referring Physician: garg  Principal Problem:   Cellulitis of foot, right Active Problems:   HIV INFECTION   SUBSTANCE ABUSE, MULTIPLE   HYPERTENSION    HPI: Peter Cox is a 52 y.o. male with HIV, Cd 4 count of 440(38%)/ VL 25,336 previously on epzicom boosted prezista regimen, who was last seen in Fall of 2013 by Dr. Tommy Medal for his HIV. Since then, he has stopped taking his HIV meds as well as sporadically taking his antihypertensive. He came to the Internal medicine clinic due to having increasing pain, swelling and redness to his right foot. He reports that it originally started in his great toe and expanded to dorsum of foot in the course of 7 days. He states that he has been treating his pain by smoking marijuana. He was brought in by his mother. He reports stopping his hiv medications due to difficulty with swallowing. Over the course of the last 6 months, he denies any overt illness, but has had 7 lb weight loss, and increasing fatigue. He denies diarrhea, fever, chills, nightsweats, occ headache with elevated blood pressures  No current facility-administered medications on file prior to encounter.   Current Outpatient Prescriptions on File Prior to Encounter  Medication Sig Dispense Refill  . amLODipine (NORVASC) 10 MG tablet Take 1 tablet (10 mg total) by mouth daily.  30 tablet  6  . aspirin 81 MG tablet Take 81 mg by mouth daily.        . citalopram (CELEXA) 10 MG tablet Take 1 tablet (10 mg total) by mouth daily.  30 tablet  6  . Darunavir Ethanolate (PREZISTA) 800 MG tablet Take 1 tablet (800 mg total) by mouth daily.  30 tablet  11  . lisinopril (PRINIVIL,ZESTRIL) 40 MG tablet Take 1 tablet (40 mg total) by mouth daily.  30 tablet  6  . pravastatin (PRAVACHOL) 40 MG tablet Take 1 tablet (40  mg total) by mouth at bedtime.  30 tablet  6  . ritonavir (NORVIR) 100 MG TABS Take 1 tablet (100 mg total) by mouth daily.  30 tablet  11   Active Ambulatory Problems    Diagnosis Date Noted  . HIV INFECTION 08/17/2008  . DYSLIPIDEMIA 06/26/2008  . SUBSTANCE ABUSE, MULTIPLE 08/08/2008  . HYPERTENSION 06/24/2008  . RENAL INSUFFICIENCY 11/20/2008  . ARTHRITIS 06/24/2008  . MEMORY LOSS 08/08/2008  . Cellulitis of foot, right 11/22/2012  . HIV disease 11/22/2012  . Essential hypertension, benign 11/22/2012   Resolved Ambulatory Problems    Diagnosis Date Noted  . ANEMIA 11/20/2008  . DEPRESSION, ACUTE 02/04/2010  . TOBACCO ABUSE 06/24/2008  . INFLUENZA LIKE ILLNESS 07/31/2009  . UNSPECIFIED DISORDER TEETH&SUPPORTING STRUCTURES 10/21/2008  . POSITIVE PPD 10/07/2008  . FOOT&TOE BLISTER WITHOUT MENTION OF INFECTION 10/28/2008  . Loss of consciousness 06/05/2011  . Alcohol abuse 08/29/2011   Past Medical History  Diagnosis Date  . Hypertension   . Cocaine abuse   . Marijuana abuse   . Tobacco abuse   . Mild cognitive impairment   . HIV infection      Past Medical History  Diagnosis Date  . Hypertension   . Cocaine abuse      clean  since about 2000  . Marijuana abuse   . Alcohol abuse     heavy, clean since April  . Tobacco abuse   . Mild cognitive impairment     likely vascular dementia/psa/?HIV componentCT Head 02/12/08 for htn crisis and concern for stroke: impression-1. Old right internal capsule and thalamic lacunar infarcts. 2. Mild to moderate chronic small vessell white matter ischemic changes in both cerebral hemisphere, greater on the right  . HIV infection     dx 08/08/2008 (unknown how he was exposed)  Initial VL 43,200 and CD4 410 on 08/28/08    Allergies: No Known Allergies   MEDICATIONS: . amLODipine  10 mg Oral Daily  . aspirin EC  81 mg Oral Daily  . clindamycin (CLEOCIN) IV  900 mg Intravenous Q8H  . heparin  5,000 Units Subcutaneous Q8H  .  simvastatin  5 mg Oral q1800  . sodium chloride  3 mL Intravenous Q12H    History  Substance Use Topics  . Smoking status: Current Every Day Smoker -- 0.50 packs/day for 40 years    Types: Cigarettes  . Smokeless tobacco: Never Used     Comment: trying to cut; tried electric cigs.  . Alcohol Use: No     Comment: last drink 2-3 months ago.    Family History  Problem Relation Age of Onset  . Diabetes Mother   . Hypertension Mother   . Stroke Mother   . Diabetes Father   . Hypertension Sister   . Hypertension Sister     Review of Systems  Constitutional: Negative for fever, chills, diaphoresis, activity change, appetite change, fatigue and unexpected weight change.  HENT: Negative for congestion, sore throat, rhinorrhea, sneezing, trouble swallowing and sinus pressure.  Eyes: Negative for photophobia and visual disturbance.  Respiratory: Negative for cough, chest tightness, shortness of breath, wheezing and stridor.  Cardiovascular: Negative for chest pain, palpitations and leg swelling.  Gastrointestinal: Negative for nausea, vomiting, abdominal pain, diarrhea, constipation, blood in stool, abdominal distention and anal bleeding.  Genitourinary: Negative for dysuria, hematuria, flank pain and difficulty urinating.  Musculoskeletal: pain to right foot and right great toe  Skin: Negative for color change, pallor, rash and wound.  Neurological: Negative for dizziness, tremors, weakness and light-headedness.  Hematological: Negative for adenopathy. Does not bruise/bleed easily.  Psychiatric/Behavioral: Negative for behavioral problems, confusion, sleep disturbance, dysphoric mood, decreased concentration and agitation.     OBJECTIVE: Temp:  [98.1 F (36.7 C)-99.3 F (37.4 C)] 98.1 F (36.7 C) (06/05 2102) Pulse Rate:  [66-96] 70 (06/05 2102) Resp:  [16-18] 16 (06/05 2102) BP: (180-209)/(99-122) 197/107 mmHg (06/05 2102) SpO2:  [98 %-100 %] 100 % (06/05 2102) Weight:  [128  lb 9.6 oz (58.333 kg)] 128 lb 9.6 oz (58.333 kg) (06/05 1315)  Physical Exam  Constitutional: He is oriented to person, place, and time. He appears well-developed and well-nourished. No distress.  HENT: bitemporal wasting, mild Mouth/Throat: Oropharynx is clear and moist. No oropharyngeal exudate.  Cardiovascular: Normal rate, regular rhythm and normal heart sounds. Exam reveals no gallop and no friction rub.  No murmur heard.  Pulmonary/Chest: Effort normal and breath sounds normal. No respiratory distress. He has no wheezes.  Abdominal: Soft. Bowel sounds are normal. He exhibits no distension. There is no tenderness.  Lymphadenopathy:  no cervical adenopathy.  Neurological: He is alert and oriented to person, place, and time.  Ext: right great toe is swollen and extends to dorsum of foot associated with tenderness to touch.  Skin: Skin  is warm and dry. No rash noted. Erythema great toe and dorsum of foot Psychiatric: He has a normal mood and affect. His behavior is normal.    LABS: Results for orders placed during the hospital encounter of 11/22/12 (from the past 48 hour(s))  CBC     Status: None   Collection Time    11/22/12  2:46 PM      Result Value Range   WBC 6.8  4.0 - 10.5 K/uL   RBC 4.55  4.22 - 5.81 MIL/uL   Hemoglobin 13.8  13.0 - 17.0 g/dL   HCT 39.8  39.0 - 52.0 %   MCV 87.5  78.0 - 100.0 fL   MCH 30.3  26.0 - 34.0 pg   MCHC 34.7  30.0 - 36.0 g/dL   RDW 12.8  11.5 - 15.5 %   Platelets 161  150 - 400 K/uL  CREATININE, SERUM     Status: Abnormal   Collection Time    11/22/12  2:46 PM      Result Value Range   Creatinine, Ser 1.64 (*) 0.50 - 1.35 mg/dL   GFR calc non Af Amer 46 (*) >90 mL/min   GFR calc Af Amer 53 (*) >90 mL/min   Comment:            The eGFR has been calculated     using the CKD EPI equation.     This calculation has not been     validated in all clinical     situations.     eGFR's persistently     <90 mL/min signify     possible Chronic  Kidney Disease.     IMAGING: Dg Foot 2 Views Right  11/22/2012   *RADIOLOGY REPORT*  Clinical Data: Pain and swelling.  RIGHT FOOT - 2 VIEW  Comparison: None  Findings: The joint spaces are maintained.  No acute bony findings or destructive bony changes. Diffuse forefoot soft tissue swelling is noted.  IMPRESSION: No acute bony findings or destructive bony changes.   Original Report Authenticated By: Marijo Sanes, M.D.    Assessment/Plan:  52yo Male with HIV, CD 4 count of 44, with detectable viral load not currently on HAART  hiv = recommend to check cd 4 count, viral load, genotype. Does not appear to be motivated to restart hiv medicaiton. Will readdress in the morning. Pending lab values, will decide if need to give oi proph  Cellulitis vs. Gouty flare = currently on clindamycin. If no improvement in 2-3 days, may need to consider that this was a gouty flare or gouty flare now complicated by 2nd infetion  Hypertension = defer to primary team for evaluaion  Hyperlipidemia = continue with current regimen  Ayslin Kundert B. [facility] [phone]

## 2012-11-22 NOTE — Assessment & Plan Note (Signed)
Not taking any ART, no showed recent [facility] appt. -notify ID of hospital admission, ? Plugged into bridge counselor for med compliance concerns in setting of cognitive impairment - ordered CD4 count, VL, genotype

## 2012-11-22 NOTE — Assessment & Plan Note (Signed)
Poor control due to med noncompliance. Will address med compliance at next visit once discharged from hospital. He will at the minimum need HH for meds as he has demonstrated inability/unwillingness to take on his own - likely related to cognitive decline. Maybe we can get him plugged in to bridge counselor through [facility] during hospital admission, otherwise will have our CSW follow up at next [facility] visit.

## 2012-11-22 NOTE — H&P (Signed)
Hospital Admission Note Date: 11/22/2012  Patient name: Peter Cox Medical record number: [id number] Date of birth: 04/02/1961 Age: 52 y.o. Gender: male PCP: [name], MD   Service:  Internal Medicine Teaching Service   Attending Physician:  Dr. Janell Quiet    First Contact:   Dr. Juleen China Pager:  [phone] Second Contact:   Dr. Blaine Hamper  Pager: [phone]      After 5PM, weekends, and holidays: First Contact:    Pager: [phone] Second Contact:    Pager: [phone]   Chief Complaint:  Foot swelling and pain    History of Present Illness:  52 year old with hypertension and HIV presenting to clinic with a swollen right foot that is painful. Pain began 3 days ago at the medial aspect of the right first toe. Swelling of the toe been presented in the last 3 days has spread to include the distal half of the right foot. Patient cannot identify any precipitating injury or break in the skin. Pain does not radiate beyond the distal half of the foot. Movement of the toes and walking exacerbate the pain. Nothing has been tried to alleviate the pain. Not associated with fevers and chills.     Review of Systems:   Constitutional: Negative for fever and chills.  HENT: Negative for hearing loss, ear pain, congestion and sore throat.   Eyes: Positive for blurred vision (chronic). Negative for pain.  Respiratory: Negative for cough and shortness of breath.   Cardiovascular: Negative for chest pain and palpitations.  Gastrointestinal: Negative for nausea, vomiting, abdominal pain, diarrhea, constipation and blood in stool.  Genitourinary: Negative for dysuria and hematuria.  Musculoskeletal: Negative for joint pain.  Skin: Negative for rash.  Neurological: Negative for dizziness and headaches.  Endo/Heme/Allergies: Negative for environmental allergies. No hot flashes or chills. Lymph: No lymphadenopathy.    Medical History: Past Medical History  Diagnosis Date  . Hypertension   . Cocaine abuse      clean since about 2000  . Marijuana abuse   . Alcohol abuse     heavy, clean since April  . Tobacco abuse   . Mild cognitive impairment     likely vascular dementia/psa/?HIV componentCT Head 02/12/08 for htn crisis and concern for stroke: impression-1. Old right internal capsule and thalamic lacunar infarcts. 2. Mild to moderate chronic small vessell white matter ischemic changes in both cerebral hemisphere, greater on the right  . HIV infection     dx 08/08/2008 (unknown how he was exposed)  Initial VL 43,200 and CD4 410 on 08/28/08    Surgical History: Past Surgical History  Procedure Laterality Date  . Hemorrhoid surgery    . Closed reduction patellar      right knee  . Shoulder arthroscopy  01/2008    w/extensive debridement (Dr Mardelle Matte)    Home Medications: Current Outpatient Rx  Name  Route  Sig  Dispense  Refill  . EXPIRED: abacavir-lamiVUDine ([name]) 600-300 MG per tablet   Oral   Take 1 tablet by mouth daily.   30 tablet   11   . acetaminophen (TYLENOL) 650 MG CR tablet   Oral   Take 650 mg by mouth every 8 (eight) hours as needed. Headache pain or fever         . amLODipine (NORVASC) 10 MG tablet   Oral   Take 1 tablet (10 mg total) by mouth daily.   30 tablet   6   . aspirin 81 MG tablet   Oral   Take  81 mg by mouth daily.           . citalopram (CELEXA) 10 MG tablet   Oral   Take 1 tablet (10 mg total) by mouth daily.   30 tablet   6   . Darunavir Ethanolate (PREZISTA) 800 MG tablet   Oral   Take 1 tablet (800 mg total) by mouth daily.   30 tablet   11   . lisinopril (PRINIVIL,ZESTRIL) 40 MG tablet   Oral   Take 1 tablet (40 mg total) by mouth daily.   30 tablet   6   . pravastatin (PRAVACHOL) 40 MG tablet   Oral   Take 1 tablet (40 mg total) by mouth at bedtime.   30 tablet   6   . ritonavir (NORVIR) 100 MG TABS   Oral   Take 1 tablet (100 mg total) by mouth daily.   30 tablet   11     Allergies: Allergies as of  11/22/2012  . (No Known Allergies)    Family History: Family History  Problem Relation Age of Onset  . Diabetes Mother   . Hypertension Mother   . Stroke Mother   . Diabetes Father   . Hypertension Sister   . Hypertension Sister     Social History: History   Social History  . Marital Status: Single    Spouse Name: N/A    Number of Children: N/A  . Years of Education: N/A   Occupational History  . Not on file.   Social History Main Topics  . Smoking status: Current Every Day Smoker -- 0.50 packs/day    Types: Cigarettes  . Smokeless tobacco: Never Used     Comment: trying to cut; tried electric cigs.  . Alcohol Use: No     Comment: last drink 2-3 months ago.  . Drug Use: 7.00 per week    Special: Marijuana  . Sexually Active: Yes -- Male partner(s)    Birth Control/ Protection: Condom     Comment: 1 partner, condoms occasionally   Other Topics Concern  . Not on file   Social History Narrative   Lives in [place] with male friend [name]), and her family.  They have been together x 12yrs but are no longer sexually active.  Not working. 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 a grown daughter     Physical exam: VITALS: BP 200/106, HR 76, temp 99.51F, SpO2 98% on room air GENERAL: well developed, well nourished; no acute distress HEAD: atraumatic, normocephalic EYES: pupils equal, round and reactive; sclera anicteric; normal conjunctiva EARS: canals patent and TMs normal bilaterally NOSE/THROAT: oropharynx clear, moist mucous membranes, pink gums, very poor dentition with only a few teeth remaining NECK: supple, thyroid normal in size and without palpable nodules LYMPH: scattered lymphadenopathy in the cervical chains bilaterally LUNGS: clear to auscultation bilaterally, normal work of breathing HEART: normal rate and regular rhythm; normal S1 and S2 without S3 or S4; no murmurs, rubs, or clicks PULSES: radial, dorsalis pedis, and posterior  tibial pulses are 2+ and symmetric ABDOMEN: soft, non-tender, normal bowel sounds, no masses or organomegaly MSK: pain with range of motion exercises with the right toes, right ankle is nontender MOTOR: 5/5 dorsiflexion and plantarflexion bilaterally SENSATION: intact, specifically tested in the feet and toes bilaterally REFLEXES: 2+ patellar reflexes, symmetric bilaterally CRANIAL NERVES: pupils reactive to light bilaterally; extra occular muscles are intact; smile is symmetric; uvula is midline and palate elevates symmetrically;  tongue protrudes midline. SKIN: erythema of the right first toe extending over the distal and medial fourth of the foot; area of induration with palpation measuring approximately 3 cm in diameter at the base of the right first phalanx; warm, dry, intact, normal turgor; no rashes EXTREMITIES: no peripheral edema or cyanosis, clubbing of all 10 fingers PSYCH: patient is alert and oriented, mood and affect are normal and congruent     Lab results: CBG:  11/22/12 1102  GLUCAP 117*    Hemoglobin A1C:  11/22/12 1119  HGBA1C 5.1    Assessment and Plan:  1.   Cellulitis:  Favor cellulitis over gout given the palpable induration in the soft tissues of the foot. We are checking a CBC for leukocytosis. Patient is afebrile. We will begin treatment with IV clindamycin and obtain plain radiography of the right foot.  Blood cultures pending. Patient is nondiabetic; hemoglobin A1c 5.1. - IV clindamycin - Plain radiography, right foot   2.   Hypertension:   Patient was on amlodipine 10 lisinopril 40. We will restart amlodipine and order PRN hydralazine IV 10 mg q4 for SBP > 190 overnight. - CMET  3.   HIV:  Patient has not been on ATC since October. We are checking a CD4 count and HIV viral load. We will consult infectious disease for restarting ART.  - ID consult - HIV VL - CD4 count  4.   Subsutance abuse:  Marijuana and tobacco.   - Social work  5.   Medication  noncompliance:  Patient stopped taking the medicines because he said the pills were too big and he had a hard time swallowing them. We will make sure he gets on medicines that he will be able to continue taking as an outpatient.  6.   Prophylaxis:  Heparin  7.   Disposition:  OPC patient.  Observation status.    Signed by:  Shann Medal. Juleen China, MD PGY-I, Internal Medicine  11/22/2012, 11:40 AM

## 2012-11-23 ENCOUNTER — Observation Stay (HOSPITAL_COMMUNITY): Payer: Medicare Other

## 2012-11-23 LAB — HIV-1 RNA ULTRAQUANT REFLEX TO GENTYP+
HIV 1 RNA Quant: 25548 copies/mL — ABNORMAL HIGH (ref <20)
HIV-1 RNA Quant, Log: 4.41 {Log} — ABNORMAL HIGH (ref <1.30)

## 2012-11-23 LAB — CBC
HCT: 44.3 % (ref 39.0–52.0)
Hemoglobin: 15.3 g/dL (ref 13.0–17.0)
MCH: 30.1 pg (ref 26.0–34.0)
MCHC: 34.5 g/dL (ref 30.0–36.0)
MCV: 87.2 fL (ref 78.0–100.0)
Platelets: 158 10*3/uL (ref 150–400)
RBC: 5.08 MIL/uL (ref 4.22–5.81)
RDW: 12.7 % (ref 11.5–15.5)
WBC: 7.1 10*3/uL (ref 4.0–10.5)

## 2012-11-23 LAB — BASIC METABOLIC PANEL
BUN: 17 mg/dL (ref 6–23)
CO2: 28 mEq/L (ref 19–32)
Calcium: 9 mg/dL (ref 8.4–10.5)
Chloride: 101 mEq/L (ref 96–112)
Creatinine, Ser: 1.26 mg/dL (ref 0.50–1.35)
GFR calc Af Amer: 73 mL/min — ABNORMAL LOW (ref >90)
GFR calc non Af Amer: 63 mL/min — ABNORMAL LOW (ref >90)
Glucose, Bld: 79 mg/dL (ref 70–99)
Potassium: 3.8 mEq/L (ref 3.5–5.1)
Sodium: 137 mEq/L (ref 135–145)

## 2012-11-23 LAB — T-HELPER CELLS (CD4) COUNT (NOT AT ARMC)
CD4 % Helper T Cell: 33 % (ref 33–55)
CD4 T Cell Abs: 340 uL — ABNORMAL LOW (ref 400–2700)

## 2012-11-23 MED ORDER — ABACAVIR SULFATE 300 MG PO TABS
600.0000 mg | ORAL_TABLET | Freq: Every day | ORAL | Status: DC
  Administered 2012-11-23: 600 mg via ORAL
  Filled 2012-11-23 (×2): qty 2

## 2012-11-23 MED ORDER — ABACAVIR SULFATE-LAMIVUDINE 600-300 MG PO TABS
1.0000 | ORAL_TABLET | Freq: Every day | ORAL | Status: DC
  Filled 2012-11-23: qty 1

## 2012-11-23 MED ORDER — ABACAVIR SULFATE-LAMIVUDINE 600-300 MG PO TABS: 1.0000 | ORAL_TABLET | Freq: Every day | ORAL | Status: DC

## 2012-11-23 MED ORDER — PRAVASTATIN SODIUM 40 MG PO TABS
40.0000 mg | ORAL_TABLET | Freq: Every day | ORAL | Status: DC
  Filled 2012-11-23: qty 1

## 2012-11-23 MED ORDER — DOXYCYCLINE HYCLATE 100 MG PO TABS: 100.0000 mg | ORAL_TABLET | Freq: Two times a day (BID) | ORAL | Status: DC

## 2012-11-23 MED ORDER — LAMIVUDINE 150 MG PO TABS
300.0000 mg | ORAL_TABLET | Freq: Every day | ORAL | Status: DC
  Administered 2012-11-23: 300 mg via ORAL
  Filled 2012-11-23 (×2): qty 2

## 2012-11-23 MED ORDER — LISINOPRIL 40 MG PO TABS: 40.0000 mg | ORAL_TABLET | Freq: Every day | ORAL | Status: DC

## 2012-11-23 MED ORDER — DARUNAVIR ETHANOLATE 800 MG PO TABS: 800.0000 mg | ORAL_TABLET | Freq: Every day | ORAL | Status: DC

## 2012-11-23 MED ORDER — HYDRALAZINE HCL 20 MG/ML IJ SOLN
INTRAMUSCULAR | Status: AC
  Filled 2012-11-23: qty 1

## 2012-11-23 MED ORDER — AMLODIPINE BESYLATE 10 MG PO TABS: 10.0000 mg | ORAL_TABLET | Freq: Every day | ORAL | Status: DC

## 2012-11-23 MED ORDER — DARUNAVIR ETHANOLATE 800 MG PO TABS
800.0000 mg | ORAL_TABLET | Freq: Every day | ORAL | Status: DC
  Administered 2012-11-23: 800 mg via ORAL
  Filled 2012-11-23 (×3): qty 1

## 2012-11-23 MED ORDER — DOXYCYCLINE HYCLATE 100 MG PO TABS
100.0000 mg | ORAL_TABLET | Freq: Two times a day (BID) | ORAL | Status: DC
  Administered 2012-11-23: 100 mg via ORAL
  Filled 2012-11-23: qty 1

## 2012-11-23 MED ORDER — ASPIRIN 81 MG PO TABS: 81.0000 mg | ORAL_TABLET | Freq: Every day | ORAL | Status: DC

## 2012-11-23 MED ORDER — RITONAVIR 100 MG PO TABS
100.0000 mg | ORAL_TABLET | Freq: Every day | ORAL | Status: DC
  Administered 2012-11-23: 100 mg via ORAL
  Filled 2012-11-23 (×3): qty 1

## 2012-11-23 MED ORDER — PRAVASTATIN SODIUM 40 MG PO TABS: 40.0000 mg | ORAL_TABLET | Freq: Every day | ORAL | Status: DC

## 2012-11-23 MED ORDER — LISINOPRIL 40 MG PO TABS
40.0000 mg | ORAL_TABLET | Freq: Every day | ORAL | Status: DC
  Administered 2012-11-23: 40 mg via ORAL
  Filled 2012-11-23 (×2): qty 1

## 2012-11-23 MED ORDER — RITONAVIR 100 MG PO TABS: 100.0000 mg | ORAL_TABLET | Freq: Every day | ORAL | Status: DC

## 2012-11-23 NOTE — Progress Notes (Signed)
Subjective:    Interval Events:  Patient's only complaint of pain in left foot which is improving.  Discussed reasons for not taking meds in past.  This is attributed, by the patient, solely to the difficulty of swallowing large pills.  When given the choice between swallowing large pills or dying, pt indicates he would rather die.  He reports large pills getting stuck.  No dysphagia or odynophagia with food or liquid.    Objective:    Vital Signs:   Temp:  [98.1 F-99.1 F] 98.2 F (36.8 C) (06/06 0506) Pulse Rate:  [66-83] 83 (06/06 0506) Resp:  [16-18] 18 (06/06 0506) BP: (169-209)/(96-121) 169/96 mmHg (06/06 0506) SpO2:  [98 %-100 %] 100 % (06/06 0506)   Weights: Filed Weights   11/22/12 1315 11/23/12 0506  Weight: 128 lb 9.6 oz (58.333 kg) 123 lb 3.8 oz (55.9 kg)    Intake/Output:   Gross per 24 hour  Intake    413 ml  Output   1050 ml  Net   -637 ml     Last BM Date: 11/21/12   Physical Exam: GENERAL: alert and oriented; resting comfortably in bed and in no distress LUNGS: clear to auscultation bilaterally, normal work of breathing HEART: normal rate; regular rhythm; normal S1 and S2, no S3 or S4 appreciated; no murmurs, rubs, or clicks ABDOMEN: soft, non-tender, normal bowel sounds, no masses palpated EXTREMITIES/SKIN: erythema resolved, tenderness and swelling persists over the distal half of the right foot    Labs: Basic Metabolic Panel: Lab [id number] 11/22/12 1446 11/23/12 0450  NA 138  --  137  K 3.8  --  3.8  CL 104  --  101  CO2 29  --  28  GLUCOSE 122*  --  79  BUN 16  --  17  CREATININE 1.54* 1.64* 1.26  CALCIUM 8.8  --  9.0    CBC: Lab 11/22/12 1050 11/22/12 1446 11/23/12 0450  WBC 6.4 6.8 7.1  NEUTROABS 4.8  --   --   HGB 14.9 13.8 15.3  HCT 42.7 39.8 44.3  MCV 86.4 87.5 87.2  PLT 180 161 158    Microbiology: Results for orders placed in visit on 11/22/12  CULTURE, BLOOD (SINGLE)     Status: None   Collection Time   11/22/12 10:50 AM      Result Value Range Status   Preliminary Report Blood Culture received; No Growth to date;   Preliminary   Preliminary Report Culture will be held for 5 days before issuing   Preliminary   Preliminary Report a Final Negative report.   Preliminary  CULTURE, BLOOD (SINGLE)     Status: None   Collection Time    11/22/12 10:56 AM      Result Value Range Status   Preliminary Report Blood Culture received; No Growth to date;   Preliminary   Preliminary Report Culture will be held for 5 days before issuing   Preliminary   Preliminary Report a Final Negative report.   Preliminary    Other results: Imaging: Dg Foot 2 Views Right 11/22/2012    FINDINGS: The joint spaces are maintained.  No acute bony findings or destructive bony changes. Diffuse forefoot soft tissue swelling is noted.  IMPRESSION: No acute bony findings or destructive bony changes.     Medications:    Infusions:     Scheduled Medications: . amLODipine  10 mg Oral Daily  . aspirin EC  81 mg Oral Daily  .  clindamycin (CLEOCIN) IV  900 mg Intravenous Q8H  . heparin  5,000 Units Subcutaneous Q8H  . simvastatin  5 mg Oral q1800  . sodium chloride  3 mL Intravenous Q12H     PRN Medications: sodium chloride, acetaminophen, hydrALAZINE, sodium chloride    Assessment/ Plan:    1.   Cellulitis:  Reviewed films with radiology.  No evidence of arthropathy.  Arthrocentesis would be difficult and would be very low yield.  We will continue treating with IV clindamycin until esophogram complete.  Patient is responding clinically. - IV clindamycin   2.   Hypertension:  Patient was suppose to be taking amlodipine 10 and lisinopril 40. Amlodipine restarted yesterday. Restarting lisinopril today.  No difficulty swallowing these pills.  3.   HIV:  Patient has not been on ATC since October. We are checking a CD4 count and HIV viral load. We will consult infectious disease for restarting ART.   - ID consult  -  HIV VL pending - CD4 count pending  4.   Subsutance abuse:  Marijuana and tobacco.  - Social work   5.   Medication noncompliance / dysphagia:  Patient stopped taking the medicines because he said the pills were too big and he had a hard time swallowing them. We will make sure he gets on medicines that he will be able to continue taking as an outpatient.  Barium esophogram today to evaluate for strictures or other causes of dysphagia with large pills. - Dg esophogram  6.   Prophylaxis:  Heparin    7.   Disposition:  OPC patient. Observation status.    Length of Stay: 1 days   Signed by:  Shann Medal. Juleen China, MD PGY-I, Internal Medicine Pager [phone]  11/23/2012, 11:17 AM

## 2012-11-23 NOTE — Progress Notes (Signed)
UR completed 

## 2012-11-23 NOTE — Progress Notes (Signed)
TEACHING ATTENDING ADDENDUM: I discussed this case with Dr. Sandy Salaam at the time of the patient visit. I agree with the HPI, exam findings and have read the documentation provided by the resident,  and I concur with the plan of care. Please see the resident note for details of management.

## 2012-11-23 NOTE — Progress Notes (Addendum)
[place] for Infectious Disease    Date of Admission:  11/22/2012   Total days of antibiotics 2        Day 2 clindamycin IV          ID: Peter Cox is a 52 y.o. male with poorly controlled HIv ,off of HAART x 6 months, presents with lower extremitiy erythema c/w cellulitis improved with IV antibiotics Principal Problem:   Cellulitis of foot, right Active Problems:   HIV INFECTION   SUBSTANCE ABUSE, MULTIPLE   HYPERTENSION    Subjective: Foot is feeling better, no fevers, willing to start back on hiv medications  Medications:  . abacavir-lamiVUDine  1 tablet Oral Daily  . amLODipine  10 mg Oral Daily  . aspirin EC  81 mg Oral Daily  . clindamycin (CLEOCIN) IV  900 mg Intravenous Q8H  . darunavir  800 mg Oral Q breakfast  . heparin  5,000 Units Subcutaneous Q8H  . lisinopril  40 mg Oral Daily  . ritonavir  100 mg Oral Q breakfast  . simvastatin  5 mg Oral q1800  . sodium chloride  3 mL Intravenous Q12H    Objective: Vital signs in last 24 hours: Temp:  [98.1 F (36.7 C)-99.1 F (37.3 C)] 98.2 F (36.8 C) (06/06 0506) Pulse Rate:  [66-83] 83 (06/06 0506) Resp:  [16-18] 18 (06/06 0506) BP: (169-209)/(96-121) 169/96 mmHg (06/06 0506) SpO2:  [98 %-100 %] 100 % (06/06 0506) Weight:  [123 lb 3.8 oz (55.9 kg)-128 lb 9.6 oz (58.333 kg)] 123 lb 3.8 oz (55.9 kg) (06/06 0506)  Physical Exam  Constitutional: He is oriented to person, place, and time. He appears well-developed and well-nourished. No distress.  HENT: poor dentition Mouth/Throat: Oropharynx is clear and moist. No oropharyngeal exudate.  Cardiovascular: Normal rate, regular rhythm and normal heart sounds. Exam reveals no gallop and no friction rub.  No murmur heard.  Pulmonary/Chest: Effort normal and breath sounds normal. No respiratory distress. He has no wheezes.  Abdominal: Soft. Bowel sounds are normal. He exhibits no distension. There is no tenderness.  Lymphadenopathy:  He has no cervical  adenopathy.  Neurological: He is alert and oriented to person, place, and time.  Skin: Skin is warm and dry. No rash noted. No erythema.  Psychiatric: He has a normal mood and affect. His behavior is normal.  Ext: right great toe still has erythema to lesser degree than yesterday and swelling to toe and dorsum of foot slightly improved  Lab Results  Recent Labs  11/22/12 1050 11/22/12 1446 11/23/12 0450  WBC 6.4 6.8 7.1  HGB 14.9 13.8 15.3  HCT 42.7 39.8 44.3  NA 138  --  137  K 3.8  --  3.8  CL 104  --  101  CO2 29  --  28  BUN 16  --  17  CREATININE 1.54* 1.64* 1.26   Liver Panel  Recent Labs  11/22/12 1050  PROT 7.2  ALBUMIN 3.3*  AST 11  ALT <8  ALKPHOS 102  BILITOT 0.5    Microbiology: 6/5 blood cx NGTD Studies/Results: Dg Foot 2 Views Right  11/22/2012   *RADIOLOGY REPORT*  Clinical Data: Pain and swelling.  RIGHT FOOT - 2 VIEW  Comparison: None  Findings: The joint spaces are maintained.  No acute bony findings or destructive bony changes. Diffuse forefoot soft tissue swelling is noted.  IMPRESSION: No acute bony findings or destructive bony changes.   Original Report Authenticated By: Marijo Sanes, M.D.  Assessment/Plan: 52yo AAM with HIV, CD 4 count inf the FAll 2013 440, with detectable viral load, hx of non-compliance, poorly controlled HTN, and now presents with cellulitis  Cellulitis= continue with clindamycin, can convert to orals if he has a 2nd day of improvement. Treat for total of 7-10days.  hiv = we will restart his previous regimen on darunavir 800mg  daily/ritonavir 100mg  daily and epzicom. epzicom and darunavir maybe cut/crush to help with swallowing large pills. Reviewed importance of adherence.  Health maintenance =Will check rpr,  and hep b serology as part of maintenance testing for hiv disease. If not hep b immune, will give 1st dose of vaccination while on the inpatient and finish series as an outpatient  htn = managed per primary  team  HLD= will change to different lipid lowering agent since simvastatin has drug interaction with HAART  rtc in 2 wks for adherence check to ID clinic  [facility], [facility] for Infectious Diseases Cell: [phone] Pager: [phone]  11/23/2012, 11:30 AM

## 2012-11-23 NOTE — Progress Notes (Signed)
Paatient discharged to with instructions, and verbalized understanding.

## 2012-11-23 NOTE — H&P (Signed)
Internal Medicine Attending Admission Note Date: 11/23/2012  Patient name: Peter Cox Medical record number: [id number] Date of birth: 03-16-1961 Age: 52 y.o. Gender: male  I saw and evaluated the patient. I reviewed the resident's note and I agree with the resident's findings and plan as documented in the resident's note.  Chief Complaint(s): Foot swelling and pain  History - key components related to admission: Patient is a 52 year old man with hypertension and HIV who has not been taking any medications since October of last year comes in to the clinic with chief complaints of right foot swelling and pain. Pain started 3 days prior to admission and was first reported on the medial aspect of the right first toe. Pain has since progressed and has been associated with swelling of the toe. The swelling has also progressively gotten worse and now involves the entire distal right foot. Patient denies any particular injury to his foot. Pain is described as 10 out of 10 and worse with movement and walking. No relieving factors. Patient denies any fevers or chills at this time. Patient denies any nausea, vomiting, change in weight, change in dietary habits.  The patient feels that swelling and pain is much improved as compared to yesterday.   15 point review of system was negative except for as noted above in the history of present illness.  Past medical history, past surgical history, medications, family history and social history was reviewed and is as per resident's note.   Physical Exam - key components related to admission:  Filed Vitals:   11/22/12 2102 11/23/12 0054 11/23/12 0154 11/23/12 0506  BP: 197/107 197/117 176/97 169/96  Pulse: 70 70 72 83  Temp: 98.1 F (36.7 C)  98.5 F (36.9 C) 98.2 F (36.8 C)  TempSrc: Oral  Oral Oral  Resp: 16  18 18   Height:      Weight:    123 lb 3.8 oz (55.9 kg)  SpO2: 100%  100% 100%  Physical Exam: General: Vital signs reviewed and noted.  Well-developed, well-nourished, in no acute distress; alert, appropriate and cooperative throughout examination.  Head: Normocephalic, atraumatic.  Eyes: PERRL, EOMI, No signs of anemia or jaundince.  Nose: Mucous membranes moist, not inflammed, nonerythematous.  Throat: Oropharynx nonerythematous, no exudate appreciated.   Neck: No deformities, masses, or tenderness noted.Supple, No carotid Bruits, no JVD.  Lungs:  Normal respiratory effort. Clear to auscultation BL without crackles or wheezes.  Heart: RRR. S1 and S2 normal without gallop, murmur, or rubs.  Abdomen:  BS normoactive. Soft, Nondistended, non-tender.  No masses or organomegaly.  Extremities: edema of the right first toe extending over the distal and medial fourth of the foot noted.   Neurologic: A&O X3, CN II - XII are grossly intact. Motor strength is 5/5 in the all 4 extremities, Sensations intact to light touch, Cerebellar signs negative.  Skin: No visible rashes, scars.    Lab results:   Basic Metabolic Panel:  Recent Labs  11/22/12 1050 11/22/12 1446 11/23/12 0450  NA 138  --  137  K 3.8  --  3.8  CL 104  --  101  CO2 29  --  28  GLUCOSE 122*  --  79  BUN 16  --  17  CREATININE 1.54* 1.64* 1.26  CALCIUM 8.8  --  9.0   Liver Function Tests:  Recent Labs  11/22/12 1050  AST 11  ALT <8  ALKPHOS 102  BILITOT 0.5  PROT 7.2  ALBUMIN 3.3*  CBC:  Recent Labs  11/22/12 1050 11/22/12 1446 11/23/12 0450  WBC 6.4 6.8 7.1  NEUTROABS 4.8  --   --   HGB 14.9 13.8 15.3  HCT 42.7 39.8 44.3  MCV 86.4 87.5 87.2  PLT 180 161 158   CBG:  Recent Labs  11/22/12 1102  GLUCAP 117*   Hemoglobin A1C:  Recent Labs  11/22/12 1119  HGBA1C 5.1   Fasting Lipid Panel:  Recent Labs  11/22/12 1050  CHOL 140  HDL 36*  LDLCALC 87  TRIG 87  CHOLHDL 3.9     Imaging results:  Dg Foot 2 Views Right  11/22/2012   *RADIOLOGY REPORT*  Clinical Data: Pain and swelling.  RIGHT FOOT - 2 VIEW  Comparison:  None  Findings: The joint spaces are maintained.  No acute bony findings or destructive bony changes. Diffuse forefoot soft tissue swelling is noted.  IMPRESSION: No acute bony findings or destructive bony changes.   Original Report Authenticated By: Marijo Sanes, M.D.    Other results: EKG: No EKG on admission  Assessment & Plan by Problem:  Principal Problem:   Cellulitis of foot, right Active Problems:   HIV INFECTION   SUBSTANCE ABUSE, MULTIPLE   HYPERTENSION   patient is a 52 year old man with past medical history most significant for uncontrolled HIV, hypertension, substance abuse history and medication noncompliance since October of 2013 comes in with right foot swelling and pain which is most likely cellulitis. Patient was started on IV clindamycin and the pain and swelling is already improved. Patient sees that he has difficulty swallowing pills. The resident has ordered an esophago-gram to evaluate any strictures.  Patient has also been seen and evaluated by infectious disease who had recommended to restart his antiretroviral therapy at this time. Patient has an appointment with Dr. Drucilla Schmidt on July 11 which we are planning to move a little closer for adequate follow up of HIV.  I believe that once the  imaging study of his esophagus is back, we can discharge him home on oral Bactrim for a total of 7-10 days.   Rest of the medical management as per resident note.  Janell Quiet MD Faculty-Internal Medicine Residency Program

## 2012-11-23 NOTE — Discharge Summary (Signed)
Patient Name:  Peter Cox MRN: [id number]  PCP: Ansel Bong, MD DOB:  1961-03-07       Date of Admission:  11/22/2012  Date of Discharge:  11/23/2012      Attending Physician: Janell Quiet, MD         DISCHARGE DIAGNOSES: 1. Cellulitis 2. Hypertension 3. HIV 4. Substance abuse 5. Medication noncompliance 6. Pill dysphasia    DISPOSITION AND FOLLOW-UP: [name] is to follow-up with the listed providers as detailed below, at which time, the following should be addressed:  1. Follow-up visits: 1. CELLULITIS: Discharged with 7 days of doxycycline. Monitor for resolution/improvement. 2. HIV: Restarted on HRT. Has followup with ID. 3. HTN: Restarted on amlodipine and lisinopril. Evaluate for blood pressure control. 4. NONCOMPLIANCE: Patient reported dysphasia with large pills. Barium esophagram normal. Patient passed 13 mm barium tablet without any difficulties. Insure patient compliance with medicines. 5. ID:  HBV vaccines not given in hospital. Followup on HBV surface antibody and vaccinate if needed.  2. Labs and images needed: 1. BMET:  Follow-up creatinine after re-starting ACEi.  3. Pending labs and tests needing follow-up: 1. RPR 2. HBV Core Antibody 3. HBV Surface Antibody 4. HBV Surface Antigen 5. BLOOD CULTURES 6. HIV VIRAL LOAD    Follow-up Information   Follow up with LI, NA, MD On 11/28/2012. (INTERNAL MEDICINE - Your appointment is at 2:45 on Wednesday, June 11.)    Contact information:   [address] [place] [address] [phone]       Follow up with Scharlene Gloss, MD On 12/10/2012. (INFECTIOUS DISEASE - Your appointment is at 2:00 on Monday, June 23.)    Contact information:   [place] [address] [phone]      Discharge Orders   Future Appointments Provider Department Dept Phone   11/28/2012 2:45 PM Charlann Lange, MD [place] [phone]   12/10/2012 2:00 PM Thayer Headings,  MD [facility] for Infectious Disease [phone]   12/27/2012 3:15 PM Truman Hayward, MD [facility] for Infectious Disease [phone]   Future Orders Complete By Expires     Activity as tolerated - No restrictions  As directed     Call MD for:  redness, tenderness, or signs of infection (pain, swelling, redness, odor or green/yellow discharge around incision site)  As directed     Call MD for:  temperature >100.4  As directed     Diet - low sodium heart healthy  As directed         DISCHARGE MEDICATIONS:   Medication List    STOP taking these medications       citalopram 10 MG tablet  Commonly known as:  CELEXA      TAKE these medications       abacavir-lamiVUDine 600-300 MG per tablet  Commonly known as:  [name]  Take 1 tablet by mouth daily. May cut or crush to help with swallowing.     amLODipine 10 MG tablet  Commonly known as:  NORVASC  Take 1 tablet (10 mg total) by mouth daily.     aspirin 81 MG tablet  Take 1 tablet (81 mg total) by mouth daily.     Darunavir Ethanolate 800 MG tablet  Commonly known as:  PREZISTA  Take 1 tablet (800 mg total) by mouth daily. May cut or crush to help with swallowing.  doxycycline 100 MG tablet  Commonly known as:  VIBRA-TABS  Take 1 tablet (100 mg total) by mouth 2 (two) times daily. Take for 7 days.     lisinopril 40 MG tablet  Commonly known as:  PRINIVIL,ZESTRIL  Take 1 tablet (40 mg total) by mouth daily.     pravastatin 40 MG tablet  Commonly known as:  PRAVACHOL  Take 1 tablet (40 mg total) by mouth at bedtime.     ritonavir 100 MG Tabs  Commonly known as:  NORVIR  Take 1 tablet (100 mg total) by mouth daily.         CONSULTS: 1.   ID   PROCEDURES PERFORMED:  Dg Esophagus 11/23/2012   FINDINGS: The mucosa and motility of the esophagus are normal. There is no stricture or mass.  No evidence of esophagitis.  No appreciable hiatal hernia.  A 13 mm barium tablet passed  immediately from the mouth to the stomach with no delay.   IMPRESSION: Normal barium esophagram.   Dg Foot 2 Views Right 11/22/2012   FINDINGS: The joint spaces are maintained.  No acute bony findings or destructive bony changes. Diffuse forefoot soft tissue swelling is noted.   IMPRESSION: No acute bony findings or destructive bony changes.    ADMISSION DATA: H&P: 52 year old with hypertension and HIV presenting to clinic with a swollen right foot that is painful. Pain began 3 days ago at the medial aspect of the right first toe. Swelling of the toe been presented in the last 3 days has spread to include the distal half of the right foot. Patient cannot identify any precipitating injury or break in the skin. Pain does not radiate beyond the distal half of the foot. Movement of the toes and walking exacerbate the pain. Nothing has been tried to alleviate the pain. Not associated with fevers and chills.  Physical Exam: VITALS: BP 200/106, HR 76, temp 99.38F, SpO2 98% on room air  GENERAL: well developed, well nourished; no acute distress  HEAD: atraumatic, normocephalic  EYES: pupils equal, round and reactive; sclera anicteric; normal conjunctiva  EARS: canals patent and TMs normal bilaterally  NOSE/THROAT: oropharynx clear, moist mucous membranes, pink gums, very poor dentition with only a few teeth remaining  NECK: supple, thyroid normal in size and without palpable nodules  LYMPH: scattered lymphadenopathy in the cervical chains bilaterally  LUNGS: clear to auscultation bilaterally, normal work of breathing  HEART: normal rate and regular rhythm; normal S1 and S2 without S3 or S4; no murmurs, rubs, or clicks  PULSES: radial, dorsalis pedis, and posterior tibial pulses are 2+ and symmetric  ABDOMEN: soft, non-tender, normal bowel sounds, no masses or organomegaly  MSK: pain with range of motion exercises with the right toes, right ankle is nontender  MOTOR: 5/5 dorsiflexion and  plantarflexion bilaterally  SENSATION: intact, specifically tested in the feet and toes bilaterally  REFLEXES: 2+ patellar reflexes, symmetric bilaterally  CRANIAL NERVES: pupils reactive to light bilaterally; extra occular muscles are intact; smile is symmetric; uvula is midline and palate elevates symmetrically; tongue protrudes midline.  SKIN: erythema of the right first toe extending over the distal and medial fourth of the foot; area of induration with palpation measuring approximately 3 cm in diameter at the base of the right first phalanx; warm, dry, intact, normal turgor; no rashes  EXTREMITIES: no peripheral edema or cyanosis, clubbing of all 10 fingers  PSYCH: patient is alert and oriented, mood and affect are normal and congruent   Labs: CBG:  11/22/12 1102   GLUCAP  117*    Hemoglobin A1C:   11/22/12 1119   HGBA1C  5.1     HOSPITAL COURSE: 1.   Cellulitis:  Reviewed films with radiology. No evidence of arthropathy. Arthrocentesis would be difficult and would be very low yield. Patient improved clinically on IV clindamycin. Patient remained afebrile and without leukocytosis. Discharged home with a seven-day prescription for doxycycline.  2.   Hypertension:  Patient was suppose to be taking amlodipine 10 and lisinopril 40. Both of these were restarted. Check BMET at followup to monitor creatinine.  3.   HIV:  Patient has not been on ATC since October. CD4 count 340. Viral load pending. Infectious disease restarted ART. They will followup in 2 weeks. RPR and hepatitis B serologies are also pending.  4. Medication noncompliance / dysphagia:  Patient stopped taking the medicines because he said the pills were too big and he had a hard time swallowing them. He has no dysphasia or odynophagia with food or liquids. Barium esophagram was normal, and a 13 mm barium tablet passed immediately into the stomach without any difficulty.  Patient ensures [name] that he will take his medicine  now.   DISCHARGE DATA: Vital Signs: BP 182/111  Pulse 80  Temp(Src) 98.2 F (36.8 C) (Oral)  Resp 16  Ht 5\' 6"  (1.676 m)  Wt 123 lb 3.8 oz (55.9 kg)  BMI 19.9 kg/m2  SpO2 100%  Labs: Basic Metabolic Panel:  Lab  [id number] 1050  11/22/12 1446  11/23/12 0450   NA  138  --  137   K  3.8  --  3.8   CL  104  --  101   CO2  29  --  28   GLUC  122*  --  79   BUN  16  --  17   CR 1.54*  1.64*  1.26   CA  8.8  --  9.0    CBC:  Lab  11/22/12 1050  11/22/12 1446  11/23/12 0450   WBC  6.4  6.8  7.1   HGB  14.9  13.8  15.3   HCT  42.7  39.8  44.3   MCV  86.4  87.5  87.2   PLT  180  161  158    Microbiology:  Results for orders placed in visit on 11/22/12   CULTURE, BLOOD (SINGLE) Status: None    Collection Time    11/22/12 10:50 AM   Result  Value  Range  Status    Preliminary Report  Blood Culture received; No Growth to date;   Preliminary    Preliminary Report  Culture will be held for 5 days before issuing   Preliminary    Preliminary Report  a Final Negative report.   Preliminary   CULTURE, BLOOD (SINGLE) Status: None    Collection Time    11/22/12 10:56 AM   Result  Value  Range  Status    Preliminary Report  Blood Culture received; No Growth to date;   Preliminary    Preliminary Report  Culture will be held for 5 days before issuing   Preliminary    Preliminary Report  a Final Negative report.   Preliminary     Time spent on discharge: 31 minutes   Services Ordered on Discharge: 1. PT - no 2. OT - no 3. RN - no 4. Other - no   Signed by:  Shann Medal. Juleen China, MD PGY-I, Internal Medicine  11/23/2012, 5:19 PM

## 2012-11-24 LAB — RPR: RPR Ser Ql: NONREACTIVE

## 2012-11-24 LAB — HEPATITIS B CORE ANTIBODY, TOTAL: Hep B Core Total Ab: NEGATIVE

## 2012-11-24 LAB — HEPATITIS B SURFACE ANTIGEN: Hepatitis B Surface Ag: NEGATIVE

## 2012-11-27 LAB — HIV-1 GENOTYPR PLUS

## 2012-11-28 ENCOUNTER — Ambulatory Visit (INDEPENDENT_AMBULATORY_CARE_PROVIDER_SITE_OTHER): Payer: Medicare Other | Admitting: Internal Medicine

## 2012-11-28 VITALS — BP 131/80 | HR 100 | Temp 98.2°F | Ht 66.0 in | Wt 127.4 lb

## 2012-11-28 DIAGNOSIS — L03119 Cellulitis of unspecified part of limb: Secondary | ICD-10-CM

## 2012-11-28 DIAGNOSIS — L03115 Cellulitis of right lower limb: Secondary | ICD-10-CM

## 2012-11-28 DIAGNOSIS — L02619 Cutaneous abscess of unspecified foot: Secondary | ICD-10-CM

## 2012-11-28 LAB — CULTURE, BLOOD (SINGLE)
Organism ID, Bacteria: NO GROWTH
Organism ID, Bacteria: NO GROWTH

## 2012-11-28 MED ORDER — AMOXICILLIN 500 MG PO CAPS: 500.0000 mg | ORAL_CAPSULE | Freq: Two times a day (BID) | ORAL | Status: DC

## 2012-11-28 MED ORDER — SULFAMETHOXAZOLE-TRIMETHOPRIM 800-160 MG PO TABS: 1.0000 | ORAL_TABLET | Freq: Two times a day (BID) | ORAL | Status: DC

## 2012-11-28 NOTE — Patient Instructions (Addendum)
  1. Will start you on Amoxicillin 500 mg po twice daily and bactrim DS 1 tab twice daily x 10 days 2.  Please see Dr. Sharol Given at 1015 am tomorrow on 11/29/12.  3. Follow up with me next Tuesday and Wednesday. 4. Come to the clinic or go to ED if you spike fevers or has more foot pain/drainage.

## 2012-11-28 NOTE — Assessment & Plan Note (Addendum)
Right foot swelling is better since hospital discharge per patient and his friend report.  Denies fever and chills. However, he developed bloody pus drainage, and the physical examination reveals fluctuance.   The clinical manifestation is consistent with developing soft tissue abscess around right great toe area.   I think that we can try to manage patient as an outpatient since he does not appear toxic, no fevers and chills since discharge, reports improving right foot swelling w/o any foot pain, and his VSS. Of note, his HIV is under fairly good control with CD4 340 on 11/22/12.  - Will obtain surgical intervention as soon as possible--Dr. Jess Barters office is called and he will see patient at 1015 am tomorrow. - will change his ABX to Amoxicillin and Bactrim for better coverage of strep in addition to staph, MRSA coverage.  Of note, the Clindamycin requires multiple dosage daily which could increase risk for medical noncompliance. And doxycycline is known to have higher resistance to strep A, B, non A and non B.  Comparative susceptibility of clinical group A, B, C, F, and G beta-hemolytic streptococcal isolates to 24 antimicrobial drugs. [facility], Leonhard B. Chemotherapy. 1997 Jan-Feb;43(1):10-20.  - With regards to concern over osteomyelitis, a negative right foot x-ray could not rule out osteomyelitis.  And he may need MRI w/wo contrast at some point.  I would check his BMP since he just had resolving AKI one week ago while he was hospitalized.  I will like Dr. Sharol Given to evaluate him to see if he indeed needs MRI.  - Patient will have close followup with me in 3-4 days. - Patient is instructed to come back to the clinic or go to ED if he spikes a fever or his symptoms are worsening.

## 2012-11-28 NOTE — Progress Notes (Signed)
Subjective:   Patient ID: Peter Cox male   DOB: 1961-05-11 52 y.o.   MRN: [id number]  HPI: [name] is a 52 y.o. man with PMH of hypertension and HIV ( last CD4 340 and a viral load 25548 on 11/22/12), who presents to the clinic for hospital follow up.  He is accompanied by his best friend who visits him daily.  Patient was recently hospitalized for right foot cellulitis between 06/05-06/06 2014.  He never had fever or leukocytosis.  His foot x-ray was negative for bony abnormalities or destructive bony changes.  He improved clinically on IV clindamycin and was discharged home with 7-day prescription for doxycycline.  He reports medical compliance with ABX therapy.   He states that his right foot swelling is slightly better since his discharge, however, he noticed some bloody pus drainage over last couple days.   Denies fever, chills, foot pain.  He is here for evaluation.    Past Medical History  Diagnosis Date  . Hypertension   . Cocaine abuse      clean since about 2000  . Marijuana abuse   . Alcohol abuse     heavy, clean since April  . Tobacco abuse   . Mild cognitive impairment     likely vascular dementia/psa/?HIV componentCT Head 02/12/08 for htn crisis and concern for stroke: impression-1. Old right internal capsule and thalamic lacunar infarcts. 2. Mild to moderate chronic small vessell white matter ischemic changes in both cerebral hemisphere, greater on the right  . HIV infection     dx 08/08/2008 (unknown how he was exposed)  Initial VL 43,200 and CD4 410 on 08/28/08   Current Outpatient Prescriptions  Medication Sig Dispense Refill  . abacavir-lamiVUDine ([name]) 600-300 MG per tablet Take 1 tablet by mouth daily. May cut or crush to help with swallowing.  30 tablet  0  . amLODipine (NORVASC) 10 MG tablet Take 1 tablet (10 mg total) by mouth daily.  30 tablet  0  . amoxicillin (AMOXIL) 500 MG capsule Take 1 capsule (500 mg total) by mouth 2 (two) times daily.   20 capsule  0  . aspirin 81 MG tablet Take 1 tablet (81 mg total) by mouth daily.      . Darunavir Ethanolate (PREZISTA) 800 MG tablet Take 1 tablet (800 mg total) by mouth daily. May cut or crush to help with swallowing.  30 tablet  0  . doxycycline (VIBRA-TABS) 100 MG tablet Take 1 tablet (100 mg total) by mouth 2 (two) times daily. Take for 7 days.  14 tablet  0  . lisinopril (PRINIVIL,ZESTRIL) 40 MG tablet Take 1 tablet (40 mg total) by mouth daily.  30 tablet  0  . pravastatin (PRAVACHOL) 40 MG tablet Take 1 tablet (40 mg total) by mouth at bedtime.  30 tablet  0  . ritonavir (NORVIR) 100 MG TABS Take 1 tablet (100 mg total) by mouth daily.  30 tablet  0  . sulfamethoxazole-trimethoprim (BACTRIM DS) 800-160 MG per tablet Take 1 tablet by mouth 2 (two) times daily.  20 tablet  0   No current facility-administered medications for this visit.   Family History  Problem Relation Age of Onset  . Diabetes Mother   . Hypertension Mother   . Stroke Mother   . Diabetes Father   . Hypertension Sister   . Hypertension Sister    History   Social History  . Marital Status: Single    Spouse Name: N/A  Number of Children: N/A  . Years of Education: N/A   Social History Main Topics  . Smoking status: Current Every Day Smoker -- 0.50 packs/day for 40 years    Types: Cigarettes  . Smokeless tobacco: Never Used     Comment: trying to cut; tried electric cigs.  . Alcohol Use: No     Comment: last drink 2-3 months ago.  . Drug Use: 7.00 per week    Special: Marijuana  . Sexually Active: Yes -- Male partner(s)    Birth Control/ Protection: Condom     Comment: 1 partner, condoms occasionally   Other Topics Concern  . Not on file   Social History Narrative   Lives in [place] with male friend [name]), and her family.  They have been together x 80yrs but are no longer sexually active.  Not working. 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 a grown daughter   Review of  Systems: Review of Systems:  Constitutional:  Denies fever, chills, diaphoresis, appetite change and fatigue.   HEENT:  Denies congestion, sore throat, rhinorrhea, sneezing, mouth sores, trouble swallowing, neck pain   Respiratory:  Denies SOB, DOE, cough, and wheezing.   Cardiovascular:  Denies palpitations and leg swelling.   Gastrointestinal:  Denies nausea, vomiting, abdominal pain, diarrhea, constipation, blood in stool and abdominal distention.   Genitourinary:  Denies dysuria, urgency, frequency, hematuria, flank pain and difficulty urinating.   Musculoskeletal:  Denies myalgias, back pain,  arthralgias and gait problem.  Positive for right foot pain, swelling, bloody pus drainage.    Skin:  Denies pallor, rash and wound.   Neurological:  Denies dizziness, seizures, syncope, weakness, light-headedness, numbness and headaches.    .    Objective:  Physical Exam: Filed Vitals:   11/28/12 1431  BP: 131/80  Pulse: 100  Temp: 98.2 F (36.8 C)  TempSrc: Oral  Height: 5\' 6"  (1.676 m)  Weight: 127 lb 6.4 oz (57.788 kg)  SpO2: 100%   General: alert, well-developed, and cooperative to examination.  Head: normocephalic and atraumatic.  Eyes: vision grossly intact, pupils equal, pupils round, pupils reactive to light, no injection and anicteric.  Mouth: pharynx pink and moist, no erythema, and no exudates.  Neck: supple, full ROM, no thyromegaly, no JVD, and no carotid bruits.  Lungs: normal respiratory effort, no accessory muscle use, normal breath sounds, no crackles, and no wheezes. Heart: normal rate, regular rhythm, no murmur, no gallop, and no rub.  Abdomen: soft, non-tender, normal bowel sounds, no distention, no guarding, no rebound tenderness, no hepatomegaly, and no splenomegaly.  Msk: Erythema, swelling and tenderness to palpation on the right great toe and 1st MTP joint area with fluctuance measuring approximately 2 x 3 cm in diameter at the top of right great toe.    Pulses: 2+ DP/PT pulses bilaterally Extremities: No cyanosis, clubbing, edema Neurologic: alert & oriented X3, cranial nerves II-XII intact, strength normal in all extremities, sensation intact to light touch, and gait normal.  Skin: turgor normal and no rashes.  Psych: Oriented X3, memory intact for recent and remote, normally interactive, good eye contact, not anxious appearing, and not depressed appearing.     Assessment & Plan:

## 2012-11-29 NOTE — Progress Notes (Signed)
Case discussed with Dr. [name] at the time of visit. We reviewed the resident's history and exam and pertinent patient test results. I agree with the assessment, diagnosis and plan of care documented in the resident's note.

## 2012-12-04 ENCOUNTER — Ambulatory Visit: Payer: Medicare Other | Admitting: Internal Medicine

## 2012-12-05 ENCOUNTER — Encounter: Payer: Self-pay | Admitting: Internal Medicine

## 2012-12-05 ENCOUNTER — Ambulatory Visit (INDEPENDENT_AMBULATORY_CARE_PROVIDER_SITE_OTHER): Payer: Medicare Other | Admitting: Internal Medicine

## 2012-12-05 VITALS — BP 120/84 | HR 70 | Temp 97.8°F | Ht 66.0 in | Wt 126.3 lb

## 2012-12-05 DIAGNOSIS — L02611 Cutaneous abscess of right foot: Secondary | ICD-10-CM

## 2012-12-05 DIAGNOSIS — L02619 Cutaneous abscess of unspecified foot: Secondary | ICD-10-CM

## 2012-12-05 DIAGNOSIS — L03119 Cellulitis of unspecified part of limb: Secondary | ICD-10-CM

## 2012-12-05 DIAGNOSIS — L03115 Cellulitis of right lower limb: Secondary | ICD-10-CM

## 2012-12-05 DIAGNOSIS — L03039 Cellulitis of unspecified toe: Secondary | ICD-10-CM

## 2012-12-05 DIAGNOSIS — N259 Disorder resulting from impaired renal tubular function, unspecified: Secondary | ICD-10-CM

## 2012-12-05 LAB — BASIC METABOLIC PANEL WITH GFR
BUN: 20 mg/dL (ref 6–23)
CO2: 28 mEq/L (ref 19–32)
Calcium: 9 mg/dL (ref 8.4–10.5)
Chloride: 104 mEq/L (ref 96–112)
Creat: 2.09 mg/dL — ABNORMAL HIGH (ref 0.50–1.35)
GFR, Est African American: 40 mL/min — ABNORMAL LOW
GFR, Est Non African American: 35 mL/min — ABNORMAL LOW
Glucose, Bld: 84 mg/dL (ref 70–99)
Potassium: 4.3 mEq/L (ref 3.5–5.3)
Sodium: 139 mEq/L (ref 135–145)

## 2012-12-05 MED ORDER — DOXYCYCLINE HYCLATE 100 MG PO TABS: 100.0000 mg | ORAL_TABLET | Freq: Two times a day (BID) | ORAL | Status: DC

## 2012-12-05 MED ORDER — SULFAMETHOXAZOLE-TRIMETHOPRIM 800-160 MG PO TABS: 1.0000 | ORAL_TABLET | Freq: Two times a day (BID) | ORAL | Status: DC

## 2012-12-05 MED ORDER — AMOXICILLIN 500 MG PO CAPS: 500.0000 mg | ORAL_CAPSULE | Freq: Two times a day (BID) | ORAL | Status: DC

## 2012-12-05 NOTE — Progress Notes (Signed)
Subjective:   Patient ID: Peter Cox male   DOB: 1960-07-13 52 y.o.   MRN: [id number]  HPI: Peter Cox is a 52 y.o. man with PMH of hypertension and HIV ( last CD4 340 and a viral load 25548 on 11/22/12), who presents to the clinic for followup since last office visit.  Patient was recently hospitalized for right foot cellulitis between 06/05-06/06 2014.  He never had fever or leukocytosis.  His foot x-ray was negative for bony abnormalities or destructive bony changes.  He improved clinically on IV clindamycin and was discharged home with 7-day prescription for doxycycline.  He was evaluated at the clinic on 11/28/12 and was noted to have palpable abscess over the dorsum of MTP joints of right great toe. He was given 10-day course of ABX including Amoxicillin and Bactrim, and was referred to Dr. Sharol Given.  Dr. Sharol Given I&D his abscess, and he was given Dial soap cleasing with antibiotic ointment. He is here for follow up.  He reports that his right toe infection is better. No foot pain/discomfort or drainage. Denies fever or chills. He reports medical compliance with his ABX treatment.     Past Medical History  Diagnosis Date  . Hypertension   . Cocaine abuse      clean since about 2000  . Marijuana abuse   . Alcohol abuse     heavy, clean since April  . Tobacco abuse   . Mild cognitive impairment     likely vascular dementia/psa/?HIV componentCT Head 02/12/08 for htn crisis and concern for stroke: impression-1. Old right internal capsule and thalamic lacunar infarcts. 2. Mild to moderate chronic small vessell white matter ischemic changes in both cerebral hemisphere, greater on the right  . HIV infection     dx 08/08/2008 (unknown how he was exposed)  Initial VL 43,200 and CD4 410 on 08/28/08   Current Outpatient Prescriptions  Medication Sig Dispense Refill  . abacavir-lamiVUDine ([name]) 600-300 MG per tablet Take 1 tablet by mouth daily. May cut or crush to help with swallowing.  30  tablet  0  . amLODipine (NORVASC) 10 MG tablet Take 1 tablet (10 mg total) by mouth daily.  30 tablet  0  . amoxicillin (AMOXIL) 500 MG capsule Take 1 capsule (500 mg total) by mouth 2 (two) times daily.  20 capsule  0  . aspirin 81 MG tablet Take 1 tablet (81 mg total) by mouth daily.      . Darunavir Ethanolate (PREZISTA) 800 MG tablet Take 1 tablet (800 mg total) by mouth daily. May cut or crush to help with swallowing.  30 tablet  0  . doxycycline (VIBRA-TABS) 100 MG tablet Take 1 tablet (100 mg total) by mouth 2 (two) times daily. Take for 7 days.  14 tablet  0  . lisinopril (PRINIVIL,ZESTRIL) 40 MG tablet Take 1 tablet (40 mg total) by mouth daily.  30 tablet  0  . pravastatin (PRAVACHOL) 40 MG tablet Take 1 tablet (40 mg total) by mouth at bedtime.  30 tablet  0  . ritonavir (NORVIR) 100 MG TABS Take 1 tablet (100 mg total) by mouth daily.  30 tablet  0   No current facility-administered medications for this visit.   Family History  Problem Relation Age of Onset  . Diabetes Mother   . Hypertension Mother   . Stroke Mother   . Diabetes Father   . Hypertension Sister   . Hypertension Sister    History   Social History  .  Marital Status: Single    Spouse Name: N/A    Number of Children: N/A  . Years of Education: N/A   Social History Main Topics  . Smoking status: Former Smoker -- 0.50 packs/day for 40 years    Types: Cigarettes  . Smokeless tobacco: Never Used     Comment: Quit last week.  . Alcohol Use: No     Comment: last drink 2-3 months ago.  . Drug Use: 7.00 per week    Special: Marijuana  . Sexually Active: None   Other Topics Concern  . None   Social History Narrative   Lives in [place] with male friend [name]), and her family.  They have been together x 35yrs but are no longer sexually active.  Not working. 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   HEENT:  Denies congestion, sore throat, rhinorrhea, sneezing, mouth sores, trouble swallowing, neck pain   Respiratory:  Denies SOB, DOE, cough, and wheezing.   Cardiovascular:  Denies palpitations and leg swelling.   Gastrointestinal:  Denies nausea, vomiting, abdominal pain, diarrhea, constipation, blood in stool and abdominal distention.   Genitourinary:  Denies dysuria, urgency, frequency, hematuria, flank pain and difficulty urinating.   Musculoskeletal:  Denies myalgias, back pain,  arthralgias and gait problem.  Positive for right foot swelling.  Skin:  Denies pallor, rash and wound.   Neurological:  Denies dizziness, seizures, syncope, weakness, light-headedness, numbness and headaches.    .    Objective:  Physical Exam: Filed Vitals:   12/05/12 0822  BP: 120/84  Pulse: 70  Temp: 97.8 F (36.6 C)  TempSrc: Oral  Height: 5\' 6"  (1.676 m)  Weight: 126 lb 4.8 oz (57.289 kg)  SpO2: 100%   General: alert, well-developed, and cooperative to examination.  Head: normocephalic and atraumatic.  Eyes: vision grossly intact, pupils equal, pupils round, pupils reactive to light, no injection and anicteric.  Mouth: pharynx pink and moist, no erythema, and no exudates.  Neck: supple, full ROM, no thyromegaly, no JVD, and no carotid bruits.  Lungs: normal respiratory effort, no accessory muscle use, normal breath sounds, no crackles, and no wheezes. Heart: normal rate, regular rhythm, no murmur, no gallop, and no rub.  Abdomen: soft, non-tender, normal bowel sounds, no distention, no guarding, no rebound tenderness, no hepatomegaly, and no splenomegaly.  Msk: mild Erythema, swelling and tenderness to palpation on the dorsum of right 1st MTP joint area with fluctuance measuring approximately 1 x 2 cm in diameter. Pulses: 2+ DP/PT pulses bilaterally Extremities: No cyanosis, clubbing, edema Neurologic: alert & oriented X3, cranial nerves  II-XII intact, strength normal in all extremities, sensation intact to light touch, and gait normal.  Skin: turgor normal and no rashes.  Psych: Oriented X3, memory intact for recent and remote, normally interactive, good eye contact, not anxious appearing, and not depressed appearing.     Assessment & Plan:

## 2012-12-05 NOTE — Assessment & Plan Note (Addendum)
Patient improved after I&D by Dr. Sharol Given on 11/29/12 and reports compliance with ABX treatment. Physical exam indicates small abscess on dorsum of right MTP joint.  Of note, I am concerned that patient may have early dementia likely associated with HIV.  Thus, I called his caregiver/best friend with his permission.  - Dr. Jess Barters office is contacted and He will see him at 1145 am tomorrow. - Dr. Jess Barters office called for wound culture workup if I&D again.   - BMP today--Cr 2.09 - will stop amoxicillin and Bactrim as both can affect the kidney function. Patient has been encouraged to increase oral fluid intake.  - will ask him to drink plenty of fluid and stop Lisinopril. His caregiver states that she will remove all of his oxacillin, Bactrim and lisinopril out of his pillbox.  - For a good coverage for Strep, staphy and MRSA, we could use clindamycin.  However, I am unsure whether he can be compliant with clindamycin since he lives alone and has possible early dementia.  I talked to his caregiver/best friend with patient's permission, and she told me that he couldn't take TID medication. - I will try doxycycline 100 mg by mouth twice a day for now, and check his UA and BMP in 2 days (Friday 12/07/12).  With good I&D tomorrow, his infection may be controlled by Doxycycline alone. If his infection worsens and/or his AKI persists or becomes worse on Friday, will consider hospital admission.  - plan to check MRI to rule out OM once his kidney function is stabilized.

## 2012-12-05 NOTE — Patient Instructions (Addendum)
  1. Drink plenty of fluids.  Stop lisinopril, amoxicillin and Bactrim. 2. start Doxycycline po twice daily x 10 days. 3. check your BMP and UA on Friday 12/07/12   2. Follow up with me on Monday 12/10/12 3. You have an appt with Dr. Sharol Given at 1145 tomorrow.

## 2012-12-05 NOTE — Addendum Note (Signed)
Addended byNicoletta Dress, [name] Aguinaga on: 12/05/2012 11:51 AM   Modules accepted: Orders

## 2012-12-07 ENCOUNTER — Other Ambulatory Visit (INDEPENDENT_AMBULATORY_CARE_PROVIDER_SITE_OTHER): Payer: Medicare Other

## 2012-12-07 ENCOUNTER — Inpatient Hospital Stay (HOSPITAL_COMMUNITY)
Admission: AD | Admit: 2012-12-07 | Discharge: 2012-12-09 | DRG: 682 | Disposition: A | Discharge status: Home / Self Care | Payer: Medicare Other | Source: Ambulatory Visit | Attending: Internal Medicine | Admitting: Internal Medicine

## 2012-12-07 ENCOUNTER — Other Ambulatory Visit: Payer: Self-pay | Admitting: Internal Medicine

## 2012-12-07 ENCOUNTER — Encounter (HOSPITAL_COMMUNITY): Payer: Self-pay | Admitting: Surgery

## 2012-12-07 ENCOUNTER — Inpatient Hospital Stay (HOSPITAL_COMMUNITY): Payer: Medicare Other

## 2012-12-07 DIAGNOSIS — Z87891 Personal history of nicotine dependence: Secondary | ICD-10-CM

## 2012-12-07 DIAGNOSIS — I10 Essential (primary) hypertension: Secondary | ICD-10-CM

## 2012-12-07 DIAGNOSIS — L02619 Cutaneous abscess of unspecified foot: Secondary | ICD-10-CM | POA: Diagnosis present

## 2012-12-07 DIAGNOSIS — L03039 Cellulitis of unspecified toe: Secondary | ICD-10-CM | POA: Diagnosis present

## 2012-12-07 DIAGNOSIS — Z79899 Other long term (current) drug therapy: Secondary | ICD-10-CM

## 2012-12-07 DIAGNOSIS — N179 Acute kidney failure, unspecified: Principal | ICD-10-CM

## 2012-12-07 DIAGNOSIS — N189 Chronic kidney disease, unspecified: Secondary | ICD-10-CM

## 2012-12-07 DIAGNOSIS — L02611 Cutaneous abscess of right foot: Secondary | ICD-10-CM | POA: Diagnosis present

## 2012-12-07 DIAGNOSIS — N183 Chronic kidney disease, stage 3 unspecified: Secondary | ICD-10-CM | POA: Diagnosis present

## 2012-12-07 DIAGNOSIS — B20 Human immunodeficiency virus [HIV] disease: Secondary | ICD-10-CM

## 2012-12-07 DIAGNOSIS — I129 Hypertensive chronic kidney disease with stage 1 through stage 4 chronic kidney disease, or unspecified chronic kidney disease: Secondary | ICD-10-CM | POA: Diagnosis present

## 2012-12-07 LAB — CREATININE, URINE, RANDOM: Creatinine, Urine: 76.32 mg/dL

## 2012-12-07 LAB — BASIC METABOLIC PANEL WITH GFR
BUN: 22 mg/dL (ref 6–23)
CO2: 26 mEq/L (ref 19–32)
Calcium: 8.9 mg/dL (ref 8.4–10.5)
Chloride: 104 mEq/L (ref 96–112)
Creat: 2.12 mg/dL — ABNORMAL HIGH (ref 0.50–1.35)
GFR, Est African American: 40 mL/min — ABNORMAL LOW
GFR, Est Non African American: 34 mL/min — ABNORMAL LOW
Glucose, Bld: 88 mg/dL (ref 70–99)
Potassium: 4.5 mEq/L (ref 3.5–5.3)
Sodium: 137 mEq/L (ref 135–145)

## 2012-12-07 LAB — URINALYSIS, ROUTINE W REFLEX MICROSCOPIC
Glucose, UA: NEGATIVE mg/dL
Hgb urine dipstick: NEGATIVE
Leukocytes, UA: NEGATIVE
Nitrite: NEGATIVE
Protein, ur: 100 mg/dL — AB
Specific Gravity, Urine: 1.025 (ref 1.005–1.030)
Urobilinogen, UA: 0.2 mg/dL (ref 0.0–1.0)
pH: 5.5 (ref 5.0–8.0)

## 2012-12-07 LAB — URINALYSIS, MICROSCOPIC ONLY

## 2012-12-07 LAB — SODIUM, URINE, RANDOM: Sodium, Ur: 178 mEq/L

## 2012-12-07 MED ORDER — AMLODIPINE BESYLATE 10 MG PO TABS
10.0000 mg | ORAL_TABLET | Freq: Every day | ORAL | Status: DC
  Administered 2012-12-07 – 2012-12-09 (×3): 10 mg via ORAL
  Filled 2012-12-07 (×3): qty 1

## 2012-12-07 MED ORDER — ACETAMINOPHEN 325 MG PO TABS: 650.0000 mg | ORAL_TABLET | Freq: Four times a day (QID) | ORAL | Status: DC | PRN

## 2012-12-07 MED ORDER — ASPIRIN EC 81 MG PO TBEC: 81.0000 mg | DELAYED_RELEASE_TABLET | Freq: Every day | ORAL | Status: DC

## 2012-12-07 MED ORDER — SODIUM CHLORIDE 0.9 % IV SOLN
INTRAVENOUS | Status: DC
  Administered 2012-12-07 – 2012-12-08 (×2): via INTRAVENOUS
  Administered 2012-12-08: 100 mL/h via INTRAVENOUS

## 2012-12-07 MED ORDER — HEPARIN SODIUM (PORCINE) 5000 UNIT/ML IJ SOLN
5000.0000 [IU] | Freq: Three times a day (TID) | INTRAMUSCULAR | Status: DC
  Administered 2012-12-07 – 2012-12-09 (×4): 5000 [IU] via SUBCUTANEOUS
  Filled 2012-12-07 (×7): qty 1

## 2012-12-07 MED ORDER — ASPIRIN EC 81 MG PO TBEC
81.0000 mg | DELAYED_RELEASE_TABLET | Freq: Every day | ORAL | Status: DC
  Administered 2012-12-07 – 2012-12-09 (×3): 81 mg via ORAL
  Filled 2012-12-07 (×4): qty 1

## 2012-12-07 MED ORDER — ACETAMINOPHEN 650 MG RE SUPP: 650.0000 mg | Freq: Four times a day (QID) | RECTAL | Status: DC | PRN

## 2012-12-07 NOTE — Progress Notes (Signed)
Case discussed with Dr. [name] soon after the resident saw the patient.  We reviewed the resident's history and exam and pertinent patient test results.  I agree with the assessment, diagnosis and plan of care documented in the resident's note.  I agree with Dr. August Saucer assessment.  Peter Cox has worsening renal function, likely due to the antibiotics.  He will need further debridement of his abscess, which has been arranged.  Further Abx will be difficult to manage given his worsening renal function.  He will need very close follow up and possibly admission for AKI/ATN.

## 2012-12-07 NOTE — H&P (Signed)
Date: 12/07/2012               Patient Name:  Peter Cox MRN: [id number]  DOB: [birth year] Age / Sex: 52 y.o., male   PCP: Ansel Bong, MD         Medical Service: Internal Medicine Teaching Service         Attending Physician: Dr. Madilyn Fireman, MD    First Contact: Dr. Eula Fried  Pager: [phone]  Second Contact: Dr. Doug Sou   Pager: [phone]       After Hours (After 5p/  First Contact Pager: [phone]  weekends / holidays): Second Contact Pager: [phone]   Chief Complaint: Right foot abscess  History of Present Illness:  Peter Cox is a 52 year old man with PMH of HTN, and HIV (last CD4 340 and a viral load 25548 on 11/22/12) who presented to the [facility] today for [name] visit for right foot cellulitis. He was discharged from the hospital on June 5th after receiving IV clindamycin with clinical improvement of his cellulitis. He was discharged home with prescription for a 7-day course of doxycycline. On 6/11 he was evaluated at the [facility] with palpable abscess of his right MTP joint and was given a 10-day course of amoxicillin and Bactrim and was referred to Orthopedic Surgery. Dr. Sharol Given performed I&D of his abscess and he was given a cleansing soap and antibiotic ointment. He reports not foot pain, discomfort, drainage, or swelling. His creatinine however, has increased to 2.12 and he will be admitted for management of acute AKI.    He denies headache, fever/chills, chest pain, shortness of breath, abdominal pain, dysuria, diarrhea, or decreased intake per mouth.    Meds: Medications Prior to Admission  Medication Sig Dispense Refill  . abacavir-lamiVUDine ([name]) 600-300 MG per tablet Take 1 tablet by mouth daily. May cut or crush to help with swallowing.  30 tablet  0  . amLODipine (NORVASC) 10 MG tablet Take 1 tablet (10 mg total) by mouth daily.  30 tablet  0  . aspirin 81 MG tablet Take 1 tablet (81 mg total) by mouth daily.      . Darunavir Ethanolate (PREZISTA) 800 MG tablet Take 1  tablet (800 mg total) by mouth daily. May cut or crush to help with swallowing.  30 tablet  0  . pravastatin (PRAVACHOL) 40 MG tablet Take 1 tablet (40 mg total) by mouth at bedtime.  30 tablet  0  . ritonavir (NORVIR) 100 MG TABS Take 1 tablet (100 mg total) by mouth daily.  30 tablet  0  . [DISCONTINUED] doxycycline (VIBRA-TABS) 100 MG tablet Take 1 tablet (100 mg total) by mouth 2 (two) times daily.  20 tablet  0  . [DISCONTINUED] lisinopril (PRINIVIL,ZESTRIL) 40 MG tablet Take 1 tablet (40 mg total) by mouth daily.  30 tablet  0    Allergies: Allergies as of 12/07/2012  . (No Known Allergies)   Past Medical History  Diagnosis Date  . Hypertension   . Cocaine abuse      clean since about 2000  . Marijuana abuse   . Alcohol abuse     heavy, clean since April  . Tobacco abuse   . Mild cognitive impairment     likely vascular dementia/psa/?HIV componentCT Head 02/12/08 for htn crisis and concern for stroke: impression-1. Old right internal capsule and thalamic lacunar infarcts. 2. Mild to moderate chronic small vessell white matter ischemic changes in both cerebral hemisphere, greater on the right  .  HIV infection     dx 08/08/2008 (unknown how he was exposed)  Initial VL 43,200 and CD4 410 on 08/28/08   Past Surgical History  Procedure Laterality Date  . Hemorrhoid surgery    . Closed reduction patellar      right knee  . Shoulder arthroscopy  01/2008    w/extensive debridement (Dr Mardelle Matte)   Family History  Problem Relation Age of Onset  . Diabetes Mother   . Hypertension Mother   . Stroke Mother   . Diabetes Father   . Hypertension Sister   . Hypertension Sister    History   Social History  . Marital Status: Single    Spouse Name: N/A    Number of Children: N/A  . Years of Education: N/A   Occupational History  . Not on file.   Social History Main Topics  . Smoking status: Former Smoker -- 0.50 packs/day for 40 years    Types: Cigarettes  . Smokeless tobacco:  Never Used     Comment: Quit last week.  . Alcohol Use: No     Comment: last drink 2-3 months ago.  . Drug Use: 7.00 per week    Special: Marijuana  . Sexually Active: Not on file   Other Topics Concern  . Not on file   Social History Narrative   Lives in [place] with male friend Peter Cox), and her family.  They have been together x 60yrs but are no longer sexually active.  Not working. 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  Physical Exam: Blood pressure 126/68, pulse 57, temperature 98.3 F (36.8 C), resp. rate 18, height 5\' 6"  (1.676 m), weight 125 lb 12.8 oz (57.063 kg), SpO2 100.00%. Vitals reviewed. General: resting in bed, in NAD HEENT: PERRL, EOMI, no scleral icterus, moist mucus membranes Cardiac: RRR, no rubs, murmurs or gallops Pulm: clear to auscultation bilaterally, no wheezes, rales, or rhonchi, no respiratory distress Abd: soft, nontender, nondistended, BS present Ext: warm and well perfused, no pedal edema. Pulses 2+ DP/PT, bilaterally MSK: mild erythema, edema with no tenderness over the Right dorsal MTP joint with fluctuance of ~0.5x0.5cm in diameter.  Neuro: alert and oriented X3, cranial nerves II-XII grossly intact, strength and sensation to light touch equal in bilateral upper and lower extremities   Lab results: Basic Metabolic Panel:  Recent Labs  12/05/12 0859 12/07/12 1042  NA 139 137  K 4.3 4.5  CL 104 104  CO2 28 26  GLUCOSE 84 88  BUN 20 22  CREATININE 2.09* 2.12*  CALCIUM 9.0 8.9   Urine Drug Screen: Drugs of Abuse     Component Value Date/Time   LABOPIA NONE DETECTED 06/05/2011 1121   COCAINSCRNUR NONE DETECTED 06/05/2011 1121   COCAINSCRNUR NEG 06/24/2008 2035   LABBENZ NONE DETECTED 06/05/2011 1121   LABBENZ NEG 06/24/2008 2035   AMPHETMU NONE DETECTED 06/05/2011 1121   AMPHETMU NEG 06/24/2008 2035   THCU POSITIVE* 06/05/2011 1121    LABBARB NONE DETECTED 06/05/2011 1121    Urinalysis:  Recent Labs  12/07/12 1042  COLORURINE AMBER*  LABSPEC 1.025  PHURINE 5.5  GLUCOSEU NEG  HGBUR NEG  BILIRUBINUR SMALL*  KETONESUR TRACE*  PROTEINUR 100*  UROBILINOGEN 0.2  NITRITE NEG  LEUKOCYTESUR NEG    Assessment & Plan by Problem:  Acute on chronic renal failure with CKD stage 3. His baseline creatinine has ranged  between 1.2-1.5 in the past 6 months and it is elevated to 2.12 on presentation. This is likely secondary to recent antibiotic use, including Bactrim which can increase creatinine secretion transiently. Of note, lamivudine, part of his antiretroviral regimen may also affect renal function/  -Admit to Med Surg -Renal US bilaterally -Hold antiretroviral for tonight, will call ID in AM for recommendation of renally safe HIV meds -urine sodium, urine Cr -Hold lisinopril -IVF, NS 12ml/hr -Strict I&O  Hypertension - He is on lisinopril at home as well as Norvasc.  -Hold lisinopril in setting of AKI  HIV infection - Last CD4 340 and a viral load 25548 on 11/22/12. He restarted his antiretroviral's two weeks ago. Lamivudine can be nephrotoxic.  -Hold antiretroviral's for tonight, will call ID in AM for recommendations   Right foot abscess - Minimum fluctuance, no tenderness, edema, or erythema. Followed by Dr. Sharol Given with recent I&D. The wound does not appear infected at this time.  -Continue monitoring  DVT prophylaxis: heparin TID   Dispo: Disposition is deferred at this time, awaiting improvement of current medical problems. Anticipated discharge in approximately 1-2 day(s).   The patient does have a current PCP Ansel Bong, MD) and does need an [facility] hospital follow-up appointment after discharge.  The patient does have transportation limitations that hinder transportation to clinic appointments.  Signed: Blain Pais, MD 12/07/2012, 7:14 PM

## 2012-12-08 ENCOUNTER — Inpatient Hospital Stay (HOSPITAL_COMMUNITY): Payer: Medicare Other

## 2012-12-08 DIAGNOSIS — B999 Unspecified infectious disease: Secondary | ICD-10-CM

## 2012-12-08 DIAGNOSIS — Z21 Asymptomatic human immunodeficiency virus [HIV] infection status: Secondary | ICD-10-CM

## 2012-12-08 DIAGNOSIS — I10 Essential (primary) hypertension: Secondary | ICD-10-CM

## 2012-12-08 DIAGNOSIS — N179 Acute kidney failure, unspecified: Principal | ICD-10-CM

## 2012-12-08 LAB — BASIC METABOLIC PANEL
BUN: 23 mg/dL (ref 6–23)
CO2: 23 mEq/L (ref 19–32)
Calcium: 8.8 mg/dL (ref 8.4–10.5)
Chloride: 102 mEq/L (ref 96–112)
Creatinine, Ser: 1.7 mg/dL — ABNORMAL HIGH (ref 0.50–1.35)
GFR calc Af Amer: 51 mL/min — ABNORMAL LOW (ref >90)
GFR calc non Af Amer: 44 mL/min — ABNORMAL LOW (ref >90)
Glucose, Bld: 77 mg/dL (ref 70–99)
Potassium: 4.2 mEq/L (ref 3.5–5.1)
Sodium: 135 mEq/L (ref 135–145)

## 2012-12-08 LAB — CBC
HCT: 39.4 % (ref 39.0–52.0)
Hemoglobin: 13.5 g/dL (ref 13.0–17.0)
MCH: 29.6 pg (ref 26.0–34.0)
MCHC: 34.3 g/dL (ref 30.0–36.0)
MCV: 86.4 fL (ref 78.0–100.0)
Platelets: 214 10*3/uL (ref 150–400)
RBC: 4.56 MIL/uL (ref 4.22–5.81)
RDW: 12.9 % (ref 11.5–15.5)
WBC: 7 10*3/uL (ref 4.0–10.5)

## 2012-12-08 MED ORDER — ENSURE COMPLETE PO LIQD
237.0000 mL | Freq: Two times a day (BID) | ORAL | Status: DC
  Administered 2012-12-08 – 2012-12-09 (×2): 237 mL via ORAL

## 2012-12-08 NOTE — H&P (Addendum)
Internal Medicine Teaching Service Attending Note Date: 12/08/2012  Patient name: Peter Cox  Medical record number: [id number]  Date of birth: June 28, 1960  I have reviewed Dr. Bernadene Bell notes and I agree with her exam and findings with these additional thoughts.   In short, [name] is a 52 year old african [place] gentleman with HIV on HAART, with recent right foot cellulitis which was treated with Bactrim and Amoxicillin and drained. While his wound is getting better, on his clinic visit, his creatinine was found to be acutely risen from his baseline of 1.2-1.5 range to 2.12. He was admitted for acute renal failure. Hospital course has been benign so far. He has been hydrated with > 2l of normal saline and his creatinine has improved to 1.7. He does not have any symptoms, and he complains of no pain anywhere in his body. Vitals reviewed - stable. I have examined him and aside from the dressing on his right big toe, exam findings are unremarkable for any abnormalites. We did an abdominal ultrasound for assessing obstruction, and review of that indicates that there is no evidence of hydronephrosis.There is suspicion of 5 mm nonobstructing calculus mid right kidney. There is a questionable area in the kidney, 4.4 cm in diameter, which raises suspicion of a mass. We will pursue this with a non-contrast MRI. We will continue current treatment, follow BMP, and await MRI results. Likely discharge tomorrow.   Rest of the chronic issues per Dr. Bernadene Bell note and Dr. Donneta Romberg note from today.   [place], [place] 12/08/2012, 1:26 PM.

## 2012-12-08 NOTE — Consult Note (Addendum)
INFECTIOUS DISEASE CONSULT NOTE  Date of Admission:  12/07/2012  Date of Consult:  12/08/2012  Reason for Consult: HIV+, ARF Referring Physician: Doug Cox  Impression/Recommendation HIV+ ARF R foot infection HTN  Would- Hold his anti-retrovirals F/u ID clinic within the week of d/c If his foot has any suspicion of infection, would check MRI.  His BP is much better controlled.   Comment: Consider changing his therapy to [name]/integrase inhibitor at f/u if his CrCl improves (currently ~40). [name] cannot be Cox with a CrCl <50 so would wait to restart until he has f/u and hopefully his Cr has improved. The etiology of his increased Cr is ? Could be due to Bactrim, he denies use of NSAIDS, has been linked to ritonivir historically (although he has been on this for ~ 2 years).     Thank you so much for this interesting consult,  My great appreciation to the excellent care of the IM teaching service.  Peter Cox [email or web address]  Peter Cox is an 52 y.o. male.  HPI: 52 yo M with HIV+ (Dx 2010 started on atripla, developed rash, then changed to DRVr/TRV, then had increased Cr, then changed to [place]).   HIV 1 RNA Quant (copies/mL)  Date Value  11/22/2012 25548*  03/05/2012 25336*  10/26/2011 <20      CD4 T Cell Abs (cmm)  Date Value  11/22/2012 340*  03/05/2012 440   10/26/2011 770    He was in [facility] 6-5 with swelling and erythema of his R foot. He had been off his ART at that point (prior was on EPZ/DRVr). He was restarted on his ART and his cellulitis was improved, he was d/c home on 11-23-12. He was seen in f/u 6-11 in [facility] and was noted to have d/c from his R foot wound. He was seen by Dr Peter Cox 6-12 and had I & D of this. He was treated with amoxil and bactrim. He had IM f/u on 6-18 and was noted to have increase in his Cr to 2.09. His anbx were stopped as was his ACE-I. He was changed to doxy.  He returned to [facility] on 6-20 with worsening pain and swelling  in his foot. He was found at that time to be afebrile, BP 200/106, and to have Cr 2.12. He was started on IV clindamycin which appear to have  been d/c. His ART has been placed on hold.   Past Medical History  Diagnosis Date  . Hypertension   . Cocaine abuse      clean since about 2000  . Marijuana abuse   . Alcohol abuse     heavy, clean since April  . Tobacco abuse   . Mild cognitive impairment     likely vascular dementia/psa/?HIV componentCT Head 02/12/08 for htn crisis and concern for stroke: impression-1. Old right internal capsule and thalamic lacunar infarcts. 2. Mild to moderate chronic small vessell white matter ischemic changes in both cerebral hemisphere, greater on the right  . HIV infection     dx 08/08/2008 (unknown how he was exposed)  Initial VL 43,200 and CD4 410 on 08/28/08    Past Surgical History  Procedure Laterality Date  . Hemorrhoid surgery    . Closed reduction patellar      right knee  . Shoulder arthroscopy  01/2008    w/extensive debridement (Dr Mardelle Matte)     No Known Allergies  Medications:  Scheduled: . amLODipine  10 mg Oral Daily  . aspirin EC  81  mg Oral Daily  . feeding supplement  237 mL Oral BID BM  . heparin  5,000 Units Subcutaneous Q8H    Total days of antibiotics: off          Social History:  reports that he has quit smoking. His smoking use included Cigarettes. He has a 20 pack-year smoking history. He has never used smokeless tobacco. He reports that he uses illicit drugs (Marijuana) about 7 times per week. He reports that he does not drink alcohol.  Family History  Problem Relation Age of Onset  . Diabetes Mother   . Hypertension Mother   . Stroke Mother   . Diabetes Father   . Hypertension Sister   . Hypertension Sister     General ROS: negative for - change in wt, difficulty eating, change in appetite, dysuria, d/c from his R foot wound, see HPI.   Blood pressure 137/89, pulse 68, temperature 97.9 F (36.6 C),  temperature source Oral, resp. rate 20, height 5\' 6"  (1.676 m), weight 57.607 kg (127 lb), SpO2 100.00%. General appearance: alert, cooperative and no distress Eyes: negative findings: pupils equal, round, reactive to light and accomodation Throat: normal findings: oropharynx pink & moist without lesions or evidence of thrush Neck: no adenopathy and supple, symmetrical, trachea midline Lungs: clear to auscultation bilaterally Heart: regular rate and rhythm Abdomen: normal findings: bowel sounds normal and soft, non-tender Extremities: edema none and R great toe is well healed- there is no fluctuance, no os, non-tender.    Results for orders placed during the hospital encounter of 12/07/12 (from the past 48 hour(s))  SODIUM, URINE, RANDOM     Status: None   Collection Time    12/07/12  8:17 PM      Result Value Range   Sodium, Ur 178    CREATININE, URINE, RANDOM     Status: None   Collection Time    12/07/12  8:17 PM      Result Value Range   Creatinine, Urine 123XX123    BASIC METABOLIC PANEL     Status: Abnormal   Collection Time    12/08/12  5:24 AM      Result Value Range   Sodium 135  135 - 145 mEq/L   Potassium 4.2  3.5 - 5.1 mEq/L   Chloride 102  96 - 112 mEq/L   CO2 23  19 - 32 mEq/L   Glucose, Bld 77  70 - 99 mg/dL   BUN 23  6 - 23 mg/dL   Creatinine, Ser 1.70 (*) 0.50 - 1.35 mg/dL   Calcium 8.8  8.4 - 10.5 mg/dL   GFR calc non Af Amer 44 (*) >90 mL/min   GFR calc Af Amer 51 (*) >90 mL/min   Comment:            The eGFR has been calculated     using the CKD EPI equation.     This calculation has not been     validated in all clinical     situations.     eGFR's persistently     <90 mL/min signify     possible Chronic Kidney Disease.  CBC     Status: None   Collection Time    12/08/12  5:24 AM      Result Value Range   WBC 7.0  4.0 - 10.5 K/uL   RBC 4.56  4.22 - 5.81 MIL/uL   Hemoglobin 13.5  13.0 - 17.0 g/dL   HCT 39.4  39.0 - 52.0 %   MCV 86.4  78.0 - 100.0  fL   MCH 29.6  26.0 - 34.0 pg   MCHC 34.3  30.0 - 36.0 g/dL   RDW 12.9  11.5 - 15.5 %   Platelets 214  150 - 400 K/uL   No results found for this basename: sdes, specrequest, cult, reptstatus   US Renal  12/07/2012   *RADIOLOGY REPORT*  Clinical Data: Hypertension, HIV, elevated creatinine, question hydronephrosis  RENAL/URINARY TRACT ULTRASOUND COMPLETE  Comparison:  08/09/2008  Findings:  Right Kidney:  9.5 cm length.  Normal cortical thickness.  Upper normal cortical echogenicity.  A 5 mm echogenic focus mid right kidney could represent a non obstructing calculus.  No definite mass or hydronephrosis.  Left Kidney:  9.4 cm length.  Normal cortical echogenicity.  Area of cortical prominence at the mid left kidney approximately 4.4 cm in diameter may represent a prominent column of Bertin but an isoechoic mass is not excluded.  No hydronephrosis shadowing calcification.  Bladder:  Normal appearance, bilateral ureteral jets noted.  IMPRESSION: No evidence of hydronephrosis. Suspect 5 mm nonobstructing calculus mid right kidney. Question prominent cortex or column of Bertin at the mid left kidney though an isoechoic mass is not completely excluded, area in question 4.4 cm diameter; noncontrast MR imaging recommended to exclude renal mass lesion.   Original Report Authenticated By: Lavonia Dana, M.D.   No results found for this or any previous visit (from the past 240 hour(s)).    12/08/2012, 5:18 PM     LOS: 1 day

## 2012-12-08 NOTE — Progress Notes (Signed)
Subjective: The patient is doing well this morning and is not having any complaints. His foot is doing well and is not hurting him. He states that it has healed well. His longtime friend is also in the room and adds that he is not eating well at home. He did eat about half of his breakfast this morning. He is not having any blood in his urine and no dysuria, frequency, urgency. He is not having any chest pain or abdominal pain. He is not having nausea or vomiting. He has no complaints and told him about the area in his kidney which we would like to get a better look at. His friend states that occasionally he will use weed but has recently stopped smoking cigarettes.  Objective: Vital signs in last 24 hours: Filed Vitals:   12/07/12 1700 12/07/12 2121 12/08/12 0524  BP: 126/68 130/75 138/79  Pulse: 57 64 75  Temp: 98.3 F (36.8 C) 98.5 F (36.9 C) 98.3 F (36.8 C)  TempSrc:  Oral Oral  Resp: 18 18 18   Height: 5\' 6"  (1.676 m)    Weight: 125 lb 12.8 oz (57.063 kg)  127 lb (57.607 kg)  SpO2: 100% 100% 100%   Weight change:   Intake/Output Summary (Last 24 hours) at 12/08/12 0835 Last data filed at 12/08/12 0606  Gross per 24 hour  Intake   1283 ml  Output   1500 ml  Net   -217 ml   Physical Exam: General: resting in bed, in NAD, pleasant HEENT: PERRL, EOMI, no scleral icterus, moist mucus membranes  Cardiac: RRR, no rubs, murmurs or gallops  Pulm: clear to auscultation bilaterally, no wheezes, rales, or rhonchi, no respiratory distress  Abd: soft, nontender, nondistended, BS present  Ext: warm and well perfused, no pedal edema. Pulses 2+ DP/PT, bilaterally  MSK: mild erythema, edema with no tenderness over the Right dorsal MTP joint with fluctuance of ~0.5x0.5cm in diameter. No active signs of infection. Neuro: alert and oriented X3, cranial nerves II-XII grossly intact, strength and sensation to light touch equal in bilateral upper and lower extremities   Lab Results: Basic  Metabolic Panel:  Recent Labs Lab 12/07/12 1042 12/08/12 0524  NA 137 135  K 4.5 4.2  CL 104 102  CO2 26 23  GLUCOSE 88 77  BUN 22 23  CREATININE 2.12* 1.70*  CALCIUM 8.9 8.8   CBC:  Recent Labs Lab 12/08/12 0524  WBC 7.0  HGB 13.5  HCT 39.4  MCV 86.4  PLT 214   Urinalysis:  Recent Labs Lab 12/07/12 1042  COLORURINE AMBER*  LABSPEC 1.025  PHURINE 5.5  GLUCOSEU NEG  HGBUR NEG  BILIRUBINUR SMALL*  KETONESUR TRACE*  PROTEINUR 100*  UROBILINOGEN 0.2  NITRITE NEG  LEUKOCYTESUR NEG   Studies/Results: US Renal  12/07/2012   *RADIOLOGY REPORT*  Clinical Data: Hypertension, HIV, elevated creatinine, question hydronephrosis  RENAL/URINARY TRACT ULTRASOUND COMPLETE  Comparison:  08/09/2008  Findings:  Right Kidney:  9.5 cm length.  Normal cortical thickness.  Upper normal cortical echogenicity.  A 5 mm echogenic focus mid right kidney could represent a non obstructing calculus.  No definite mass or hydronephrosis.  Left Kidney:  9.4 cm length.  Normal cortical echogenicity.  Area of cortical prominence at the mid left kidney approximately 4.4 cm in diameter may represent a prominent column of Bertin but an isoechoic mass is not excluded.  No hydronephrosis shadowing calcification.  Bladder:  Normal appearance, bilateral ureteral jets noted.  IMPRESSION: No evidence  of hydronephrosis. Suspect 5 mm nonobstructing calculus mid right kidney. Question prominent cortex or column of Bertin at the mid left kidney though an isoechoic mass is not completely excluded, area in question 4.4 cm diameter; noncontrast MR imaging recommended to exclude renal mass lesion.   Original Report Authenticated By: Lavonia Dana, M.D.   Medications: I have reviewed the patient's current medications. Scheduled Meds: . amLODipine  10 mg Oral Daily  . aspirin EC  81 mg Oral Daily  . heparin  5,000 Units Subcutaneous Q8H   Continuous Infusions: . sodium chloride 100 mL/hr (12/08/12 0606)   PRN  Meds:.acetaminophen, acetaminophen Assessment/Plan: Acute on chronic renal failure - Cr elevated to 2.12 from baseline 1.2-1.5 on admission. Getting better today and Cr 1.7. FeNa was >3% which would indicate that it may be ATN or from recent courses of antibiotics. Will get input from ID to see if his antiretrovirals may be playing a role (especially lamivudine) and if any changes to regimen need to occur. There may be a small component of pre-renal etiology as well given his poor PO intake at home. Renal US showed area of mass that should be evaluated with MR -Nutrition consult -Continue IVF one more day -MR abdomen without contrast to evaluate possible renal mass  Abscess of great toe of right foot - Seems to be resolving and not acutely infected or requiring antibiotics.  -Observe closely  HYPERTENSION - Will continue home regimen while in the hospital. BPs 130s/80s today. -Continue amlodipine  HIV disease - Last CD4 340 on 11/22/12 with viral load of 29562 and was off medications prior to that. He recently restarted abacavir and lamivudine at home.  -Will get ID input regarding regimen and possible nephrotoxicity    DVT ppx - heparin 5000 units TID [place]  Dispo: Disposition is deferred at this time, awaiting improvement of current medical problems.  Anticipated discharge in approximately 1-2 day(s).   The patient does have a current PCP Ansel Bong, MD) and does need an [facility] hospital follow-up appointment after discharge.  The patient does have transportation limitations that hinder transportation to clinic appointments.  .Services Needed at time of discharge: Y = Yes, Blank = No PT:   OT:   RN:   Equipment:   Other:     LOS: 1 day   Olga Millers, MD 12/08/2012, 8:35 AM

## 2012-12-08 NOTE — Progress Notes (Signed)
INITIAL NUTRITION ASSESSMENT  DOCUMENTATION CODES Per approved criteria  -Not Applicable   INTERVENTION:  Ensure Complete twice daily (350 kcals, 13 gm protein per 8 fl oz bottle) RD to follow for nutrition care plan  NUTRITION DIAGNOSIS: Increased nutrient needs related to catabolic illness, HIV as evidenced by estimated nutrition needs  Goal: Oral intake with meals & supplements to meet >/= 90% of estimated nutrition needs  Monitor:  PO & supplemental intake, weight, labs, I/O's  Reason for Assessment: Malnutrition Screening Tool Report  52 y.o. male  Admitting Dx: Abscess of great toe of right foot  ASSESSMENT: Patient with PMH of HIV presented to the [facility] today for HFU visit for right foot cellulitis; on 6/11 he was evaluated at the [facility] with palpable abscess of his right MTP joint and was given a 10-day course of ABX; creatinine has increased to 2.12 and he was admitted for management of acute AKI.   Patient spoke to patient and patient's longtime friend; reports patient's appetite is pretty good, however, he has lost some weight; unable to quantify amount; PO intake 50% per flowsheet records; would benefit from nutrition supplements; amenable to Ensure ---> RD to order.  Height: Ht Readings from Last 1 Encounters:  12/07/12 5\' 6"  (1.676 m)    Weight: Wt Readings from Last 1 Encounters:  12/08/12 127 lb (57.607 kg)    Ideal Body Weight: 142 lb  % Ideal Body Weight: 89%  Wt Readings from Last 10 Encounters:  12/08/12 127 lb (57.607 kg)  12/05/12 126 lb 4.8 oz (57.289 kg)  11/28/12 127 lb 6.4 oz (57.788 kg)  11/23/12 123 lb 3.8 oz (55.9 kg)  11/22/12 128 lb 9.6 oz (58.333 kg)  05/23/12 131 lb 6.4 oz (59.603 kg)  05/08/12 126 lb 8 oz (57.38 kg)  03/05/12 127 lb (57.607 kg)  11/02/11 127 lb 8 oz (57.834 kg)  08/29/11 131 lb (59.421 kg)    Usual Body Weight: 131 lb  % Usual Body Weight: 96%  BMI:  Body mass index is 20.51 kg/(m^2).  Estimated Nutritional  Needs: Kcal: 1700-1900 Protein: 80-90 gm Fluid: 1.7-1.9 L  Skin: abscess of great toe  Diet Order: Renal 60/70-07-22-1198 ml  EDUCATION NEEDS: -No education needs identified at this time   Intake/Output Summary (Last 24 hours) at 12/08/12 1543 Last data filed at 12/08/12 1422  Gross per 24 hour  Intake   1403 ml  Output   2100 ml  Net   -697 ml    Labs:   Recent Labs Lab 12/05/12 0859 12/07/12 1042 12/08/12 0524  NA 139 137 135  K 4.3 4.5 4.2  CL 104 104 102  CO2 28 26 23   BUN 20 22 23   CREATININE 2.09* 2.12* 1.70*  CALCIUM 9.0 8.9 8.8  GLUCOSE 84 88 77    Scheduled Meds: . amLODipine  10 mg Oral Daily  . aspirin EC  81 mg Oral Daily  . heparin  5,000 Units Subcutaneous Q8H    Continuous Infusions: . sodium chloride 100 mL/hr (12/08/12 0606)    Past Medical History  Diagnosis Date  . Hypertension   . Cocaine abuse      clean since about 2000  . Marijuana abuse   . Alcohol abuse     heavy, clean since April  . Tobacco abuse   . Mild cognitive impairment     likely vascular dementia/psa/?HIV componentCT Head 02/12/08 for htn crisis and concern for stroke: impression-1. Old right internal capsule and thalamic lacunar infarcts.  2. Mild to moderate chronic small vessell white matter ischemic changes in both cerebral hemisphere, greater on the right  . HIV infection     dx 08/08/2008 (unknown how he was exposed)  Initial VL 43,200 and CD4 410 on 08/28/08    Past Surgical History  Procedure Laterality Date  . Hemorrhoid surgery    . Closed reduction patellar      right knee  . Shoulder arthroscopy  01/2008    w/extensive debridement (Dr Mardelle Matte)    Arthur Holms, RD, LDN Pager #: [phone] After-Hours Pager #: [phone]

## 2012-12-09 LAB — BASIC METABOLIC PANEL
BUN: 23 mg/dL (ref 6–23)
CO2: 22 mEq/L (ref 19–32)
Calcium: 9.1 mg/dL (ref 8.4–10.5)
Chloride: 103 mEq/L (ref 96–112)
Creatinine, Ser: 1.29 mg/dL (ref 0.50–1.35)
GFR calc Af Amer: 71 mL/min — ABNORMAL LOW (ref >90)
GFR calc non Af Amer: 61 mL/min — ABNORMAL LOW (ref >90)
Glucose, Bld: 83 mg/dL (ref 70–99)
Potassium: 4.5 mEq/L (ref 3.5–5.1)
Sodium: 135 mEq/L (ref 135–145)

## 2012-12-09 LAB — CBC
HCT: 40.3 % (ref 39.0–52.0)
Hemoglobin: 13.8 g/dL (ref 13.0–17.0)
MCH: 29.4 pg (ref 26.0–34.0)
MCHC: 34.2 g/dL (ref 30.0–36.0)
MCV: 85.9 fL (ref 78.0–100.0)
Platelets: 213 10*3/uL (ref 150–400)
RBC: 4.69 MIL/uL (ref 4.22–5.81)
RDW: 12.9 % (ref 11.5–15.5)
WBC: 5.7 10*3/uL (ref 4.0–10.5)

## 2012-12-09 NOTE — Progress Notes (Signed)
Subjective: Mr. Waymack was seen and examined at bedside this morning.  He has no complaints and wishes to go home as soon as possible. His foot has dry bandage in place on right great toe, no pain.  He denies nausea or vomiting, hematuria or dysuria, frequency, urgency and has no chest pain, shortness of breath or abdominal pain.   Objective: Vital signs in last 24 hours: Filed Vitals:   12/08/12 0956 12/08/12 1418 12/08/12 2155 12/09/12 0605  BP: 131/89 137/89 134/74 120/78  Pulse: 65 68 66 66  Temp: 98.2 F (36.8 C) 97.9 F (36.6 C) 97.6 F (36.4 C) 97.8 F (36.6 C)  TempSrc: Oral  Oral   Resp: 18 20 19 18   Height:      Weight:    126 lb 11.8 oz (57.489 kg)  SpO2: 100% 100% 100% 100%   Weight change: 15 oz (0.426 kg)  Intake/Output Summary (Last 24 hours) at 12/09/12 1009 Last data filed at 12/09/12 [id number]  Gross per 24 hour  Intake    480 ml  Output   1700 ml  Net  -1220 ml   Physical Exam: General: resting in bed, in NAD, thin HEENT: PERRL, EOMI Cardiac: RRR, no rubs, murmurs or gallops  Pulm: clear to auscultation bilaterally, no wheezes, rales, or rhonchi, no respiratory distress  Abd: soft, nontender, nondistended, BS present  Ext: warm and well perfused, no pedal edema. Pulses 2+ DP/PT, bilaterally MSK: mild erythema and edema with no tenderness over the Right dorsal MTP joint, dry bandage in place Neuro: alert and oriented X3, cranial nerves II-XII grossly intact, strength and sensation to light touch equal in bilateral upper and lower extremities  Lab Results: Basic Metabolic Panel:  Recent Labs Lab 12/08/12 0524 12/09/12 0729  NA 135 135  K 4.2 4.5  CL 102 103  CO2 23 22  GLUCOSE 77 83  BUN 23 23  CREATININE 1.70* 1.29  CALCIUM 8.8 9.1   CBC:  Recent Labs Lab 12/08/12 0524 12/09/12 0450  WBC 7.0 5.7  HGB 13.5 13.8  HCT 39.4 40.3  MCV 86.4 85.9  PLT 214 213   Urinalysis:  Recent Labs Lab 12/07/12 1042  COLORURINE AMBER*  LABSPEC  1.025  PHURINE 5.5  GLUCOSEU NEG  HGBUR NEG  BILIRUBINUR SMALL*  KETONESUR TRACE*  PROTEINUR 100*  UROBILINOGEN 0.2  NITRITE NEG  LEUKOCYTESUR NEG   Studies/Results: Mr Abdomen Wo Contrast  12/09/2012   *RADIOLOGY REPORT*  Clinical Data: HIV, hypertension, abnormal renal ultrasound with concern for a left renal mass.  MRI ABDOMEN WITHOUT CONTRAST  Technique:  Multiplanar multisequence MR imaging of the abdomen was performed. No intravenous contrast was administered.  Comparison: Ultrasound 12/07/2012  Findings:  Renal:  There is a lobular contour to the midportion of the left kidney.  This is isointense to the normal liver parenchyma on all the pulse sequences and likely represents normal junctional tissue or column of Bertin. Areas does not restricted diffusion.  No IV contrast was administered.  No evidence of hydronephrosis.  The right kidney is normal.  There is no focal hepatic lesion.  No evidence of biliary dilatation.  The pancreas, spleen, and adrenal glands are normal. The stomach and limited view of the bowel are unremarkable.  IMPRESSION:  Lobular contour of the left kidney is felt to represent a normal variation.   Original Report Authenticated By: Suzy Bouchard, M.D.   US Renal  12/07/2012   *RADIOLOGY REPORT*  Clinical Data: Hypertension, HIV, elevated creatinine,  question hydronephrosis  RENAL/URINARY TRACT ULTRASOUND COMPLETE  Comparison:  08/09/2008  Findings:  Right Kidney:  9.5 cm length.  Normal cortical thickness.  Upper normal cortical echogenicity.  A 5 mm echogenic focus mid right kidney could represent a non obstructing calculus.  No definite mass or hydronephrosis.  Left Kidney:  9.4 cm length.  Normal cortical echogenicity.  Area of cortical prominence at the mid left kidney approximately 4.4 cm in diameter may represent a prominent column of Bertin but an isoechoic mass is not excluded.  No hydronephrosis shadowing calcification.  Bladder:  Normal appearance, bilateral  ureteral jets noted.  IMPRESSION: No evidence of hydronephrosis. Suspect 5 mm nonobstructing calculus mid right kidney. Question prominent cortex or column of Bertin at the mid left kidney though an isoechoic mass is not completely excluded, area in question 4.4 cm diameter; noncontrast MR imaging recommended to exclude renal mass lesion.   Original Report Authenticated By: Lavonia Dana, M.D.   Medications: I have reviewed the patient's current medications. Scheduled Meds: . amLODipine  10 mg Oral Daily  . aspirin EC  81 mg Oral Daily  . feeding supplement  237 mL Oral BID BM  . heparin  5,000 Units Subcutaneous Q8H   Continuous Infusions: . sodium chloride 100 mL/hr at 12/08/12 1848   PRN Meds:.acetaminophen, acetaminophen Assessment/Plan:  Mr. Goetzinger is a 52 year old man with PMH of HTN, R foot cellulitis, RMTP joint abscess s/p I&D, and HIV (last CD4 340 and a viral load 25548 on 11/22/12) admitted for AKI.  Acute on chronic renal failure--Resolved, Cr down to 1.29 this morning.  Cr elevated to 2.12 on admission from baseline 1.2-1.5 on admission.  FeNa was >3% which would indicate that it may be ATN or from recent courses of antibiotics. ID was consulted, Dr. Johnnye Sima.  Cr rise could be secondary to Bactrim or retrovirals.  He denies NSAID use.  Renal US showed area of mass that should be evaluated with MR -appreciate Nutrition and ID following -continue to hold anti-retovirals at this time  ?Renal mass on ultrasound--12/07/12: no evidence of hydronephrosis, 85mm non-obstructing calculus mid right kidney, question prominent cortex or column of Bertin at mid left kidney--?ishoechoic mass, 4.4cm in diameter; recommend non contrast MR to exclude renal mass.  Follow up MRI abdomen without contrast 6/21: lobular contour of L kidney felt to represent normal variation, likely representing normal junctional tissue or column of Bertin.  Normal R kidney.   Abscess of great toe of right foot--treated as an  outpatient with 10 day course of amoxicillin and Bactrim and followed by orthopedic surgery, Dr. Sharol Given.  S/p I&D by Dr. Sharol Given.  Seems to be resolving and not acutely infected or requiring antibiotics. Afebrile.   -continue to monitor  HYPERTENSION--home regimen includes: Lisinopril 40mg  qd and Norvasc 10mg  qd.  BP on admission 126/68.   -holding Lisinopril, resume per PCP upon discharge -continue norvasc home dose  HIV disease - Last CD4 count 340 on 11/22/12 with viral load of [address] and was off medications prior to that. He recently restarted abacavir and lamivudine at home.  -appreciate ID following, will need to follow up as an outpatient in ID clinic within a week of discharge.   -continue to hold anti-retroviral at this time -may consider changing his regimen to [name]/integrase inhibitor at follow up with CrCl improves (needs to be >50) per ID  Diet: Renal with ensure complete BID DVT ppx - heparin 5000 units TID [place]  Dispo: d/c home today  The patient does have a current PCP Ansel Bong, MD) and does need an [facility] hospital follow-up appointment after discharge.  The patient does have transportation limitations that hinder transportation to clinic appointments.  Services Needed at time of discharge: Y = Yes, Blank = No PT:   OT:   RN:   Equipment:   Other:     LOS: 2 days   Jerene Pitch, MD 12/09/2012, 10:09 AM

## 2012-12-09 NOTE — Progress Notes (Signed)
   CARE MANAGEMENT NOTE 12/09/2012  Patient:  BREVEN, BRAATEN   Account Number:  [id number]  Date Initiated:  12/09/2012  Documentation initiated by:  [facility]  Subjective/Objective Assessment:   Abscess of great toe of right foot, HTN, ARF     Action/Plan:   Anticipated DC Date:  12/09/2012   Anticipated DC Plan:  [place]  CM consult  Medication Assistance      Choice offered to / List presented to:             Status of service:  Completed, signed off Medicare Important Message given?   (If response is "NO", the following Medicare IM given date fields will be blank) Date Medicare IM given:   Date Additional Medicare IM given:    Discharge Disposition:  HOME/SELF CARE  Per UR Regulation:    If discussed at Long Length of Stay Meetings, dates discussed:    Comments:  12/09/2012 12:26 PM  NCM spoke to pt and states he does have insurance for his medications. His copay runs about $30 and he gets antivirals for free. He gets his medications from [facility]. Explained he can shop for the best price at the discount pharmacies such as [facility], [facility], or [facility]. Pt states he will check around for best price. Jonnie Finner RN CCM Case Mgmt phone [phone]

## 2012-12-09 NOTE — Progress Notes (Signed)
NCM spoke to pt and states he does have insurance for his medications. His copay runs about $30 and he gets antivirals for free. He gets his medications from [facility]. Explained he can shop for the best price at the discount pharmacies such as [facility], [facility], or [facility]. Pt states he will check around for best price. Jonnie Finner RN CCM Case Mgmt phone [phone]

## 2012-12-09 NOTE — Progress Notes (Signed)
INFECTIOUS DISEASE PROGRESS NOTE  ID: Peter Cox is a 52 y.o. male with  Principal Problem:   Abscess of great toe of right foot Active Problems:   HYPERTENSION   HIV disease   Acute on chronic renal failure  Subjective: Without complaints  Abtx:  Anti-infectives   None      Medications:  Scheduled: . amLODipine  10 mg Oral Daily  . aspirin EC  81 mg Oral Daily  . feeding supplement  237 mL Oral BID BM  . heparin  5,000 Units Subcutaneous Q8H    Objective: Vital signs in last 24 hours: Temp:  [97.6 F (36.4 C)-97.9 F (36.6 C)] 97.9 F (36.6 C) (06/22 1028) Pulse Rate:  [66-68] 67 (06/22 1028) Resp:  [18-20] 18 (06/22 1028) BP: (120-137)/(74-89) 133/83 mmHg (06/22 1028) SpO2:  [100 %] 100 % (06/22 1028) Weight:  [57.489 kg (126 lb 11.8 oz)] 57.489 kg (126 lb 11.8 oz) (06/22 0605)   General appearance: alert, cooperative and no distress Resp: clear to auscultation bilaterally Cardio: regular rate and rhythm GI: normal findings: bowel sounds normal and soft, non-tender Extremities: no d/c from his R great toe. no tenderness. no fluctuance.   Lab Results  Recent Labs  12/08/12 0524 12/09/12 0450 12/09/12 0729  WBC 7.0 5.7  --   HGB 13.5 13.8  --   HCT 39.4 40.3  --   NA 135  --  135  K 4.2  --  4.5  CL 102  --  103  CO2 23  --  22  BUN 23  --  23  CREATININE 1.70*  --  1.29   Liver Panel No results found for this basename: PROT, ALBUMIN, AST, ALT, ALKPHOS, BILITOT, BILIDIR, IBILI,  in the last 72 hours Sedimentation Rate No results found for this basename: ESRSEDRATE,  in the last 72 hours C-Reactive Protein No results found for this basename: CRP,  in the last 72 hours  Microbiology: No results found for this or any previous visit (from the past 240 hour(s)).  Studies/Results: Mr Abdomen Wo Contrast  12/09/2012   *RADIOLOGY REPORT*  Clinical Data: HIV, hypertension, abnormal renal ultrasound with concern for a left renal mass.  MRI  ABDOMEN WITHOUT CONTRAST  Technique:  Multiplanar multisequence MR imaging of the abdomen was performed. No intravenous contrast was administered.  Comparison: Ultrasound 12/07/2012  Findings:  Renal:  There is a lobular contour to the midportion of the left kidney.  This is isointense to the normal liver parenchyma on all the pulse sequences and likely represents normal junctional tissue or column of Bertin. Areas does not restricted diffusion.  No IV contrast was administered.  No evidence of hydronephrosis.  The right kidney is normal.  There is no focal hepatic lesion.  No evidence of biliary dilatation.  The pancreas, spleen, and adrenal glands are normal. The stomach and limited view of the bowel are unremarkable.  IMPRESSION:  Lobular contour of the left kidney is felt to represent a normal variation.   Original Report Authenticated By: Suzy Bouchard, M.D.   US Renal  12/07/2012   *RADIOLOGY REPORT*  Clinical Data: Hypertension, HIV, elevated creatinine, question hydronephrosis  RENAL/URINARY TRACT ULTRASOUND COMPLETE  Comparison:  08/09/2008  Findings:  Right Kidney:  9.5 cm length.  Normal cortical thickness.  Upper normal cortical echogenicity.  A 5 mm echogenic focus mid right kidney could represent a non obstructing calculus.  No definite mass or hydronephrosis.  Left Kidney:  9.4 cm length.  Normal cortical echogenicity.  Area of cortical prominence at the mid left kidney approximately 4.4 cm in diameter may represent a prominent column of Bertin but an isoechoic mass is not excluded.  No hydronephrosis shadowing calcification.  Bladder:  Normal appearance, bilateral ureteral jets noted.  IMPRESSION: No evidence of hydronephrosis. Suspect 5 mm nonobstructing calculus mid right kidney. Question prominent cortex or column of Bertin at the mid left kidney though an isoechoic mass is not completely excluded, area in question 4.4 cm diameter; noncontrast MR imaging recommended to exclude renal mass  lesion.   Original Report Authenticated By: Lavonia Dana, M.D.     Assessment/Plan: HIV+ ARF R foot infection HTN  Plan for d/c today. F/u ID clinic this week.  Consider restart his ART at f/u (EPZ/INI?) My great appreciation to the IMTS  [name] Infectious Diseases [email or web address] 12/09/2012, 11:36 AM   LOS: 2 days

## 2012-12-09 NOTE — Discharge Summary (Signed)
Name: Peter Cox MRN: [id number] DOB: 10-Mar-1961 52 y.o. PCP: Peter Bong, MD  Date of Admission: 12/07/2012  4:41 PM Date of Discharge: 12/09/2012 Attending Physician: Peter Fireman, MD  Discharge Diagnosis: Principal Problem:   Abscess of great toe of right foot Active Problems:   HYPERTENSION   HIV disease   Acute on chronic renal failure  Discharge Medications:   Medication List    STOP taking these medications       abacavir-lamiVUDine 600-300 MG per tablet  Commonly known as:  [name]     Darunavir Ethanolate 800 MG tablet  Commonly known as:  PREZISTA     doxycycline 100 MG tablet  Commonly known as:  VIBRA-TABS     lisinopril 40 MG tablet  Commonly known as:  PRINIVIL,ZESTRIL     ritonavir 100 MG Tabs  Commonly known as:  NORVIR      TAKE these medications       amLODipine 10 MG tablet  Commonly known as:  NORVASC  Take 1 tablet (10 mg total) by mouth daily.     aspirin 81 MG tablet  Take 1 tablet (81 mg total) by mouth daily.     pravastatin 40 MG tablet  Commonly known as:  PRAVACHOL  Take 1 tablet (40 mg total) by mouth at bedtime.        Disposition and follow-up:   Peter Cox was discharged from [facility] in Stable condition.  At the hospital follow up visit please address:  1.  If he can restart his HIV regimen.  2.  Labs / imaging needed at time of follow-up: none  3.  Pending labs/ test needing follow-up: none  Follow-up Appointments:     Follow-up Information   [facility]   Contact information:   [id number] [place] [address] [phone]      Follow up with [facility] for Infectious Disease. (You will need to come in this week to see a doctor at the infectious disease clinic.)    Contact information:   [id number] [place] [address] [phone]      Discharge  Instructions:  Future Appointments Provider Department Dept Phone   12/10/2012 2:00 PM Peter Headings, MD [facility] for Infectious Disease [phone]   12/10/2012 4:00 PM Peter Lange, MD [place] [phone]   12/27/2012 3:15 PM Peter Hayward, MD [facility] for Infectious Disease [phone]      Consultations:  ID  Procedures Performed:  Peter Cox Wo Contrast  12/09/2012   *RADIOLOGY REPORT*  Clinical Data: HIV, hypertension, abnormal renal ultrasound with concern for a left renal mass.  MRI Cox WITHOUT CONTRAST  Technique:  Multiplanar multisequence Peter imaging of the Cox was performed. No intravenous contrast was administered.  Comparison: Ultrasound 12/07/2012  Findings:  Renal:  There is a lobular contour to the midportion of the left kidney.  This is isointense to the normal liver parenchyma on all the pulse sequences and likely represents normal junctional tissue or column of Bertin. Areas does not restricted diffusion.  No IV contrast was administered.  No evidence of hydronephrosis.  The right kidney is normal.  There is no focal hepatic lesion.  No evidence of biliary dilatation.  The pancreas, spleen, and adrenal glands are normal. The stomach and limited view of the bowel are unremarkable.  IMPRESSION:  Lobular contour of the left kidney is felt to represent a normal variation.   Original Report Authenticated By: Peter Cox, M.D.   US Renal  12/07/2012   *RADIOLOGY REPORT*  Clinical Data: Hypertension, HIV, elevated creatinine, question hydronephrosis  RENAL/URINARY TRACT ULTRASOUND COMPLETE  Comparison:  08/09/2008  Findings:  Right Kidney:  9.5 cm length.  Normal cortical thickness.  Upper normal cortical echogenicity.  A 5 mm echogenic focus mid right kidney could represent a non obstructing calculus.  No definite mass or hydronephrosis.  Left Kidney:  9.4 cm length.  Normal cortical echogenicity.  Area of  cortical prominence at the mid left kidney approximately 4.4 cm in diameter may represent a prominent column of Bertin but an isoechoic mass is not excluded.  No hydronephrosis shadowing calcification.  Bladder:  Normal appearance, bilateral ureteral jets noted.  IMPRESSION: No evidence of hydronephrosis. Suspect 5 mm nonobstructing calculus mid right kidney. Question prominent cortex or column of Bertin at the mid left kidney though an isoechoic mass is not completely excluded, area in question 4.4 cm diameter; noncontrast Peter imaging recommended to exclude renal mass lesion.   Original Report Authenticated By: Lavonia Dana, M.D.   Admission HPI:  Peter. Kowal is a 52 year old man with PMH of HTN, and HIV (last CD4 340 and a viral load 25548 on 11/22/12) who presented to the [facility] today for HFU visit for right foot cellulitis. He was discharged from the hospital on June 5th after receiving IV clindamycin with clinical improvement of his cellulitis. He was discharged home with prescription for a 7-day course of doxycycline. On 6/11 he was evaluated at the [facility] with palpable abscess of his right MTP joint and was given a 10-day course of amoxicillin and Bactrim and was referred to Orthopedic Surgery. Dr. Sharol Given performed I&D of his abscess and he was given a cleansing soap and antibiotic ointment. He reports not foot pain, discomfort, drainage, or swelling. His creatinine however, has increased to 2.12 and he will be admitted for management of acute AKI.  He denies headache, fever/chills, chest pain, shortness of breath, abdominal pain, dysuria, diarrhea, or decreased intake per mouth.   Hospital Course by problem list:  Acute on chronic renal failure - His creatinine was up from baseline of 1.2 to 1.5 during his clinic visit on Friday. He was admitted and given IV fluids. FeNa was >3% and was more likely to be false elevation from bactrim versus ATN from antibiotic courses. HIV medications were help on admission due to  concern for nephrotoxicity from lamivudine. He did improve over the next day. Renal ultrasound was done to exclude obstruction and he was found to have hazy region which was clarified with Peter to be likely normal renal tissue. Given his recent weight loss we felt it was necessary to exclude malignancy.   Abscess of great toe of right foot - Stable and mostly improved. Still some area but without drainage or erythema. No indication for antibiotics at this time and advised to stop all antibiotics at discharge.   HYPERTENSION - Lisinopril held during this admission and can be restarted in a few weeks if his creatinine is stable. Otherwise blood pressure controlled.   HIV disease - HIV regimen held during this admission due to possible nephrotoxicity. He will see Dr. [name] on 12/10/12 for resumption. Per Dr. Johnnye Sima consider changing his therapy to [name]/integrase inhibitor at f/u if his CrCl improves (currently ~40). [name] cannot be given with a CrCl <50 so would wait  to restart until he has f/u and hopefully his Cr has improved. The etiology of his increased Cr is ? Could be due to Bactrim, he denies use of NSAIDS, has been linked to ritonivir historically (although he has been on this for ~ 2 years).  Discharge Vitals:   BP 120/78  Pulse 66  Temp(Src) 97.8 F (36.6 C) (Oral)  Resp 18  Ht 5\' 6"  (1.676 m)  Wt 126 lb 11.8 oz (57.489 kg)  BMI 20.47 kg/m2  SpO2 100%  Discharge Labs:  Results for orders placed during the hospital encounter of 12/07/12 (from the past 24 hour(s))  CBC     Status: None   Collection Time    12/09/12  4:50 AM      Result Value Range   WBC 5.7  4.0 - 10.5 K/uL   RBC 4.69  4.22 - 5.81 MIL/uL   Hemoglobin 13.8  13.0 - 17.0 g/dL   HCT 40.3  39.0 - 52.0 %   MCV 85.9  78.0 - 100.0 fL   MCH 29.4  26.0 - 34.0 pg   MCHC 34.2  30.0 - 36.0 g/dL   RDW 12.9  11.5 - 15.5 %   Platelets 213  150 - 400 K/uL    Signed: Olga Millers, MD 12/09/2012, 9:58 AM   Time  Spent on Discharge: 26 minutes Services Ordered on Discharge: none Equipment Ordered on Discharge: none

## 2012-12-09 NOTE — Discharge Summary (Signed)
Internal Medicine Teaching Service Attending Note Date: 12/09/2012  Patient name: Peter Cox  Medical record number: [id number]  Date of birth: 1961/05/29    I evaluated the patient on the day of discharge and discussed the discharge plan with my resident team. I agree with the discharge documentation and disposition by Dr. Doug Sou.     Thanks Madilyn Fireman 12/09/2012, 5:03 PM

## 2012-12-10 ENCOUNTER — Ambulatory Visit (INDEPENDENT_AMBULATORY_CARE_PROVIDER_SITE_OTHER): Payer: Medicare Other | Admitting: Internal Medicine

## 2012-12-10 ENCOUNTER — Encounter: Payer: Self-pay | Admitting: Internal Medicine

## 2012-12-10 ENCOUNTER — Encounter: Payer: Medicare Other | Admitting: Internal Medicine

## 2012-12-10 VITALS — BP 135/84 | HR 77 | Temp 98.2°F | Resp 16

## 2012-12-10 VITALS — BP 134/85 | HR 75 | Temp 97.8°F | Ht 67.0 in | Wt 126.0 lb

## 2012-12-10 DIAGNOSIS — N179 Acute kidney failure, unspecified: Secondary | ICD-10-CM

## 2012-12-10 DIAGNOSIS — I10 Essential (primary) hypertension: Secondary | ICD-10-CM

## 2012-12-10 DIAGNOSIS — I129 Hypertensive chronic kidney disease with stage 1 through stage 4 chronic kidney disease, or unspecified chronic kidney disease: Secondary | ICD-10-CM

## 2012-12-10 DIAGNOSIS — L02611 Cutaneous abscess of right foot: Secondary | ICD-10-CM

## 2012-12-10 DIAGNOSIS — B20 Human immunodeficiency virus [HIV] disease: Secondary | ICD-10-CM

## 2012-12-10 DIAGNOSIS — N189 Chronic kidney disease, unspecified: Secondary | ICD-10-CM

## 2012-12-10 DIAGNOSIS — L02619 Cutaneous abscess of unspecified foot: Secondary | ICD-10-CM

## 2012-12-10 DIAGNOSIS — L03039 Cellulitis of unspecified toe: Secondary | ICD-10-CM

## 2012-12-10 DIAGNOSIS — N259 Disorder resulting from impaired renal tubular function, unspecified: Secondary | ICD-10-CM

## 2012-12-10 MED ORDER — RITONAVIR 100 MG PO TABS: 100.0000 mg | ORAL_TABLET | Freq: Every day | ORAL | Status: DC

## 2012-12-10 MED ORDER — DARUNAVIR ETHANOLATE 800 MG PO TABS: 800.0000 mg | ORAL_TABLET | Freq: Every day | ORAL | Status: DC

## 2012-12-10 MED ORDER — ABACAVIR SULFATE-LAMIVUDINE 600-300 MG PO TABS: 1.0000 | ORAL_TABLET | Freq: Every day | ORAL | Status: DC

## 2012-12-10 NOTE — Assessment & Plan Note (Addendum)
The right great toe infection is completely resolved.   - He is encouraged to follow up with Dr. Sharol Given per instructed ( appt in 3 weeks) - There was a small concern of possible OM during the worsening of his great toe infection, even though right toe x-ray was negative for bony erosion or abnormality. His abscess is completely resolved with ABX and I&D treatment, which lowers the pretest probability of OM.  -Will not perform MRI for now since he is free of s/s infection and recovering from AKI. - will consider right toe MRI if he has recurrent swelling, redness, warmth to touch and tenderness, or fever/chills.  Patient is instructed to call with above symptoms.

## 2012-12-10 NOTE — Assessment & Plan Note (Signed)
This has resolved, he is going to continue follow up with his orthopedist.

## 2012-12-10 NOTE — Progress Notes (Signed)
Subjective:   Patient ID: Peter Cox male   DOB: 1960-09-19 52 y.o.   MRN: [id number]  Chief compliant: hospital follow up.  HPI: Peter Cox is a 52 y.o. man with PMH of hypertension and HIV ( last CD4 340 and a viral load 25548 on 11/22/12), who presents to the clinic for hospital followup.  Patient was recently hospitalized for right foot cellulitis between 06/05-06/06 2014.  And right great toe soft tissue abscess developed and was treated with additional ABX and I&D by Dr. Sharol Given on 6/12 and 6/19. He was then noted to have AKI and hospitalized again between 6/20-6/22/14. His HIV medication and ACEI were on hold upon hospital discharge. He has had an earlier appt with ID DR. Comer for initiation of his HIV medications. He is here for hospital follow up.  He reports that his right toe infection is completely resolved. No foot pain/discomfort or drainage. Denies fever or chills. He reports medical compliance. .     Past Medical History  Diagnosis Date  . Hypertension   . Cocaine abuse      clean since about 2000  . Marijuana abuse   . Alcohol abuse     heavy, clean since April  . Tobacco abuse   . Mild cognitive impairment     likely vascular dementia/psa/?HIV componentCT Head 02/12/08 for htn crisis and concern for stroke: impression-1. Old right internal capsule and thalamic lacunar infarcts. 2. Mild to moderate chronic small vessell white matter ischemic changes in both cerebral hemisphere, greater on the right  . HIV infection     dx 08/08/2008 (unknown how he was exposed)  Initial VL 43,200 and CD4 410 on 08/28/08   Current Outpatient Prescriptions  Medication Sig Dispense Refill  . abacavir-lamiVUDine ([name]) 600-300 MG per tablet Take 1 tablet by mouth daily.  30 tablet  5  . amLODipine (NORVASC) 10 MG tablet Take 1 tablet (10 mg total) by mouth daily.  30 tablet  0  . aspirin 81 MG tablet Take 1 tablet (81 mg total) by mouth daily.      . Darunavir Ethanolate  (PREZISTA) 800 MG tablet Take 1 tablet (800 mg total) by mouth daily.  30 tablet  5  . pravastatin (PRAVACHOL) 40 MG tablet Take 1 tablet (40 mg total) by mouth at bedtime.  30 tablet  0  . ritonavir (NORVIR) 100 MG TABS Take 1 tablet (100 mg total) by mouth daily.  30 tablet  5   No current facility-administered medications for this visit.   Family History  Problem Relation Age of Onset  . Diabetes Mother   . Hypertension Mother   . Stroke Mother   . Diabetes Father   . Hypertension Sister   . Hypertension Sister    History   Social History  . Marital Status: Single    Spouse Name: N/A    Number of Children: N/A  . Years of Education: N/A   Social History Main Topics  . Smoking status: Former Smoker -- 0.50 packs/day for 40 years    Types: Cigarettes  . Smokeless tobacco: Never Used     Comment: Quit last week.  . Alcohol Use: No     Comment: last drink 2-3 months ago.  . Drug Use: 7.00 per week    Special: Marijuana  . Sexually Active: Not on file   Other Topics Concern  . Not on file   Social History Narrative   Lives in [place] with male friend Peter Clap  Ronnald Cox), and her family.  They have been together x 5yrs but are no longer sexually active.  Not working. 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 a grown daughter   Review of Systems: Review of Systems:  Constitutional:  Denies fever, chills, diaphoresis, appetite change and fatigue.   HEENT:  Denies congestion, sore throat, rhinorrhea, sneezing, mouth sores, trouble swallowing, neck pain   Respiratory:  Denies SOB, DOE, cough, and wheezing.   Cardiovascular:  Denies palpitations and leg swelling.   Gastrointestinal:  Denies nausea, vomiting, abdominal pain, diarrhea, constipation, blood in stool and abdominal distention.   Genitourinary:  Denies dysuria, urgency, frequency, hematuria, flank pain and difficulty urinating.   Musculoskeletal:  Denies myalgias, back pain,  arthralgias and gait problem.   negative for right great toe swelling.  Skin:  Denies pallor, rash and wound.   Neurological:  Denies dizziness, seizures, syncope, weakness, light-headedness, numbness and headaches.    .    Objective:  Physical Exam: Filed Vitals:   12/10/12 1755  BP: 135/84  Pulse: 77  Temp: 98.2 F (36.8 C)  Resp: 16  SpO2: 100%   General: alert, well-developed, and cooperative to examination.  Head: normocephalic and atraumatic.  Eyes: vision grossly intact, pupils equal, pupils round, pupils reactive to light, no injection and anicteric.  Mouth: pharynx pink and moist, no erythema, and no exudates.  Neck: supple, full ROM, no thyromegaly, no JVD, and no carotid bruits.  Lungs: normal respiratory effort, no accessory muscle use, normal breath sounds, no crackles, and no wheezes. Heart: normal rate, regular rhythm, no murmur, no gallop, and no rub.  Abdomen: soft, non-tender, normal bowel sounds, no distention, no guarding, no rebound tenderness, no hepatomegaly, and no splenomegaly.  Msk: no tenderness, swelling, warmth, or redness over right great toe MTP joint.  Pulses: 2+ DP/PT pulses bilaterally Extremities: No cyanosis, clubbing, edema Neurologic: alert & oriented X3, cranial nerves II-XII intact, strength normal in all extremities, sensation intact to light touch, and gait normal.  Skin: turgor normal and no rashes.  Psych: Oriented X3, memory intact for recent and remote, normally interactive, good eye contact, not anxious appearing, and not depressed appearing.  Assessment & Plan:

## 2012-12-10 NOTE — Assessment & Plan Note (Signed)
Blood pressure well controlled on amlodipine 10 mg daily.  - Will continue on current regimen. - Will continue to hold ACE inhibitor lisinopril. - See my discussion on acute on chronic renal failure.

## 2012-12-10 NOTE — Assessment & Plan Note (Signed)
I am going to have him start back on the same regimen with the [name], Prezista and Norvir. I will then have him recheck his labs in about 3 weeks to assure his kidneys remain stable and his viral load goes back to undetectable. It was previously undetectable but he has been off of medicines now about 2 weeks.

## 2012-12-10 NOTE — Assessment & Plan Note (Signed)
This seems to have resolved and will restart his regimen. This will be monitored again closely back on his regimen.

## 2012-12-10 NOTE — Assessment & Plan Note (Addendum)
He was recently hospitalized for acute on chronic renal failure likely associated with antibiotic treatment, ACE inhibitor, HIV medication and decreased oral intake.  His ACEI and HIV medications were on hold upon hospital discharge on 12/09/2012.  He was evaluated by ID Dr. [name] and ART therapy initiated today. His ACEI is still on hold.  - last Cr 1.29 yesterday, which was back to his baseline.  - Will continue to hold his ACE inhibitor given good blood pressure control on amlodipine - Dr. [name] ordered repeat CMP in 3 weeks.  - follow up in one month for BP and kidney function monitoring. - patient is instructed to monitor home BP daily with BP goal of < 140/90. Call the clinic if BP > 140/90.

## 2012-12-10 NOTE — Progress Notes (Signed)
  Subjective:    Patient ID: Peter Cox, male    DOB: [birth year], 52 y.o.   MRN: [id number]  HPI Comes in for followup of his HIV. He has been on a regimen of Prezista, Norvir and [name].  He was recently hospitalized at 2 acute on chronic crit and kidney injury and foot infection. His foot infection has healed and he has been following Dr. Sharol Given.  He otherwise has been off his HIV medicines do to the kidney injury but did get his repeat creatinine from his primary doctor yesterday and it is in normal limits. He feels well with no dysuria, increased urination or other new issues. He is eating well. No weight loss   Review of Systems  Constitutional: Negative for fever, chills and fatigue.  HENT: Negative for sore throat and trouble swallowing.   Respiratory: Negative for shortness of breath.   Cardiovascular: Negative for chest pain, palpitations and leg swelling.  Gastrointestinal: Negative for nausea.  Musculoskeletal: Negative for myalgias, joint swelling and arthralgias.  Skin: Negative for rash.  Neurological: Negative for dizziness, light-headedness and headaches.  Hematological: Negative for adenopathy.       Objective:   Physical Exam  Constitutional: He appears well-developed and well-nourished. No distress.  HENT:  Mouth/Throat: No oropharyngeal exudate.  Eyes: Right eye exhibits no discharge. Left eye exhibits no discharge. No scleral icterus.  Cardiovascular: Normal rate, regular rhythm and normal heart sounds.   No murmur heard. Pulmonary/Chest: Effort normal and breath sounds normal. No respiratory distress. He has no wheezes. He has no rales.  Lymphadenopathy:    He has no cervical adenopathy.  Skin: Skin is warm and dry. No rash noted.          Assessment & Plan:

## 2012-12-10 NOTE — Patient Instructions (Addendum)
1. Will need to come back for a lab visit on 12/31/12. 2. Follow up in one month for BP visit since he is off ACEI. 3. Please check your Blood pressure daily and you goal is < 140/90. If your blood pressure is higher than 140/90, please come to the clinic for BP management. Otherwise, see [name] in one month.  4. If you experience right toe selling, redness and pain, please see [name] ASAP.

## 2012-12-11 NOTE — Progress Notes (Signed)
Case discussed with Dr. Li soon after the resident saw the patient. We reviewed the resident's history and exam and pertinent patient test results. I agree with the assessment, diagnosis, and plan of care documented in the resident's note. 

## 2012-12-13 ENCOUNTER — Ambulatory Visit: Payer: Medicare Other | Admitting: Internal Medicine

## 2012-12-17 NOTE — Progress Notes (Signed)
This encounter was created in error - please disregard.

## 2012-12-27 ENCOUNTER — Ambulatory Visit: Payer: Medicare Other | Admitting: Infectious Disease

## 2012-12-31 ENCOUNTER — Encounter: Payer: Self-pay | Admitting: Internal Medicine

## 2012-12-31 ENCOUNTER — Ambulatory Visit (INDEPENDENT_AMBULATORY_CARE_PROVIDER_SITE_OTHER): Payer: Medicare Other | Admitting: Internal Medicine

## 2012-12-31 ENCOUNTER — Other Ambulatory Visit (INDEPENDENT_AMBULATORY_CARE_PROVIDER_SITE_OTHER): Payer: Medicare Other

## 2012-12-31 VITALS — BP 117/77 | HR 76 | Temp 97.3°F | Ht 66.0 in | Wt 130.0 lb

## 2012-12-31 DIAGNOSIS — B20 Human immunodeficiency virus [HIV] disease: Secondary | ICD-10-CM

## 2012-12-31 DIAGNOSIS — Z23 Encounter for immunization: Secondary | ICD-10-CM

## 2012-12-31 DIAGNOSIS — I10 Essential (primary) hypertension: Secondary | ICD-10-CM

## 2012-12-31 DIAGNOSIS — L02611 Cutaneous abscess of right foot: Secondary | ICD-10-CM

## 2012-12-31 DIAGNOSIS — L03039 Cellulitis of unspecified toe: Secondary | ICD-10-CM

## 2012-12-31 DIAGNOSIS — L02619 Cutaneous abscess of unspecified foot: Secondary | ICD-10-CM

## 2012-12-31 DIAGNOSIS — Z113 Encounter for screening for infections with a predominantly sexual mode of transmission: Secondary | ICD-10-CM

## 2012-12-31 LAB — CBC WITH DIFFERENTIAL/PLATELET
Basophils Absolute: 0 10*3/uL (ref 0.0–0.1)
Basophils Relative: 1 % (ref 0–1)
Eosinophils Absolute: 0.1 10*3/uL (ref 0.0–0.7)
Eosinophils Relative: 2 % (ref 0–5)
HCT: 37.4 % — ABNORMAL LOW (ref 39.0–52.0)
Hemoglobin: 12.8 g/dL — ABNORMAL LOW (ref 13.0–17.0)
Lymphocytes Relative: 22 % (ref 12–46)
Lymphs Abs: 1.2 10*3/uL (ref 0.7–4.0)
MCH: 29.9 pg (ref 26.0–34.0)
MCHC: 34.2 g/dL (ref 30.0–36.0)
MCV: 87.4 fL (ref 78.0–100.0)
Monocytes Absolute: 0.3 10*3/uL (ref 0.1–1.0)
Monocytes Relative: 5 % (ref 3–12)
Neutro Abs: 3.9 10*3/uL (ref 1.7–7.7)
Neutrophils Relative %: 70 % (ref 43–77)
Platelets: 177 10*3/uL (ref 150–400)
RBC: 4.28 MIL/uL (ref 4.22–5.81)
RDW: 15.6 % — ABNORMAL HIGH (ref 11.5–15.5)
WBC: 5.4 10*3/uL (ref 4.0–10.5)

## 2012-12-31 LAB — COMPLETE METABOLIC PANEL WITH GFR
ALT: 23 U/L (ref 0–53)
AST: 17 U/L (ref 0–37)
Albumin: 4.1 g/dL (ref 3.5–5.2)
Alkaline Phosphatase: 78 U/L (ref 39–117)
BUN: 22 mg/dL (ref 6–23)
CO2: 27 mEq/L (ref 19–32)
Calcium: 9.2 mg/dL (ref 8.4–10.5)
Chloride: 104 mEq/L (ref 96–112)
Creat: 1.69 mg/dL — ABNORMAL HIGH (ref 0.50–1.35)
GFR, Est African American: 52 mL/min — ABNORMAL LOW
GFR, Est Non African American: 45 mL/min — ABNORMAL LOW
Glucose, Bld: 72 mg/dL (ref 70–99)
Potassium: 3.9 mEq/L (ref 3.5–5.3)
Sodium: 141 mEq/L (ref 135–145)
Total Bilirubin: 0.4 mg/dL (ref 0.3–1.2)
Total Protein: 7.3 g/dL (ref 6.0–8.3)

## 2012-12-31 NOTE — Assessment & Plan Note (Signed)
BP Readings from Last 3 Encounters:  12/31/12 117/77  12/10/12 135/84  12/10/12 135/84    Lab Results  Component Value Date   NA 135 12/09/2012   K 4.5 12/09/2012   CREATININE 1.29 12/09/2012    Assessment: Blood pressure control: controlled Progress toward BP goal:  at goal Comments: See below  Plan: Medications:  continue current medications Educational resources provided:   Self management tools provided:   Other plans: Good control despite medication noncompliance. Continue amlodipine 10mg . I encouraged his caretaker to give him this medication consistently from now on.

## 2012-12-31 NOTE — Assessment & Plan Note (Signed)
Resolved. No present concern for OM. - No need for MRI unless he develops recurrent swelling, redness, warmth, tenderness, fever.

## 2012-12-31 NOTE — Progress Notes (Signed)
  Subjective:    Patient ID: Peter Cox, male    DOB: 16-May-1961, 52 y.o.   MRN: [id number]  HPI [name] is a 52 y.o. man with PMH of hypertension and HIV (last CD4 340 and a viral load 25548 on 11/22/12), who presents to the clinic for follow up.  He reports that the infection in his right toe for which he was hospitalized has completely resolved. He denies swelling, erythema, warmth, fevers, chills.  He has not been taking his blood pressure readings at home as requested. In the office today it was 117/77.  He is unsure of which medications taking. His caretaker/friend [name] was brought in the room, and she reports he has poor vision and low literacy which has let to noncompliance in the past. She has been managing his medications for him since hospital discharge, both paying to refil them and organizing them in a pill box. She reports always giving him his HIV medications, but withholding his Norvasc and pravastatin sometimes because she "doesn't think he needs them." She is interested in talking to a Education officer, museum about ways to make him more independent in terms of is medications, perhaps by learning the color of the pills.  Earlier today he had labs drawn including a CBC, CMP, CD4, and HIV viral load, ordered by ID. These have not resulted yet.   Review of Systems  Constitutional: Negative for fever and chills.  HENT: Negative for congestion and rhinorrhea.   Eyes: Negative for visual disturbance.  Respiratory: Negative for cough and shortness of breath.   Cardiovascular: Negative for chest pain and leg swelling.  Gastrointestinal: Negative for nausea, vomiting and abdominal pain.  Genitourinary: Negative for dysuria and difficulty urinating.  Musculoskeletal: Negative for joint swelling and arthralgias.  Skin: Negative for rash.  Neurological: Negative for dizziness, light-headedness and headaches.       Objective:   Physical Exam  Constitutional: He is oriented  to person, place, and time. He appears well-developed and well-nourished.  HENT:  Head: Normocephalic and atraumatic.  Mouth/Throat: Oropharynx is clear and moist.  Eyes: EOM are normal. Pupils are equal, round, and reactive to light.  Neck: Normal range of motion. Neck supple.  Cardiovascular: Normal rate, regular rhythm, normal heart sounds and intact distal pulses.   Pulmonary/Chest: Effort normal and breath sounds normal.  Abdominal: Soft. Bowel sounds are normal.  Musculoskeletal: Normal range of motion.  Lymphadenopathy:    He has no cervical adenopathy.  Neurological: He is alert and oriented to person, place, and time.  Skin: Skin is warm and dry.  Right toe MTP joint is non-tender with no erythema, warmth, or swelling. Some residual darkened skin changes.          Assessment & Plan:

## 2012-12-31 NOTE — Assessment & Plan Note (Addendum)
Medication understanding and compliance is an issue with this patient. He currently has a friend helping him, but even she is inconsistent with providing him all of his medications. - He and his caretaker were encouraged to talk to social work at the [facility] regarding ways to reduce the cost of his medications (currently $12-13) and improve medication compliance and independence. They will know more specifically what programs are available for HIV patients than our clinic social worker. - Follow up lab work (CD4, viral load, CBC, CMP). NOTE: Patient would like [name] to notify his caretaker [name] at [phone] with any abnormal results. - Appt with Dr. Tommy Medal in ID on 01/23/13

## 2012-12-31 NOTE — Patient Instructions (Addendum)
Thanks for your visit: - Your appointment with Dr. Tommy Medal in the [facility] is on 01/23/13 at 11:15am - Please follow up with the social worker in the [facility] regarding medication cost and pill identification. They will know more about the current resources for HIV patients. - Your medications are listed. Please make sure to take these as prescribed. - Please try to get your glasses prescription filled. It's important that you see well for your safety and so you can read your pill bottles. - I will call your caretaker [name] about any abnormal blood work. - Please follow up with me in 1 month

## 2013-01-01 LAB — HIV-1 RNA QUANT-NO REFLEX-BLD
HIV 1 RNA Quant: 131 copies/mL — ABNORMAL HIGH (ref <20)
HIV-1 RNA Quant, Log: 2.12 {Log} — ABNORMAL HIGH (ref <1.30)

## 2013-01-01 LAB — T-HELPER CELL (CD4) - (RCID CLINIC ONLY)
CD4 % Helper T Cell: 43 % (ref 33–55)
CD4 T Cell Abs: 470 uL (ref 400–2700)

## 2013-01-01 NOTE — Progress Notes (Signed)
I saw patient and discussed his care with Dr. Lucila Maine at the time of the visit.  We reviewed the resident's history and exam and pertinent patient test results.  I agree with the assessment, diagnosis, and plan of care documented in the resident's note.

## 2013-01-23 ENCOUNTER — Ambulatory Visit (INDEPENDENT_AMBULATORY_CARE_PROVIDER_SITE_OTHER): Payer: Medicare Other | Admitting: Infectious Disease

## 2013-01-23 ENCOUNTER — Encounter: Payer: Self-pay | Admitting: Infectious Disease

## 2013-01-23 VITALS — BP 128/83 | HR 60 | Temp 97.3°F | Ht 66.0 in | Wt 134.0 lb

## 2013-01-23 DIAGNOSIS — B20 Human immunodeficiency virus [HIV] disease: Secondary | ICD-10-CM

## 2013-01-23 DIAGNOSIS — F0280 Dementia in other diseases classified elsewhere without behavioral disturbance: Secondary | ICD-10-CM

## 2013-01-23 DIAGNOSIS — R21 Rash and other nonspecific skin eruption: Secondary | ICD-10-CM

## 2013-01-23 DIAGNOSIS — I10 Essential (primary) hypertension: Secondary | ICD-10-CM

## 2013-01-23 DIAGNOSIS — E785 Hyperlipidemia, unspecified: Secondary | ICD-10-CM

## 2013-01-23 DIAGNOSIS — F1910 Other psychoactive substance abuse, uncomplicated: Secondary | ICD-10-CM

## 2013-01-23 DIAGNOSIS — Z599 Problem related to housing and economic circumstances, unspecified: Secondary | ICD-10-CM

## 2013-01-23 MED ORDER — TRIAMCINOLONE ACETONIDE 0.5 % EX OINT: TOPICAL_OINTMENT | Freq: Two times a day (BID) | CUTANEOUS | Status: DC
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
## 2013-01-30 ENCOUNTER — Other Ambulatory Visit: Payer: Self-pay | Admitting: *Deleted

## 2013-01-30 ENCOUNTER — Encounter: Payer: Self-pay | Admitting: Internal Medicine

## 2013-01-30 DIAGNOSIS — B20 Human immunodeficiency virus [HIV] disease: Secondary | ICD-10-CM

## 2013-01-30 MED ORDER — RITONAVIR 100 MG PO TABS: 100.0000 mg | ORAL_TABLET | Freq: Every day | ORAL | Status: DC

## 2013-01-30 MED ORDER — ABACAVIR SULFATE-LAMIVUDINE 600-300 MG PO TABS: 1.0000 | ORAL_TABLET | Freq: Every day | ORAL | Status: DC

## 2013-01-30 MED ORDER — DARUNAVIR ETHANOLATE 800 MG PO TABS: 800.0000 mg | ORAL_TABLET | Freq: Every day | ORAL | Status: DC

## 2013-02-01 ENCOUNTER — Encounter: Payer: Self-pay | Admitting: Internal Medicine

## 2013-02-01 ENCOUNTER — Ambulatory Visit (INDEPENDENT_AMBULATORY_CARE_PROVIDER_SITE_OTHER): Payer: Medicare Other | Admitting: Internal Medicine

## 2013-02-01 VITALS — BP 115/75 | HR 61 | Temp 97.2°F | Ht 65.0 in | Wt 131.8 lb

## 2013-02-01 DIAGNOSIS — E785 Hyperlipidemia, unspecified: Secondary | ICD-10-CM

## 2013-02-01 DIAGNOSIS — B20 Human immunodeficiency virus [HIV] disease: Secondary | ICD-10-CM

## 2013-02-01 DIAGNOSIS — M25519 Pain in unspecified shoulder: Secondary | ICD-10-CM

## 2013-02-01 DIAGNOSIS — I10 Essential (primary) hypertension: Secondary | ICD-10-CM

## 2013-02-01 DIAGNOSIS — M25512 Pain in left shoulder: Secondary | ICD-10-CM

## 2013-02-01 MED ORDER — ACETAMINOPHEN 325 MG PO TABS: 650.0000 mg | ORAL_TABLET | Freq: Four times a day (QID) | ORAL | Status: DC | PRN

## 2013-02-01 NOTE — Patient Instructions (Signed)
General Instructions: Please take Tylenol for your shoulder pain. If the pain persists, call clinic and get seen again  Please come back to see me in several months  Treatment Goals:  Goals (1 Years of Data) as of 02/01/13   None      Progress Toward Treatment Goals:  Treatment Goal 02/01/2013  Blood pressure at goal    Self Care Goals & Plans:  Self Care Goal 02/01/2013  Manage my medications take my medicines as prescribed; bring my medications to every visit; refill my medications on time; follow the sick day instructions if I am sick  Monitor my health keep track of my blood pressure  Eat healthy foods eat more vegetables; eat fruit for snacks and desserts; eat baked foods instead of fried foods; eat smaller portions  Be physically active find an activity I enjoy; take a walk every day  Stop smoking -

## 2013-02-01 NOTE — Progress Notes (Signed)
Patient ID: Peter Cox, male   DOB: [birth year], 52 y.o.   MRN: [id number]   Subjective:   HPI: PeterHawkin D Cox is a 52 y.o. gentleman with past medical history of HIV, dyslipidemia, history of substance abuse, hypertension, chronic renal insufficiency presents to the clinic for followup visit. His friend accompanies him.  Patient is complaining of chronic left shoulder pain, which tends to worsen during cold weather. He has not been very keen to use any pain medications in fear of relapsing with his polysubstance abuse. However, he sometimes uses pot as this helps with her pain. He describes his pain as 10 out of 10 during cold days, increasing with the use of that shoulder. Patient has a remote history of surgery way he reports the screws were inserted in his left shoulder joint in the 1980s. No imaging studies on this shoulder epic. Since that time, the pain has been chronic with intermittent worsening. He denies any recent fevers, chills, nausea, fatigue, or change in appetite. His friend is concerned that his pain is leading him to appearing with a socially drawn. No other joint pains reported by the patient.  Otherwise, the patient reports compliance with her HIV medications and his antihypertensives.    Past Medical History  Diagnosis Date  . Hypertension   . Cocaine abuse      clean since about 2000  . Marijuana abuse   . Alcohol abuse     heavy, clean since April  . Tobacco abuse   . Mild cognitive impairment     likely vascular dementia/psa/?HIV componentCT Head 02/12/08 for htn crisis and concern for stroke: impression-1. Old right internal capsule and thalamic lacunar infarcts. 2. Mild to moderate chronic small vessell white matter ischemic changes in both cerebral hemisphere, greater on the right  . HIV infection     dx 08/08/2008 (unknown how he was exposed)  Initial VL 43,200 and CD4 410 on 08/28/08   Current Outpatient Prescriptions  Medication Sig Dispense Refill  .  abacavir-lamiVUDine ([name]) 600-300 MG per tablet Take 1 tablet by mouth daily.  30 tablet  5  . amLODipine (NORVASC) 10 MG tablet Take 1 tablet (10 mg total) by mouth daily.  30 tablet  0  . aspirin 81 MG tablet Take 1 tablet (81 mg total) by mouth daily.      . Darunavir Ethanolate (PREZISTA) 800 MG tablet Take 1 tablet (800 mg total) by mouth daily.  30 tablet  5  . pravastatin (PRAVACHOL) 40 MG tablet Take 1 tablet (40 mg total) by mouth at bedtime.  30 tablet  0  . ritonavir (NORVIR) 100 MG TABS tablet Take 1 tablet (100 mg total) by mouth daily.  30 tablet  5  . triamcinolone ointment (KENALOG) 0.5 % Apply topically 2 (two) times daily.  30 g  1  . acetaminophen (TYLENOL) 325 MG tablet Take 2 tablets (650 mg total) by mouth every 6 (six) hours as needed for pain.  100 tablet  0   No current facility-administered medications for this visit.   Family History  Problem Relation Age of Onset  . Diabetes Mother   . Hypertension Mother   . Stroke Mother   . Diabetes Father   . Hypertension Sister   . Hypertension Sister    History   Social History  . Marital Status: Single    Spouse Name: N/A    Number of Children: N/A  . Years of Education: N/A   Social History Main Topics  .  Smoking status: Current Every Day Smoker -- 0.10 packs/day for 40 years    Types: Cigarettes  . Smokeless tobacco: Never Used     Comment: Quit  x 2 week.  . Alcohol Use: No     Comment: last drink 2-3 months ago.  . Drug Use: 7.00 per week    Special: Marijuana  . Sexual Activity: None   Other Topics Concern  . None   Social History Narrative   Lives in [place] with male friend [name]), and her family.  They have been together x 70yrs but are no longer sexually active.  Not working. 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  Respiratory: Denies SOB, DOE, cough,  chest tightness, and wheezing.  Cardiovascular: No chest pain, palpitations and leg swelling.  Gastrointestinal: No abdominal pain, nausea, vomiting, bloody stools Genitourinary: No dysuria, frequency, hematuria, or flank pain.   Objective:  Physical Exam: Filed Vitals:   02/01/13 1014  BP: 115/75  Pulse: 61  Temp: 97.2 F (36.2 C)  TempSrc: Oral  Height: 5\' 5"  (1.651 m)  Weight: 131 lb 12.8 oz (59.784 kg)  SpO2: 100%   General: Well nourished. No acute distress.  Lungs: CTA bilaterally. Heart: RRR; no extra sounds or murmurs  Abdomen: Non-distended, normal BS, soft, nontender; no hepatosplenomegaly  Extremities: No pedal edema. No joint swelling or tenderness. Left Shoulder: Normal range of motion. Mild tenderness with deep palpation, no evidence of fluid accumulation. No warmth or erythema. Neurologic: Alert and oriented x3. No obvious neurologic deficits.  Assessment & Plan:  I have discussed my assessment and plan  with Dr. Marinda Elk as detailed under problem based charting.

## 2013-02-01 NOTE — Assessment & Plan Note (Signed)
12/10/2012 Office Visit Written 12/10/2012 5:41 PM by Charlann Lange, MD    Blood pressure well controlled on amlodipine 10 mg daily.  - Will continue on current regimen.  - Will continue to hold ACE inhibitor lisinopril.  - See my discussion on acute on chronic renal failure.       11/22/2012 Office Visit Written 11/22/2012 11:57 AM by Tonia Brooms, MD    Poor control due to med noncompliance. Will address med compliance at next visit once discharged from hospital. He will at the minimum need HH for meds as he has demonstrated inability/unwillingness to take on his own - likely related to cognitive decline.  Maybe we can get him plugged in to bridge counselor through [facility] during hospital admission, otherwise will have our CSW follow up at next [facility] visit.

## 2013-02-01 NOTE — Assessment & Plan Note (Addendum)
Exam does not indicate septic arthritis. Has good range of motion. His friend enquired about therapeutic marijuana prescription.   Plan  - will try tylenol as needed  - does not wish to take narcs. He would not a good candidate for narcs due to history of polysubstance abuse  - encouraged him to come back his pain persists - I will see him in 1-2 months.

## 2013-02-01 NOTE — Assessment & Plan Note (Signed)
11/22/2012 Office Visit Written 11/22/2012 11:58 AM by Tonia Brooms, MD    Not taking any ART, no showed recent [facility] appt.  -notify ID of hospital admission, ? Plugged into bridge counselor for med compliance concerns in setting of cognitive impairment  - ordered CD4 count, VL, genotype

## 2013-03-12 ENCOUNTER — Encounter: Payer: Self-pay | Admitting: Internal Medicine

## 2013-03-12 DIAGNOSIS — Z8673 Personal history of transient ischemic attack (TIA), and cerebral infarction without residual deficits: Secondary | ICD-10-CM | POA: Insufficient documentation

## 2013-03-13 ENCOUNTER — Ambulatory Visit (INDEPENDENT_AMBULATORY_CARE_PROVIDER_SITE_OTHER): Payer: Medicare Other | Admitting: Internal Medicine

## 2013-03-13 DIAGNOSIS — Z5989 Other problems related to housing and economic circumstances: Secondary | ICD-10-CM

## 2013-03-13 DIAGNOSIS — Z23 Encounter for immunization: Secondary | ICD-10-CM

## 2013-03-13 DIAGNOSIS — Z0000 Encounter for general adult medical examination without abnormal findings: Secondary | ICD-10-CM

## 2013-03-13 DIAGNOSIS — IMO0001 Reserved for inherently not codable concepts without codable children: Secondary | ICD-10-CM | POA: Insufficient documentation

## 2013-03-13 DIAGNOSIS — F17200 Nicotine dependence, unspecified, uncomplicated: Secondary | ICD-10-CM | POA: Insufficient documentation

## 2013-03-13 DIAGNOSIS — I10 Essential (primary) hypertension: Secondary | ICD-10-CM

## 2013-03-13 DIAGNOSIS — N182 Chronic kidney disease, stage 2 (mild): Secondary | ICD-10-CM

## 2013-03-13 DIAGNOSIS — Z598 Other problems related to housing and economic circumstances: Secondary | ICD-10-CM

## 2013-03-13 DIAGNOSIS — Z8673 Personal history of transient ischemic attack (TIA), and cerebral infarction without residual deficits: Secondary | ICD-10-CM

## 2013-03-13 DIAGNOSIS — Z5919 Other inadequate housing: Secondary | ICD-10-CM | POA: Insufficient documentation

## 2013-03-13 DIAGNOSIS — B20 Human immunodeficiency virus [HIV] disease: Secondary | ICD-10-CM

## 2013-03-13 MED ORDER — AMLODIPINE BESYLATE 10 MG PO TABS: 10.0000 mg | ORAL_TABLET | Freq: Every day | ORAL | Status: DC

## 2013-03-13 NOTE — Assessment & Plan Note (Signed)
Counseled him about cigarette smoking cessation. He wants to try quit on his own. Does not wish to start NRT. Will continue to encourage smoking cessation.

## 2013-03-13 NOTE — Assessment & Plan Note (Signed)
No obvious clinical signs of CKD. I will order a urine microalbumin/creatinine ratio today.

## 2013-03-13 NOTE — Progress Notes (Signed)
Patient ID: Peter Cox, male   DOB: January 29, 1961, 52 y.o.   MRN: [id number]   Subjective:   HPI: [name] is a 52 y.o. gentleman with past medical history of hypertension, remote history of a cocaine abuse, alcohol abuse, and current tobacco smoking, mild cognitive impairment with memory loss, HIV infection, and history of CVAs, presents to the clinic with his girlfriend for a routine followup visit.  No specific complaints today except that the girlfriend mentions that she is concerned about his living condition with marginal housing. She is in the process of assisting the patient to relocate to [place] into a new apartment. She also informs me that Dr. Tommy Cox of ID clinic is assisting completing the paperwork involved. I offered to help if needed. She is also concerned about his medication compliance. She does not stay with the patient, but checks on him several times a week. She reports that he has been out of his Amlodipine for one week but he has the rest of his medications including ARVs. They do not have his pill bottles today.  Kindly see the A&P for the status of the pt's chronic medical problems.   Past Medical History  Diagnosis Date  . Hypertension   . Cocaine abuse      clean since about 2000  . Marijuana abuse   . Alcohol abuse     heavy, clean since April  . Tobacco abuse   . Mild cognitive impairment     likely vascular dementia/psa/?HIV componentCT Head 02/12/08 for htn crisis and concern for stroke: impression-1. Old right internal capsule and thalamic lacunar infarcts. 2. Mild to moderate chronic small vessell white matter ischemic changes in both cerebral hemisphere, greater on the right  . HIV infection     dx 08/08/2008 (unknown how he was exposed)  Initial VL 43,200 and CD4 410 on 08/28/08   Current Outpatient Prescriptions  Medication Sig Dispense Refill  . abacavir-lamiVUDine ([name]) 600-300 MG per tablet Take 1 tablet by mouth daily.  30 tablet  5  .  acetaminophen (TYLENOL) 325 MG tablet Take 2 tablets (650 mg total) by mouth every 6 (six) hours as needed for pain.  100 tablet  0  . amLODipine (NORVASC) 10 MG tablet Take 1 tablet (10 mg total) by mouth daily.  30 tablet  12  . aspirin 81 MG tablet Take 1 tablet (81 mg total) by mouth daily.      . Darunavir Ethanolate (PREZISTA) 800 MG tablet Take 1 tablet (800 mg total) by mouth daily.  30 tablet  5  . pravastatin (PRAVACHOL) 40 MG tablet Take 1 tablet (40 mg total) by mouth at bedtime.  30 tablet  0  . ritonavir (NORVIR) 100 MG TABS tablet Take 1 tablet (100 mg total) by mouth daily.  30 tablet  5  . triamcinolone ointment (KENALOG) 0.5 % Apply topically 2 (two) times daily.  30 g  1   No current facility-administered medications for this visit.   Family History  Problem Relation Age of Onset  . Diabetes Mother   . Hypertension Mother   . Stroke Mother   . Diabetes Father   . Hypertension Sister   . Hypertension Sister    History   Social History  . Marital Status: Single    Spouse Name: N/A    Number of Children: N/A  . Years of Education: N/A   Social History Main Topics  . Smoking status: Current Every Day Smoker -- 0.10  packs/day for 40 years    Types: Cigarettes  . Smokeless tobacco: Never Used     Comment: Quit  x 2 week.  . Alcohol Use: No     Comment: last drink 2-3 months ago.  . Drug Use: 7.00 per week    Special: Marijuana  . Sexual Activity: Not on file   Other Topics Concern  . Not on file   Social History Narrative   Lives in [place] with male friend Peter Cox), and her family.  They have been together x 23yrs but are no longer sexually active.  Not working. 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  Respiratory: Denies SOB, DOE, cough, chest tightness, and wheezing.  Cardiovascular: No chest pain, palpitations and leg  swelling.  Gastrointestinal: No abdominal pain, nausea, vomiting, bloody stools Genitourinary: No dysuria, frequency, hematuria, or flank pain.  Musculoskeletal: No myalgias, back pain, joint swelling, arthralgias, right shoulder pain is better. No requests for pain medications.  Objective:  Physical Exam: Pressure 161/87, pulse rate 60.  General: Well nourished. No acute distress.  Lungs: CTA bilaterally. Heart: RRR; no extra sounds or murmurs  Abdomen: Non-distended, normal BS, soft, nontender; no hepatosplenomegaly  Extremities: No pedal edema.  Neurologic: Alert and oriented x3. No obvious neurologic deficits.  Assessment & Plan:  I have discussed my assessment and plan  with Dr. Ellwood Cox  as detailed under problem based charting.

## 2013-03-13 NOTE — Patient Instructions (Addendum)
General Instructions: Please take your medications as prescribed.  Please bring your medication on every visit  I have referred you for a screening colonoscopy  I will do a urine check today Please come back in one month.    Treatment Goals:  Goals (1 Years of Data) as of 03/13/13         02/01/13 01/23/13 12/31/12 12/10/12 12/10/12     Blood Pressure    . Blood Pressure < 140/90  115/75 128/83 117/77 135/84 135/84    . Blood Pressure < 140/90  115/75 128/83 117/77 135/84 135/84     Result Component    . LDL CALC < 90            Progress Toward Treatment Goals:  Treatment Goal 03/13/2013  Blood pressure deteriorated    Self Care Goals & Plans:  Self Care Goal 03/13/2013  Manage my medications take my medicines as prescribed; bring my medications to every visit; refill my medications on time; follow the sick day instructions if I am sick  Monitor my health -  Eat healthy foods -  Be physically active -  Stop smoking -       Care Management & Community Referrals:

## 2013-03-13 NOTE — Assessment & Plan Note (Signed)
Patient's girlfriend is concerned about Peter Cox his housing situation. She informs me that Dr. Lucianne Lei dam is assisting in completing the paperwork for a new housing in [facility]. I will continue to help him follow up.   if needed, our Education officer, museum, and I can assist arrange with any required paperwork.

## 2013-03-13 NOTE — Assessment & Plan Note (Signed)
CD4 count in July 2014 470 with a viral load of 131. Reports good compliance with antiretroviral therapy. Followup with [facility].

## 2013-03-13 NOTE — Assessment & Plan Note (Signed)
Given him a flu shot and tdap today Referral for screening colonoscopy today

## 2013-03-13 NOTE — Assessment & Plan Note (Addendum)
BP Readings from Last 3 Encounters:  02/01/13 115/75  01/23/13 128/83  12/31/12 117/77    Lab Results  Component Value Date   NA 141 12/31/2012   K 3.9 12/31/2012   CREATININE 1.69* 12/31/2012    Assessment: Blood pressure control: moderately elevated Progress toward BP goal:  deteriorated Comments: He has been off his amlodipine for one week.  Plan: Medications:  continue current medications, Emphasized compliance in early refills. Educational resources provided:   Self management tools provided:   Other plans: Patient has issues with compliance to his medications. Blood pressure previously well-controlled with amlodipine 10 mg daily. He has been off for one week. Does not need addition of any other medications. I will call in his amlodipine today and I have emphasized to him good compliance. He cannot afford a BP cuff. I will see him in one months. Encouraged him to bring his medication on next visit.

## 2013-03-14 LAB — MICROALBUMIN / CREATININE URINE RATIO
Creatinine, Urine: 102.3 mg/dL
Microalb Creat Ratio: 252.7 mg/g — ABNORMAL HIGH (ref 0.0–30.0)
Microalb, Ur: 25.85 mg/dL — ABNORMAL HIGH (ref 0.00–1.89)

## 2013-03-15 NOTE — Progress Notes (Signed)
Case discussed with Dr.Kazibwe at the time of the visit.  We reviewed the resident's history and exam and pertinent patient test results.  I agree with the assessment, diagnosis, and plan of care documented in the resident's note.    

## 2013-03-27 ENCOUNTER — Ambulatory Visit (INDEPENDENT_AMBULATORY_CARE_PROVIDER_SITE_OTHER): Payer: Medicare Other | Admitting: Infectious Disease

## 2013-03-27 ENCOUNTER — Encounter: Payer: Self-pay | Admitting: Infectious Disease

## 2013-03-27 VITALS — BP 140/92 | HR 76 | Temp 97.4°F | Wt 134.0 lb

## 2013-03-27 DIAGNOSIS — R2240 Localized swelling, mass and lump, unspecified lower limb: Secondary | ICD-10-CM | POA: Insufficient documentation

## 2013-03-27 DIAGNOSIS — I10 Essential (primary) hypertension: Secondary | ICD-10-CM

## 2013-03-27 DIAGNOSIS — F19988 Other psychoactive substance use, unspecified with other psychoactive substance-induced disorder: Secondary | ICD-10-CM

## 2013-03-27 DIAGNOSIS — D179 Benign lipomatous neoplasm, unspecified: Secondary | ICD-10-CM

## 2013-03-27 DIAGNOSIS — B20 Human immunodeficiency virus [HIV] disease: Secondary | ICD-10-CM

## 2013-03-27 DIAGNOSIS — R2242 Localized swelling, mass and lump, left lower limb: Secondary | ICD-10-CM

## 2013-03-27 DIAGNOSIS — R229 Localized swelling, mass and lump, unspecified: Secondary | ICD-10-CM

## 2013-03-27 DIAGNOSIS — F12929 Cannabis use, unspecified with intoxication, unspecified: Secondary | ICD-10-CM

## 2013-03-27 DIAGNOSIS — E785 Hyperlipidemia, unspecified: Secondary | ICD-10-CM

## 2013-03-27 LAB — CBC WITH DIFFERENTIAL/PLATELET
Basophils Absolute: 0 10*3/uL (ref 0.0–0.1)
Basophils Relative: 1 % (ref 0–1)
Eosinophils Absolute: 0.2 10*3/uL (ref 0.0–0.7)
Eosinophils Relative: 4 % (ref 0–5)
HCT: 38.4 % — ABNORMAL LOW (ref 39.0–52.0)
Hemoglobin: 13.4 g/dL (ref 13.0–17.0)
Lymphocytes Relative: 24 % (ref 12–46)
Lymphs Abs: 1.3 10*3/uL (ref 0.7–4.0)
MCH: 31.8 pg (ref 26.0–34.0)
MCHC: 34.9 g/dL (ref 30.0–36.0)
MCV: 91 fL (ref 78.0–100.0)
Monocytes Absolute: 0.3 10*3/uL (ref 0.1–1.0)
Monocytes Relative: 5 % (ref 3–12)
Neutro Abs: 3.7 10*3/uL (ref 1.7–7.7)
Neutrophils Relative %: 66 % (ref 43–77)
Platelets: 210 10*3/uL (ref 150–400)
RBC: 4.22 MIL/uL (ref 4.22–5.81)
RDW: 14.8 % (ref 11.5–15.5)
WBC: 5.5 10*3/uL (ref 4.0–10.5)

## 2013-03-27 LAB — COMPLETE METABOLIC PANEL WITH GFR
ALT: 13 U/L (ref 0–53)
AST: 13 U/L (ref 0–37)
Albumin: 3.9 g/dL (ref 3.5–5.2)
Alkaline Phosphatase: 86 U/L (ref 39–117)
BUN: 18 mg/dL (ref 6–23)
CO2: 28 mEq/L (ref 19–32)
Calcium: 9 mg/dL (ref 8.4–10.5)
Chloride: 105 mEq/L (ref 96–112)
Creat: 1.6 mg/dL — ABNORMAL HIGH (ref 0.50–1.35)
GFR, Est African American: 56 mL/min — ABNORMAL LOW
GFR, Est Non African American: 48 mL/min — ABNORMAL LOW
Glucose, Bld: 98 mg/dL (ref 70–99)
Potassium: 3.9 mEq/L (ref 3.5–5.3)
Sodium: 138 mEq/L (ref 135–145)
Total Bilirubin: 0.3 mg/dL (ref 0.3–1.2)
Total Protein: 7.1 g/dL (ref 6.0–8.3)

## 2013-03-27 NOTE — Progress Notes (Signed)
  Subjective:    Patient ID: Peter Cox, male    DOB: November 27, 1960, 52 y.o.   MRN: [id number]  HPI   52  year old with hx HTN, smoking and HIV is better controlled now back on Prezista, Norvir and [name] with VL down below 200.  He is accompanied by his girlfriend and caretaker who is concerned that Peter Cox misses doses when he gets stoned.   She states he is not drinking etoh at all.  Peter Cox admits to missing 1-2 doses a week and his girlfriend states that he does miss if she is not "after him."  We reviewed nature of HIV protection, data from [employer] on prevention, use of condoms.  I offered to test his girlfriend. She is also concerned for area on Peter Cox's thight that is raised nontender.    Review of Systems  Constitutional: Negative for chills, diaphoresis, activity change, appetite change, fatigue and unexpected weight change.  HENT: Negative for congestion, rhinorrhea, sinus pressure, sneezing, sore throat and trouble swallowing.   Eyes: Negative for photophobia and visual disturbance.  Respiratory: Negative for cough, chest tightness, shortness of breath, wheezing and stridor.   Cardiovascular: Negative for chest pain, palpitations and leg swelling.  Gastrointestinal: Negative for nausea, vomiting, abdominal pain, diarrhea, constipation, blood in stool, abdominal distention and anal bleeding.  Genitourinary: Negative for dysuria, hematuria, flank pain and difficulty urinating.  Musculoskeletal: Negative for arthralgias, back pain, gait problem, joint swelling and myalgias.  Skin: Negative for color change, pallor, rash and wound.  Neurological: Negative for dizziness, tremors, weakness and light-headedness.  Hematological: Negative for adenopathy. Does not bruise/bleed easily.  Psychiatric/Behavioral: Negative for behavioral problems, confusion, sleep disturbance, dysphoric mood, decreased concentration and agitation.       Objective:   Physical Exam    Constitutional: He is oriented to person, place, and time. He appears well-developed and well-nourished. No distress.  HENT:  Head: Normocephalic and atraumatic.  Mouth/Throat: Oropharynx is clear and moist. No oropharyngeal exudate.  Eyes: Conjunctivae and EOM are normal. Pupils are equal, round, and reactive to light. No scleral icterus.  Neck: Normal range of motion. Neck supple. No JVD present.  Cardiovascular: Normal rate, regular rhythm and normal heart sounds.  Exam reveals no gallop and no friction rub.   No murmur heard. Pulmonary/Chest: Effort normal and breath sounds normal. No respiratory distress. He has no wheezes. He has no rales. He exhibits no tenderness.  Abdominal: He exhibits no distension and no mass. There is no tenderness. There is no rebound and no guarding.  Musculoskeletal: He exhibits no edema and no tenderness.       Legs: Lymphadenopathy:    He has no cervical adenopathy.  Neurological: He is alert and oriented to person, place, and time. He has normal reflexes. He exhibits normal muscle tone. Coordination normal.  Skin: Skin is warm and dry. He is not diaphoretic. No erythema. No pallor.  Psychiatric: His behavior is normal. Judgment and thought content normal.  Quiet, not talkative, reserved          Assessment & Plan:   HIV: continue current regimen, check labs today, tighten up adherence. Test his girlfriend  HTN: continue amlodipine  Hyperlipdemia: continue statin   Substance abuse etohism per wife better more issue with THC intoxication and forgetting doses  HIV dementia: pill box provided, girlfriend helping  Thigh lesion: likely a lipoma but will consider MRI

## 2013-03-28 LAB — HIV-1 RNA QUANT-NO REFLEX-BLD
HIV 1 RNA Quant: <20 copies/mL (ref <20)
HIV-1 RNA Quant, Log: <1.3 {Log} (ref <1.30)

## 2013-03-28 LAB — T-HELPER CELL (CD4) - (RCID CLINIC ONLY)
CD4 % Helper T Cell: 44 % (ref 33–55)
CD4 T Cell Abs: 590 /uL (ref 400–2700)

## 2013-04-15 ENCOUNTER — Telehealth: Payer: Self-pay | Admitting: *Deleted

## 2013-04-15 ENCOUNTER — Encounter: Payer: Medicare Other | Admitting: Internal Medicine

## 2013-04-15 ENCOUNTER — Ambulatory Visit (INDEPENDENT_AMBULATORY_CARE_PROVIDER_SITE_OTHER): Payer: Medicare Other | Admitting: Internal Medicine

## 2013-04-15 VITALS — BP 120/80 | HR 69 | Temp 98.4°F | Ht 66.0 in | Wt 134.7 lb

## 2013-04-15 DIAGNOSIS — IMO0001 Reserved for inherently not codable concepts without codable children: Secondary | ICD-10-CM

## 2013-04-15 DIAGNOSIS — F17200 Nicotine dependence, unspecified, uncomplicated: Secondary | ICD-10-CM

## 2013-04-15 DIAGNOSIS — I10 Essential (primary) hypertension: Secondary | ICD-10-CM

## 2013-04-15 DIAGNOSIS — Z8673 Personal history of transient ischemic attack (TIA), and cerebral infarction without residual deficits: Secondary | ICD-10-CM

## 2013-04-15 DIAGNOSIS — Z0000 Encounter for general adult medical examination without abnormal findings: Secondary | ICD-10-CM

## 2013-04-15 DIAGNOSIS — B20 Human immunodeficiency virus [HIV] disease: Secondary | ICD-10-CM

## 2013-04-15 NOTE — Assessment & Plan Note (Signed)
BP Readings from Last 3 Encounters:  04/15/13 120/80  03/27/13 140/92  02/01/13 115/75    Lab Results  Component Value Date   NA 138 03/27/2013   K 3.9 03/27/2013   CREATININE 1.60* 03/27/2013    Assessment: Blood pressure control: controlled Progress toward BP goal:  at goal Comments: compliant with meds  Plan: Medications:  cont with Amlodipine 10 mg daily  Educational resources provided: brochure Self management tools provided: home blood pressure logbook Other plans: will f/u in 3 months

## 2013-04-15 NOTE — Assessment & Plan Note (Signed)
No ready to quit.

## 2013-04-15 NOTE — Assessment & Plan Note (Signed)
Immunisations: Flu shot: uptodate Tdap:uptodate Zostavax: no indicated Pneumovax: not indicated  Indicated Cancer Screening: Colonoscopy: referral to GI. Meanwhile we will do FOBT  Life style changes addressed: Smoking status: pt not ready to quit. Smokes 2 cig a day.

## 2013-04-15 NOTE — Telephone Encounter (Signed)
Call to [facility] -pt does not need an appointment until next year February.  Pt stated that he was to get glasses from there.  Spoke with pt's mother glasses have been reserved there.  Message was left for office to call about pt's glasses.  Sander Nephew, RN 04/15/2013 4:07 PM.

## 2013-04-15 NOTE — Progress Notes (Signed)
Patient ID: Peter Cox, male   DOB: [birth year], 52 y.o.   MRN: [id number]   Subjective:   HPI: [name] is a 52 y.o. gentleman with past medical history of hypertension, remote history of a cocaine abuse, alcohol abuse, and current tobacco smoking, mild cognitive impairment with memory loss, HIV infection, and history of CVAs, presents to the clinic with his girlfriend for a routine followup visit.  Last clinic visit on 03/13/2013 with me.   No specific complaints today.    Kindly see the A&P for the status of the pt's chronic medical problems.   Past Medical History  Diagnosis Date  . Hypertension   . Cocaine abuse      clean since about 2000  . Marijuana abuse   . Alcohol abuse     heavy, clean since April  . Tobacco abuse   . Mild cognitive impairment     likely vascular dementia/psa/?HIV componentCT Head 02/12/08 for htn crisis and concern for stroke: impression-1. Old right internal capsule and thalamic lacunar infarcts. 2. Mild to moderate chronic small vessell white matter ischemic changes in both cerebral hemisphere, greater on the right  . HIV infection     dx 08/08/2008 (unknown how he was exposed)  Initial VL 43,200 and CD4 410 on 08/28/08  . Hyperlipidemia   . Neuromuscular disorder    Current Outpatient Prescriptions  Medication Sig Dispense Refill  . abacavir-lamiVUDine ([name]) 600-300 MG per tablet Take 1 tablet by mouth daily.  30 tablet  5  . acetaminophen (TYLENOL) 325 MG tablet Take 2 tablets (650 mg total) by mouth every 6 (six) hours as needed for pain.  100 tablet  0  . amLODipine (NORVASC) 10 MG tablet Take 1 tablet (10 mg total) by mouth daily.  30 tablet  12  . aspirin 81 MG tablet Take 1 tablet (81 mg total) by mouth daily.      . Darunavir Ethanolate (PREZISTA) 800 MG tablet Take 1 tablet (800 mg total) by mouth daily.  30 tablet  5  . pravastatin (PRAVACHOL) 40 MG tablet Take 1 tablet (40 mg total) by mouth at bedtime.  30 tablet  0  .  ritonavir (NORVIR) 100 MG TABS tablet Take 1 tablet (100 mg total) by mouth daily.  30 tablet  5  . triamcinolone ointment (KENALOG) 0.5 % Apply topically 2 (two) times daily.  30 g  1   No current facility-administered medications for this visit.   Family History  Problem Relation Age of Onset  . Diabetes Mother   . Hypertension Mother   . Stroke Mother   . Diabetes Father   . Hypertension Sister   . Hypertension Sister    History   Social History  . Marital Status: Single    Spouse Name: N/A    Number of Children: N/A  . Years of Education: N/A   Social History Main Topics  . Smoking status: Current Every Day Smoker -- 0.10 packs/day for 40 years    Types: Cigarettes  . Smokeless tobacco: Never Used     Comment: Quit  x 2 week.  . Alcohol Use: No     Comment: last drink 2-3 months ago.  . Drug Use: 7.00 per week    Special: Marijuana  . Sexual Activity: Not on file   Other Topics Concern  . Not on file   Social History Narrative   Lives in [place] with male friend Peter Cox), and her family.  They have  been together x 32yrs but are no longer sexually active.  Not working. 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  Respiratory: Denies SOB, DOE, cough, chest tightness, and wheezing.  Cardiovascular: No chest pain, palpitations and leg swelling.  Gastrointestinal: No abdominal pain, nausea, vomiting, bloody stools Genitourinary: No dysuria, frequency, hematuria, or flank pain.  Musculoskeletal: No myalgias, back pain, joint swelling, arthralgias, right shoulder pain is better. No requests for pain medications.  Objective:  Physical Exam: Filed Vitals:   04/15/13 1411  BP: 120/80  Pulse: 69  Temp: 98.4 F (36.9 C)   General: Well nourished. No acute distress.  Lungs: CTA bilaterally. Heart: RRR; no extra sounds or murmurs    Abdomen: Non-distended, normal BS, soft, nontender; no hepatosplenomegaly  Extremities: No pedal edema.  Neurologic: Alert and oriented x3. No obvious neurologic deficits.  Assessment & Plan:  I have discussed my assessment and plan  with Dr. Ellwood Dense  as detailed under problem based charting.

## 2013-04-15 NOTE — Patient Instructions (Signed)
General Instructions:  Please take your medications  Please come back in 3 months  Treatment Goals:  Goals (1 Years of Data) as of 04/15/13         As of Today 03/27/13 02/01/13 01/23/13 12/31/12     Blood Pressure    . Blood Pressure < 140/90  120/80 140/92 115/75 128/83 117/77    . Blood Pressure < 140/90  120/80 140/92 115/75 128/83 117/77     Result Component    . LDL CALC < 90            Progress Toward Treatment Goals:  Treatment Goal 04/15/2013  Blood pressure at goal  Stop smoking smoking the same amount    Self Care Goals & Plans:  Self Care Goal 04/15/2013  Manage my medications take my medicines as prescribed; bring my medications to every visit; refill my medications on time  Monitor my health -  Eat healthy foods drink diet soda or water instead of juice or soda; eat more vegetables; eat baked foods instead of fried foods; eat foods that are low in salt  Be physically active -  Stop smoking -    No flowsheet data found.   Care Management & Community Referrals:  Referral 04/15/2013  Referrals made for care management support none needed

## 2013-04-17 NOTE — Progress Notes (Signed)
Case discussed with Dr.Kazibwe at the time of the visit.  We reviewed the resident's history and exam and pertinent patient test results.  I agree with the assessment, diagnosis, and plan of care documented in the resident's note.    

## 2013-05-27 ENCOUNTER — Ambulatory Visit: Payer: Medicare Other | Admitting: Infectious Disease

## 2013-06-07 ENCOUNTER — Telehealth: Payer: Self-pay | Admitting: *Deleted

## 2013-06-07 NOTE — Telephone Encounter (Signed)
Patient's caregiver [name] called wanting to reschedule pt's missed appointment from 12/8.  Pt has housing ready in [facility], but needs a letter from a doctor stating that he needs a caregiver to come to the home 3-4 times per week.  [name] states that she helps set up his medications, does what she can to make sure he takes them regularly, tries to get him to all of his medical appointments, runs errands for him. Appointment made with Dr. Tommy Medal for 12/22 at 9:15.  Confirmed with Alice.  Will make sure that Amber from housing is aware of the pending move/request of letter as well. Landis Gandy, RN

## 2013-06-10 ENCOUNTER — Ambulatory Visit: Payer: Medicare Other | Admitting: Infectious Disease

## 2013-06-10 NOTE — Telephone Encounter (Signed)
I think I filled out the form last week. Sorry I couldn't be in today

## 2013-06-10 NOTE — Telephone Encounter (Signed)
We found the form and Amber faxed it this afternoon.  A copy will be scanned for his chart.

## 2013-06-25 ENCOUNTER — Encounter: Payer: Self-pay | Admitting: Infectious Disease

## 2013-06-25 ENCOUNTER — Ambulatory Visit (INDEPENDENT_AMBULATORY_CARE_PROVIDER_SITE_OTHER): Payer: Medicare Other | Admitting: Infectious Disease

## 2013-06-25 VITALS — BP 148/90 | HR 76 | Temp 98.1°F | Wt 142.0 lb

## 2013-06-25 DIAGNOSIS — B20 Human immunodeficiency virus [HIV] disease: Secondary | ICD-10-CM

## 2013-06-25 DIAGNOSIS — F1490 Cocaine use, unspecified, uncomplicated: Secondary | ICD-10-CM

## 2013-06-25 DIAGNOSIS — I10 Essential (primary) hypertension: Secondary | ICD-10-CM

## 2013-06-25 DIAGNOSIS — F0280 Dementia in other diseases classified elsewhere without behavioral disturbance: Secondary | ICD-10-CM

## 2013-06-25 DIAGNOSIS — L989 Disorder of the skin and subcutaneous tissue, unspecified: Secondary | ICD-10-CM

## 2013-06-25 DIAGNOSIS — F1410 Cocaine abuse, uncomplicated: Secondary | ICD-10-CM

## 2013-06-25 DIAGNOSIS — F0150 Vascular dementia without behavioral disturbance: Secondary | ICD-10-CM

## 2013-06-25 DIAGNOSIS — F01518 Vascular dementia, unspecified severity, with other behavioral disturbance: Secondary | ICD-10-CM

## 2013-06-25 DIAGNOSIS — I639 Cerebral infarction, unspecified: Secondary | ICD-10-CM

## 2013-06-25 DIAGNOSIS — F0151 Vascular dementia with behavioral disturbance: Secondary | ICD-10-CM

## 2013-06-25 DIAGNOSIS — F1910 Other psychoactive substance abuse, uncomplicated: Secondary | ICD-10-CM

## 2013-06-25 DIAGNOSIS — F1210 Cannabis abuse, uncomplicated: Secondary | ICD-10-CM

## 2013-06-25 DIAGNOSIS — F1290 Cannabis use, unspecified, uncomplicated: Secondary | ICD-10-CM

## 2013-06-25 DIAGNOSIS — F1020 Alcohol dependence, uncomplicated: Secondary | ICD-10-CM

## 2013-06-25 DIAGNOSIS — I6350 Cerebral infarction due to unspecified occlusion or stenosis of unspecified cerebral artery: Secondary | ICD-10-CM

## 2013-06-25 DIAGNOSIS — R410 Disorientation, unspecified: Secondary | ICD-10-CM

## 2013-06-25 NOTE — Progress Notes (Signed)
Subjective:    Patient ID: Peter Cox, male    DOB: 04/06/1961, 53 y.o.   MRN: [id number]  HPI   53  year old with hx HTN, smoking and HIV is better controlled now back on Prezista, Norvir and [name] with VL down below 200 and CD4 count 590  He is accompanied by his girlfriend and caretaker who with Peter Cox are endeavoring to get him certified as being disabled so that he can have a "live in aid."  I actually the with the assertion that Peter Cox is disabled. He has had multiple strokes in the past as documented in electronic medical record as seen on most recent imaging and 2012 on CT of the head. In addition to the frank infarction the basal ganglia and thalamic nuclei he has evidence of microinfarct scattered throughout the white matter of his brain. Additionally he has problems with focusing and attention and with short-term memory. 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 I do not think that he can function well without assistance and his record of HIV care and taking of his antiretroviral medications has been sporadic and highly dependent upon his caretaker making sure that he takes these medicines.  She states he is not drinking etoh at all.  He doesn't go to her smoking marijuana every day but denies using any other drugs. A prior history of crack cocaine use and alcohol use was heavy.  He has a new area on his left thigh that has been slightly tender has been persistent for 3 weeks but seems to be going down.   Review of Systems  Constitutional: Negative for chills, diaphoresis, activity change, appetite change, fatigue and unexpected weight change.  HENT: Negative for congestion, rhinorrhea, sinus pressure, sneezing, sore throat and trouble swallowing.   Eyes: Negative for photophobia and visual disturbance.  Respiratory: Negative for cough, chest tightness, shortness of breath, wheezing and stridor.   Cardiovascular:  Negative for chest pain, palpitations and leg swelling.  Gastrointestinal: Negative for nausea, vomiting, abdominal pain, diarrhea, constipation, blood in stool, abdominal distention and anal bleeding.  Genitourinary: Negative for dysuria, hematuria, flank pain and difficulty urinating.  Musculoskeletal: Negative for arthralgias, back pain, gait problem, joint swelling and myalgias.  Skin: Positive for color change. Negative for pallor, rash and wound.  Neurological: Negative for dizziness, tremors, weakness and light-headedness.  Hematological: Negative for adenopathy. Does not bruise/bleed easily.  Psychiatric/Behavioral: Positive for decreased concentration. Negative for behavioral problems, confusion, sleep disturbance, dysphoric mood and agitation.       Objective:   Physical Exam  Constitutional: He is oriented to person, place, and time. No distress.  HENT:  Head: Normocephalic and atraumatic.  Mouth/Throat: Oropharynx is clear and moist. No oropharyngeal exudate.  Eyes: Conjunctivae and EOM are normal. No scleral icterus.  Neck: Normal range of motion. Neck supple.  Cardiovascular: Normal rate, regular rhythm and normal heart sounds.  Exam reveals no gallop and no friction rub.   No murmur heard. Pulmonary/Chest: Effort normal and breath sounds normal. No respiratory distress. He has no wheezes. He has no rales.  Abdominal: He exhibits no distension. There is no tenderness. There is no rebound.  Musculoskeletal:       Legs: Neurological: He is alert and oriented to person, place, and time. He exhibits normal muscle tone. Coordination normal.  Skin: Skin is warm and dry. He is not diaphoretic. No erythema. No pallor.  Psychiatric: His behavior is normal. Judgment and  thought content normal. His speech is delayed.  Quiet, more talkative today than usual.          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 I do think he is disabled and I do think he would greatly benefit from a live-in aide. I've written a letter to this effect inside it provided to Peter Cox and his girlfriend. Also signed forms for him.  Pill box  I spent greater than 40 minutes with the patient including greater than 50% of time in face to face counsel of the patient and in coordination of their care.  HIV: continue current regimen, check labs today,   HTN: continue amlodipine  Hyperlipdemia: continue statin   Substance abuse etohism per wife better more issue with THC intoxication and forgetting doses   Thigh lesion: Peter Cox saw this lesion the last time I examined Peter Cox and wondered if it might represent a lipoma. It is going down in size and don't think we need to resort to imaging at this point in time.

## 2013-06-27 LAB — T-HELPER CELL (CD4) - (RCID CLINIC ONLY)
CD4 % Helper T Cell: 48 % (ref 33–55)
CD4 T Cell Abs: 760 /uL (ref 400–2700)

## 2013-06-28 LAB — HIV-1 RNA QUANT-NO REFLEX-BLD
HIV 1 RNA Quant: 49 copies/mL — ABNORMAL HIGH (ref <20)
HIV-1 RNA Quant, Log: 1.69 {Log} — ABNORMAL HIGH (ref <1.30)

## 2013-07-09 ENCOUNTER — Encounter: Payer: Self-pay | Admitting: Internal Medicine

## 2013-07-10 ENCOUNTER — Encounter: Payer: Self-pay | Admitting: Internal Medicine

## 2013-07-10 ENCOUNTER — Encounter: Payer: Medicare Other | Admitting: Internal Medicine

## 2013-07-17 ENCOUNTER — Other Ambulatory Visit: Payer: Self-pay | Admitting: *Deleted

## 2013-07-17 ENCOUNTER — Encounter: Payer: Medicare Other | Admitting: Internal Medicine

## 2013-07-17 DIAGNOSIS — B20 Human immunodeficiency virus [HIV] disease: Secondary | ICD-10-CM

## 2013-07-17 MED ORDER — ABACAVIR SULFATE-LAMIVUDINE 600-300 MG PO TABS: 1.0000 | ORAL_TABLET | Freq: Every day | ORAL | Status: DC

## 2013-07-17 MED ORDER — RITONAVIR 100 MG PO TABS: 100.0000 mg | ORAL_TABLET | Freq: Every day | ORAL | Status: DC

## 2013-07-17 MED ORDER — DARUNAVIR ETHANOLATE 800 MG PO TABS: 800.0000 mg | ORAL_TABLET | Freq: Every day | ORAL | Status: DC

## 2013-08-12 ENCOUNTER — Other Ambulatory Visit: Payer: Medicare Other

## 2013-08-26 ENCOUNTER — Ambulatory Visit: Payer: Medicare Other | Admitting: Infectious Disease

## 2013-09-02 ENCOUNTER — Ambulatory Visit (INDEPENDENT_AMBULATORY_CARE_PROVIDER_SITE_OTHER): Payer: Medicare Other | Admitting: Infectious Disease

## 2013-09-02 ENCOUNTER — Encounter: Payer: Self-pay | Admitting: Infectious Disease

## 2013-09-02 VITALS — BP 104/64 | HR 103 | Temp 98.7°F | Wt 134.0 lb

## 2013-09-02 DIAGNOSIS — M25512 Pain in left shoulder: Secondary | ICD-10-CM

## 2013-09-02 DIAGNOSIS — B20 Human immunodeficiency virus [HIV] disease: Secondary | ICD-10-CM

## 2013-09-02 DIAGNOSIS — F0280 Dementia in other diseases classified elsewhere without behavioral disturbance: Secondary | ICD-10-CM

## 2013-09-02 DIAGNOSIS — I6350 Cerebral infarction due to unspecified occlusion or stenosis of unspecified cerebral artery: Secondary | ICD-10-CM

## 2013-09-02 DIAGNOSIS — F1910 Other psychoactive substance abuse, uncomplicated: Secondary | ICD-10-CM

## 2013-09-02 DIAGNOSIS — E785 Hyperlipidemia, unspecified: Secondary | ICD-10-CM

## 2013-09-02 DIAGNOSIS — I639 Cerebral infarction, unspecified: Secondary | ICD-10-CM

## 2013-09-02 DIAGNOSIS — M25519 Pain in unspecified shoulder: Secondary | ICD-10-CM

## 2013-09-02 NOTE — Progress Notes (Signed)
Subjective:    Patient ID: Peter Cox, male    DOB: 09/13/1960, 53 y.o.   MRN: [id number]  Shoulder Pain  The pain is present in the left shoulder. This is a chronic problem. The current episode started more than 1 year ago. There has been no history of extremity trauma. The problem occurs 2 to 4 times per day. The problem has been gradually worsening. The quality of the pain is described as aching. The pain is at a severity of 4/10. The pain is mild. Associated symptoms include joint locking. Pertinent negatives include no fever. The symptoms are aggravated by activity. He has tried NSAIDS for the symptoms. The treatment provided mild relief.    53  year old with hx HTN, smoking and HIV is better controlled now back on Prezista, Norvir and [name] with VL well controlled and healthy CD4 count. .  She states he is not drinking etoh at all.  He does continue to  smoke marijuana every day but denies using any other drugs. A prior history of crack cocaine use and alcohol use was heavy.  He is c/o pain in left shoulder where a "bolt" was placed in the past by Dr. Noemi Chapel.   Review of Systems  Constitutional: Negative for fever, chills, diaphoresis, activity change, appetite change, fatigue and unexpected weight change.  HENT: Negative for congestion, rhinorrhea, sinus pressure, sneezing, sore throat and trouble swallowing.   Eyes: Negative for photophobia and visual disturbance.  Respiratory: Negative for cough, chest tightness, shortness of breath, wheezing and stridor.   Cardiovascular: Negative for chest pain, palpitations and leg swelling.  Gastrointestinal: Negative for nausea, vomiting, abdominal pain, diarrhea, constipation, blood in stool, abdominal distention and anal bleeding.  Genitourinary: Negative for dysuria, hematuria, flank pain and difficulty urinating.  Musculoskeletal: Positive for arthralgias. Negative for back pain, gait problem, joint swelling and myalgias.  Skin:  Negative for pallor, rash and wound.  Neurological: Negative for dizziness, tremors, weakness and light-headedness.  Hematological: Negative for adenopathy. Does not bruise/bleed easily.  Psychiatric/Behavioral: Negative for behavioral problems, confusion, sleep disturbance, dysphoric mood and agitation.       Objective:   Physical Exam  Constitutional: He is oriented to person, place, and time. No distress.  HENT:  Head: Normocephalic and atraumatic.  Mouth/Throat: Oropharynx is clear and moist. No oropharyngeal exudate.  Eyes: Conjunctivae and EOM are normal. No scleral icterus.  Neck: Normal range of motion. Neck supple.  Cardiovascular: Normal rate, regular rhythm and normal heart sounds.  Exam reveals no gallop and no friction rub.   No murmur heard. Pulmonary/Chest: Effort normal and breath sounds normal. No respiratory distress. He has no wheezes. He has no rales.  Abdominal: He exhibits no distension. There is no tenderness. There is no rebound.  Musculoskeletal:       Left shoulder: He exhibits tenderness.       Legs: Neurological: He is alert and oriented to person, place, and time. He exhibits normal muscle tone. Coordination normal.  Skin: Skin is warm and dry. He is not diaphoretic. No erythema. No pallor.  Psychiatric: His behavior is normal. Judgment and thought content normal. His speech is delayed.  Quiet, more talkative today than usual.          Assessment & Plan:   #1HIV: continue current regimen, check labs in [facility]   #2 HTN: continue amlodipine much better controlled  #3 hx of CVA: continue statin, bp control  #4 Hyperlipdemia: continue statin   #5 Substance abuse etohism  per wife better more issue with THC intoxication and per her he is in danger of losing his apt. I counselled him in his own interest to not make such drug use obvious to his land lord and that he might need to stop this altogether out of his own interest in current  circumstances  #6 smoking: > 3 minutes counsellign re smoking cessation and gave handouts to smoking line. He did not want to try chantix   #7 Left shoulder pain: he can have this re-evaluted by his Orthopedic surgeon  #8 HIV dementia: doing better with structure

## 2014-01-13 ENCOUNTER — Other Ambulatory Visit: Payer: Self-pay | Admitting: *Deleted

## 2014-01-13 DIAGNOSIS — B20 Human immunodeficiency virus [HIV] disease: Secondary | ICD-10-CM

## 2014-01-13 MED ORDER — ABACAVIR SULFATE-LAMIVUDINE 600-300 MG PO TABS: 1.0000 | ORAL_TABLET | Freq: Every day | ORAL | Status: DC

## 2014-01-13 MED ORDER — DARUNAVIR ETHANOLATE 800 MG PO TABS: 800.0000 mg | ORAL_TABLET | Freq: Every day | ORAL | Status: DC

## 2014-01-13 MED ORDER — RITONAVIR 100 MG PO TABS: 100.0000 mg | ORAL_TABLET | Freq: Every day | ORAL | Status: DC

## 2014-01-22 ENCOUNTER — Ambulatory Visit (INDEPENDENT_AMBULATORY_CARE_PROVIDER_SITE_OTHER): Payer: Medicare Other | Admitting: Internal Medicine

## 2014-01-22 DIAGNOSIS — S6980XA Other specified injuries of unspecified wrist, hand and finger(s), initial encounter: Secondary | ICD-10-CM

## 2014-01-22 DIAGNOSIS — S6990XA Unspecified injury of unspecified wrist, hand and finger(s), initial encounter: Secondary | ICD-10-CM

## 2014-01-22 DIAGNOSIS — IMO0001 Reserved for inherently not codable concepts without codable children: Secondary | ICD-10-CM

## 2014-01-22 DIAGNOSIS — E785 Hyperlipidemia, unspecified: Secondary | ICD-10-CM

## 2014-01-22 DIAGNOSIS — F17200 Nicotine dependence, unspecified, uncomplicated: Secondary | ICD-10-CM

## 2014-01-22 DIAGNOSIS — S6992XA Unspecified injury of left wrist, hand and finger(s), initial encounter: Secondary | ICD-10-CM

## 2014-01-22 DIAGNOSIS — Z0000 Encounter for general adult medical examination without abnormal findings: Secondary | ICD-10-CM

## 2014-01-22 DIAGNOSIS — D179 Benign lipomatous neoplasm, unspecified: Secondary | ICD-10-CM

## 2014-01-22 DIAGNOSIS — B20 Human immunodeficiency virus [HIV] disease: Secondary | ICD-10-CM

## 2014-01-22 DIAGNOSIS — I10 Essential (primary) hypertension: Secondary | ICD-10-CM

## 2014-01-22 MED ORDER — PRAVASTATIN SODIUM 40 MG PO TABS: 40.0000 mg | ORAL_TABLET | Freq: Every day | ORAL | Status: DC

## 2014-01-22 MED ORDER — AMLODIPINE BESYLATE 10 MG PO TABS: 10.0000 mg | ORAL_TABLET | Freq: Every day | ORAL | Status: DC

## 2014-01-22 NOTE — Patient Instructions (Addendum)
General Instructions: Please take your medications as before  Please stop smoking.  Please come back to come back in three month.    Treatment Goals:  Goals (1 Years of Data) as of 01/22/14         09/02/13 06/25/13 04/15/13 03/27/13 02/01/13     Blood Pressure    . Blood Pressure < 140/90  104/64 148/90 120/80 140/92 115/75    . Blood Pressure < 140/90  104/64 148/90 120/80 140/92 115/75     Result Component    . LDL CALC < 90            Progress Toward Treatment Goals:  Treatment Goal 01/22/2014  Blood pressure at goal  Stop smoking smoking the same amount    Self Care Goals & Plans:  Self Care Goal 01/22/2014  Manage my medications take my medicines as prescribed; bring my medications to every visit; refill my medications on time; follow the sick day instructions if I am sick  Monitor my health -  Eat healthy foods -  Be physically active -  Stop smoking -    No flowsheet data found.   Care Management & Community Referrals:  Referral 01/22/2014  Referrals made for care management support none needed    Smoking Cessation Quitting smoking is important to your health and has many advantages. However, it is not always easy to quit since nicotine is a very addictive drug. Oftentimes, people try 3 times or more before being able to quit. This document explains the best ways for you to prepare to quit smoking. Quitting takes hard work and a lot of effort, but you can do it. ADVANTAGES OF QUITTING SMOKING  You will live longer, feel better, and live better.  Your body will feel the impact of quitting smoking almost immediately.  Within 20 minutes, blood pressure decreases. Your pulse returns to its normal level.  After 8 hours, carbon monoxide levels in the blood return to normal. Your oxygen level increases.  After 24 hours, the chance of having a heart attack starts to decrease. Your breath, hair, and body stop smelling like smoke.  After 48 hours, damaged nerve endings  begin to recover. Your sense of taste and smell improve.  After 72 hours, the body is virtually free of nicotine. Your bronchial tubes relax and breathing becomes easier.  After 2 to 12 weeks, lungs can hold more air. Exercise becomes easier and circulation improves.  The risk of having a heart attack, stroke, cancer, or lung disease is greatly reduced.  After 1 year, the risk of coronary heart disease is cut in half.  After 5 years, the risk of stroke falls to the same as a nonsmoker.  After 10 years, the risk of lung cancer is cut in half and the risk of other cancers decreases significantly.  After 15 years, the risk of coronary heart disease drops, usually to the level of a nonsmoker.  If you are pregnant, quitting smoking will improve your chances of having a healthy baby.  The people you live with, especially any children, will be healthier.  You will have extra money to spend on things other than cigarettes. QUESTIONS TO THINK ABOUT BEFORE ATTEMPTING TO QUIT You may want to talk about your answers with your health care provider.  Why do you want to quit?  If you tried to quit in the past, what helped and what did not?  What will be the most difficult situations for you after you quit?  How will you plan to handle them?  Who can help you through the tough times? Your family? Friends? A health care provider?  What pleasures do you get from smoking? What ways can you still get pleasure if you quit? Here are some questions to ask your health care provider:  How can you help me to be successful at quitting?  What medicine do you think would be best for me and how should I take it?  What should I do if I need more help?  What is smoking withdrawal like? How can I get information on withdrawal? GET READY  Set a quit date.  Change your environment by getting rid of all cigarettes, ashtrays, matches, and lighters in your home, car, or work. Do not let people smoke in your  home.  Review your past attempts to quit. Think about what worked and what did not. GET SUPPORT AND ENCOURAGEMENT You have a better chance of being successful if you have help. You can get support in many ways.  Tell your family, friends, and coworkers that you are going to quit and need their support. Ask them not to smoke around you.  Get individual, group, or telephone counseling and support. Programs are available at [employer] and health centers. Call your local health department for information about programs in your area.  Spiritual beliefs and practices may help some smokers quit.  Download a "quit meter" on your computer to keep track of quit statistics, such as how long you have gone without smoking, cigarettes not smoked, and money saved.  Get a self-help book about quitting smoking and staying off tobacco. Three Points yourself from urges to smoke. Talk to someone, go for a walk, or occupy your time with a task.  Change your normal routine. Take a different route to work. Drink tea instead of coffee. Eat breakfast in a different place.  Reduce your stress. Take a hot bath, exercise, or read a book.  Plan something enjoyable to do every day. Reward yourself for not smoking.  Explore interactive web-based programs that specialize in helping you quit. GET MEDICINE AND USE IT CORRECTLY Medicines can help you stop smoking and decrease the urge to smoke. Combining medicine with the above behavioral methods and support can greatly increase your chances of successfully quitting smoking.  Nicotine replacement therapy helps deliver nicotine to your body without the negative effects and risks of smoking. Nicotine replacement therapy includes nicotine gum, lozenges, inhalers, nasal sprays, and skin patches. Some may be available over-the-counter and others require a prescription.  Antidepressant medicine helps people abstain from smoking, but how this  works is unknown. This medicine is available by prescription.  Nicotinic receptor partial agonist medicine simulates the effect of nicotine in your brain. This medicine is available by prescription. Ask your health care provider for advice about which medicines to use and how to use them based on your health history. Your health care provider will tell you what side effects to look out for if you choose to be on a medicine or therapy. Carefully read the information on the package. Do not use any other product containing nicotine while using a nicotine replacement product.  RELAPSE OR DIFFICULT SITUATIONS Most relapses occur within the first 3 months after quitting. Do not be discouraged if you start smoking again. Remember, most people try several times before finally quitting. You may have symptoms of withdrawal because your body is used to nicotine. You may crave  cigarettes, be irritable, feel very hungry, cough often, get headaches, or have difficulty concentrating. The withdrawal symptoms are only temporary. They are strongest when you first quit, but they will go away within 10-14 days. To reduce the chances of relapse, try to:  Avoid drinking alcohol. Drinking lowers your chances of successfully quitting.  Reduce the amount of caffeine you consume. Once you quit smoking, the amount of caffeine in your body increases and can give you symptoms, such as a rapid heartbeat, sweating, and anxiety.  Avoid smokers because they can make you want to smoke.  Do not let weight gain distract you. Many smokers will gain weight when they quit, usually less than 10 pounds. Eat a healthy diet and stay active. You can always lose the weight gained after you quit.  Find ways to improve your mood other than smoking. FOR MORE INFORMATION  www.smokefree.gov  Document Released: 05/31/2001 Document Revised: 10/21/2013 Document Reviewed: 09/15/2011 [facility] Patient Information 2015 [place], [place]. This information  is not intended to replace advice given to you by your health care provider. Make sure you discuss any questions you have with your health care provider.

## 2014-01-23 ENCOUNTER — Encounter: Payer: Self-pay | Admitting: Internal Medicine

## 2014-01-23 DIAGNOSIS — IMO0001 Reserved for inherently not codable concepts without codable children: Secondary | ICD-10-CM | POA: Insufficient documentation

## 2014-01-23 NOTE — Progress Notes (Signed)
Patient ID: Peter Cox, male   DOB: 07/30/1960, 53 y.o.   MRN: [id number]   Subjective:   HPI: [name] is a 53 y.o. gentleman with past medical history of hypertension, remote history of a cocaine abuse, alcohol abuse, and current tobacco smoking, mild cognitive impairment with memory loss, HIV infection, and history of CVAs, presents to the clinic with his girlfriend for a routine followup visit.  Hand Trauma: [name] has been feeling well lately. However, he had a nail stick in the base of his left third finger a week ago. This left a wound which is steadily improving. No active bleeding or pus drainage. He denies constitutional symptoms - like, fever, chills, fatigue, nausea, or vomiting. The pain is minimal. He has no documented Tdap vaccination.  Lipoma: His girlfriend asks me to look at a lump at his left inner thigh was been present for several years. The mass is painless, not increasing in size, and does not give him any discomfort. He has no other masses in any other area of his body.  He still smokes and is not ready to quit today.    Kindly see the A&P for the status of the pt's chronic medical problems.   Review of Systems: Constitutional: Denies fever, chills, diaphoresis, appetite change and fatigue.  Respiratory: Denies SOB, DOE, cough, chest tightness, and wheezing.  Cardiovascular: No chest pain, palpitations and leg swelling.  Gastrointestinal: No abdominal pain, nausea, vomiting, bloody stools Genitourinary: No dysuria, frequency, hematuria, or flank pain.  Musculoskeletal: No myalgias, back pain, joint swelling, arthralgias.   Past Medical History  Diagnosis Date  . Hypertension   . Cocaine abuse      clean since about 2000  . Marijuana abuse   . Alcohol abuse     heavy, clean since April  . Tobacco abuse   . Mild cognitive impairment     likely vascular dementia/psa/?HIV componentCT Head 02/12/08 for htn crisis and concern for stroke:  impression-1. Old right internal capsule and thalamic lacunar infarcts. 2. Mild to moderate chronic small vessell white matter ischemic changes in both cerebral hemisphere, greater on the right  . HIV infection     dx 08/08/2008 (unknown how he was exposed)  Initial VL 43,200 and CD4 410 on 08/28/08  . Hyperlipidemia   . Neuromuscular disorder   . Stroke    Current Outpatient Prescriptions  Medication Sig Dispense Refill  . abacavir-lamiVUDine ([name]) 600-300 MG per tablet Take 1 tablet by mouth daily.  30 tablet  5  . amLODipine (NORVASC) 10 MG tablet Take 1 tablet (10 mg total) by mouth daily.  30 tablet  12  . aspirin 81 MG tablet Take 1 tablet (81 mg total) by mouth daily.      . Darunavir Ethanolate (PREZISTA) 800 MG tablet Take 1 tablet (800 mg total) by mouth daily.  30 tablet  5  . ritonavir (NORVIR) 100 MG TABS tablet Take 1 tablet (100 mg total) by mouth daily.  30 tablet  5  . triamcinolone ointment (KENALOG) 0.5 % Apply topically 2 (two) times daily.  30 g  1  . pravastatin (PRAVACHOL) 40 MG tablet Take 1 tablet (40 mg total) by mouth at bedtime.  60 tablet  6   No current facility-administered medications for this visit.   Family History  Problem Relation Age of Onset  . Diabetes Mother   . Hypertension Mother   . Stroke Mother   . Diabetes Father   . Hypertension  Sister   . Hypertension Sister    History   Social History  . Marital Status: Single    Spouse Name: N/A    Number of Children: N/A  . Years of Education: N/A   Social History Main Topics  . Smoking status: Current Every Day Smoker -- 0.10 packs/day for 40 years    Types: Cigarettes  . Smokeless tobacco: Never Used     Comment: Quit  x 2 week.  . Alcohol Use: No     Comment: last drink 2-3 months ago.  . Drug Use: 7.00 per week    Special: Marijuana  . Sexual Activity: Not on file   Other Topics Concern  . Not on file   Social History Narrative   Lives in [place] with male friend Peter Cox), and her family.  They have been together x 27yrs but are no longer sexually active.  Not working. 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 a grown daughter     Objective:  Physical Exam: Blood pressure 126/85, pulse rate 60, afebrile. General: Well nourished. No acute distress.  Lungs: CTA bilaterally. Heart: RRR; no extra sounds or murmurs  Abdomen: Non-distended, normal BS, soft, nontender; no hepatosplenomegaly  Left hand: There is a small (about 0.4 mm) clean wound at the palmar aspect of the left third finger. Looks clean. Then, the bleeding. Minimally tender. No pus, drainage. Normal range of motion of all interphalangeal joints of the finger. Extremities: No pedal edema. Noted a 2.5 cm subcutaneous mass that feels soft, freely mobile, located at the medial aspect of the left thigh. Its surface is smooth and it is nontender. Neurologic: Alert and oriented x3. No obvious neurologic deficits.  Assessment & Plan:  I have discussed my assessment and plan  with Dr. Eppie Gibson  as detailed under problem based charting.

## 2014-01-23 NOTE — Progress Notes (Signed)
Case discussed with Dr. Kazibwe soon after the resident saw the patient.  We reviewed the resident's history and exam and pertinent patient test results.  I agree with the assessment, diagnosis, and plan of care documented in the resident's note. 

## 2014-01-23 NOTE — Assessment & Plan Note (Signed)
Assessment: Most likely diagnosis: Lipoma in view of clinical history and physical examination findings of a likely benign subcutaneous soft tissue mass.  Ddx: I do not suspect malignancy.   Plan: 1. Labs/imaging: none 2. Therapy: none - patient has no symptoms. Reassured him. 3. Follow up : Only as needed

## 2014-01-23 NOTE — Assessment & Plan Note (Signed)
No signs of infection. The traumatic wound seems to be improving steadily. No specific treatment at this time. Counseled him to keep the wound clean and to call the clinic in case of fevers, chills, or signs of infection, like pus drainage from the wound. Will give a tetanus shot today.

## 2014-01-23 NOTE — Assessment & Plan Note (Signed)
I spent significant amount of time educating the patient about the risk of continued cigarette smoking, including COPD, lung cancer, coronary artery disease, among other health problems. The patient verbalizes understanding. He is not ready to quit today.

## 2014-01-23 NOTE — Assessment & Plan Note (Signed)
Will give a Tdap shot today.

## 2014-01-23 NOTE — Assessment & Plan Note (Signed)
Smoking cessation instruction/counseling given:  counseled patient on the dangers of tobacco use, advised patient to stop smoking, and reviewed strategies to maximize success 

## 2014-01-23 NOTE — Assessment & Plan Note (Signed)
Stable. 06/25/2013 CD4 count of 760 and Viral Load 49. Compliant with his medications. Will followup with the ID

## 2014-01-23 NOTE — Assessment & Plan Note (Signed)
BP Readings from Last 3 Encounters:  09/02/13 104/64  06/25/13 148/90  04/15/13 120/80    Lab Results  Component Value Date   NA 138 03/27/2013   K 3.9 03/27/2013   CREATININE 1.60* 03/27/2013    Assessment: Blood pressure control: controlled Progress toward BP goal:  at goal Comments: complaint with meds  Plan: Medications:  cont with amlodipine 10 mg daily  Educational resources provided:   Self management tools provided:   Other plans:  F/u in three months

## 2014-02-18 ENCOUNTER — Other Ambulatory Visit: Payer: Self-pay | Admitting: *Deleted

## 2014-02-18 DIAGNOSIS — I10 Essential (primary) hypertension: Secondary | ICD-10-CM

## 2014-02-18 NOTE — Telephone Encounter (Signed)
Pharmacy is requesting authorization to dispense 90 day supply. Hilda Blades Hermenegildo Clausen RN 02/18/14 2:30PM

## 2014-02-19 MED ORDER — AMLODIPINE BESYLATE 10 MG PO TABS: 10.0000 mg | ORAL_TABLET | Freq: Every day | ORAL | Status: DC

## 2014-03-10 ENCOUNTER — Ambulatory Visit: Payer: Medicare Other | Admitting: Infectious Disease

## 2014-03-24 ENCOUNTER — Other Ambulatory Visit (HOSPITAL_COMMUNITY)
Admission: RE | Admit: 2014-03-24 | Discharge: 2014-03-24 | Disposition: A | Discharge status: Home / Self Care | Payer: Medicare Other | Source: Ambulatory Visit | Attending: Infectious Disease | Admitting: Infectious Disease

## 2014-03-24 ENCOUNTER — Encounter: Payer: Self-pay | Admitting: Infectious Disease

## 2014-03-24 ENCOUNTER — Ambulatory Visit (INDEPENDENT_AMBULATORY_CARE_PROVIDER_SITE_OTHER): Payer: Medicare Other | Admitting: Infectious Disease

## 2014-03-24 VITALS — BP 171/102 | HR 61 | Temp 97.8°F | Wt 128.0 lb

## 2014-03-24 DIAGNOSIS — F32A Depression, unspecified: Secondary | ICD-10-CM

## 2014-03-24 DIAGNOSIS — I639 Cerebral infarction, unspecified: Secondary | ICD-10-CM

## 2014-03-24 DIAGNOSIS — I10 Essential (primary) hypertension: Secondary | ICD-10-CM

## 2014-03-24 DIAGNOSIS — F329 Major depressive disorder, single episode, unspecified: Secondary | ICD-10-CM

## 2014-03-24 DIAGNOSIS — Z113 Encounter for screening for infections with a predominantly sexual mode of transmission: Secondary | ICD-10-CM | POA: Diagnosis present

## 2014-03-24 DIAGNOSIS — Z23 Encounter for immunization: Secondary | ICD-10-CM

## 2014-03-24 DIAGNOSIS — B20 Human immunodeficiency virus [HIV] disease: Secondary | ICD-10-CM

## 2014-03-24 DIAGNOSIS — F17200 Nicotine dependence, unspecified, uncomplicated: Secondary | ICD-10-CM

## 2014-03-24 LAB — CBC WITH DIFFERENTIAL/PLATELET
Basophils Absolute: 0.1 10*3/uL (ref 0.0–0.1)
Basophils Relative: 1 % (ref 0–1)
Eosinophils Absolute: 0.1 10*3/uL (ref 0.0–0.7)
Eosinophils Relative: 2 % (ref 0–5)
HCT: 40 % (ref 39.0–52.0)
Hemoglobin: 13.5 g/dL (ref 13.0–17.0)
Lymphocytes Relative: 24 % (ref 12–46)
Lymphs Abs: 1.3 10*3/uL (ref 0.7–4.0)
MCH: 30.7 pg (ref 26.0–34.0)
MCHC: 33.8 g/dL (ref 30.0–36.0)
MCV: 90.9 fL (ref 78.0–100.0)
Monocytes Absolute: 0.3 10*3/uL (ref 0.1–1.0)
Monocytes Relative: 5 % (ref 3–12)
Neutro Abs: 3.7 10*3/uL (ref 1.7–7.7)
Neutrophils Relative %: 68 % (ref 43–77)
Platelets: 195 10*3/uL (ref 150–400)
RBC: 4.4 MIL/uL (ref 4.22–5.81)
RDW: 14.6 % (ref 11.5–15.5)
WBC: 5.5 10*3/uL (ref 4.0–10.5)

## 2014-03-24 MED ORDER — ESCITALOPRAM OXALATE 10 MG PO TABS: 10.0000 mg | ORAL_TABLET | Freq: Every day | ORAL | Status: DC

## 2014-03-24 NOTE — Progress Notes (Signed)
Subjective:    Patient ID: Peter Cox, male    DOB: 1960/10/04, 53 y.o.   MRN: [id number]  HPI  53  year old with hx HTN, smoking and HIV is better controlled now back on Prezista, Norvir and [name] with VL well controlled and healthy CD4 count when last checked in January.  Lab Results  Component Value Date   HIV1RNAQUANT 49* 06/25/2013   Lab Results  Component Value Date   CD4TABS 760 06/25/2013   CD4TABS 590 03/27/2013   CD4TABS 470 12/31/2012   He says he misses his ARVs once a week but no more frequently than that.     Review of Systems  Constitutional: Negative for chills, diaphoresis, activity change, appetite change, fatigue and unexpected weight change.  HENT: Negative for congestion, rhinorrhea, sinus pressure, sneezing, sore throat and trouble swallowing.   Eyes: Negative for photophobia and visual disturbance.  Respiratory: Negative for cough, chest tightness, shortness of breath, wheezing and stridor.   Cardiovascular: Negative for chest pain, palpitations and leg swelling.  Gastrointestinal: Negative for nausea, vomiting, abdominal pain, diarrhea, constipation, blood in stool, abdominal distention and anal bleeding.  Genitourinary: Negative for dysuria, hematuria, flank pain and difficulty urinating.  Musculoskeletal: Positive for arthralgias. Negative for back pain, gait problem, joint swelling and myalgias.  Skin: Negative for pallor, rash and wound.  Neurological: Negative for dizziness, tremors, weakness and light-headedness.  Hematological: Negative for adenopathy. Does not bruise/bleed easily.  Psychiatric/Behavioral: Positive for sleep disturbance, dysphoric mood and decreased concentration. Negative for suicidal ideas, hallucinations, behavioral problems, confusion, self-injury and agitation. The patient is nervous/anxious. The patient is not hyperactive.        Objective:   Physical Exam  Constitutional: He is oriented to person, place, and time. No  distress.  HENT:  Head: Normocephalic and atraumatic.  Mouth/Throat: Oropharynx is clear and moist. No oropharyngeal exudate.  Eyes: Conjunctivae and EOM are normal. No scleral icterus.  Neck: Normal range of motion. Neck supple.  Cardiovascular: Normal rate, regular rhythm and normal heart sounds.  Exam reveals no gallop and no friction rub.   No murmur heard. Pulmonary/Chest: Effort normal and breath sounds normal. No respiratory distress. He has no wheezes. He has no rales.  Abdominal: He exhibits no distension. There is no tenderness. There is no rebound.  Musculoskeletal:       Left shoulder: He exhibits tenderness.       Legs: Neurological: He is alert and oriented to person, place, and time. He exhibits normal muscle tone. Coordination normal.  Skin: Skin is warm and dry. He is not diaphoretic. No erythema. No pallor.  Psychiatric: His behavior is normal. Judgment and thought content normal. His speech is delayed. He exhibits a depressed mood.  Quiet, more talkative today than usual.          Assessment & Plan:   #1HIV: continue current regimen, check labs today  #2 HTN: continue amlodipine but his BP high today. Claims he is taking meds. Asked him to bring them with him next time  #3 hx of CVA: continue statin, bp control  #4 Hyperlipdemia: continue statin   #5 Substance abuse etohism per wife better more issue with THC intoxication in the past  #6 smoking: > 3 minutes counsellign re smoking cessation    #7 HIV dementia: doing better with structure  #8 Depression: NEW Problem: will start lexapro 5 and escalate to 10mg . Would like him to see our counselor here. He has no SI and is contracted for  safety

## 2014-03-25 LAB — COMPLETE METABOLIC PANEL WITH GFR
ALT: 11 U/L (ref 0–53)
AST: 13 U/L (ref 0–37)
Albumin: 4 g/dL (ref 3.5–5.2)
Alkaline Phosphatase: 71 U/L (ref 39–117)
BUN: 20 mg/dL (ref 6–23)
CO2: 29 mEq/L (ref 19–32)
Calcium: 8.8 mg/dL (ref 8.4–10.5)
Chloride: 106 mEq/L (ref 96–112)
Creat: 1.28 mg/dL (ref 0.50–1.35)
GFR, Est African American: 72 mL/min
GFR, Est Non African American: 63 mL/min
Glucose, Bld: 70 mg/dL (ref 70–99)
Potassium: 4.4 mEq/L (ref 3.5–5.3)
Sodium: 142 mEq/L (ref 135–145)
Total Bilirubin: 0.4 mg/dL (ref 0.2–1.2)
Total Protein: 6.7 g/dL (ref 6.0–8.3)

## 2014-03-25 LAB — T-HELPER CELL (CD4) - (RCID CLINIC ONLY)
CD4 % Helper T Cell: 42 % (ref 33–55)
CD4 T Cell Abs: 650 /uL (ref 400–2700)

## 2014-03-25 LAB — MICROALBUMIN / CREATININE URINE RATIO
Creatinine, Urine: 253.5 mg/dL
Microalb Creat Ratio: 301 mg/g — ABNORMAL HIGH (ref 0.0–30.0)
Microalb, Ur: 76.3 mg/dL — ABNORMAL HIGH (ref <2.0)

## 2014-03-25 LAB — HIV-1 RNA ULTRAQUANT REFLEX TO GENTYP+
HIV 1 RNA Quant: <20 copies/mL (ref <20)
HIV-1 RNA Quant, Log: <1.3 {Log} (ref <1.30)

## 2014-03-25 LAB — URINE CYTOLOGY ANCILLARY ONLY
Chlamydia: NEGATIVE
Neisseria Gonorrhea: NEGATIVE

## 2014-03-25 LAB — RPR

## 2014-03-27 ENCOUNTER — Ambulatory Visit: Payer: Medicare Other

## 2014-04-30 ENCOUNTER — Encounter: Payer: Self-pay | Admitting: Internal Medicine

## 2014-04-30 ENCOUNTER — Ambulatory Visit (INDEPENDENT_AMBULATORY_CARE_PROVIDER_SITE_OTHER): Payer: Commercial Managed Care - HMO | Admitting: Internal Medicine

## 2014-04-30 VITALS — BP 138/87 | HR 65 | Temp 97.7°F | Resp 20 | Ht 64.5 in | Wt 129.1 lb

## 2014-04-30 DIAGNOSIS — B20 Human immunodeficiency virus [HIV] disease: Secondary | ICD-10-CM

## 2014-04-30 DIAGNOSIS — F329 Major depressive disorder, single episode, unspecified: Secondary | ICD-10-CM

## 2014-04-30 DIAGNOSIS — I10 Essential (primary) hypertension: Secondary | ICD-10-CM

## 2014-04-30 DIAGNOSIS — F32A Depression, unspecified: Secondary | ICD-10-CM

## 2014-04-30 DIAGNOSIS — Z720 Tobacco use: Secondary | ICD-10-CM

## 2014-04-30 DIAGNOSIS — F17200 Nicotine dependence, unspecified, uncomplicated: Secondary | ICD-10-CM

## 2014-04-30 DIAGNOSIS — Z0000 Encounter for general adult medical examination without abnormal findings: Secondary | ICD-10-CM

## 2014-04-30 NOTE — Assessment & Plan Note (Signed)
Continues to smoke. Not ready to quit today.

## 2014-04-30 NOTE — Assessment & Plan Note (Signed)
Well controlled. He reports compliance with his medications.  Plan follow up with ID

## 2014-04-30 NOTE — Patient Instructions (Signed)
General Instructions: I will refer you for a colonoscopy Please take your medications as before Please come back in 3 months  Thank you for bringing your medicines today. This helps [name] keep you safe from mistakes.   Progress Toward Treatment Goals:  Treatment Goal 04/30/2014  Blood pressure at goal  Stop smoking -  Prevent falls at goal    Self Care Goals & Plans:  Self Care Goal 04/30/2014  Manage my medications take my medicines as prescribed; bring my medications to every visit; refill my medications on time  Monitor my health keep track of my blood pressure  Eat healthy foods eat more vegetables; drink diet soda or water instead of juice or soda  Be physically active find an activity I enjoy  Stop smoking cut down the number of cigarettes smoked    No flowsheet data found.   Care Management & Community Referrals:  Referral 04/30/2014  Referrals made for care management support none needed

## 2014-04-30 NOTE — Assessment & Plan Note (Signed)
BP Readings from Last 3 Encounters:  04/30/14 138/87  03/24/14 171/102  09/02/13 104/64    Lab Results  Component Value Date   NA 142 03/24/2014   K 4.4 03/24/2014   CREATININE 1.28 03/24/2014    Assessment: Blood pressure control: controlled Progress toward BP goal:  at goal Comments: On Amlodipine 10 mg daily  Plan: Medications:  continue current medications Educational resources provided: brochure Self management tools provided: instructions for home blood pressure monitoring Other plans: follow up in three months

## 2014-04-30 NOTE — Progress Notes (Signed)
Patient ID: Peter Cox, male   DOB: Oct 21, 1960, 53 y.o.   MRN: [id number]   Subjective:   HPI: Peter Cox is a 53 y.o. gentleman with past medical history of hypertension, remote history of a cocaine abuse, alcohol abuse, and current tobacco smoking, mild cognitive impairment with memory loss, HIV infection, and history of CVAs, presents to the clinic with his friend for a routine followup visit.  He has been feeling well lately.   Only complaint is a productive cough which has been present for several days. It's getting better. Denies chest pain, shortness of breath, fevers, or increased fatigue. He wants to try Nyquil which I encouraged.   ROS: Constitutional: Denies fever, chills, diaphoresis, appetite change and fatigue.  Respiratory: Denies chest tightness, and wheezing. Denies chest pain. CVS: No chest pain, palpitations and leg swelling.  GI: No abdominal pain, nausea, vomiting, bloody stools GU: No dysuria, frequency, hematuria, or flank pain.  MSK: No myalgias, back pain, joint swelling, arthralgias  Psych: recently started on treatment for depression with Lexapro but he has not started taking the pills even thought he filled it. No SI or SA.    Objective:  Physical Exam: Filed Vitals:   04/30/14 1058  BP: 138/87  Pulse: 65  Temp: 97.7 F (36.5 C)  TempSrc: Oral  Resp: 20  Height: 5' 4.5" (1.638 m)  Weight: 129 lb 1.6 oz (58.559 kg)  SpO2: 100%   General: Well nourished. No acute distress.  HEENT: Normal oral mucosa. MMM.  Lungs: CTA bilaterally. Heart: RRR; no extra sounds or murmurs  Abdomen: Non-distended, normal bowel sounds, soft, nontender; no hepatosplenomegaly  Extremities: No pedal edema. No joint swelling or tenderness. Neurologic: Normal EOM,  Alert and oriented x3. No obvious neurologic/cranial nerve deficits.  Assessment & Plan:  Discussed case with my attending in the clinic, Dr. Dareen Piano. See problem based charting.

## 2014-04-30 NOTE — Assessment & Plan Note (Signed)
Patient would like to try Lexapro which was prescribed by Dr. Tommy Medal.  Plan Lexapro 10 mg every day for 14 days and then 20 mg daily.

## 2014-04-30 NOTE — Assessment & Plan Note (Signed)
Referral to gastroenterologist for a screening colonoscopy.

## 2014-05-04 NOTE — Progress Notes (Signed)
INTERNAL MEDICINE TEACHING ATTENDING ADDENDUM - Yashua Bracco, MD: I reviewed and discussed at the time of visit with the resident Dr. Kazibwe, the patient's medical history, physical examination, diagnosis and results of pertinent tests and treatment and I agree with the patient's care as documented.  

## 2014-06-23 ENCOUNTER — Encounter: Payer: Self-pay | Admitting: *Deleted

## 2014-06-25 ENCOUNTER — Ambulatory Visit (AMBULATORY_SURGERY_CENTER): Payer: Self-pay | Admitting: *Deleted

## 2014-06-25 VITALS — Ht 65.0 in | Wt 133.0 lb

## 2014-06-25 DIAGNOSIS — Z1211 Encounter for screening for malignant neoplasm of colon: Secondary | ICD-10-CM

## 2014-06-25 NOTE — Progress Notes (Signed)
Patient denies any allergies to eggs or soy. Patient denies any problems with anesthesia/sedation. Patient denies any oxygen use at home and does not take any diet/weight loss medications. EMMI education assisgned to patient on colonoscopy, this was explained and instructions given to patient. "friend" at side during pre-visit prep instructions. She states she will help him with prep.

## 2014-06-30 ENCOUNTER — Encounter: Payer: Self-pay | Admitting: Internal Medicine

## 2014-06-30 ENCOUNTER — Telehealth: Payer: Self-pay | Admitting: Internal Medicine

## 2014-06-30 DIAGNOSIS — Z1211 Encounter for screening for malignant neoplasm of colon: Secondary | ICD-10-CM

## 2014-06-30 MED ORDER — POLYETHYLENE GLYCOL 3350 17 GM/SCOOP PO POWD: ORAL | Status: DC

## 2014-06-30 MED ORDER — BISACODYL EC 5 MG PO TBEC: DELAYED_RELEASE_TABLET | ORAL | Status: DC

## 2014-06-30 NOTE — Telephone Encounter (Signed)
Spoke with patient's care giver, "Alice". She states she needs the prep medications sent to pt.'s Pharmacy because the pharmacy told her it would be paid for because he has Medicaid. Sent Miralax and Dulcolax to [facility] per her request. Explained to her that she may get this medication over the counter, she understands. Also mailed the instructions to the patient's address per her request. She states she will be helping him get ready. No further questions at this time.

## 2014-06-30 NOTE — Telephone Encounter (Signed)
Call back patient. No answer on his mobile #, left message from him to call me back direct.

## 2014-07-02 ENCOUNTER — Encounter: Payer: Self-pay | Admitting: Infectious Disease

## 2014-07-02 DIAGNOSIS — F0281 Dementia in other diseases classified elsewhere with behavioral disturbance: Secondary | ICD-10-CM

## 2014-07-02 DIAGNOSIS — B20 Human immunodeficiency virus [HIV] disease: Secondary | ICD-10-CM | POA: Insufficient documentation

## 2014-07-02 DIAGNOSIS — F02818 Dementia in other diseases classified elsewhere, unspecified severity, with other behavioral disturbance: Secondary | ICD-10-CM | POA: Insufficient documentation

## 2014-07-02 DIAGNOSIS — F03918 Unspecified dementia, unspecified severity, with other behavioral disturbance: Secondary | ICD-10-CM | POA: Insufficient documentation

## 2014-07-02 DIAGNOSIS — F0390 Unspecified dementia without behavioral disturbance: Secondary | ICD-10-CM | POA: Insufficient documentation

## 2014-07-02 DIAGNOSIS — Z8673 Personal history of transient ischemic attack (TIA), and cerebral infarction without residual deficits: Secondary | ICD-10-CM | POA: Insufficient documentation

## 2014-07-04 ENCOUNTER — Telehealth: Payer: Self-pay | Admitting: Internal Medicine

## 2014-07-04 NOTE — Telephone Encounter (Signed)
1145 am called and got Alice Jone's VM. [place]. Lelan Pons PV

## 2014-07-04 NOTE — Telephone Encounter (Signed)
Ms. [name] calling because they lost their instruction packet given at pre visit appointment last week. Ms.Jones has picked up the prep from the pharmacy ( dulcolax, miralax) but needs instructions on how to use this.  Instructed her from today stop the foods he cannot have like beans, peas, etc. Then Tuesday 1-19 clear liquids only and given the list of clear liquids Mr Johannessen is allowed. Instructed her to give the dulcolax at 3 pm and 7 pm Tuesday 1-19 and then to mix the whole bottle of miarlax with a 64 oz bottle of gatorade not red or purple Tuesday am, place in frig  and have Mr Beger drink half of this mixture at 5 pm Tuesday 07-08-14.  Then Wednesday morning 1-20 he is to only have clear liquids and to drink the other half of the miralax gatorade at 630 am and then stop all liquids , gum, candy by 0830 am Wednesday am.  These specific instructions given to Mrs [name] multiple times. I offered for her to come and get these again today and she states she doesn't have gas money to get to [employer] today. I did however, print Mr Leconte" prep instructions and place them in the mail. I did tell [name] that I  am not sure if she will receive these prior to his procedure on Wednesday so if she has any further questions she needs to call us. Alice verbalized understanding, she did repeat back to me these instructions with dates and times. She asked if Mr Verducci could have orange juice and green juice and I explained to her he can until Tuesday but neither after Monday that Tuesday he can only have clear liquids and this list  given to her again until after his procedure on Wednesday. We discussed his medications must be given before 0830 am Wednesday morning or he can take these after his procedure but nothing after 830 am. She returned understanding of this.  Lelan Pons PV

## 2014-07-09 ENCOUNTER — Ambulatory Visit (AMBULATORY_SURGERY_CENTER): Payer: 59 | Admitting: Internal Medicine

## 2014-07-09 ENCOUNTER — Encounter: Payer: Self-pay | Admitting: Internal Medicine

## 2014-07-09 VITALS — BP 186/98 | HR 59 | Temp 97.7°F | Resp 16 | Ht 65.0 in | Wt 133.0 lb

## 2014-07-09 DIAGNOSIS — Z8673 Personal history of transient ischemic attack (TIA), and cerebral infarction without residual deficits: Secondary | ICD-10-CM | POA: Diagnosis not present

## 2014-07-09 DIAGNOSIS — I10 Essential (primary) hypertension: Secondary | ICD-10-CM | POA: Diagnosis not present

## 2014-07-09 DIAGNOSIS — K621 Rectal polyp: Secondary | ICD-10-CM | POA: Diagnosis not present

## 2014-07-09 DIAGNOSIS — K5730 Diverticulosis of large intestine without perforation or abscess without bleeding: Secondary | ICD-10-CM | POA: Diagnosis not present

## 2014-07-09 DIAGNOSIS — Z1211 Encounter for screening for malignant neoplasm of colon: Secondary | ICD-10-CM

## 2014-07-09 DIAGNOSIS — K648 Other hemorrhoids: Secondary | ICD-10-CM

## 2014-07-09 DIAGNOSIS — D128 Benign neoplasm of rectum: Secondary | ICD-10-CM

## 2014-07-09 MED ORDER — SODIUM CHLORIDE 0.9 % IV SOLN: 500.0000 mL | INTRAVENOUS | Status: DC

## 2014-07-09 NOTE — Progress Notes (Signed)
Called to room to assist during endoscopic procedure.  Patient ID and intended procedure confirmed with present staff. Received instructions for my participation in the procedure from the performing physician.  

## 2014-07-09 NOTE — Progress Notes (Signed)
Spoke w/ carepartner ,Alice since pt not very clear on times ttok meds or ate and drank last. Pt says he is sure colon is cleaned out and that he is sure did not drink anything after 9 am this morning. Admits to eating apple sauce 4 spoons full @ 6:30 this am & chicken noodle soup yesterday before noon. Dr Carlean Purl & J Monday CRNA made aware of this.

## 2014-07-09 NOTE — Op Note (Signed)
[facility]  [address] [place], [id number]   COLONOSCOPY PROCEDURE REPORT  PATIENT: Peter Cox, Peter Cox  MR#: [id number] BIRTHDATE: [birth year] , 56  yrs. old GENDER: male ENDOSCOPIST: Gatha Mayer, MD, [facility] PROCEDURE DATE:  07/09/2014 PROCEDURE:   Colonoscopy with snare polypectomy First Screening Colonoscopy - Avg.  risk and is 50 yrs.  old or older Yes.  Prior Negative Screening - Now for repeat screening. N/A  History of Adenoma - Now for follow-up colonoscopy & has been > or = to 3 yrs.  N/A  Polyps Removed Today? Yes. ASA CLASS:   Class III INDICATIONS:first colonoscopy and average risk for colorectal cancer. MEDICATIONS: Propofol 200 mg IV and Monitored anesthesia care  DESCRIPTION OF PROCEDURE:   After the risks benefits and alternatives of the procedure were thoroughly explained, informed consent was obtained.  The digital rectal exam revealed no abnormalities of the rectum, revealed no prostatic nodules, and revealed the prostate was not enlarged.   The LB [id number] endoscope was introduced through the anus and advanced to the cecum, which was identified by both the appendix and ileocecal valve. No adverse events experienced.   The quality of the prep was excellent, using MiraLax  The instrument was then slowly withdrawn as the colon was fully examined.      COLON FINDINGS: 1) Diminutive rectal polyp removed by cold snare polypectomy and sent to pathology.  2) Sigmoid diverticulosis.  3) Internal hemorrhoids.  4) Otherwise normal colon to cecum. Retroflexed views revealed internal hemorrhoids. The time to cecum=2 minutes 21 seconds.  Withdrawal time=10 minutes 14 seconds. The scope was withdrawn and the procedure completed. COMPLICATIONS: There were no immediate complications.  ENDOSCOPIC IMPRESSION: 1) Diminutive rectal polyp removed by cold snare polypectomy and sent to pathology. 2) Sigmoid diverticulosis. 3) Internal  hemorrhoids. 4) Otherwise normal colon to cecum - excellent prep  RECOMMENDATIONS: Timing of repeat colonoscopy will be determined by pathology findings.  eSigned:  Gatha Mayer, MD, [facility] 07/09/2014 11:37 AM   cc: Jessee Avers, MD and The Patient

## 2014-07-09 NOTE — Patient Instructions (Addendum)
I found and removed 1 tiny rectal polyp that looks benign. You also have a condition called diverticulosis - common and not usually a problem. Please read the handout provided. Also have some internal hemorrhoids - not usually a problem.  I will let you know pathology results and when to have another routine colonoscopy by mail. Gatha Mayer, MD, FACG  YOU HAD AN ENDOSCOPIC PROCEDURE TODAY AT [facility]: Refer to the procedure report that was given to you for any specific questions about what was found during the examination.  If the procedure report does not answer your questions, please call your gastroenterologist to clarify.  If you requested that your care partner not be given the details of your procedure findings, then the procedure report has been included in a sealed envelope for you to review at your convenience later.  YOU SHOULD EXPECT: Some feelings of bloating in the abdomen. Passage of more gas than usual.  Walking can help get rid of the air that was put into your GI tract during the procedure and reduce the bloating. If you had a lower endoscopy (such as a colonoscopy or flexible sigmoidoscopy) you may notice spotting of blood in your stool or on the toilet paper. If you underwent a bowel prep for your procedure, then you may not have a normal bowel movement for a few days.  DIET: Your first meal following the procedure should be a light meal and then it is ok to progress to your normal diet.  A half-sandwich or bowl of soup is an example of a good first meal.  Heavy or fried foods are harder to digest and may make you feel nauseous or bloated.  Likewise meals heavy in dairy and vegetables can cause extra gas to form and this can also increase the bloating.  Drink plenty of fluids but you should avoid alcoholic beverages for 24 hours.  ACTIVITY: Your care partner should take you home directly after the procedure.  You should plan to take it easy, moving  slowly for the rest of the day.  You can resume normal activity the day after the procedure however you should NOT DRIVE or use heavy machinery for 24 hours (because of the sedation medicines used during the test).    SYMPTOMS TO REPORT IMMEDIATELY: A gastroenterologist can be reached at any hour.  During normal business hours, 8:30 AM to 5:00 PM Monday through Friday, call [phone].  After hours and on weekends, please call the GI answering service at [phone] who will take a message and have the physician on call contact you.   Following lower endoscopy (colonoscopy or flexible sigmoidoscopy):  Excessive amounts of blood in the stool  Significant tenderness or worsening of abdominal pains  Swelling of the abdomen that is new, acute  Fever of 100F or higher  FOLLOW UP: If any biopsies were taken you will be contacted by phone or by letter within the next 1-3 weeks.  Call your gastroenterologist if you have not heard about the biopsies in 3 weeks.  Our staff will call the home number listed on your records the next business day following your procedure to check on you and address any questions or concerns that you may have at that time regarding the information given to you following your procedure. This is a courtesy call and so if there is no answer at the home number and we have not heard from you through the emergency physician on call,  we will assume that you have returned to your regular daily activities without incident.  SIGNATURES/CONFIDENTIALITY: You and/or your care partner have signed paperwork which will be entered into your electronic medical record.  These signatures attest to the fact that that the information above on your After Visit Summary has been reviewed and is understood.  Full responsibility of the confidentiality of this discharge information lies with you and/or your care-partner.  Recommendations Discharge instructions given to patient/care partner.    Polyp, diverticulosis, and hemorrhoid handouts provided to patient/care partner.

## 2014-07-09 NOTE — Progress Notes (Signed)
Report to PACU, RN, vss, BBS= Clear.  

## 2014-07-10 ENCOUNTER — Other Ambulatory Visit: Payer: Self-pay | Admitting: Internal Medicine

## 2014-07-10 ENCOUNTER — Telehealth: Payer: Self-pay | Admitting: *Deleted

## 2014-07-10 DIAGNOSIS — B20 Human immunodeficiency virus [HIV] disease: Secondary | ICD-10-CM

## 2014-07-10 NOTE — Telephone Encounter (Signed)
  Follow up Call-  Call back number 07/09/2014  Post procedure Call Back phone  # [phone]  Permission to leave phone message Yes    [facility]

## 2014-07-17 ENCOUNTER — Encounter: Payer: Self-pay | Admitting: Internal Medicine

## 2014-07-17 NOTE — Progress Notes (Signed)
Quick Note:  Prolapse type polyp Repeat colon 10 years ______

## 2014-07-22 ENCOUNTER — Other Ambulatory Visit: Payer: Self-pay | Admitting: Internal Medicine

## 2014-08-05 ENCOUNTER — Encounter: Payer: Commercial Managed Care - HMO | Admitting: Internal Medicine

## 2014-08-06 ENCOUNTER — Encounter: Payer: Commercial Managed Care - HMO | Admitting: Internal Medicine

## 2014-08-06 ENCOUNTER — Ambulatory Visit: Payer: Commercial Managed Care - HMO | Admitting: Infectious Disease

## 2014-08-07 ENCOUNTER — Encounter: Payer: Self-pay | Admitting: Internal Medicine

## 2014-08-07 ENCOUNTER — Ambulatory Visit (INDEPENDENT_AMBULATORY_CARE_PROVIDER_SITE_OTHER): Payer: 59 | Admitting: Internal Medicine

## 2014-08-07 VITALS — BP 211/109 | HR 60 | Temp 97.7°F | Ht 65.0 in | Wt 131.2 lb

## 2014-08-07 DIAGNOSIS — Z21 Asymptomatic human immunodeficiency virus [HIV] infection status: Secondary | ICD-10-CM

## 2014-08-07 DIAGNOSIS — F17200 Nicotine dependence, unspecified, uncomplicated: Secondary | ICD-10-CM

## 2014-08-07 DIAGNOSIS — I10 Essential (primary) hypertension: Secondary | ICD-10-CM

## 2014-08-07 DIAGNOSIS — B20 Human immunodeficiency virus [HIV] disease: Secondary | ICD-10-CM

## 2014-08-07 NOTE — Assessment & Plan Note (Signed)
Patient has well-controlled HIV with a CD4 of 650 and a undetectable viral load. It does not seem that patient has a future appointment set with infectious disease clinic. -We'll try to set patient up with appointment with infectious disease.

## 2014-08-07 NOTE — Progress Notes (Signed)
   Subjective:    Patient ID: Peter Cox, male    DOB: 03-21-1961, 54 y.o.   MRN: [id number]  HPI  Patient is a 54 year old with a history of HIV, hypertension, depression, CVA, chronic kidney disease who presents to clinic for a routine follow-up.   Please refer to separate problem-list charting for more details.   Review of Systems  Constitutional: Negative for fever and chills.  HENT: Negative for sore throat.   Respiratory: Negative for shortness of breath.   Cardiovascular: Negative for chest pain and palpitations.  Gastrointestinal: Negative for nausea, vomiting, abdominal pain, diarrhea, constipation and blood in stool.  Genitourinary: Negative for dysuria and hematuria.  Skin: Negative for rash.  Neurological: Negative for syncope.  Psychiatric/Behavioral: Negative for suicidal ideas.       Objective:   Physical Exam  Constitutional: He is oriented to person, place, and time. He appears well-developed and well-nourished. No distress.  HENT:  Head: Normocephalic and atraumatic.  Funduscopic exam within normal limits  Eyes: EOM are normal. Pupils are equal, round, and reactive to light. No scleral icterus.  Neck: Normal range of motion. Neck supple. No thyromegaly present.  Cardiovascular: Normal rate and regular rhythm.  Exam reveals no gallop and no friction rub.   No murmur heard. Pulmonary/Chest: Effort normal and breath sounds normal. No respiratory distress. He has no wheezes. He has no rales.  Abdominal: Soft. Bowel sounds are normal. He exhibits no distension. There is no tenderness. There is no rebound.  Musculoskeletal: Normal range of motion. He exhibits no edema.  Neurological: He is alert and oriented to person, place, and time. No cranial nerve deficit.  5 out of 5 strength in all 4 extremities, sensation to light touch intact throughout all 4 extremities, 2+ reflexes throughout all 4 extremities  Skin: No rash noted.       Assessment & Plan:  Please  refer to separate problem-list charting for more details.

## 2014-08-07 NOTE — Patient Instructions (Signed)
Your blood pressure is at a very high level today at 211/109. We would ideally like to see this much lower. You have done a good job in controlling your blood pressure in the past. Please try to continue taking your amlodipine 10 mg daily over the next couple days. Because this may be a dangerous condition, we would like to see you back in clinic to make sure that her blood pressure is lower. If not, we may start a new medication at that point to try to lower your blood pressure. Please try to follow-up with infectious disease clinic for your HIV. Please also try to reduce the number of cigarettes that he smoke a day as this can also affect her blood pressure.  General Instructions:   Please bring your medicines with you each time you come to clinic.  Medicines may include prescription medications, over-the-counter medications, herbal remedies, eye drops, vitamins, or other pills.   Progress Toward Treatment Goals:  Treatment Goal 08/07/2014  Blood pressure deteriorated  Stop smoking -  Prevent falls -    Self Care Goals & Plans:  Self Care Goal 08/07/2014  Manage my medications take my medicines as prescribed; bring my medications to every visit; refill my medications on time  Monitor my health -  Eat healthy foods drink diet soda or water instead of juice or soda; eat more vegetables; eat foods that are low in salt; eat baked foods instead of fried foods; eat fruit for snacks and desserts; eat smaller portions  Be physically active -  Stop smoking -    No flowsheet data found.   Care Management & Community Referrals:  Referral 04/30/2014  Referrals made for care management support none needed

## 2014-08-07 NOTE — Assessment & Plan Note (Addendum)
BP Readings from Last 3 Encounters:  08/07/14 211/109  07/09/14 186/98  04/30/14 138/87    Lab Results  Component Value Date   NA 142 03/24/2014   K 4.4 03/24/2014   CREATININE 1.28 03/24/2014    Assessment: Blood pressure control: severely elevated Progress toward BP goal:  deteriorated Comments: Patient is asymptomatic today in clinic. Patient denies any neurological symptoms, nausea, vomiting, chest discomfort, neck pain, dyspnea, illicit drug use. Physical exam within normal limits. Patient has no focal neurological deficits. Funduscopic exam within normal limits. Patient states that he has been mostly compliant with his amlodipine 10 mg daily but missed his dose this morning. Patient had previously been on lisinopril 40 mg daily which was discontinued in 2014 in the context of acute kidney injury. Patient also has some noted low blood pressures in the system overall.  Plan: Medications:  continue current medications. Encourage patient to continue compliance with prescribed amlodipine 10 mg daily. Will hold off on adding another agent at this point given overall adequate blood pressure control on monotherapy throughout 2015. Educational resources provided:   Self management tools provided:   Other plans: Return to clinic on Monday, 08/11/2014 for a blood pressure recheck.

## 2014-08-07 NOTE — Assessment & Plan Note (Signed)
Patient states that he still smokes 5-7 cigarettes a day. He states that he is willing to try to reduce that amount by a couple cigarettes a day. -Counseled patient on the dangers of smoking especially in relation to his very high blood pressure. -Encourage patient to follow through with smoking reduction plan.

## 2014-08-08 NOTE — Progress Notes (Signed)
Internal Medicine Clinic Attending Date of visit: 08/07/2014  Case discussed with Dr. Raelene Bott at the time of the visit.  We reviewed the resident's history and exam and pertinent patient test results.  I agree with the assessment, diagnosis, and plan of care documented in the resident's note.

## 2014-08-11 ENCOUNTER — Encounter: Payer: Self-pay | Admitting: Internal Medicine

## 2014-08-11 ENCOUNTER — Other Ambulatory Visit: Payer: 59

## 2014-08-11 ENCOUNTER — Ambulatory Visit (INDEPENDENT_AMBULATORY_CARE_PROVIDER_SITE_OTHER): Payer: 59 | Admitting: Internal Medicine

## 2014-08-11 VITALS — BP 171/98 | HR 69 | Temp 97.9°F | Ht 65.0 in | Wt 132.5 lb

## 2014-08-11 DIAGNOSIS — Z720 Tobacco use: Secondary | ICD-10-CM

## 2014-08-11 DIAGNOSIS — Z113 Encounter for screening for infections with a predominantly sexual mode of transmission: Secondary | ICD-10-CM

## 2014-08-11 DIAGNOSIS — B20 Human immunodeficiency virus [HIV] disease: Secondary | ICD-10-CM | POA: Diagnosis not present

## 2014-08-11 DIAGNOSIS — I10 Essential (primary) hypertension: Secondary | ICD-10-CM

## 2014-08-11 DIAGNOSIS — F17200 Nicotine dependence, unspecified, uncomplicated: Secondary | ICD-10-CM

## 2014-08-11 LAB — CBC WITH DIFFERENTIAL/PLATELET
Basophils Absolute: 0 10*3/uL (ref 0.0–0.1)
Basophils Relative: 0 % (ref 0–1)
Eosinophils Absolute: 0.2 10*3/uL (ref 0.0–0.7)
Eosinophils Relative: 3 % (ref 0–5)
HCT: 44.9 % (ref 39.0–52.0)
Hemoglobin: 15 g/dL (ref 13.0–17.0)
Lymphocytes Relative: 25 % (ref 12–46)
Lymphs Abs: 1.4 10*3/uL (ref 0.7–4.0)
MCH: 30.6 pg (ref 26.0–34.0)
MCHC: 33.4 g/dL (ref 30.0–36.0)
MCV: 91.6 fL (ref 78.0–100.0)
MPV: 11.6 fL (ref 8.6–12.4)
Monocytes Absolute: 0.3 10*3/uL (ref 0.1–1.0)
Monocytes Relative: 6 % (ref 3–12)
Neutro Abs: 3.7 10*3/uL (ref 1.7–7.7)
Neutrophils Relative %: 66 % (ref 43–77)
Platelets: 198 10*3/uL (ref 150–400)
RBC: 4.9 MIL/uL (ref 4.22–5.81)
RDW: 14.3 % (ref 11.5–15.5)
WBC: 5.6 10*3/uL (ref 4.0–10.5)

## 2014-08-11 MED ORDER — LISINOPRIL 10 MG PO TABS: 10.0000 mg | ORAL_TABLET | Freq: Every day | ORAL | Status: DC

## 2014-08-11 NOTE — Patient Instructions (Signed)
Your blood pressure is doing better but is not exactly where we want to be. I have started a new medication called lisinopril which we will start at 10 mg daily. Please take this in addition to your amlodipine 10 mg daily. Please come back to see [name] in clinic in 2-3 weeks for another check and labs to monitor your kidneys.

## 2014-08-11 NOTE — Assessment & Plan Note (Addendum)
BP Readings from Last 3 Encounters:  08/11/14 171/98  08/07/14 211/109  07/09/14 186/98    Lab Results  Component Value Date   NA 142 03/24/2014   K 4.4 03/24/2014   CREATININE 1.28 03/24/2014    Assessment: Blood pressure control: moderately elevated Progress toward BP goal:  improved Comments: Patient states that he has been compliant with his amlodipine 10 mg over the last week. He denies any reactions to lisinopril that he used to be on back in 2012 and 2013. As mentioned in the previous visit, lisinopril had been discontinued in the context of acute kidney injury, the patient was on 40 mg at that point. We will trial low-dose lisinopril at this point for possible renal protective effects given stage II chronic kidney disease.  Plan: Medications:  Continue amlodipine 10 mg daily. Add lisinopril 10 mg daily.  Educational resources provided: brochure (denies) Self management tools provided:   Other plans: Follow-up in 2-3 weeks for check of renal function as well as blood pressure recheck.

## 2014-08-11 NOTE — Progress Notes (Signed)
   Subjective:    Patient ID: Peter Cox, male    DOB: [birth year], 54 y.o.   MRN: [id number]  HPI  Patient is a 54 year old with a history of hypertension, HIV, smoking, CVA, chronic kidney disease who presents to clinic for follow-up of his blood pressure.  Please refer to separate problem-list charting for more details.   Review of Systems  Constitutional: Negative for fever and chills.  HENT: Negative for sore throat.   Respiratory: Negative for shortness of breath.   Cardiovascular: Negative for chest pain and palpitations.  Gastrointestinal: Negative for nausea, vomiting, abdominal pain, diarrhea, constipation and blood in stool.  Genitourinary: Negative for dysuria and hematuria.  Skin: Negative for rash.  Neurological: Negative for syncope.  Psychiatric/Behavioral: Negative for suicidal ideas.       Objective:   Physical Exam  Constitutional: He is oriented to person, place, and time. He appears well-developed and well-nourished. No distress.  HENT:  Head: Normocephalic and atraumatic.  Eyes: EOM are normal. Pupils are equal, round, and reactive to light. No scleral icterus.  Neck: Normal range of motion. Neck supple. No thyromegaly present.  Cardiovascular: Normal rate and regular rhythm.  Exam reveals no gallop and no friction rub.   No murmur heard. Pulmonary/Chest: Effort normal and breath sounds normal. No respiratory distress. He has no wheezes. He has no rales.  Abdominal: Soft. Bowel sounds are normal. He exhibits no distension. There is no tenderness. There is no rebound.  Musculoskeletal: Normal range of motion. He exhibits no edema.  Neurological: He is alert and oriented to person, place, and time. No cranial nerve deficit.  Skin: No rash noted.          Assessment & Plan:  Please refer to separate problem-list charting for more details.

## 2014-08-11 NOTE — Assessment & Plan Note (Signed)
Patient states that he smokes 5 cigarettes a day. He states that he is agreeable to trying to reduce that down to 3 cigarettes a day.

## 2014-08-12 LAB — HIV-1 RNA QUANT-NO REFLEX-BLD
HIV 1 RNA Quant: 216 copies/mL — ABNORMAL HIGH (ref <20)
HIV-1 RNA Quant, Log: 2.33 {Log} — ABNORMAL HIGH (ref <1.30)

## 2014-08-12 LAB — COMPLETE METABOLIC PANEL WITH GFR
ALT: 20 U/L (ref 0–53)
AST: 18 U/L (ref 0–37)
Albumin: 4.1 g/dL (ref 3.5–5.2)
Alkaline Phosphatase: 81 U/L (ref 39–117)
BUN: 16 mg/dL (ref 6–23)
CO2: 26 mEq/L (ref 19–32)
Calcium: 9.3 mg/dL (ref 8.4–10.5)
Chloride: 104 mEq/L (ref 96–112)
Creat: 1.44 mg/dL — ABNORMAL HIGH (ref 0.50–1.35)
GFR, Est African American: 63 mL/min
GFR, Est Non African American: 54 mL/min — ABNORMAL LOW
Glucose, Bld: 83 mg/dL (ref 70–99)
Potassium: 3.7 mEq/L (ref 3.5–5.3)
Sodium: 140 mEq/L (ref 135–145)
Total Bilirubin: 0.4 mg/dL (ref 0.2–1.2)
Total Protein: 7.1 g/dL (ref 6.0–8.3)

## 2014-08-12 LAB — RPR

## 2014-08-12 LAB — T-HELPER CELL (CD4) - (RCID CLINIC ONLY)
CD4 % Helper T Cell: 37 % (ref 33–55)
CD4 T Cell Abs: 480 /uL (ref 400–2700)

## 2014-08-12 NOTE — Progress Notes (Signed)
INTERNAL MEDICINE TEACHING ATTENDING ADDENDUM - Adama Ferber, MD: I reviewed and discussed at the time of visit with the resident Dr. Ngo, the patient's medical history, physical examination, diagnosis and results of pertinent tests and treatment and I agree with the patient's care as documented.  

## 2014-08-13 ENCOUNTER — Other Ambulatory Visit: Payer: Self-pay | Admitting: Internal Medicine

## 2014-09-09 ENCOUNTER — Ambulatory Visit (INDEPENDENT_AMBULATORY_CARE_PROVIDER_SITE_OTHER): Payer: 59 | Admitting: Infectious Disease

## 2014-09-09 ENCOUNTER — Ambulatory Visit: Payer: 59 | Admitting: Internal Medicine

## 2014-09-09 ENCOUNTER — Encounter: Payer: Self-pay | Admitting: Infectious Disease

## 2014-09-09 VITALS — BP 179/89 | HR 73 | Temp 94.7°F | Ht 65.0 in | Wt 133.0 lb

## 2014-09-09 DIAGNOSIS — B20 Human immunodeficiency virus [HIV] disease: Secondary | ICD-10-CM | POA: Diagnosis not present

## 2014-09-09 DIAGNOSIS — F0151 Vascular dementia with behavioral disturbance: Secondary | ICD-10-CM

## 2014-09-09 DIAGNOSIS — F17200 Nicotine dependence, unspecified, uncomplicated: Secondary | ICD-10-CM

## 2014-09-09 DIAGNOSIS — F0280 Dementia in other diseases classified elsewhere without behavioral disturbance: Secondary | ICD-10-CM

## 2014-09-09 DIAGNOSIS — I10 Essential (primary) hypertension: Secondary | ICD-10-CM

## 2014-09-09 DIAGNOSIS — Z8673 Personal history of transient ischemic attack (TIA), and cerebral infarction without residual deficits: Secondary | ICD-10-CM | POA: Diagnosis not present

## 2014-09-09 DIAGNOSIS — F01518 Vascular dementia, unspecified severity, with other behavioral disturbance: Secondary | ICD-10-CM

## 2014-09-09 DIAGNOSIS — N182 Chronic kidney disease, stage 2 (mild): Secondary | ICD-10-CM

## 2014-09-09 DIAGNOSIS — Z720 Tobacco use: Secondary | ICD-10-CM

## 2014-09-09 DIAGNOSIS — IMO0001 Reserved for inherently not codable concepts without codable children: Secondary | ICD-10-CM

## 2014-09-09 NOTE — Progress Notes (Signed)
Subjective:    Patient ID: Peter Cox, male    DOB: Nov 18, 1960, 54 y.o.   MRN: [id number]  HPI   54  year old with hx HTN, smoking and HIV which at times has been controlled on Prezista, Norvir and [name] with VL but more recently with low grade viremia. His hypertension is still completely out of control despite multiple visits to his primary care physician. He seems to have very little insight into his condition frequently laughing during the interview. Again his girlfriend does much of the talking.  Lab Results  Component Value Date   HIV1RNAQUANT 216* 08/11/2014   Lab Results  Component Value Date   CD4TABS 480 08/11/2014   CD4TABS 650 03/24/2014   CD4TABS 760 06/25/2013   He says he misses his ARVs once a week but no more frequently than that.     Review of Systems  Constitutional: Negative for chills, diaphoresis, activity change, appetite change, fatigue and unexpected weight change.  HENT: Negative for congestion, rhinorrhea, sinus pressure, sneezing, sore throat and trouble swallowing.   Eyes: Negative for photophobia and visual disturbance.  Respiratory: Negative for cough, chest tightness, shortness of breath, wheezing and stridor.   Cardiovascular: Negative for chest pain, palpitations and leg swelling.  Gastrointestinal: Negative for nausea, vomiting, abdominal pain, diarrhea, constipation, blood in stool, abdominal distention and anal bleeding.  Genitourinary: Negative for dysuria, hematuria, flank pain and difficulty urinating.  Musculoskeletal: Positive for arthralgias. Negative for myalgias, back pain, joint swelling and gait problem.  Skin: Negative for pallor, rash and wound.  Neurological: Negative for dizziness, tremors, weakness and light-headedness.  Hematological: Negative for adenopathy. Does not bruise/bleed easily.  Psychiatric/Behavioral: Positive for sleep disturbance, dysphoric mood and decreased concentration. Negative for suicidal ideas,  hallucinations, behavioral problems, confusion, self-injury and agitation. The patient is nervous/anxious. The patient is not hyperactive.        Objective:   Physical Exam  Constitutional: He is oriented to person, place, and time. No distress.  HENT:  Head: Normocephalic and atraumatic.  Mouth/Throat: Oropharynx is clear and moist. No oropharyngeal exudate.  Eyes: Conjunctivae and EOM are normal. No scleral icterus.  Neck: Normal range of motion. Neck supple.  Cardiovascular: Normal rate, regular rhythm and normal heart sounds.  Exam reveals no gallop and no friction rub.   No murmur heard. Pulmonary/Chest: Effort normal and breath sounds normal. No respiratory distress. He has no wheezes. He has no rales.  Abdominal: He exhibits no distension. There is no tenderness. There is no rebound.  Musculoskeletal:       Left shoulder: He exhibits tenderness.       Legs: Neurological: He is alert and oriented to person, place, and time. He exhibits normal muscle tone. Coordination normal.  Skin: Skin is warm and dry. He is not diaphoretic. No erythema. No pallor.  Psychiatric: His behavior is normal. Thought content normal. His speech is delayed. He expresses inappropriate judgment.  Quiet          Assessment & Plan:   #1HIV: continue current regimen, check labs today and bring back in 2 months.   #2 HTN: continue amlodipine and ACEI. He had NO ssx today but we did give him clonidine doses prior to DC. He had a follow up appointment with internal medicine today later.I spent greater than 25 minutes with the patient including greater than 50% of time in face to face counsel of the patient and in coordination of their care.  #3 hx of CVA: continue statin,  bp control  #4 Hyperlipdemia: continue statin   #5 Substance abuse etohism per wife better more issue with THC intoxication in the past  #6 smoking:  HIV dementia: doing better with structure  #8 Depression:  Continue  lexapro

## 2014-09-16 ENCOUNTER — Telehealth: Payer: Self-pay | Admitting: Internal Medicine

## 2014-09-16 NOTE — Telephone Encounter (Signed)
Call to patient to confirm appointment for 09/17/14 at 1:45. lmtcb

## 2014-09-17 ENCOUNTER — Ambulatory Visit: Payer: 59 | Admitting: Internal Medicine

## 2014-10-06 ENCOUNTER — Ambulatory Visit: Payer: 59 | Admitting: Internal Medicine

## 2014-10-06 ENCOUNTER — Encounter: Payer: Self-pay | Admitting: Internal Medicine

## 2014-10-06 ENCOUNTER — Ambulatory Visit (INDEPENDENT_AMBULATORY_CARE_PROVIDER_SITE_OTHER): Payer: 59 | Admitting: Internal Medicine

## 2014-10-06 ENCOUNTER — Encounter: Payer: Self-pay | Admitting: Licensed Clinical Social Worker

## 2014-10-06 VITALS — BP 174/100 | HR 68 | Temp 97.8°F | Ht 65.0 in | Wt 132.0 lb

## 2014-10-06 DIAGNOSIS — Z21 Asymptomatic human immunodeficiency virus [HIV] infection status: Secondary | ICD-10-CM | POA: Diagnosis not present

## 2014-10-06 DIAGNOSIS — I10 Essential (primary) hypertension: Secondary | ICD-10-CM | POA: Diagnosis not present

## 2014-10-06 DIAGNOSIS — F01518 Vascular dementia, unspecified severity, with other behavioral disturbance: Secondary | ICD-10-CM

## 2014-10-06 DIAGNOSIS — B20 Human immunodeficiency virus [HIV] disease: Secondary | ICD-10-CM | POA: Diagnosis not present

## 2014-10-06 DIAGNOSIS — F0151 Vascular dementia with behavioral disturbance: Secondary | ICD-10-CM

## 2014-10-06 DIAGNOSIS — Z79899 Other long term (current) drug therapy: Secondary | ICD-10-CM | POA: Diagnosis not present

## 2014-10-06 LAB — VITAMIN B12: Vitamin B-12: 548 pg/mL (ref 211–911)

## 2014-10-06 MED ORDER — LISINOPRIL 20 MG PO TABS: 10.0000 mg | ORAL_TABLET | Freq: Every day | ORAL | Status: DC

## 2014-10-06 NOTE — Assessment & Plan Note (Signed)
HIV well controlled. Follows up with ID clinic.

## 2014-10-06 NOTE — Progress Notes (Signed)
Patient ID: Peter Cox, male   DOB: 18-Aug-1960, 54 y.o.   MRN: [id number]   Subjective:   HPI: [name] is a 54 y.o. gentleman with a past medical history below presents for routine visit accompanied by his friend. No specific complaints today. His friend is concerned about his vitamin B-12 level, which has been an issue in the past and she would like it to be rechecked today.  Blood pressure has been uncontrolled over the past several weeks. I will address this today.  Kindly see the A&P for the status of the pt's chronic medical problems.   Past Medical History  Diagnosis Date  . Hypertension   . Cocaine abuse      clean since about 2000  . Marijuana abuse   . Alcohol abuse     heavy, clean since April  . Tobacco abuse   . Mild cognitive impairment     likely vascular dementia/psa/?HIV componentCT Head 02/12/08 for htn crisis and concern for stroke: impression-1. Old right internal capsule and thalamic lacunar infarcts. 2. Mild to moderate chronic small vessell white matter ischemic changes in both cerebral hemisphere, greater on the right  . HIV infection     dx 08/08/2008 (unknown how he was exposed)  Initial VL 43,200 and CD4 410 on 08/28/08  . Hyperlipidemia   . Neuromuscular disorder   . Stroke   . Seizures     "last year had seizure"    ROS: Constitutional: Denies fever, chills, diaphoresis, appetite change and fatigue.  Respiratory: Denies SOB, DOE, cough, chest tightness, and wheezing. Denies chest pain. CVS: No chest pain, palpitations and leg swelling.  GI: No abdominal pain, nausea, vomiting, bloody stools GU: No dysuria, frequency, hematuria, or flank pain.  MSK: No myalgias, back pain, joint swelling, arthralgias  Psych: No depression symptoms. No SI or SA.    Objective:  Physical Exam: Filed Vitals:   10/06/14 1109  BP: 174/100  Pulse: 68  Temp: 97.8 F (36.6 C)  TempSrc: Oral  Height: 5\' 5"  (1.651 m)  Weight: 132 lb (59.875 kg)  SpO2:  100%   General: Well nourished. No acute distress.  HEENT: Normal oral mucosa. MMM.  Lungs: CTA bilaterally. Heart: RRR; no extra sounds or murmurs  Abdomen: Non-distended, normal bowel sounds, soft, nontender; no hepatosplenomegaly  Extremities: No pedal edema. No joint swelling or tenderness. Neurologic: Normal EOM,  Alert and oriented x3. No obvious neurologic/cranial nerve deficits.  Assessment & Plan:  Discussed case with my attending in the clinic, Dr. Dareen Piano See problem based charting.

## 2014-10-06 NOTE — Patient Instructions (Signed)
General Instructions: Please Increase Lisinopril to 20 mg daily  Please go to lab. Please become back in 2 weeks   Please bring your medicines with you each time you come to clinic.  Medicines may include prescription medications, over-the-counter medications, herbal remedies, eye drops, vitamins, or other pills.   Progress Toward Treatment Goals:  Treatment Goal 08/11/2014  Blood pressure improved  Stop smoking smoking the same amount  Prevent falls -    Self Care Goals & Plans:  Self Care Goal 10/06/2014  Manage my medications take my medicines as prescribed; bring my medications to every visit; refill my medications on time  Monitor my health -  Eat healthy foods drink diet soda or water instead of juice or soda; eat more vegetables; eat foods that are low in salt; eat baked foods instead of fried foods; eat fruit for snacks and desserts  Be physically active -  Stop smoking -    No flowsheet data found.   Care Management & Community Referrals:  Referral 04/30/2014  Referrals made for care management support none needed

## 2014-10-06 NOTE — Assessment & Plan Note (Addendum)
BP Readings from Last 3 Encounters:  10/06/14 174/100  09/09/14 179/89  08/11/14 171/98    Lab Results  Component Value Date   NA 140 08/11/2014   K 3.7 08/11/2014   CREATININE 1.44* 08/11/2014    Assessment: Blood pressure control: severely elevated Progress toward BP goal:  deteriorated Comments: He reports compliance with current treatment.   Plan: Medications:  Amlodipine 10 mg daily. Increase Lisinopril from 10 mg daily to 20 mg daily. Educational resources provided: brochure (denies) Self management tools provided:   Other plans: Follow-up in 2 weeks for recheck BP and BMP. Consider increasing Lisinopril to 40 mg at that time.

## 2014-10-07 NOTE — Progress Notes (Signed)
Peter Cox has both medicare/medicaid and is in need of medical transportation.  CSW met with Peter Cox and companion today during scheduled [facility] appointment.  Companion mentioned pt's Medicare coverage has been affected by his child support rearages.  CSW provided Peter Cox with [employer] brochure and explained medical transportation is a covered benefit under his medicaid through Database administrator.  Pt and companion were unaware of benefit.  Pt denies add'l social work needs at this time.  Pt is aware of CSW is available as needed.

## 2014-10-14 NOTE — Addendum Note (Signed)
Addended by: Jessee Avers on: 10/14/2014 08:43 AM   Modules accepted: Level of Service

## 2014-10-14 NOTE — Progress Notes (Signed)
INTERNAL MEDICINE TEACHING ATTENDING ADDENDUM - Maryam Feely, MD: I reviewed and discussed at the time of visit with the resident Dr. Kazibwe, the patient's medical history, physical examination, diagnosis and results of pertinent tests and treatment and I agree with the patient's care as documented.  

## 2014-10-20 ENCOUNTER — Ambulatory Visit: Payer: 59 | Admitting: Internal Medicine

## 2014-10-20 ENCOUNTER — Ambulatory Visit (INDEPENDENT_AMBULATORY_CARE_PROVIDER_SITE_OTHER): Payer: 59 | Admitting: Internal Medicine

## 2014-10-20 VITALS — BP 177/93 | HR 73 | Temp 98.4°F | Wt 130.0 lb

## 2014-10-20 DIAGNOSIS — I10 Essential (primary) hypertension: Secondary | ICD-10-CM

## 2014-10-20 DIAGNOSIS — Z9114 Patient's other noncompliance with medication regimen: Secondary | ICD-10-CM | POA: Diagnosis not present

## 2014-10-20 LAB — BASIC METABOLIC PANEL WITH GFR
BUN: 25 mg/dL — ABNORMAL HIGH (ref 6–23)
CO2: 28 mEq/L (ref 19–32)
Calcium: 9 mg/dL (ref 8.4–10.5)
Chloride: 106 mEq/L (ref 96–112)
Creat: 1.47 mg/dL — ABNORMAL HIGH (ref 0.50–1.35)
GFR, Est African American: 61 mL/min
GFR, Est Non African American: 53 mL/min — ABNORMAL LOW
Glucose, Bld: 61 mg/dL — ABNORMAL LOW (ref 70–99)
Potassium: 4 mEq/L (ref 3.5–5.3)
Sodium: 142 mEq/L (ref 135–145)

## 2014-10-20 MED ORDER — AMLODIPINE BESYLATE 10 MG PO TABS: 10.0000 mg | ORAL_TABLET | Freq: Every day | ORAL | Status: DC

## 2014-10-20 MED ORDER — LISINOPRIL 20 MG PO TABS: 20.0000 mg | ORAL_TABLET | Freq: Every day | ORAL | Status: DC

## 2014-10-20 NOTE — Progress Notes (Signed)
INTERNAL MEDICINE TEACHING ATTENDING ADDENDUM - Aldine Contes, MD: I reviewed and discussed at the time of visit with the resident Dr. Hulen Luster, the patient's medical history, physical examination, diagnosis and results of pertinent tests and treatment and I agree with the patient's care as documented.

## 2014-10-20 NOTE — Assessment & Plan Note (Signed)
BP Readings from Last 3 Encounters:  10/20/14 177/93  10/06/14 174/100  09/09/14 179/89    Lab Results  Component Value Date   NA 140 08/11/2014   K 3.7 08/11/2014   CREATININE 1.44* 08/11/2014    Assessment: Blood pressure control:  uncontrolled Progress toward BP goal:   deteriorated  Comments: pt is non compliant with medications. Has documented hx as well. Did not take norvasc today. Pt has HIV dementia and wife helps take care of him, states she is unsure if she takes his medications when she is away. Says he does not like taking big pills. He uses a pill box for medications.   Plan: Medications:  continue current medications, will not increase lisinopril 20 mg to 40mg  at this time. Stressed importance of medication compliance. Will get a BMET today and f/u in 2 weeks for blood pressure check. If BP still elevated and increase lisinopril at that time. Refilled lisinopril and norvasc.  Other plans: social work consult for a home health nurse, wife very interested in this as well.

## 2014-10-20 NOTE — Patient Instructions (Signed)
Take lisinopril 20mg  once a day and norvasc 10mg  once a day. We will see you in 2 weeks.

## 2014-10-20 NOTE — Progress Notes (Signed)
   Subjective:    Patient ID: JULYEN NYARKO, male    DOB: [birth year], 54 y.o.   MRN: [id number]  HPI Pt is a 54 y/o male w/ PMHx of HTN, HIV dementia (CD4 480 on 07/2014), and polysubstance abuse who presents for HTN f/u. Please see problem list for further details.     Review of Systems  Constitutional: Negative for appetite change and unexpected weight change.  Eyes:       Blurry vision  Respiratory: Negative for shortness of breath.   Cardiovascular: Negative for chest pain.  Gastrointestinal: Negative for diarrhea and constipation.  Genitourinary: Negative for dysuria.  Neurological: Negative for headaches.       Objective:   Physical Exam  Constitutional: He appears well-developed and well-nourished. No distress.  HENT:  Head: Normocephalic.  Cardiovascular: Normal rate and regular rhythm.   No murmur heard. Pulmonary/Chest: Effort normal and breath sounds normal. He has no wheezes.  Abdominal: Soft. Bowel sounds are normal.          Assessment & Plan:  Please see problem based assessment and plan.

## 2014-10-22 ENCOUNTER — Other Ambulatory Visit: Payer: Self-pay | Admitting: Internal Medicine

## 2014-10-22 DIAGNOSIS — I10 Essential (primary) hypertension: Secondary | ICD-10-CM

## 2014-10-23 ENCOUNTER — Telehealth: Payer: Self-pay | Admitting: Licensed Clinical Social Worker

## 2014-10-24 ENCOUNTER — Encounter: Payer: Self-pay | Admitting: Licensed Clinical Social Worker

## 2014-10-24 NOTE — Telephone Encounter (Signed)
CSW placed call to Peter Cox to discuss home health referral and community resources.  Peter Cox answered the call and provided the phone to Peter Cox.  Peter Cox in agreement for [facility] RN for medication education/pill box management.  Pt has not had [facility] services in the past and in agreement with referral to [facility].  CSW inquired with Peter Cox if CSW could discuss referral and resources with Peter Cox.  Pt agreed and placed Peter Cox on the phone.  Peter Cox was friends with Peter Cox' mother.  Prior to pt's mothers passing, mother asked Peter Cox to "look after" Peter Cox.  Peter Cox has been provided oversight to Peter Cox since death of pt's of mother.  Peter Cox states Peter Cox is independent with his ADL's but needs reminders and cues for homemaking skills.  Referral to [facility] RN as pt is in need of medication education and assistance with pill box.  Discussed referral and resources available for Peter Cox to Peter Cox.  CSW inquired if Peter Cox had completed a HCPOA, designating Peter Cox.  Pt has not.  CSW encouraged Peter Cox to work with pt and discuss HCPOA, as there are other family member in the picture.  Peter Cox states sometimes family is not the best influence on Peter Cox, as he has a history substance abuse.  CSW will send information on Advanced Directives, Guardianship and [facility] for Peter Cox to review.

## 2014-11-04 ENCOUNTER — Encounter: Payer: Self-pay | Admitting: *Deleted

## 2014-11-04 DIAGNOSIS — F1421 Cocaine dependence, in remission: Secondary | ICD-10-CM | POA: Diagnosis not present

## 2014-11-04 DIAGNOSIS — F0280 Dementia in other diseases classified elsewhere without behavioral disturbance: Secondary | ICD-10-CM | POA: Diagnosis not present

## 2014-11-04 DIAGNOSIS — B20 Human immunodeficiency virus [HIV] disease: Secondary | ICD-10-CM | POA: Diagnosis not present

## 2014-11-04 DIAGNOSIS — I10 Essential (primary) hypertension: Secondary | ICD-10-CM | POA: Diagnosis not present

## 2014-11-07 ENCOUNTER — Other Ambulatory Visit: Payer: Self-pay | Admitting: Internal Medicine

## 2014-11-07 DIAGNOSIS — B20 Human immunodeficiency virus [HIV] disease: Secondary | ICD-10-CM

## 2014-11-10 ENCOUNTER — Other Ambulatory Visit: Payer: Self-pay | Admitting: *Deleted

## 2014-11-10 ENCOUNTER — Other Ambulatory Visit: Payer: 59

## 2014-11-10 DIAGNOSIS — B20 Human immunodeficiency virus [HIV] disease: Secondary | ICD-10-CM

## 2014-11-10 MED ORDER — RITONAVIR 100 MG PO TABS: ORAL_TABLET | ORAL | Status: DC

## 2014-11-10 MED ORDER — DARUNAVIR ETHANOLATE 800 MG PO TABS: ORAL_TABLET | ORAL | Status: DC

## 2014-11-10 MED ORDER — ABACAVIR SULFATE-LAMIVUDINE 600-300 MG PO TABS: 1.0000 | ORAL_TABLET | Freq: Every day | ORAL | Status: DC

## 2014-11-24 ENCOUNTER — Encounter: Payer: Self-pay | Admitting: Infectious Disease

## 2014-11-24 ENCOUNTER — Ambulatory Visit (INDEPENDENT_AMBULATORY_CARE_PROVIDER_SITE_OTHER): Payer: 59 | Admitting: Infectious Disease

## 2014-11-24 VITALS — BP 157/93 | HR 60 | Temp 97.4°F | Wt 128.0 lb

## 2014-11-24 DIAGNOSIS — N182 Chronic kidney disease, stage 2 (mild): Secondary | ICD-10-CM

## 2014-11-24 DIAGNOSIS — F0280 Dementia in other diseases classified elsewhere without behavioral disturbance: Secondary | ICD-10-CM

## 2014-11-24 DIAGNOSIS — Z8673 Personal history of transient ischemic attack (TIA), and cerebral infarction without residual deficits: Secondary | ICD-10-CM

## 2014-11-24 DIAGNOSIS — F01518 Vascular dementia, unspecified severity, with other behavioral disturbance: Secondary | ICD-10-CM

## 2014-11-24 DIAGNOSIS — B20 Human immunodeficiency virus [HIV] disease: Secondary | ICD-10-CM | POA: Diagnosis not present

## 2014-11-24 DIAGNOSIS — I10 Essential (primary) hypertension: Secondary | ICD-10-CM

## 2014-11-24 DIAGNOSIS — F02818 Dementia in other diseases classified elsewhere, unspecified severity, with other behavioral disturbance: Secondary | ICD-10-CM

## 2014-11-24 DIAGNOSIS — F0151 Vascular dementia with behavioral disturbance: Secondary | ICD-10-CM

## 2014-11-24 DIAGNOSIS — F1910 Other psychoactive substance abuse, uncomplicated: Secondary | ICD-10-CM

## 2014-11-24 DIAGNOSIS — F1010 Alcohol abuse, uncomplicated: Secondary | ICD-10-CM

## 2014-11-24 DIAGNOSIS — F919 Conduct disorder, unspecified: Secondary | ICD-10-CM

## 2014-11-24 DIAGNOSIS — F0281 Dementia in other diseases classified elsewhere with behavioral disturbance: Principal | ICD-10-CM

## 2014-11-24 HISTORY — DX: Alcohol abuse, uncomplicated: F10.10

## 2014-11-24 HISTORY — DX: Other psychoactive substance abuse, uncomplicated: F19.10

## 2014-11-24 MED ORDER — DARUNAVIR-COBICISTAT 800-150 MG PO TABS: 1.0000 | ORAL_TABLET | Freq: Every day | ORAL | Status: DC

## 2014-11-24 NOTE — Progress Notes (Signed)
   Subjective:    Patient ID: Peter Cox, male    DOB: [birth year], 54 y.o.   MRN: [id number]  HPI Peter Cox is a 54 yo male with PMH of HIV (vL  216, and CD4 count of 480 in Feb 2016), he also has a history of basal ganglia infarction and HIV dementia, as well as HTN, CKD, HLD.  He presents today with his caregiver who reports he has not been very concerned about his health and she is very concerned that everyone is trying to help him and he wont help himself.  He reports that he uses a pill box but that he simply forgets to take his medications at times.  His caregiver also notes that he is preoccupied with having sex with her (she used to be his girlfriend) but that she does not want to unless they get married.  He reports he is not sexually active with any other people.  He reports he does have desire to have sex.   Review of Systems  Constitutional: Negative for fever, chills and fatigue.  HENT: Negative for congestion.   Respiratory: Negative for chest tightness and shortness of breath.   Cardiovascular: Negative for chest pain.  Gastrointestinal: Negative for abdominal pain.  Genitourinary: Negative for dysuria and discharge.  Neurological: Negative for dizziness.       Objective:   Physical Exam  Constitutional: No distress.  HENT:  Head: Normocephalic and atraumatic.  Eyes: Conjunctivae are normal.  Cardiovascular: Normal rate, regular rhythm and normal heart sounds.   Pulmonary/Chest: Effort normal and breath sounds normal.  Abdominal: Soft. Bowel sounds are normal.  Psychiatric: His speech is normal. His affect is inappropriate (inappropriate laughter). He exhibits a depressed mood.  Nursing note and vitals reviewed.         Assessment & Plan:  HIV -Recheck CD4, and vL - Some missed doses likely related to dementia. - Change to Prezcobix and [name] >>> with the colbicistat will expect a small increase in serum creatinine.  Depression: - Not taking  Lexapro, does admit some depression ETOH and marijuana use. -Willing to speak with our consolers   HTN Likely not fully compliant with antihypertensives, following with the [facility].

## 2014-11-24 NOTE — Progress Notes (Signed)
Patient ID: Peter Cox, male   DOB: 19-Apr-1961, 54 y.o.   MRN: [id number]  INFECTIOUS DISEASE ATTENDING ADDENDUM:     [place] for Infectious Disease   Date: 11/24/2014  Patient name: Peter Cox  Medical record number: [id number]  Date of birth: [birth year]    This patient has been seen and discussed with the house staff. Please see their note for complete details. I concur with their findings with the following additions/corrections:  [name]'s caregiver, and woman with whom  He has had sexual relations expressed signfiicant concnerns about [name]'s ability to take care of himself, take his ARV. She is also concerned that the pt is likely abusing drugs, etoh and overly concerned with wanting to have sex.There are apparently also dynamics between his caregiver and pts family.  As usual, [name] says very little and laughs and shrugs off most of what caregiver says   HIV with HIV dementia hx of CVAs:  I am going to simplify his HIV regimen to [name] and [name]--> [name] when latter is covered   Pharmacy educated pt  We will engage with [name] RN visits to home to assess.  HTN with hx of CVA poorly controlled and not likely taking anti-HTNSive either  Depression and PSA: will have him meet with [name] and good idea to restart his SSRI  I spent greater than 40 minutes with the patient including greater than 50% of time in face to face counsel of the patient and caregiver and in coordination of their care.   Alcide Evener 11/24/2014, 9:49 PM

## 2014-11-24 NOTE — Progress Notes (Signed)
Patient ID: Peter Cox, male   DOB: 21-May-1961, 54 y.o.   MRN: [id number]   [facility] for Infectious Disease - Pharmacist    HPI: Peter Cox is a 54 y.o. male presents for follow up of HIV management accompanied by his caregiver. Caregiver explains she is concerned about the patient's lack of concern regarding his health and how often he forgets to take his medication.   Allergies: No Known Allergies  Vitals: Temp: 97.4 F (36.3 C) (06/06 1048) Temp Source: Oral (06/06 1048) BP: 157/93 mmHg (06/06 1048) Pulse Rate: 60 (06/06 1048)  Past Medical History: Past Medical History  Diagnosis Date  . Hypertension   . Cocaine abuse      clean since about 2000  . Marijuana abuse   . Alcohol abuse     heavy, clean since April  . Tobacco abuse   . Mild cognitive impairment     likely vascular dementia/psa/?HIV componentCT Head 02/12/08 for htn crisis and concern for stroke: impression-1. Old right internal capsule and thalamic lacunar infarcts. 2. Mild to moderate chronic small vessell white matter ischemic changes in both cerebral hemisphere, greater on the right  . HIV infection     dx 08/08/2008 (unknown how he was exposed)  Initial VL 43,200 and CD4 410 on 08/28/08  . Hyperlipidemia   . Neuromuscular disorder   . Stroke   . Seizures     "last year had seizure"    Social History: History   Social History  . Marital Status: Single    Spouse Name: N/A  . Number of Children: N/A  . Years of Education: N/A   Social History Main Topics  . Smoking status: Current Every Day Smoker -- 0.50 packs/day for 40 years    Types: Cigarettes  . Smokeless tobacco: Never Used     Comment: Quit  x 2 week.  . Alcohol Use: 0.0 oz/week    0 Standard drinks or equivalent per week     Comment: drink beer  . Drug Use: 7.00 per week    Special: Marijuana     Comment: use Marijuana every day per pt.  . Sexual Activity: Not on file     Comment: given 2 bags of condoms   Other Topics  Concern  . None   Social History Narrative   Lives in [place] with male friend Peter Cox), and her family.  They have been together x 77yrs but are no longer sexually active.  Not working. 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 a grown daughter    Previous Regimen: Truvada, Prezista/r [name], Prezista/r  Current Regimen: [name], Prezista/r  Labs: HIV 1 RNA QUANT (copies/mL)  Date Value  08/11/2014 216*  03/24/2014 <20  06/25/2013 49*   CD4 T CELL ABS (/uL)  Date Value  08/11/2014 480  03/24/2014 650  06/25/2013 760   HEPATITIS B SURFACE AG (no units)  Date Value  11/23/2012 NEGATIVE   HCV AB (no units)  Date Value  08/28/2008 NEG   CrCl: CrCl cannot be calculated (Patient has no serum creatinine result on file.).  Lipids:    Component Value Date/Time   CHOL 140 11/22/2012 1050   TRIG 87 11/22/2012 1050   HDL 36* 11/22/2012 1050   CHOLHDL 3.9 11/22/2012 1050   VLDL 17 11/22/2012 1050   LDLCALC 87 11/22/2012 1050    Assessment: 54 yo male previously with viral suppression now with VL 216 and CD4 480. Caregiver explains patient  is not compliant. The importance of taking these medications daily and what type of regimen noncompliance can lead to as well as the health risk were explained in detail. Dr. Tommy Medal switched the patient from Prezista/r to Prezcobix. It was stressed that this medication must be taken with food. Caregiver expressed that patient has difficulty swallowing and it was advised to the patient that he can crush his medications, but to ensure that he takes the full dose. Patient/caregiver expressed understanding and thanks.   Recommendations: Continue [name] and begin Prezcobix per Dr. Tommy Medal Discontinue Prezista/r per Dr. Tommy Medal Stress the importance of compliance RTC per Dr. Tommy Medal  Peter Cox, [place], [place].D. Clinical Infectious Disease [place] for Infectious Disease 11/24/2014, 2:00 PM

## 2014-11-25 LAB — CBC WITH DIFFERENTIAL/PLATELET
Basophils Absolute: 0 10*3/uL (ref 0.0–0.1)
Basophils Relative: 1 % (ref 0–1)
Eosinophils Absolute: 0.1 10*3/uL (ref 0.0–0.7)
Eosinophils Relative: 2 % (ref 0–5)
HCT: 42.3 % (ref 39.0–52.0)
Hemoglobin: 14.3 g/dL (ref 13.0–17.0)
Lymphocytes Relative: 34 % (ref 12–46)
Lymphs Abs: 1.4 10*3/uL (ref 0.7–4.0)
MCH: 30.2 pg (ref 26.0–34.0)
MCHC: 33.8 g/dL (ref 30.0–36.0)
MCV: 89.2 fL (ref 78.0–100.0)
MPV: 12 fL (ref 8.6–12.4)
Monocytes Absolute: 0.3 10*3/uL (ref 0.1–1.0)
Monocytes Relative: 6 % (ref 3–12)
Neutro Abs: 2.4 10*3/uL (ref 1.7–7.7)
Neutrophils Relative %: 57 % (ref 43–77)
Platelets: 191 10*3/uL (ref 150–400)
RBC: 4.74 MIL/uL (ref 4.22–5.81)
RDW: 14.9 % (ref 11.5–15.5)
WBC: 4.2 10*3/uL (ref 4.0–10.5)

## 2014-11-25 LAB — LIPID PANEL
Cholesterol: 140 mg/dL (ref 0–200)
HDL: 53 mg/dL (ref >=40)
LDL Cholesterol: 71 mg/dL (ref 0–99)
Total CHOL/HDL Ratio: 2.6 Ratio
Triglycerides: 79 mg/dL (ref <150)
VLDL: 16 mg/dL (ref 0–40)

## 2014-11-25 LAB — COMPLETE METABOLIC PANEL WITH GFR
ALT: 16 U/L (ref 0–53)
AST: 16 U/L (ref 0–37)
Albumin: 3.8 g/dL (ref 3.5–5.2)
Alkaline Phosphatase: 81 U/L (ref 39–117)
BUN: 22 mg/dL (ref 6–23)
CO2: 31 mEq/L (ref 19–32)
Calcium: 9 mg/dL (ref 8.4–10.5)
Chloride: 104 mEq/L (ref 96–112)
Creat: 1.49 mg/dL — ABNORMAL HIGH (ref 0.50–1.35)
GFR, Est African American: 60 mL/min
GFR, Est Non African American: 52 mL/min — ABNORMAL LOW
Glucose, Bld: 69 mg/dL — ABNORMAL LOW (ref 70–99)
Potassium: 4.4 mEq/L (ref 3.5–5.3)
Sodium: 138 mEq/L (ref 135–145)
Total Bilirubin: 0.5 mg/dL (ref 0.2–1.2)
Total Protein: 7 g/dL (ref 6.0–8.3)

## 2014-11-25 LAB — RPR

## 2014-11-25 LAB — MICROALBUMIN / CREATININE URINE RATIO
Creatinine, Urine: 371.1 mg/dL
Microalb Creat Ratio: 72.5 mg/g — ABNORMAL HIGH (ref 0.0–30.0)
Microalb, Ur: 26.9 mg/dL — ABNORMAL HIGH (ref <2.0)

## 2014-11-25 LAB — HIV-1 RNA ULTRAQUANT REFLEX TO GENTYP+
HIV 1 RNA Quant: 12019 copies/mL — ABNORMAL HIGH (ref <20)
HIV-1 RNA Quant, Log: 4.08 {Log} — ABNORMAL HIGH (ref <1.30)

## 2014-11-26 ENCOUNTER — Telehealth: Payer: Self-pay | Admitting: *Deleted

## 2014-11-26 LAB — T-HELPER CELL (CD4) - (RCID CLINIC ONLY)
CD4 % Helper T Cell: 44 % (ref 33–55)
CD4 T Cell Abs: 620 /uL (ref 400–2700)

## 2014-11-26 NOTE — Telephone Encounter (Signed)
Referral received for [facility]. RN meet and spoke with the patient who agreed to having home visit with the [place] Nurse(CBHCN). Contacted the pt today to arrange a visit time. RN left a detailed message with Ms. Peter Cox/pt's friend requesting a call back to arrange a date and time for our visit. RN will continue to try and make contact with the patient along with a return call from Ms. [name]

## 2014-12-02 LAB — HIV-1 GENOTYPR PLUS

## 2014-12-04 ENCOUNTER — Telehealth: Payer: Self-pay | Admitting: *Deleted

## 2014-12-04 ENCOUNTER — Ambulatory Visit: Payer: 59 | Admitting: *Deleted

## 2014-12-04 VITALS — BP 132/80 | HR 64 | Temp 96.8°F | Resp 18

## 2014-12-04 DIAGNOSIS — B20 Human immunodeficiency virus [HIV] disease: Secondary | ICD-10-CM

## 2014-12-04 DIAGNOSIS — Z9114 Patient's other noncompliance with medication regimen: Secondary | ICD-10-CM

## 2014-12-04 DIAGNOSIS — F0281 Dementia in other diseases classified elsewhere with behavioral disturbance: Secondary | ICD-10-CM

## 2014-12-04 NOTE — Telephone Encounter (Signed)
Received a call back from Ms [name] stating she has not been able to find the patient for several days.  Ms. [name] stated she has went over to the patient's home several times and has not been able to locate him. Ms. Kathryne Sharper number and her family members are the only numbers listed on the patient's chart so I offered to drive by the patient's home in a attempt to locate him. Ms. [name] stated she appreciated my help and would contact me if she locates the patient. Currently she states she is concerned that he is not taking his medications as instructed. Ms. [name] went on the say that during the patient's last MD appt he was told that his medications are not very "forgiving" and he must take the medications everyday. Reassured Ms. Alice that is a valid concern and I will do all I can to try and assist the patient.

## 2014-12-08 ENCOUNTER — Encounter: Payer: Self-pay | Admitting: *Deleted

## 2014-12-08 NOTE — Progress Notes (Signed)
Patient ID: Peter Cox, male   DOB: Jan 08, 1961, 54 y.o.   MRN: [id number]  Order received on 11/24/14 by Dr.Van Dam/Tamika CMA to evaluate patient for [facility]).  Initial Patient contact made on 11/24/14 in the office with a f/u call for scheduling on 11/26/14. Patient was evaluated on 12/04/2014 for CBHCNS.  Patient was consented to care at this time.    Frequency / Duration of CBHCN visits: 1w10, 3PRN's for complications with disease process/progression, medication changes or concerns   CBHCN will assess for learning needs related to diagnosis and treatment regimen, provide education as needed, RN may fill pillbox is assessed to be needed by the RN, and communicate with care team including physician and case managers,and bridge counselors.  Individualized  Plan Of Care Certification Period: 12/04/2014-03/04/2015 a. Type of service(s) and care to be delivered: RN ([place] Nurse) b. Frequency and duration of service:1w10, 3PRN's for complications with disease process/progression, medication changes or concerns  c. Activity restrictions: None Noted d. Safety Measures; Standard Precautions, Infection Control e. Service Objectives and Goals: Pt Centered Goal is to start taking his medicaitions. Current RN goal is to educate the patient on living with HIV and the disease process. Focus will be around medication adherence and compliance by prefilling a pill box for the patient offering accountability and creating new consistent habits. RN will also assess for the need to include substance abuse counseling to address the patient's depressive feeling and substance use. Long term goal is for patient to be transitioned to bubble packaged prefilled medications through the pharmacy f. Equipment required: No additional Equipment needs at this time g. Functional Limitations:No noted functional limitations h. Rehabilitation potential:  Guarded i. Diet and Nutritional Needs: Low sodium diet j. Medications and treatments: Medications have been reconciled/reviewed and are a part of this [employer] k. Specific therapies if needed: RN l. Pertinent diagnoses: HIV Disease. HIV Dementia, Depression, Multiple Substance Abuse m. Expected outcome: Guarded based on the patient's motivation and ability to stay engaged with services

## 2014-12-08 NOTE — Patient Instructions (Addendum)
RN met with client and or caregiver for assessment. RN reviewed Transport planner, [place], Home Safety Management Information Booklet. Home Fire Safety Assessment, Fall Risk Assessment and Suicide Risk Assessment was performed. RN also discussed information on a Living Will, Advanced Directives, and [place]. RN and Client/Designated Party educated/reviewed/signed Client Agreement and Consent for Service form along with Patient Rights and Responsibilities statement. RN developed patient specific and centered care plan. RN provided contact information and reviewed how to receive emergency help after hours for schedule changes, billing questions, reporting of safety issues, falls, concerns or any needs/questions. Standard Precaution and Infection control along with interventions to correct or prevent high risk behaviors instructed to the patient. Client/Caregiver reports understanding and agreement with the above. During today's visit RN arranged the patient's medications buy removing all the old and expired medications from the current regimen. RN contact [employer] and requested a fill of the patient's new medications (per Dr Arlyss Queen note:Change to Prezcobix and [name] from [name], Prezista and Norvir).Plan is to return tomorrow and prefill pillbox for the patient

## 2014-12-10 ENCOUNTER — Other Ambulatory Visit: Payer: 59 | Admitting: *Deleted

## 2014-12-10 NOTE — Progress Notes (Signed)
Patient ID: Peter Cox, male   DOB: [birth year], 54 y.o.   MRN: [id number] RN traveled to the patient's home to assist the patient with managing and setting up his medications properly. After knocking respectfully several times, no answer noted. RN will attempt to contact the patient and pre-arrange visit. Patient does not have a number to contact him prior to visit. The numbers listed on the chart are the patient's friend [name] and she is also have trouble getting in contact with the patient

## 2014-12-12 ENCOUNTER — Ambulatory Visit: Payer: 59 | Admitting: *Deleted

## 2014-12-15 ENCOUNTER — Encounter: Payer: Self-pay | Admitting: *Deleted

## 2014-12-17 ENCOUNTER — Telehealth: Payer: Self-pay | Admitting: *Deleted

## 2014-12-17 NOTE — Progress Notes (Signed)
Patient ID: Peter Cox, male   DOB: 1960/09/15, 54 y.o.   MRN: [id number] Pt does not have a working phone so RN attempted a drive by and the patient was not home. Pt's number listed on the chart is his friends and she has been having trouble contacting the patient.   Purpose of this communication is to relay that that the set frequency for home visits this week has been changed due to a missed visit. Communication has been made with the patient to ensure needs have been met and to offer a home visit. At this time a return call has not been received before the business week is out. Next week the [facility] Nurse plans to continue contact with the patient to offer any services or attempt to address any needs the patient expresses that is reasonable.

## 2014-12-17 NOTE — Telephone Encounter (Signed)
Contacted Ms. Peter Cox and asked if she has been able to locate the patient. Informed Ms Peter Cox that I went over to the patient's home and was unable to locate him. Ms. Peter Cox stated the patient has been in Peter Cox at his sister's home and will call the sister for me.  Pt returned my call stating she cannot locate the sister's number at this time but she can drive over to the pt's sister's home. RN informed Ms. Peter Cox that she does not have to go to such lengths but if she speaks with the patient to please let him know that I am trying to get in contact with him. Ms Peter Cox said right now Peter Cox is drinking heavily and does not see the importance in changing. She stated she has to locate him tomorrow to assist him with paying his bills and will call me at that time. RN informed Ms. Peter Cox that if the pt is not dedicated to complying with his medications I cannot continue to "hunt" him down. Ms. Peter Cox stating she completely agreed and that the time could be better spent with someone who wants and needs the help

## 2014-12-17 NOTE — Progress Notes (Addendum)
Patient ID: Peter Cox, male   DOB: 10/22/1960, 54 y.o.   MRN: [id number] Purpose of this communication is to relay that that the set frequency for home visits this week has been changed due to a missed visit. Communication has been made with the patient to ensure needs have been met and to offer a home visit. At this time a return call has not been received before the business week is out. Next week the [place] Nurse plans to continue contact with the patient to offer any services or attempt to address any needs the patient expresses that is reasonable.   DATE CORRECTION: Attempted visit was made on 06/22 not 06/24 as the note states

## 2015-01-19 ENCOUNTER — Ambulatory Visit: Payer: Self-pay | Admitting: *Deleted

## 2015-01-19 DIAGNOSIS — B20 Human immunodeficiency virus [HIV] disease: Secondary | ICD-10-CM

## 2015-01-19 DIAGNOSIS — Z9114 Patient's other noncompliance with medication regimen: Secondary | ICD-10-CM

## 2015-01-20 NOTE — Patient Instructions (Signed)
Please refer to progress note.

## 2015-01-20 NOTE — Progress Notes (Signed)
Patient ID: Peter Cox, male   DOB: 1961-01-28, 54 y.o.   MRN: [id number] RN arrived at the patient's home between the hours of 5:20pm and 5:30pm. RN could hear a lot of yelling coming from the home and objects being moved. A male voice was heard saying "I am sick of this shit. I can do whatever the hell I want to do. RN did not hear any signs of physical abuse but the conversation appeared intense. This was not a good time to speak with the patient about medication adherence. RN did not knock on the door and left the residence at this time. RN will attempt to contact the patient by phone before discharging the patient due to lack of engagement

## 2015-01-28 ENCOUNTER — Other Ambulatory Visit: Payer: Self-pay | Admitting: Internal Medicine

## 2015-02-03 ENCOUNTER — Other Ambulatory Visit: Payer: Self-pay | Admitting: Internal Medicine

## 2015-02-12 ENCOUNTER — Encounter: Payer: Self-pay | Admitting: *Deleted

## 2015-02-12 NOTE — Progress Notes (Signed)
Patient ID: Peter Cox, male   DOB: 13-Jan-1961, 54 y.o.   MRN: [id number] The intent of this communication is to inform the Health Care Team that this patient will be discharged from [facility]).  Greater than 5 attempts have been made to re-engage since intial visit to the patient's home on 12/04/2014. Attempts include telephone calls to the patient, his emergency contact and drives to his home with out success. Based on this information the services will be terminated since the patient refuses to honor client responsibilities as set forth in Client Rights and Responsibilities statement.  Moving forward, the [facility] will be willing to reopen the patient to services if and when the patient is ready to discuss medication adherence and HIV disease management. Effective 02/12/2015 patient will be discharged and removed from [facility] active patient listing.

## 2015-02-12 NOTE — Progress Notes (Signed)
I am very sorry. hOpefully his "friend" will help him get re-engaged at some point. He usually comes back around eventually

## 2015-04-13 ENCOUNTER — Other Ambulatory Visit: Payer: Self-pay

## 2015-04-14 MED ORDER — LISINOPRIL 20 MG PO TABS: ORAL_TABLET | ORAL | Status: DC

## 2015-04-23 ENCOUNTER — Other Ambulatory Visit: Payer: Self-pay | Admitting: Internal Medicine

## 2015-05-11 ENCOUNTER — Encounter: Payer: Self-pay | Admitting: Internal Medicine

## 2015-05-11 ENCOUNTER — Encounter: Payer: 59 | Admitting: Internal Medicine

## 2015-06-04 ENCOUNTER — Other Ambulatory Visit: Payer: Self-pay | Admitting: *Deleted

## 2015-06-04 MED ORDER — LISINOPRIL 20 MG PO TABS: ORAL_TABLET | ORAL | Status: DC

## 2015-07-31 ENCOUNTER — Other Ambulatory Visit: Payer: Self-pay

## 2015-07-31 NOTE — Telephone Encounter (Signed)
Have requested pt be seen prior to any additional refills.  MANY no shows.  No appointments completed since 10/2014

## 2015-08-02 NOTE — Telephone Encounter (Signed)
Hi, I am not able to refill this medicine. Patient has not been seen in our clinic since April 2016.  His hypertension needs to be re-assessed prior to refilling any meds as they may need to be adjusted.  Thanking you Jeanette Moffatt

## 2015-08-17 ENCOUNTER — Other Ambulatory Visit: Payer: Self-pay | Admitting: Infectious Disease

## 2015-08-17 DIAGNOSIS — B20 Human immunodeficiency virus [HIV] disease: Secondary | ICD-10-CM

## 2015-08-27 ENCOUNTER — Ambulatory Visit: Payer: 59 | Admitting: Internal Medicine

## 2015-08-27 ENCOUNTER — Other Ambulatory Visit: Payer: 59

## 2015-08-27 ENCOUNTER — Encounter: Payer: Self-pay | Admitting: Internal Medicine

## 2015-08-31 ENCOUNTER — Other Ambulatory Visit: Payer: Self-pay | Admitting: Infectious Disease

## 2015-08-31 ENCOUNTER — Other Ambulatory Visit: Payer: Medicare Other

## 2015-08-31 DIAGNOSIS — Z79899 Other long term (current) drug therapy: Secondary | ICD-10-CM

## 2015-08-31 DIAGNOSIS — B20 Human immunodeficiency virus [HIV] disease: Secondary | ICD-10-CM

## 2015-08-31 LAB — CBC WITH DIFFERENTIAL/PLATELET
Basophils Absolute: 0 10*3/uL (ref 0.0–0.1)
Basophils Relative: 1 % (ref 0–1)
Eosinophils Absolute: 0.3 10*3/uL (ref 0.0–0.7)
Eosinophils Relative: 7 % — ABNORMAL HIGH (ref 0–5)
HCT: 43.2 % (ref 39.0–52.0)
Hemoglobin: 14.5 g/dL (ref 13.0–17.0)
Lymphocytes Relative: 32 % (ref 12–46)
Lymphs Abs: 1.5 10*3/uL (ref 0.7–4.0)
MCH: 29.5 pg (ref 26.0–34.0)
MCHC: 33.6 g/dL (ref 30.0–36.0)
MCV: 87.8 fL (ref 78.0–100.0)
MPV: 11.1 fL (ref 8.6–12.4)
Monocytes Absolute: 0.3 10*3/uL (ref 0.1–1.0)
Monocytes Relative: 6 % (ref 3–12)
Neutro Abs: 2.5 10*3/uL (ref 1.7–7.7)
Neutrophils Relative %: 54 % (ref 43–77)
Platelets: 173 10*3/uL (ref 150–400)
RBC: 4.92 MIL/uL (ref 4.22–5.81)
RDW: 14.9 % (ref 11.5–15.5)
WBC: 4.6 10*3/uL (ref 4.0–10.5)

## 2015-08-31 LAB — LIPID PANEL
Cholesterol: 176 mg/dL (ref 125–200)
HDL: 48 mg/dL (ref >=40)
LDL Cholesterol: 93 mg/dL (ref <130)
Total CHOL/HDL Ratio: 3.7 Ratio (ref <=5.0)
Triglycerides: 173 mg/dL — ABNORMAL HIGH (ref <150)
VLDL: 35 mg/dL — ABNORMAL HIGH (ref <30)

## 2015-08-31 LAB — COMPLETE METABOLIC PANEL WITH GFR
ALT: 14 U/L (ref 9–46)
AST: 14 U/L (ref 10–35)
Albumin: 4 g/dL (ref 3.6–5.1)
Alkaline Phosphatase: 104 U/L (ref 40–115)
BUN: 17 mg/dL (ref 7–25)
CO2: 29 mmol/L (ref 20–31)
Calcium: 9.4 mg/dL (ref 8.6–10.3)
Chloride: 106 mmol/L (ref 98–110)
Creat: 1.38 mg/dL — ABNORMAL HIGH (ref 0.70–1.33)
GFR, Est African American: 66 mL/min (ref >=60)
GFR, Est Non African American: 57 mL/min — ABNORMAL LOW (ref >=60)
Glucose, Bld: 54 mg/dL — ABNORMAL LOW (ref 65–99)
Potassium: 4.3 mmol/L (ref 3.5–5.3)
Sodium: 140 mmol/L (ref 135–146)
Total Bilirubin: 0.4 mg/dL (ref 0.2–1.2)
Total Protein: 7.8 g/dL (ref 6.1–8.1)

## 2015-09-01 LAB — RPR

## 2015-09-01 NOTE — Addendum Note (Signed)
Addended by: Dolan Amen D on: 09/01/2015 09:17 AM   Modules accepted: Orders

## 2015-09-02 LAB — HIV-1 RNA QUANT-NO REFLEX-BLD
HIV 1 RNA Quant: 36848 copies/mL — ABNORMAL HIGH (ref <20)
HIV-1 RNA Quant, Log: 4.57 Log copies/mL — ABNORMAL HIGH (ref <1.30)

## 2015-09-02 LAB — T-HELPER CELL (CD4) - (RCID CLINIC ONLY)
CD4 % Helper T Cell: 34 % (ref 33–55)
CD4 T Cell Abs: 500 /uL (ref 400–2700)

## 2015-09-08 NOTE — Addendum Note (Signed)
Addended by: Lorne Skeens D on: 09/08/2015 10:02 AM   Modules accepted: Orders

## 2015-09-08 NOTE — Addendum Note (Signed)
Addended by: Lorne Skeens D on: 09/08/2015 10:04 AM   Modules accepted: Orders

## 2015-09-10 ENCOUNTER — Encounter: Payer: Self-pay | Admitting: Infectious Disease

## 2015-09-10 ENCOUNTER — Other Ambulatory Visit: Payer: Self-pay | Admitting: *Deleted

## 2015-09-10 ENCOUNTER — Ambulatory Visit (INDEPENDENT_AMBULATORY_CARE_PROVIDER_SITE_OTHER): Payer: Medicare Other | Admitting: Infectious Disease

## 2015-09-10 ENCOUNTER — Ambulatory Visit: Payer: Medicare Other | Admitting: *Deleted

## 2015-09-10 VITALS — BP 238/135 | HR 73 | Temp 97.6°F | Wt 128.0 lb

## 2015-09-10 DIAGNOSIS — F01518 Vascular dementia, unspecified severity, with other behavioral disturbance: Secondary | ICD-10-CM

## 2015-09-10 DIAGNOSIS — F1010 Alcohol abuse, uncomplicated: Secondary | ICD-10-CM

## 2015-09-10 DIAGNOSIS — F12929 Cannabis use, unspecified with intoxication, unspecified: Secondary | ICD-10-CM

## 2015-09-10 DIAGNOSIS — I160 Hypertensive urgency: Secondary | ICD-10-CM

## 2015-09-10 DIAGNOSIS — E785 Hyperlipidemia, unspecified: Secondary | ICD-10-CM

## 2015-09-10 DIAGNOSIS — N182 Chronic kidney disease, stage 2 (mild): Secondary | ICD-10-CM

## 2015-09-10 DIAGNOSIS — I10 Essential (primary) hypertension: Secondary | ICD-10-CM | POA: Diagnosis not present

## 2015-09-10 DIAGNOSIS — Z720 Tobacco use: Secondary | ICD-10-CM

## 2015-09-10 DIAGNOSIS — Z8673 Personal history of transient ischemic attack (TIA), and cerebral infarction without residual deficits: Secondary | ICD-10-CM

## 2015-09-10 DIAGNOSIS — F17200 Nicotine dependence, unspecified, uncomplicated: Secondary | ICD-10-CM

## 2015-09-10 DIAGNOSIS — F1910 Other psychoactive substance abuse, uncomplicated: Secondary | ICD-10-CM

## 2015-09-10 DIAGNOSIS — I6320 Cerebral infarction due to unspecified occlusion or stenosis of unspecified precerebral arteries: Secondary | ICD-10-CM

## 2015-09-10 DIAGNOSIS — F0151 Vascular dementia with behavioral disturbance: Secondary | ICD-10-CM

## 2015-09-10 DIAGNOSIS — B20 Human immunodeficiency virus [HIV] disease: Secondary | ICD-10-CM

## 2015-09-10 DIAGNOSIS — F0280 Dementia in other diseases classified elsewhere without behavioral disturbance: Secondary | ICD-10-CM

## 2015-09-10 MED ORDER — PRAVASTATIN SODIUM 40 MG PO TABS: 40.0000 mg | ORAL_TABLET | Freq: Every day | ORAL | Status: DC

## 2015-09-10 MED ORDER — AMLODIPINE BESYLATE 10 MG PO TABS: 10.0000 mg | ORAL_TABLET | Freq: Every day | ORAL | Status: DC

## 2015-09-10 MED ORDER — EMTRICITABINE-TENOFOVIR AF 200-25 MG PO TABS: 1.0000 | ORAL_TABLET | Freq: Every day | ORAL | Status: DC

## 2015-09-10 MED ORDER — LISINOPRIL 20 MG PO TABS: ORAL_TABLET | ORAL | Status: DC

## 2015-09-10 MED ORDER — DARUNAVIR-COBICISTAT 800-150 MG PO TABS: 1.0000 | ORAL_TABLET | Freq: Every day | ORAL | Status: DC

## 2015-09-10 MED ORDER — CLONIDINE HCL 0.1 MG PO TABS
0.2000 mg | ORAL_TABLET | Freq: Once | ORAL | Status: AC
  Administered 2015-09-10: 0.2 mg via ORAL

## 2015-09-10 NOTE — Progress Notes (Signed)
Chief complaint: followup for HIV and HTN  Subjective:    Patient ID: Peter Cox, male    DOB: 10-15-1960, 55 y.o.   MRN: [id number]  HPI  55 year old with intermittently controlled HIV, HTN with prior CVA whose HIV has come completely out of control yet again. His BP is also severely elevated. He is accompanied by his friend today. He had been followed by Kinnie Scales but then stopped being responsive to  Assistance. I suspect that much of this is due relapse of drug use. He endorses marijuana but I suspect cocaine and alcohol are also involved yet again.  Past Medical History  Diagnosis Date  . Hypertension   . Cocaine abuse      clean since about 2000  . Marijuana abuse   . Alcohol abuse     heavy, clean since April  . Tobacco abuse   . Mild cognitive impairment     likely vascular dementia/psa/?HIV componentCT Head 02/12/08 for htn crisis and concern for stroke: impression-1. Old right internal capsule and thalamic lacunar infarcts. 2. Mild to moderate chronic small vessell white matter ischemic changes in both cerebral hemisphere, greater on the right  . HIV infection ([place])     dx 08/08/2008 (unknown how he was exposed)  Initial VL 43,200 and CD4 410 on 08/28/08  . Hyperlipidemia   . Neuromuscular disorder ([place])   . Stroke ([place])   . Seizures ([place])     "last year had seizure"  . Drug abuse 11/24/2014  . Alcohol abuse 11/24/2014    Past Surgical History  Procedure Laterality Date  . Hemorrhoid surgery    . Closed reduction patellar      right knee  . Shoulder arthroscopy  01/2008    w/extensive debridement (Dr Mardelle Matte)    Family History  Problem Relation Age of Onset  . Diabetes Mother   . Hypertension Mother   . Stroke Mother   . Diabetes Father   . Hypertension Sister   . Hypertension Sister   . Colon cancer Neg Hx       Social History   Social History  . Marital Status: Single    Spouse Name: N/A  . Number of Children: N/A  . Years of Education: N/A    Occupational History  . Unemployed    Social History Main Topics  . Smoking status: Current Every Day Smoker -- 0.50 packs/day for 40 years    Types: Cigarettes  . Smokeless tobacco: Never Used     Comment: Quit  x 2 week.  . Alcohol Use: 0.0 oz/week    0 Standard drinks or equivalent per week     Comment: drink beer  . Drug Use: 7.00 per week    Special: Marijuana     Comment: use Marijuana every day per pt.  . Sexual Activity: Not Currently    Birth Control/ Protection: Condom     Comment: given 2 bags of condoms   Other Topics Concern  . None   Social History Narrative   Lives alone in [place].  Male friend [name]) and her family assist the patient with home management.  They have been friends x 85yrs but are no longer sexually active.  Not working. 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  amLODipine (NORVASC) 10 MG tablet, Take 1 tablet (10  mg total) by mouth daily., Disp: 30 tablet, Rfl: 12 .  aspirin 81 MG tablet, Take 1 tablet (81 mg total) by mouth daily., Disp: , Rfl:  .  darunavir-cobicistat (PREZCOBIX) 800-150 MG tablet, Take 1 tablet by mouth daily., Disp: 30 tablet, Rfl: 11 .  escitalopram (LEXAPRO) 10 MG tablet, Take 1 tablet (10 mg total) by mouth daily., Disp: 30 tablet, Rfl: 11 .  lisinopril (PRINIVIL,ZESTRIL) 20 MG tablet, TAKE 1 TABLET (20 MG TOTAL) BY MOUTH DAILY., Disp: 30 tablet, Rfl: 1 .  pravastatin (PRAVACHOL) 40 MG tablet, Take 1 tablet (40 mg total) by mouth at bedtime., Disp: 60 tablet, Rfl: 11 .  triamcinolone ointment (KENALOG) 0.5 %, Apply topically 2 (two) times daily., Disp: 30 g, Rfl: 1 .  emtricitabine-tenofovir AF (DESCOVY) 200-25 MG tablet, Take 1 tablet by mouth daily., Disp: 30 tablet, Rfl: 11     Review of Systems  Constitutional: Negative for fever, diaphoresis and fatigue.  HENT: Positive for hearing loss and  postnasal drip. Negative for nosebleeds.   Respiratory: Negative for cough, chest tightness, shortness of breath, wheezing and stridor.   Cardiovascular: Negative for chest pain and palpitations.  Gastrointestinal: Negative for abdominal pain and abdominal distention.  Skin: Negative for rash.  Neurological: Negative for tremors, seizures, speech difficulty, weakness and headaches.  Psychiatric/Behavioral: Positive for behavioral problems, dysphoric mood and decreased concentration. Negative for hallucinations and confusion. The patient is not nervous/anxious.        Objective:   Physical Exam  Constitutional: He is oriented to person, place, and time.  HENT:  Head: Normocephalic and atraumatic.  Mouth/Throat: No oropharyngeal exudate.  Eyes: Conjunctivae and EOM are normal.  Neck: Normal range of motion. Neck supple.  Cardiovascular: Normal rate and regular rhythm.   Pulmonary/Chest: Effort normal. No respiratory distress. He has no wheezes.  Abdominal: Soft. He exhibits no distension.  Musculoskeletal: Normal range of motion. He exhibits no edema or tenderness.  Neurological: He is alert and oriented to person, place, and time.  Skin: Skin is warm and dry. No rash noted. No erythema. No pallor.  Psychiatric: His mood appears not anxious. His speech is delayed.          Assessment & Plan:   HIV. : Poorly controlled yet again.   Lab Results  Component Value Date   HIV1RNAQUANT [id number]* 08/31/2015   HIV1RNAQUANT 12019* 11/24/2014   HIV1RNAQUANT 216* 08/11/2014   Lab Results  Component Value Date   CD4TABS 500 08/31/2015   CD4TABS 620 11/24/2014   CD4TABS 480 08/11/2014   Fortunately CD4 has not dropped yet.  He is going to have to have his substance abuse come under control. I am not confident he will be able to do well in current situation. He may need intensive rehab and or supervised care also due to his dementia  Will bring him back in a week to go over meds with  Minh  Polysubstance abuse: see above and had him meet with Jodie  Severe HTN with Hypertensive crisis: gave clonidine 0.2mg  with drop in bp to [id number] systolic  Hx of CVA: needs to also be on statin, asa and have bp control  HIV dementia: he needs a lot of Help. I could see if he would qualify for HIV neurocongitive study with addition of INSTI vs Maraviroc but he would seem poor candidate due to his terrible compliance an dhis hx of CVAs might be confounder and  ? Exclusion   I spent greater than 40 minutes with  the patient   including greater than 50% of time in face to face counsel of the patient and his friend re his HIV, polysubstance abuse, HTNsive urgency, CVA  and in coordination of his care.

## 2015-09-10 NOTE — BH Specialist Note (Signed)
Counselor met with patient today in the exam room per Dr. Drucilla Schmidt request for non compliance of medication and treatment.  Patient was oriented times four with flat affect but proper dress.  Patient was accompanied by his roommate and care taker.  Patient did not talk much and counselor had to really prompt patient to share.  Patient was smiling and agreeing to most everything discussed but counselor could not tell if it was genuine or not.  Counselor encouraged patient to meet with counselor to better process what was going on in his life right now regarding his non compliance with treatment and substance abuse. Counselor provided support and encouragement for patient.  Counselor recommended that patient make an appointment to meet for counseling services.  Patient agreed that he would make an appointment when he checked out today.   Rolena Infante, MA Alcohol and drug Services/[facility]

## 2015-09-10 NOTE — Patient Instructions (Signed)
I want you to meet with [facility] or Cassie Stewart NEXT WEEK AND BRING ALL OF YOUR MEDICINES WITH YOU  I NEED REPEAT LABS IN ONE MONTH AFTER TAKING MEDS AND APPT WITH ME IN 5 WEEKS  WE MAY NEED SEVERAL VISITS IN BETWEEN WITH JODI, [facility], CASE MANAGEMENT IN BETWEEN

## 2015-09-17 LAB — HIV-1 GENOTYPR PLUS

## 2015-10-01 ENCOUNTER — Other Ambulatory Visit: Payer: Self-pay

## 2015-10-15 ENCOUNTER — Ambulatory Visit: Payer: Medicare Other | Admitting: Infectious Disease

## 2015-10-19 ENCOUNTER — Encounter: Payer: 59 | Admitting: Internal Medicine

## 2015-11-30 ENCOUNTER — Other Ambulatory Visit: Payer: Self-pay | Admitting: *Deleted

## 2015-11-30 DIAGNOSIS — I10 Essential (primary) hypertension: Secondary | ICD-10-CM

## 2015-11-30 MED ORDER — LISINOPRIL 20 MG PO TABS: ORAL_TABLET | ORAL | Status: DC

## 2016-05-23 ENCOUNTER — Encounter: Payer: Medicare Other | Admitting: Internal Medicine

## 2016-07-03 DIAGNOSIS — G9340 Encephalopathy, unspecified: Secondary | ICD-10-CM | POA: Diagnosis not present

## 2016-07-03 DIAGNOSIS — B59 Pneumocystosis: Secondary | ICD-10-CM | POA: Diagnosis not present

## 2016-07-03 DIAGNOSIS — R262 Difficulty in walking, not elsewhere classified: Secondary | ICD-10-CM | POA: Diagnosis not present

## 2016-07-03 DIAGNOSIS — J181 Lobar pneumonia, unspecified organism: Secondary | ICD-10-CM | POA: Diagnosis not present

## 2016-07-03 DIAGNOSIS — R319 Hematuria, unspecified: Secondary | ICD-10-CM | POA: Diagnosis present

## 2016-07-03 DIAGNOSIS — E860 Dehydration: Secondary | ICD-10-CM | POA: Diagnosis present

## 2016-07-03 DIAGNOSIS — R4182 Altered mental status, unspecified: Secondary | ICD-10-CM | POA: Diagnosis not present

## 2016-07-03 DIAGNOSIS — K644 Residual hemorrhoidal skin tags: Secondary | ICD-10-CM | POA: Diagnosis present

## 2016-07-03 DIAGNOSIS — R0603 Acute respiratory distress: Secondary | ICD-10-CM | POA: Diagnosis not present

## 2016-07-03 DIAGNOSIS — N289 Disorder of kidney and ureter, unspecified: Secondary | ICD-10-CM | POA: Diagnosis not present

## 2016-07-03 DIAGNOSIS — I129 Hypertensive chronic kidney disease with stage 1 through stage 4 chronic kidney disease, or unspecified chronic kidney disease: Secondary | ICD-10-CM | POA: Diagnosis present

## 2016-07-03 DIAGNOSIS — B20 Human immunodeficiency virus [HIV] disease: Secondary | ICD-10-CM | POA: Diagnosis present

## 2016-07-03 DIAGNOSIS — R7989 Other specified abnormal findings of blood chemistry: Secondary | ICD-10-CM | POA: Diagnosis not present

## 2016-07-03 DIAGNOSIS — R0602 Shortness of breath: Secondary | ICD-10-CM | POA: Diagnosis not present

## 2016-07-03 DIAGNOSIS — R197 Diarrhea, unspecified: Secondary | ICD-10-CM | POA: Diagnosis not present

## 2016-07-03 DIAGNOSIS — R404 Transient alteration of awareness: Secondary | ICD-10-CM | POA: Diagnosis not present

## 2016-07-03 DIAGNOSIS — A09 Infectious gastroenteritis and colitis, unspecified: Secondary | ICD-10-CM | POA: Diagnosis present

## 2016-07-03 DIAGNOSIS — F17200 Nicotine dependence, unspecified, uncomplicated: Secondary | ICD-10-CM | POA: Diagnosis present

## 2016-07-03 DIAGNOSIS — R0902 Hypoxemia: Secondary | ICD-10-CM | POA: Diagnosis not present

## 2016-07-03 DIAGNOSIS — Z8673 Personal history of transient ischemic attack (TIA), and cerebral infarction without residual deficits: Secondary | ICD-10-CM | POA: Diagnosis not present

## 2016-07-03 DIAGNOSIS — J9601 Acute respiratory failure with hypoxia: Secondary | ICD-10-CM | POA: Diagnosis present

## 2016-07-03 DIAGNOSIS — N189 Chronic kidney disease, unspecified: Secondary | ICD-10-CM | POA: Diagnosis present

## 2016-07-03 DIAGNOSIS — I10 Essential (primary) hypertension: Secondary | ICD-10-CM | POA: Diagnosis present

## 2016-07-03 DIAGNOSIS — K921 Melena: Secondary | ICD-10-CM | POA: Diagnosis not present

## 2016-07-03 DIAGNOSIS — E872 Acidosis: Secondary | ICD-10-CM | POA: Diagnosis present

## 2016-07-03 DIAGNOSIS — R809 Proteinuria, unspecified: Secondary | ICD-10-CM | POA: Diagnosis present

## 2016-07-03 DIAGNOSIS — R41841 Cognitive communication deficit: Secondary | ICD-10-CM | POA: Diagnosis not present

## 2016-07-03 DIAGNOSIS — R030 Elevated blood-pressure reading, without diagnosis of hypertension: Secondary | ICD-10-CM | POA: Diagnosis not present

## 2016-07-03 DIAGNOSIS — J189 Pneumonia, unspecified organism: Secondary | ICD-10-CM | POA: Diagnosis not present

## 2016-07-03 DIAGNOSIS — N170 Acute kidney failure with tubular necrosis: Secondary | ICD-10-CM | POA: Diagnosis not present

## 2016-07-03 DIAGNOSIS — K649 Unspecified hemorrhoids: Secondary | ICD-10-CM | POA: Diagnosis not present

## 2016-07-03 DIAGNOSIS — J09X2 Influenza due to identified novel influenza A virus with other respiratory manifestations: Secondary | ICD-10-CM | POA: Diagnosis not present

## 2016-07-03 DIAGNOSIS — J09X1 Influenza due to identified novel influenza A virus with pneumonia: Secondary | ICD-10-CM | POA: Diagnosis not present

## 2016-07-03 DIAGNOSIS — J168 Pneumonia due to other specified infectious organisms: Secondary | ICD-10-CM | POA: Diagnosis not present

## 2016-07-03 DIAGNOSIS — R531 Weakness: Secondary | ICD-10-CM | POA: Diagnosis not present

## 2016-07-03 DIAGNOSIS — N179 Acute kidney failure, unspecified: Secondary | ICD-10-CM | POA: Diagnosis not present

## 2016-07-03 DIAGNOSIS — J1008 Influenza due to other identified influenza virus with other specified pneumonia: Secondary | ICD-10-CM | POA: Diagnosis not present

## 2016-07-03 DIAGNOSIS — D649 Anemia, unspecified: Secondary | ICD-10-CM | POA: Diagnosis present

## 2016-07-03 DIAGNOSIS — R509 Fever, unspecified: Secondary | ICD-10-CM | POA: Diagnosis not present

## 2016-07-03 DIAGNOSIS — K625 Hemorrhage of anus and rectum: Secondary | ICD-10-CM | POA: Diagnosis not present

## 2016-07-03 DIAGNOSIS — M6281 Muscle weakness (generalized): Secondary | ICD-10-CM | POA: Diagnosis not present

## 2016-07-13 DIAGNOSIS — N189 Chronic kidney disease, unspecified: Secondary | ICD-10-CM | POA: Diagnosis not present

## 2016-07-13 DIAGNOSIS — J189 Pneumonia, unspecified organism: Secondary | ICD-10-CM | POA: Diagnosis not present

## 2016-07-13 DIAGNOSIS — R531 Weakness: Secondary | ICD-10-CM | POA: Diagnosis not present

## 2016-07-13 DIAGNOSIS — N289 Disorder of kidney and ureter, unspecified: Secondary | ICD-10-CM | POA: Diagnosis not present

## 2016-07-13 DIAGNOSIS — G9340 Encephalopathy, unspecified: Secondary | ICD-10-CM | POA: Diagnosis not present

## 2016-07-13 DIAGNOSIS — R7309 Other abnormal glucose: Secondary | ICD-10-CM | POA: Diagnosis not present

## 2016-07-13 DIAGNOSIS — R4182 Altered mental status, unspecified: Secondary | ICD-10-CM | POA: Diagnosis not present

## 2016-07-13 DIAGNOSIS — K921 Melena: Secondary | ICD-10-CM | POA: Diagnosis not present

## 2016-07-13 DIAGNOSIS — Z113 Encounter for screening for infections with a predominantly sexual mode of transmission: Secondary | ICD-10-CM | POA: Diagnosis not present

## 2016-07-13 DIAGNOSIS — J181 Lobar pneumonia, unspecified organism: Secondary | ICD-10-CM | POA: Diagnosis not present

## 2016-07-13 DIAGNOSIS — I10 Essential (primary) hypertension: Secondary | ICD-10-CM | POA: Diagnosis not present

## 2016-07-13 DIAGNOSIS — J09X2 Influenza due to identified novel influenza A virus with other respiratory manifestations: Secondary | ICD-10-CM | POA: Diagnosis not present

## 2016-07-13 DIAGNOSIS — J09X1 Influenza due to identified novel influenza A virus with pneumonia: Secondary | ICD-10-CM | POA: Diagnosis not present

## 2016-07-13 DIAGNOSIS — B20 Human immunodeficiency virus [HIV] disease: Secondary | ICD-10-CM | POA: Diagnosis not present

## 2016-07-13 DIAGNOSIS — Z720 Tobacco use: Secondary | ICD-10-CM | POA: Diagnosis not present

## 2016-07-13 DIAGNOSIS — R262 Difficulty in walking, not elsewhere classified: Secondary | ICD-10-CM | POA: Diagnosis not present

## 2016-07-13 DIAGNOSIS — B59 Pneumocystosis: Secondary | ICD-10-CM | POA: Diagnosis not present

## 2016-07-13 DIAGNOSIS — M6281 Muscle weakness (generalized): Secondary | ICD-10-CM | POA: Diagnosis not present

## 2016-07-13 DIAGNOSIS — I639 Cerebral infarction, unspecified: Secondary | ICD-10-CM | POA: Diagnosis not present

## 2016-07-13 DIAGNOSIS — N179 Acute kidney failure, unspecified: Secondary | ICD-10-CM | POA: Diagnosis not present

## 2016-07-13 DIAGNOSIS — J168 Pneumonia due to other specified infectious organisms: Secondary | ICD-10-CM | POA: Diagnosis not present

## 2016-07-13 DIAGNOSIS — K649 Unspecified hemorrhoids: Secondary | ICD-10-CM | POA: Diagnosis not present

## 2016-07-13 DIAGNOSIS — R197 Diarrhea, unspecified: Secondary | ICD-10-CM | POA: Diagnosis not present

## 2016-07-13 DIAGNOSIS — R41841 Cognitive communication deficit: Secondary | ICD-10-CM | POA: Diagnosis not present

## 2016-07-13 DIAGNOSIS — I129 Hypertensive chronic kidney disease with stage 1 through stage 4 chronic kidney disease, or unspecified chronic kidney disease: Secondary | ICD-10-CM | POA: Diagnosis not present

## 2016-07-13 DIAGNOSIS — K625 Hemorrhage of anus and rectum: Secondary | ICD-10-CM | POA: Diagnosis not present

## 2016-07-13 DIAGNOSIS — J9601 Acute respiratory failure with hypoxia: Secondary | ICD-10-CM | POA: Diagnosis not present

## 2016-07-20 DIAGNOSIS — R197 Diarrhea, unspecified: Secondary | ICD-10-CM | POA: Diagnosis not present

## 2016-07-20 DIAGNOSIS — B20 Human immunodeficiency virus [HIV] disease: Secondary | ICD-10-CM | POA: Diagnosis not present

## 2016-07-20 DIAGNOSIS — N179 Acute kidney failure, unspecified: Secondary | ICD-10-CM | POA: Diagnosis not present

## 2016-07-22 DIAGNOSIS — I10 Essential (primary) hypertension: Secondary | ICD-10-CM | POA: Diagnosis not present

## 2016-07-22 DIAGNOSIS — Z720 Tobacco use: Secondary | ICD-10-CM | POA: Diagnosis not present

## 2016-07-22 DIAGNOSIS — I639 Cerebral infarction, unspecified: Secondary | ICD-10-CM | POA: Diagnosis not present

## 2016-07-22 DIAGNOSIS — N179 Acute kidney failure, unspecified: Secondary | ICD-10-CM | POA: Diagnosis not present

## 2016-07-22 DIAGNOSIS — Z113 Encounter for screening for infections with a predominantly sexual mode of transmission: Secondary | ICD-10-CM | POA: Diagnosis not present

## 2016-07-22 DIAGNOSIS — B20 Human immunodeficiency virus [HIV] disease: Secondary | ICD-10-CM | POA: Diagnosis not present

## 2016-07-22 DIAGNOSIS — R7309 Other abnormal glucose: Secondary | ICD-10-CM | POA: Diagnosis not present

## 2016-07-22 DIAGNOSIS — B59 Pneumocystosis: Secondary | ICD-10-CM | POA: Diagnosis not present

## 2016-08-23 DIAGNOSIS — B20 Human immunodeficiency virus [HIV] disease: Secondary | ICD-10-CM | POA: Diagnosis not present

## 2016-09-09 DIAGNOSIS — I10 Essential (primary) hypertension: Secondary | ICD-10-CM | POA: Diagnosis present

## 2016-09-09 DIAGNOSIS — Z8249 Family history of ischemic heart disease and other diseases of the circulatory system: Secondary | ICD-10-CM | POA: Diagnosis not present

## 2016-09-09 DIAGNOSIS — Z8701 Personal history of pneumonia (recurrent): Secondary | ICD-10-CM | POA: Diagnosis not present

## 2016-09-09 DIAGNOSIS — F17210 Nicotine dependence, cigarettes, uncomplicated: Secondary | ICD-10-CM | POA: Diagnosis present

## 2016-09-09 DIAGNOSIS — B20 Human immunodeficiency virus [HIV] disease: Secondary | ICD-10-CM | POA: Diagnosis present

## 2016-09-09 DIAGNOSIS — Z8673 Personal history of transient ischemic attack (TIA), and cerebral infarction without residual deficits: Secondary | ICD-10-CM | POA: Diagnosis not present

## 2016-09-09 DIAGNOSIS — N179 Acute kidney failure, unspecified: Secondary | ICD-10-CM | POA: Diagnosis not present

## 2016-09-09 DIAGNOSIS — J189 Pneumonia, unspecified organism: Secondary | ICD-10-CM | POA: Diagnosis not present

## 2016-09-09 DIAGNOSIS — I340 Nonrheumatic mitral (valve) insufficiency: Secondary | ICD-10-CM | POA: Diagnosis not present

## 2016-09-09 DIAGNOSIS — I160 Hypertensive urgency: Secondary | ICD-10-CM | POA: Diagnosis present

## 2016-09-09 DIAGNOSIS — J9811 Atelectasis: Secondary | ICD-10-CM | POA: Diagnosis not present

## 2016-09-09 DIAGNOSIS — N289 Disorder of kidney and ureter, unspecified: Secondary | ICD-10-CM | POA: Diagnosis present

## 2017-08-22 ENCOUNTER — Emergency Department (HOSPITAL_COMMUNITY): Payer: Medicare Other

## 2017-08-22 ENCOUNTER — Encounter (HOSPITAL_COMMUNITY): Payer: Self-pay | Admitting: Emergency Medicine

## 2017-08-22 ENCOUNTER — Inpatient Hospital Stay (HOSPITAL_COMMUNITY)
Admission: EM | Admit: 2017-08-22 | Discharge: 2017-08-25 | DRG: 305 | Disposition: A | Discharge status: Home / Self Care | Payer: Medicare Other | Attending: Internal Medicine | Admitting: Internal Medicine

## 2017-08-22 ENCOUNTER — Other Ambulatory Visit: Payer: Self-pay

## 2017-08-22 DIAGNOSIS — F1210 Cannabis abuse, uncomplicated: Secondary | ICD-10-CM | POA: Diagnosis present

## 2017-08-22 DIAGNOSIS — I10 Essential (primary) hypertension: Secondary | ICD-10-CM

## 2017-08-22 DIAGNOSIS — F419 Anxiety disorder, unspecified: Secondary | ICD-10-CM | POA: Diagnosis present

## 2017-08-22 DIAGNOSIS — F329 Major depressive disorder, single episode, unspecified: Secondary | ICD-10-CM | POA: Diagnosis present

## 2017-08-22 DIAGNOSIS — N183 Chronic kidney disease, stage 3 (moderate): Secondary | ICD-10-CM | POA: Diagnosis present

## 2017-08-22 DIAGNOSIS — F1420 Cocaine dependence, uncomplicated: Secondary | ICD-10-CM | POA: Diagnosis present

## 2017-08-22 DIAGNOSIS — D631 Anemia in chronic kidney disease: Secondary | ICD-10-CM | POA: Diagnosis present

## 2017-08-22 DIAGNOSIS — I129 Hypertensive chronic kidney disease with stage 1 through stage 4 chronic kidney disease, or unspecified chronic kidney disease: Secondary | ICD-10-CM | POA: Diagnosis present

## 2017-08-22 DIAGNOSIS — F1910 Other psychoactive substance abuse, uncomplicated: Secondary | ICD-10-CM | POA: Diagnosis present

## 2017-08-22 DIAGNOSIS — R253 Fasciculation: Secondary | ICD-10-CM | POA: Diagnosis present

## 2017-08-22 DIAGNOSIS — I161 Hypertensive emergency: Secondary | ICD-10-CM | POA: Diagnosis not present

## 2017-08-22 DIAGNOSIS — F14288 Cocaine dependence with other cocaine-induced disorder: Secondary | ICD-10-CM

## 2017-08-22 DIAGNOSIS — F1410 Cocaine abuse, uncomplicated: Secondary | ICD-10-CM | POA: Diagnosis present

## 2017-08-22 DIAGNOSIS — N179 Acute kidney failure, unspecified: Secondary | ICD-10-CM | POA: Diagnosis present

## 2017-08-22 DIAGNOSIS — F0150 Vascular dementia without behavioral disturbance: Secondary | ICD-10-CM | POA: Diagnosis present

## 2017-08-22 DIAGNOSIS — F17210 Nicotine dependence, cigarettes, uncomplicated: Secondary | ICD-10-CM | POA: Diagnosis present

## 2017-08-22 DIAGNOSIS — G465 Pure motor lacunar syndrome: Secondary | ICD-10-CM | POA: Diagnosis present

## 2017-08-22 DIAGNOSIS — G709 Myoneural disorder, unspecified: Secondary | ICD-10-CM | POA: Diagnosis present

## 2017-08-22 DIAGNOSIS — Z9114 Patient's other noncompliance with medication regimen: Secondary | ICD-10-CM

## 2017-08-22 DIAGNOSIS — F0281 Dementia in other diseases classified elsewhere with behavioral disturbance: Secondary | ICD-10-CM

## 2017-08-22 DIAGNOSIS — B20 Human immunodeficiency virus [HIV] disease: Secondary | ICD-10-CM | POA: Diagnosis present

## 2017-08-22 DIAGNOSIS — Z7982 Long term (current) use of aspirin: Secondary | ICD-10-CM

## 2017-08-22 DIAGNOSIS — F0280 Dementia in other diseases classified elsewhere without behavioral disturbance: Secondary | ICD-10-CM | POA: Diagnosis present

## 2017-08-22 DIAGNOSIS — N189 Chronic kidney disease, unspecified: Secondary | ICD-10-CM

## 2017-08-22 DIAGNOSIS — I160 Hypertensive urgency: Secondary | ICD-10-CM | POA: Diagnosis present

## 2017-08-22 DIAGNOSIS — I69311 Memory deficit following cerebral infarction: Secondary | ICD-10-CM

## 2017-08-22 DIAGNOSIS — Z21 Asymptomatic human immunodeficiency virus [HIV] infection status: Secondary | ICD-10-CM | POA: Diagnosis present

## 2017-08-22 DIAGNOSIS — E785 Hyperlipidemia, unspecified: Secondary | ICD-10-CM | POA: Diagnosis present

## 2017-08-22 DIAGNOSIS — F1010 Alcohol abuse, uncomplicated: Secondary | ICD-10-CM | POA: Diagnosis present

## 2017-08-22 DIAGNOSIS — Z79899 Other long term (current) drug therapy: Secondary | ICD-10-CM

## 2017-08-22 DIAGNOSIS — I6782 Cerebral ischemia: Secondary | ICD-10-CM | POA: Diagnosis not present

## 2017-08-22 DIAGNOSIS — I679 Cerebrovascular disease, unspecified: Secondary | ICD-10-CM | POA: Diagnosis present

## 2017-08-22 LAB — CBC WITH DIFFERENTIAL/PLATELET
Basophils Absolute: 0 10*3/uL (ref 0.0–0.1)
Basophils Relative: 0 %
Eosinophils Absolute: 0.2 10*3/uL (ref 0.0–0.7)
Eosinophils Relative: 5 %
HCT: 34 % — ABNORMAL LOW (ref 39.0–52.0)
Hemoglobin: 11.3 g/dL — ABNORMAL LOW (ref 13.0–17.0)
Lymphocytes Relative: 33 %
Lymphs Abs: 1.2 10*3/uL (ref 0.7–4.0)
MCH: 28.9 pg (ref 26.0–34.0)
MCHC: 33.2 g/dL (ref 30.0–36.0)
MCV: 87 fL (ref 78.0–100.0)
Monocytes Absolute: 0.2 10*3/uL (ref 0.1–1.0)
Monocytes Relative: 4 %
Neutro Abs: 2.1 10*3/uL (ref 1.7–7.7)
Neutrophils Relative %: 58 %
Platelets: 120 10*3/uL — ABNORMAL LOW (ref 150–400)
RBC: 3.91 MIL/uL — ABNORMAL LOW (ref 4.22–5.81)
RDW: 14.7 % (ref 11.5–15.5)
WBC: 3.7 10*3/uL — ABNORMAL LOW (ref 4.0–10.5)

## 2017-08-22 LAB — COMPREHENSIVE METABOLIC PANEL
ALT: 15 U/L — ABNORMAL LOW (ref 17–63)
AST: 22 U/L (ref 15–41)
Albumin: 3 g/dL — ABNORMAL LOW (ref 3.5–5.0)
Alkaline Phosphatase: 102 U/L (ref 38–126)
Anion gap: 5 (ref 5–15)
BUN: 22 mg/dL — ABNORMAL HIGH (ref 6–20)
CO2: 28 mmol/L (ref 22–32)
Calcium: 8.6 mg/dL — ABNORMAL LOW (ref 8.9–10.3)
Chloride: 107 mmol/L (ref 101–111)
Creatinine, Ser: 2.06 mg/dL — ABNORMAL HIGH (ref 0.61–1.24)
GFR calc Af Amer: 39 mL/min — ABNORMAL LOW (ref >60)
GFR calc non Af Amer: 34 mL/min — ABNORMAL LOW (ref >60)
Glucose, Bld: 71 mg/dL (ref 65–99)
Potassium: 4 mmol/L (ref 3.5–5.1)
Sodium: 140 mmol/L (ref 135–145)
Total Bilirubin: 0.6 mg/dL (ref 0.3–1.2)
Total Protein: 7.6 g/dL (ref 6.5–8.1)

## 2017-08-22 LAB — URINALYSIS, ROUTINE W REFLEX MICROSCOPIC
Bilirubin Urine: NEGATIVE
Glucose, UA: NEGATIVE mg/dL
Ketones, ur: NEGATIVE mg/dL
Leukocytes, UA: NEGATIVE
Nitrite: NEGATIVE
Protein, ur: >=300 mg/dL — AB
Specific Gravity, Urine: 1.024 (ref 1.005–1.030)
pH: 5 (ref 5.0–8.0)

## 2017-08-22 LAB — RAPID URINE DRUG SCREEN, HOSP PERFORMED
Amphetamines: NOT DETECTED
Barbiturates: NOT DETECTED
Benzodiazepines: NOT DETECTED
Cocaine: POSITIVE — AB
Opiates: NOT DETECTED
Tetrahydrocannabinol: POSITIVE — AB

## 2017-08-22 LAB — ETHANOL: Alcohol, Ethyl (B): <10 mg/dL (ref <10)

## 2017-08-22 MED ORDER — HYDRALAZINE HCL 20 MG/ML IJ SOLN
10.0000 mg | Freq: Once | INTRAMUSCULAR | Status: AC
  Administered 2017-08-22: 10 mg via INTRAVENOUS
  Filled 2017-08-22: qty 1

## 2017-08-22 MED ORDER — LORAZEPAM 2 MG/ML IJ SOLN
1.0000 mg | Freq: Once | INTRAMUSCULAR | Status: AC
  Administered 2017-08-22: 1 mg via INTRAVENOUS
  Filled 2017-08-22: qty 1

## 2017-08-22 NOTE — ED Provider Notes (Addendum)
[facility] EMERGENCY DEPARTMENT Provider Note   CSN: [id number] Arrival date & time: 08/22/17  1925     History   Chief Complaint Chief Complaint  Patient presents with  . right arm twitching    HPI Peter Cox is a 57 y.o. male.  Patient presents with complaint of right upper extremity twitching since yesterday. He denies pain. No swelling or injury. No other symptoms, specifically, no headache, numbness, weakness. He has a significant history of HTN, HIV, CVA, medication noncompliance, and cocaine dependence. He denies current drug use. He admits to not taking any of his medications in several months. No CP, nausea, vomiting, SOB, fever.  The history is provided by the patient.    Past Medical History:  Diagnosis Date  . Alcohol abuse    heavy, clean since April  . Alcohol abuse 11/24/2014  . Cocaine abuse ([place])     clean since about 2000  . Drug abuse ([place]) 11/24/2014  . HIV infection ([place])    dx 08/08/2008 (unknown how he was exposed)  Initial VL 43,200 and CD4 410 on 08/28/08  . Hyperlipidemia   . Hypertension   . Marijuana abuse   . Mild cognitive impairment    likely vascular dementia/psa/?HIV componentCT Head 02/12/08 for htn crisis and concern for stroke: impression-1. Old right internal capsule and thalamic lacunar infarcts. 2. Mild to moderate chronic small vessell white matter ischemic changes in both cerebral hemisphere, greater on the right  . Neuromuscular disorder ([place])   . Seizures ([place])    "last year had seizure"  . Stroke ([place])   . Tobacco abuse     Patient Active Problem List   Diagnosis Date Noted  . Drug abuse ([place]) 11/24/2014  . Alcohol abuse 11/24/2014  . HIV dementia ([place]) 07/02/2014  . 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 CVA (cerebral infarction) 07/02/2014  . Depression 04/30/2014  . Lipoma 03/27/2013  . Marijuana intoxication ([place]) 03/27/2013  . Preventative health care  03/13/2013  . Smoking 03/13/2013  . Poor housing 03/13/2013  . History of CVA 03/12/2013  . CKD stage 2 11/20/2008  . Human immunodeficiency virus (HIV) disease ([place]) 08/17/2008  . SUBSTANCE ABUSE, MULTIPLE 08/08/2008  . Dementia, multiinfarct 08/08/2008  . DYSLIPIDEMIA 06/26/2008  . Essential hypertension 06/24/2008  . Left shoulder pain 06/24/2008    Past Surgical History:  Procedure Laterality Date  . CLOSED REDUCTION PATELLAR     right knee  . HEMORRHOID SURGERY    . SHOULDER ARTHROSCOPY  01/2008   w/extensive debridement (Dr Mardelle Matte)       Home Medications    Prior to Admission medications   Medication Sig Start Date End Date Taking? Authorizing Provider  amLODipine (NORVASC) 10 MG tablet Take 1 tablet (10 mg total) by mouth daily. 09/10/15   Truman Hayward, MD  aspirin 81 MG tablet Take 1 tablet (81 mg total) by mouth daily. 11/23/12   Dorothy Spark, MD  darunavir-cobicistat (PREZCOBIX) 800-150 MG tablet Take 1 tablet by mouth daily. 09/10/15   Truman Hayward, MD  emtricitabine-tenofovir AF (DESCOVY) 200-25 MG tablet Take 1 tablet by mouth daily. 09/10/15   Truman Hayward, MD  escitalopram (LEXAPRO) 10 MG tablet Take 1 tablet (10 mg total) by mouth daily. 03/24/14   Truman Hayward, MD  lisinopril (PRINIVIL,ZESTRIL) 20 MG tablet TAKE 1 TABLET (20 MG TOTAL) BY MOUTH DAILY. 11/30/15   Truman Hayward, MD  pravastatin (PRAVACHOL) 40 MG tablet Take 1 tablet (40 mg total) by mouth at bedtime. 09/10/15   Truman Hayward, MD  triamcinolone ointment (KENALOG) 0.5 % Apply topically 2 (two) times daily. 01/23/13   Truman Hayward, MD    Family History Family History  Problem Relation Age of Onset  . Diabetes Mother   . Hypertension Mother   . Stroke Mother   . Diabetes Father   . Hypertension Sister   . Hypertension Sister   . Colon cancer Neg Hx     Social History Social History   Tobacco Use  . Smoking status: Current Every Day  Smoker    Packs/day: 0.50    Years: 40.00    Pack years: 20.00    Types: Cigarettes  . Smokeless tobacco: Never Used  . Tobacco comment: Quit  x 2 week.  Substance Use Topics  . Alcohol use: Yes    Alcohol/week: 0.0 oz    Comment: drink beer  . Drug use: Yes    Frequency: 7.0 times per week    Types: Marijuana    Comment: use Marijuana every day per pt.     Allergies   Patient has no known allergies.   Review of Systems Review of Systems  Constitutional: Negative for chills and fever.  HENT: Negative.   Respiratory: Negative.  Negative for cough and shortness of breath.   Cardiovascular: Negative.   Gastrointestinal: Negative.  Negative for nausea and vomiting.  Musculoskeletal: Negative.        See HPI.  Skin: Negative.  Negative for color change.  Neurological: Negative.  Negative for weakness, numbness and headaches.     Physical Exam Updated Vital Signs BP (!) 234/132   Pulse 77   Temp (!) 97.4 F (36.3 C) (Oral)   Resp 18   SpO2 99%   Physical Exam  Constitutional: He appears well-developed and well-nourished.  HENT:  Head: Normocephalic.  Neck: Normal range of motion. Neck supple.  Cardiovascular: Normal rate and regular rhythm.  Pulmonary/Chest: Effort normal and breath sounds normal. He has no wheezes. He has no rales.  Abdominal: Soft. Bowel sounds are normal. There is no tenderness. There is no rebound and no guarding.  Musculoskeletal: Normal range of motion.  Neurological: He is alert.  No UE tremor or fasciculations. Equal and symmetric UE strength. No sensory deficit. Preserved fine motor control.  Skin: Skin is warm and dry. No rash noted.  Psychiatric: He has a normal mood and affect.     ED Treatments / Results  Labs (all labs ordered are listed, but only abnormal results are displayed) Labs Reviewed  CBC WITH DIFFERENTIAL/PLATELET - Abnormal; Notable for the following components:      Result Value   WBC 3.7 (*)    RBC 3.91 (*)     Hemoglobin 11.3 (*)    HCT 34.0 (*)    Platelets 120 (*)    All other components within normal limits  COMPREHENSIVE METABOLIC PANEL - Abnormal; Notable for the following components:   BUN 22 (*)    Creatinine, Ser 2.06 (*)    Calcium 8.6 (*)    Albumin 3.0 (*)    ALT 15 (*)    GFR calc non Af Amer 34 (*)    GFR calc Af Amer 39 (*)    All other components within normal limits  URINALYSIS, ROUTINE W REFLEX MICROSCOPIC - Abnormal; Notable for the following components:   APPearance HAZY (*)  Hgb urine dipstick SMALL (*)    Protein, ur >=300 (*)    Bacteria, UA RARE (*)    Squamous Epithelial / LPF 0-5 (*)    All other components within normal limits  RAPID URINE DRUG SCREEN, HOSP PERFORMED - Abnormal; Notable for the following components:   Cocaine POSITIVE (*)    Tetrahydrocannabinol POSITIVE (*)    All other components within normal limits  ETHANOL    EKG  EKG Interpretation  Date/Time:  Tuesday August 22 2017 20:36:26 EST Ventricular Rate:  69 PR Interval:  150 QRS Duration: 80 QT Interval:  422 QTC Calculation: 452 R Axis:   85 Text Interpretation:  Normal sinus rhythm Left ventricular hypertrophy Cannot rule out Septal infarct , age undetermined Abnormal ECG Confirmed by Thayer Jew [phone]) on 08/22/2017 11:09:37 PM       Radiology Ct Head Wo Contrast  Result Date: 08/22/2017 CLINICAL DATA:  Involuntary twitching of the right arm. EXAM: CT HEAD WITHOUT CONTRAST TECHNIQUE: Contiguous axial images were obtained from the base of the skull through the vertex without intravenous contrast. COMPARISON:  None. FINDINGS: Brain: Chronic right basal ganglial and bilateral anterior limb of internal capsule lacunar infarcts with chronic mild to moderate small vessel ischemic disease of periventricular white matter. No acute intracranial hemorrhage, midline shift or edema. No hydrocephalus. No intra-axial mass nor extra-axial fluid collections. Midline fourth ventricle and basal  cisterns. Vascular: No hyperdense vessel sign.  No unexpected calcifications. Skull: No acute osseous abnormality. Sinuses/Orbits: Clear mastoids and paranasal sinuses. Intact orbits. Other: None. IMPRESSION: 1. Chronic microvascular ischemic disease. 2. Chronic right basal ganglial and bilateral internal capsular lacunar infarcts. 3. No acute intracranial abnormality. Electronically Signed   By: Ashley Royalty M.D.   On: 08/22/2017 21:20    Procedures Procedures (including critical care time) CRITICAL CARE Performed by: Dewaine Oats   Total critical care time: 25 minutes  Critical care time was exclusive of separately billable procedures and treating other patients.  Critical care was necessary to treat or prevent imminent or life-threatening deterioration.  Critical care was time spent personally by me on the following activities: development of treatment plan with patient and/or surrogate as well as nursing, discussions with consultants, evaluation of patient's response to treatment, examination of patient, obtaining history from patient or surrogate, ordering and performing treatments and interventions, ordering and review of laboratory studies, ordering and review of radiographic studies, pulse oximetry and re-evaluation of patient's condition.  Medications Ordered in ED Medications  LORazepam (ATIVAN) injection 1 mg (not administered)  hydrALAZINE (APRESOLINE) injection 10 mg (not administered)     Initial Impression / Assessment and Plan / ED Course  I have reviewed the triage vital signs and the nursing notes.  Pertinent labs & imaging results that were available during my care of the patient were reviewed by me and considered in my medical decision making (see chart for details).     The patient presents with 'twitching' of right UE since yesterday. Symptoms are improving, resolved. During symptomatic period, he reports he could voluntarily control the movement, had no pain or  numbness.   Head CT negative for acute finding. His blood pressure is significantly elevated. He specifically denies chest pain, dizziness, nausea, SOB. EKG without change from previous study. Renal function is abnormal with Cr 2.06.   Ativan provided as well as Hydralazine. There is some improvement in BP but still significantly elevated, last check 210/103.   He has history of stroke, medication noncomplicance, is cocaine +  on UDS and has AKI suggesting end organ involvement. Plan to admit the patient for hypertensive urgency. Hospitalist paged for admission.  Final Clinical Impressions(s) / ED Diagnoses   Final diagnoses:  None   1. Hypertensive urgency 2. AKI 3. Medication noncompliance 4. Cocaine dependence  ED Discharge Orders    None       Dennie Bible 08/23/17 0202    Merryl Hacker, MD 08/23/17 0630    Charlann Lange, PA-C 10/02/17 2250    Dina Rich Barbette Hair, MD 10/03/17 [phone]

## 2017-08-22 NOTE — ED Provider Notes (Signed)
Patient placed in Quick Look pathway, seen and evaluated   Chief Complaint: Right arm twitching  HPI: Patient states he has had constant movement in the right arm since yesterday.  He states he is able to use his arm and able to stop the movement and twitching with effort.  He states his daughter is the one that noticed that it made him come to emergency department.  Patient denies history of the same.  He denies any head trauma.  No history of seizures.   denies any drugs or medications. Nothing making his symptoms better or worse. No headache.   ROS: right arm twitching. Negative for headache, weakness, pain  Physical Exam:   Gen: No distress  Neuro: Awake and Alert  Skin: Warm    Focused Exam: AAOx3. PERRLA, cranial nerves intact. Right arm with figiting movements.  5/5 and equal stregth of deltoid, bicep, tricep, grip. Coordination, finger to nose intact. Sensation intact in upper extremity. Pt is able to physically stop the twitching with effort. No cogwheel rigidity.  Pt with right arm involuntary movement. Strength and movement intact. Vascularly intact. Pt is very anxious and figiting in the chair, unable to stay still. BP elevated, no hx of the same. Will check CT head, get labs, get drug screen.    Vitals:   08/22/17 2015 08/22/17 2029  BP:  (!) 190/128  Pulse: 77   Resp: 18   Temp: (!) 97.4 F (36.3 C)   TempSrc: Oral   SpO2: 99%      Initiation of care has begun. The patient has been counseled on the process, plan, and necessity for staying for the completion/evaluation, and the remainder of the medical screening examination    Jeannett Senior, Hershal Coria 08/22/17 2033    Tegeler, Gwenyth Allegra, MD 08/23/17 [phone]

## 2017-08-22 NOTE — ED Triage Notes (Signed)
Pt states he is been having involuntary movement on his right arm pt states is like twitching. Denies any pain or discomfort.

## 2017-08-22 NOTE — ED Notes (Signed)
Pt alert and oriented x4. Skin warm and dry. Pupils equal and reactive to light. Pt ambulatory with steady gait. Respirations equal and unlabored. s1 and s2 heart sounds audible. Abdomen soft and non distended. Pt denies head, pain, or weakness.  Pt states has right arm twitching, uncontrolled hypertension, is not compliant with med. Provider aware of BP.

## 2017-08-23 DIAGNOSIS — I129 Hypertensive chronic kidney disease with stage 1 through stage 4 chronic kidney disease, or unspecified chronic kidney disease: Secondary | ICD-10-CM | POA: Diagnosis present

## 2017-08-23 DIAGNOSIS — F329 Major depressive disorder, single episode, unspecified: Secondary | ICD-10-CM | POA: Diagnosis present

## 2017-08-23 DIAGNOSIS — F0281 Dementia in other diseases classified elsewhere with behavioral disturbance: Secondary | ICD-10-CM

## 2017-08-23 DIAGNOSIS — F1910 Other psychoactive substance abuse, uncomplicated: Secondary | ICD-10-CM | POA: Diagnosis present

## 2017-08-23 DIAGNOSIS — F1420 Cocaine dependence, uncomplicated: Secondary | ICD-10-CM | POA: Diagnosis present

## 2017-08-23 DIAGNOSIS — F17210 Nicotine dependence, cigarettes, uncomplicated: Secondary | ICD-10-CM | POA: Diagnosis present

## 2017-08-23 DIAGNOSIS — F0280 Dementia in other diseases classified elsewhere without behavioral disturbance: Secondary | ICD-10-CM | POA: Diagnosis present

## 2017-08-23 DIAGNOSIS — Z21 Asymptomatic human immunodeficiency virus [HIV] infection status: Secondary | ICD-10-CM | POA: Diagnosis present

## 2017-08-23 DIAGNOSIS — Z79899 Other long term (current) drug therapy: Secondary | ICD-10-CM | POA: Diagnosis not present

## 2017-08-23 DIAGNOSIS — R253 Fasciculation: Secondary | ICD-10-CM | POA: Diagnosis present

## 2017-08-23 DIAGNOSIS — G465 Pure motor lacunar syndrome: Secondary | ICD-10-CM | POA: Diagnosis present

## 2017-08-23 DIAGNOSIS — Z9114 Patient's other noncompliance with medication regimen: Secondary | ICD-10-CM | POA: Diagnosis not present

## 2017-08-23 DIAGNOSIS — N183 Chronic kidney disease, stage 3 (moderate): Secondary | ICD-10-CM | POA: Diagnosis present

## 2017-08-23 DIAGNOSIS — F0150 Vascular dementia without behavioral disturbance: Secondary | ICD-10-CM

## 2017-08-23 DIAGNOSIS — E785 Hyperlipidemia, unspecified: Secondary | ICD-10-CM | POA: Diagnosis present

## 2017-08-23 DIAGNOSIS — N189 Chronic kidney disease, unspecified: Secondary | ICD-10-CM

## 2017-08-23 DIAGNOSIS — F1010 Alcohol abuse, uncomplicated: Secondary | ICD-10-CM | POA: Diagnosis present

## 2017-08-23 DIAGNOSIS — F14288 Cocaine dependence with other cocaine-induced disorder: Secondary | ICD-10-CM

## 2017-08-23 DIAGNOSIS — I161 Hypertensive emergency: Secondary | ICD-10-CM | POA: Diagnosis present

## 2017-08-23 DIAGNOSIS — Z7982 Long term (current) use of aspirin: Secondary | ICD-10-CM | POA: Diagnosis not present

## 2017-08-23 DIAGNOSIS — N179 Acute kidney failure, unspecified: Secondary | ICD-10-CM | POA: Diagnosis present

## 2017-08-23 DIAGNOSIS — I160 Hypertensive urgency: Secondary | ICD-10-CM

## 2017-08-23 DIAGNOSIS — B20 Human immunodeficiency virus [HIV] disease: Secondary | ICD-10-CM

## 2017-08-23 DIAGNOSIS — I679 Cerebrovascular disease, unspecified: Secondary | ICD-10-CM | POA: Diagnosis present

## 2017-08-23 DIAGNOSIS — F1210 Cannabis abuse, uncomplicated: Secondary | ICD-10-CM | POA: Diagnosis present

## 2017-08-23 DIAGNOSIS — F419 Anxiety disorder, unspecified: Secondary | ICD-10-CM | POA: Diagnosis present

## 2017-08-23 DIAGNOSIS — F1410 Cocaine abuse, uncomplicated: Secondary | ICD-10-CM | POA: Diagnosis present

## 2017-08-23 DIAGNOSIS — D631 Anemia in chronic kidney disease: Secondary | ICD-10-CM | POA: Diagnosis present

## 2017-08-23 DIAGNOSIS — I69311 Memory deficit following cerebral infarction: Secondary | ICD-10-CM | POA: Diagnosis not present

## 2017-08-23 DIAGNOSIS — G709 Myoneural disorder, unspecified: Secondary | ICD-10-CM | POA: Diagnosis present

## 2017-08-23 LAB — BASIC METABOLIC PANEL
Anion gap: 7 (ref 5–15)
BUN: 21 mg/dL — ABNORMAL HIGH (ref 6–20)
CO2: 26 mmol/L (ref 22–32)
Calcium: 8.6 mg/dL — ABNORMAL LOW (ref 8.9–10.3)
Chloride: 107 mmol/L (ref 101–111)
Creatinine, Ser: 2.02 mg/dL — ABNORMAL HIGH (ref 0.61–1.24)
GFR calc Af Amer: 40 mL/min — ABNORMAL LOW (ref >60)
GFR calc non Af Amer: 35 mL/min — ABNORMAL LOW (ref >60)
Glucose, Bld: 86 mg/dL (ref 65–99)
Potassium: 3.7 mmol/L (ref 3.5–5.1)
Sodium: 140 mmol/L (ref 135–145)

## 2017-08-23 LAB — T-HELPER CELLS (CD4) COUNT (NOT AT ARMC)
CD4 % Helper T Cell: 28 % — ABNORMAL LOW (ref 33–55)
CD4 T Cell Abs: 340 /uL — ABNORMAL LOW (ref 400–2700)

## 2017-08-23 LAB — MRSA PCR SCREENING: MRSA by PCR: NEGATIVE

## 2017-08-23 MED ORDER — FOLIC ACID 1 MG PO TABS
1.0000 mg | ORAL_TABLET | Freq: Every day | ORAL | Status: DC
  Administered 2017-08-23 – 2017-08-25 (×3): 1 mg via ORAL
  Filled 2017-08-23 (×3): qty 1

## 2017-08-23 MED ORDER — THIAMINE HCL 100 MG/ML IJ SOLN: 100.0000 mg | Freq: Every day | INTRAMUSCULAR | Status: DC

## 2017-08-23 MED ORDER — ONDANSETRON HCL 4 MG PO TABS: 4.0000 mg | ORAL_TABLET | Freq: Four times a day (QID) | ORAL | Status: DC | PRN

## 2017-08-23 MED ORDER — ONDANSETRON HCL 4 MG/2ML IJ SOLN: 4.0000 mg | Freq: Four times a day (QID) | INTRAMUSCULAR | Status: DC | PRN

## 2017-08-23 MED ORDER — LORAZEPAM 1 MG PO TABS: 1.0000 mg | ORAL_TABLET | Freq: Four times a day (QID) | ORAL | Status: DC | PRN

## 2017-08-23 MED ORDER — HYDRALAZINE HCL 20 MG/ML IJ SOLN
10.0000 mg | Freq: Once | INTRAMUSCULAR | Status: AC
  Administered 2017-08-23: 10 mg via INTRAVENOUS
  Filled 2017-08-23: qty 1

## 2017-08-23 MED ORDER — ACETAMINOPHEN 650 MG RE SUPP: 650.0000 mg | Freq: Four times a day (QID) | RECTAL | Status: DC | PRN

## 2017-08-23 MED ORDER — ADULT MULTIVITAMIN W/MINERALS CH
1.0000 | ORAL_TABLET | Freq: Every day | ORAL | Status: DC
  Administered 2017-08-23 – 2017-08-25 (×3): 1 via ORAL
  Filled 2017-08-23 (×3): qty 1

## 2017-08-23 MED ORDER — HEPARIN SODIUM (PORCINE) 5000 UNIT/ML IJ SOLN
5000.0000 [IU] | Freq: Three times a day (TID) | INTRAMUSCULAR | Status: DC
  Administered 2017-08-23 – 2017-08-25 (×6): 5000 [IU] via SUBCUTANEOUS
  Filled 2017-08-23 (×5): qty 1

## 2017-08-23 MED ORDER — VITAMIN B-1 100 MG PO TABS
100.0000 mg | ORAL_TABLET | Freq: Every day | ORAL | Status: DC
Start: 2017-08-23 — End: 2017-08-25
  Administered 2017-08-23 – 2017-08-25 (×3): 100 mg via ORAL
  Filled 2017-08-23 (×3): qty 1

## 2017-08-23 MED ORDER — AMLODIPINE BESYLATE 10 MG PO TABS
10.0000 mg | ORAL_TABLET | Freq: Every day | ORAL | Status: DC
  Administered 2017-08-24 – 2017-08-25 (×2): 10 mg via ORAL
  Filled 2017-08-23 (×2): qty 1

## 2017-08-23 MED ORDER — ACETAMINOPHEN 325 MG PO TABS: 650.0000 mg | ORAL_TABLET | Freq: Four times a day (QID) | ORAL | Status: DC | PRN

## 2017-08-23 MED ORDER — AMLODIPINE BESYLATE 5 MG PO TABS
10.0000 mg | ORAL_TABLET | Freq: Once | ORAL | Status: AC
Start: 2017-08-23 — End: 2017-08-23
  Administered 2017-08-23: 10 mg via ORAL
  Filled 2017-08-23: qty 2

## 2017-08-23 MED ORDER — CLONIDINE HCL 0.2 MG PO TABS
0.2000 mg | ORAL_TABLET | Freq: Three times a day (TID) | ORAL | Status: DC
  Administered 2017-08-23 – 2017-08-25 (×8): 0.2 mg via ORAL
  Filled 2017-08-23 (×8): qty 1

## 2017-08-23 MED ORDER — HYDRALAZINE HCL 20 MG/ML IJ SOLN
20.0000 mg | INTRAMUSCULAR | Status: DC | PRN
  Administered 2017-08-23: 20 mg via INTRAVENOUS
  Filled 2017-08-23: qty 1

## 2017-08-23 MED ORDER — LABETALOL HCL 5 MG/ML IV SOLN: 10.0000 mg | Freq: Once | INTRAVENOUS | Status: DC

## 2017-08-23 MED ORDER — LORAZEPAM 2 MG/ML IJ SOLN
1.0000 mg | Freq: Four times a day (QID) | INTRAMUSCULAR | Status: DC | PRN
  Administered 2017-08-23: 1 mg via INTRAVENOUS
  Filled 2017-08-23: qty 1

## 2017-08-23 NOTE — ED Notes (Signed)
Dr. Alcario Drought called. Made aware of patient's BP. Stated will order 10 mg of hydralazine

## 2017-08-23 NOTE — ED Notes (Signed)
Provider paged threw amnion, BP remains elevated

## 2017-08-23 NOTE — Progress Notes (Signed)
The patient is a 57 year old male with past medical history significant for HIV polysubstance abuse including alcohol, marijuana, and cocaine abuse from home who presents to the ED with complaints of right arm twitching which could voluntarily control.  Upon presentation patient was found to be in hypertensive emergency with blood pressure in the 230s with AK I on CKD 3.  08/23/2017 patient was seen and examined in the emergency room.  He is somnolent and minimally interactive.  States he had without contrast revealed chronic microvascular ischemic disease chronic right basal ganglia and bilateral internal capsular lacunar infarcts with no acute intracranial abnormality.  Patient is admitted for hypertensive emergency with a chaotic in the setting of suspected medication noncompliance and cocaine abuse.  UDS positive for cocaine and THC (08/22/17).    Please refer to H&P dictated by Dr. Alcario Drought on 08/23/2017 for further details on assessment and plan.

## 2017-08-23 NOTE — H&P (Signed)
History and Physical    Peter Cox TSV:[id number] DOB: 12/14/1960 DOA: 08/22/2017  PCP: Patient, No Pcp Per  Patient coming from: Home  I have personally briefly reviewed patient's old medical records in [place]  Chief Complaint: R arm twitching  HPI: Peter Cox is a 57 y.o. male with medical history significant of HIV, EtOH and cocaine abuse, HTN, non-compliance with meds.  Patient presents to the ED with c/o R arm twitching.  Symptoms are improving, resolved. During symptomatic period, he reports he could voluntarily control the movement, had no pain or numbness.    ED Course: Concerningly his creat is 2.0 up from 1.6 in March last year, his BPs are running 923R systolic, and still 007 systolic after 10 PO amlodipine and 10 IV hydralazine.  UDS positive for cocaine and THC.   Review of Systems: As per HPI otherwise 10 point review of systems negative.   Past Medical History:  Diagnosis Date  . Alcohol abuse    heavy, clean since April  . Alcohol abuse 11/24/2014  . Cocaine abuse ([place])     clean since about 2000  . Drug abuse ([place]) 11/24/2014  . HIV infection ([place])    dx 08/08/2008 (unknown how he was exposed)  Initial VL 43,200 and CD4 410 on 08/28/08  . Hyperlipidemia   . Hypertension   . Marijuana abuse   . Mild cognitive impairment    likely vascular dementia/psa/?HIV componentCT Head 02/12/08 for htn crisis and concern for stroke: impression-1. Old right internal capsule and thalamic lacunar infarcts. 2. Mild to moderate chronic small vessell white matter ischemic changes in both cerebral hemisphere, greater on the right  . Neuromuscular disorder ([place])   . Seizures ([place])    "last year had seizure"  . Stroke ([place])   . Tobacco abuse     Past Surgical History:  Procedure Laterality Date  . CLOSED REDUCTION PATELLAR     right knee  . HEMORRHOID SURGERY    . SHOULDER ARTHROSCOPY  01/2008   w/extensive debridement (Dr Mardelle Matte)     reports that he has been  smoking cigarettes.  He has a 20.00 pack-year smoking history. he has never used smokeless tobacco. He reports that he drinks alcohol. He reports that he uses drugs. Drug: Marijuana. Frequency: 7.00 times per week.  No Known Allergies  Family History  Problem Relation Age of Onset  . Diabetes Mother   . Hypertension Mother   . Stroke Mother   . Diabetes Father   . Hypertension Sister   . Hypertension Sister   . Colon cancer Neg Hx      Prior to Admission medications   Medication Sig Start Date End Date Taking? Authorizing Provider  amLODipine (NORVASC) 10 MG tablet Take 1 tablet (10 mg total) by mouth daily. 09/10/15   Truman Hayward, MD  aspirin 81 MG tablet Take 1 tablet (81 mg total) by mouth daily. 11/23/12   Dorothy Spark, MD  darunavir-cobicistat (PREZCOBIX) 800-150 MG tablet Take 1 tablet by mouth daily. 09/10/15   Truman Hayward, MD  emtricitabine-tenofovir AF (DESCOVY) 200-25 MG tablet Take 1 tablet by mouth daily. 09/10/15   Truman Hayward, MD  escitalopram (LEXAPRO) 10 MG tablet Take 1 tablet (10 mg total) by mouth daily. 03/24/14   Truman Hayward, MD  lisinopril (PRINIVIL,ZESTRIL) 20 MG tablet TAKE 1 TABLET (20 MG TOTAL) BY MOUTH DAILY. 11/30/15   Truman Hayward, MD  pravastatin (  PRAVACHOL) 40 MG tablet Take 1 tablet (40 mg total) by mouth at bedtime. 09/10/15   Truman Hayward, MD    Physical Exam: Vitals:   08/22/17 2350 08/22/17 2355 08/23/17 0000 08/23/17 0045  BP: (!) 197/107 (!) 199/107 (!) 210/103 (!) 203/128  Pulse:      Resp:      Temp:      TempSrc:      SpO2:        Constitutional: NAD, calm, comfortable Eyes: PERRL, lids and conjunctivae normal ENMT: Mucous membranes are moist. Posterior pharynx clear of any exudate or lesions.Normal dentition.  Neck: normal, supple, no masses, no thyromegaly Respiratory: clear to auscultation bilaterally, no wheezing, no crackles. Normal respiratory effort. No accessory muscle use.    Cardiovascular: Regular rate and rhythm, no murmurs / rubs / gallops. No extremity edema. 2+ pedal pulses. No carotid bruits.  Abdomen: no tenderness, no masses palpated. No hepatosplenomegaly. Bowel sounds positive.  Musculoskeletal: no clubbing / cyanosis. No joint deformity upper and lower extremities. Good ROM, no contractures. Normal muscle tone.  Skin: no rashes, lesions, ulcers. No induration Neurologic: CN 2-12 grossly intact. Sensation intact, DTR normal. Strength 5/5 in all 4.  Psychiatric: Normal judgment and insight. Alert and oriented x 3. Normal mood.    Labs on Admission: I have personally reviewed following labs and imaging studies  CBC: Recent Labs  Lab 08/22/17 2030  WBC 3.7*  NEUTROABS 2.1  HGB 11.3*  HCT 34.0*  MCV 87.0  PLT 585*   Basic Metabolic Panel: Recent Labs  Lab 08/22/17 2030  NA 140  K 4.0  CL 107  CO2 28  GLUCOSE 71  BUN 22*  CREATININE 2.06*  CALCIUM 8.6*   GFR: CrCl cannot be calculated (Unknown ideal weight.). Liver Function Tests: Recent Labs  Lab 08/22/17 2030  AST 22  ALT 15*  ALKPHOS 102  BILITOT 0.6  PROT 7.6  ALBUMIN 3.0*   No results for input(s): LIPASE, AMYLASE in the last 168 hours. No results for input(s): AMMONIA in the last 168 hours. Coagulation Profile: No results for input(s): INR, PROTIME in the last 168 hours. Cardiac Enzymes: No results for input(s): CKTOTAL, CKMB, CKMBINDEX, TROPONINI in the last 168 hours. BNP (last 3 results) No results for input(s): PROBNP in the last 8760 hours. HbA1C: No results for input(s): HGBA1C in the last 72 hours. CBG: No results for input(s): GLUCAP in the last 168 hours. Lipid Profile: No results for input(s): CHOL, HDL, LDLCALC, TRIG, CHOLHDL, LDLDIRECT in the last 72 hours. Thyroid Function Tests: No results for input(s): TSH, T4TOTAL, FREET4, T3FREE, THYROIDAB in the last 72 hours. Anemia Panel: No results for input(s): VITAMINB12, FOLATE, FERRITIN, TIBC, IRON,  RETICCTPCT in the last 72 hours. Urine analysis:    Component Value Date/Time   COLORURINE YELLOW 08/22/2017 2023   APPEARANCEUR HAZY (A) 08/22/2017 2023   LABSPEC 1.024 08/22/2017 2023   PHURINE 5.0 08/22/2017 2023   GLUCOSEU NEGATIVE 08/22/2017 2023   GLUCOSEU NEG mg/dL 08/28/2008 2107   HGBUR SMALL (A) 08/22/2017 2023   HGBUR large 06/24/2008 0948   BILIRUBINUR NEGATIVE 08/22/2017 2023   KETONESUR NEGATIVE 08/22/2017 2023   PROTEINUR >=300 (A) 08/22/2017 2023   UROBILINOGEN 0.2 12/07/2012 1042   NITRITE NEGATIVE 08/22/2017 2023   LEUKOCYTESUR NEGATIVE 08/22/2017 2023    Radiological Exams on Admission: Ct Head Wo Contrast  Result Date: 08/22/2017 CLINICAL DATA:  Involuntary twitching of the right arm. EXAM: CT HEAD WITHOUT CONTRAST TECHNIQUE: Contiguous axial images  were obtained from the base of the skull through the vertex without intravenous contrast. COMPARISON:  None. FINDINGS: Brain: Chronic right basal ganglial and bilateral anterior limb of internal capsule lacunar infarcts with chronic mild to moderate small vessel ischemic disease of periventricular white matter. No acute intracranial hemorrhage, midline shift or edema. No hydrocephalus. No intra-axial mass nor extra-axial fluid collections. Midline fourth ventricle and basal cisterns. Vascular: No hyperdense vessel sign.  No unexpected calcifications. Skull: No acute osseous abnormality. Sinuses/Orbits: Clear mastoids and paranasal sinuses. Intact orbits. Other: None. IMPRESSION: 1. Chronic microvascular ischemic disease. 2. Chronic right basal ganglial and bilateral internal capsular lacunar infarcts. 3. No acute intracranial abnormality. Electronically Signed   By: Ashley Royalty M.D.   On: 08/22/2017 21:20    EKG: Independently reviewed.  Assessment/Plan Principal Problem:   Hypertensive urgency Active Problems:   Human immunodeficiency virus (HIV) disease ([facility])   Dementia, multiinfarct   Dementia due to HIV infection  with behavioral disturbance ([facility])   Cocaine abuse ([place])   Acute kidney injury superimposed on CKD ([place])    1. HTN urgency - causing AKI on CKD stage 3 (vs progression of CKD) 1. Suspect cocaine intoxication at fault 2. Hydralazine PRN 3. Continue daily amlodipine 2. HIV - 1. Sounds like he hasnt been taking meds or following with ID recently from ID note in Aug last year 2. Will get CD4 and viral load 3. Med rec pending 3. Dementia - chronic and stable 4. EtOH abuse history - CIWA  DVT prophylaxis: Lovenox Code Status: Full Family Communication: No family in room Disposition Plan: Home after admit Consults called: None Admission status: Place in [place], [place] Hospitalists Pager [phone]  If 7AM-7PM, please contact day team taking care of patient [email or web address] Password [email or web address]  08/23/2017, 1:15 AM

## 2017-08-23 NOTE — ED Notes (Signed)
Pt BP elevated, provider notified. Pt  Given meds. Pt stable. Pt assisted to urinate, gait unsteady when standing.

## 2017-08-23 NOTE — Progress Notes (Signed)
Not getting sustained response to hydralazine.  Will try Clonadine 0.2mg  TID first dose now to see if this helps.

## 2017-08-23 NOTE — ED Notes (Signed)
Attempted to call report to [address] x 1. Will call again to give report.

## 2017-08-23 NOTE — ED Notes (Signed)
Patient had urinated in his pants, and spilled urine from urinal on stretcher. Bedding changed on stretcher. Wet clothing removed. Clean gown placed on patient. Patient placed in bed. No distress noted.

## 2017-08-24 DIAGNOSIS — N183 Chronic kidney disease, stage 3 (moderate): Secondary | ICD-10-CM

## 2017-08-24 DIAGNOSIS — I161 Hypertensive emergency: Principal | ICD-10-CM

## 2017-08-24 DIAGNOSIS — F1410 Cocaine abuse, uncomplicated: Secondary | ICD-10-CM

## 2017-08-24 LAB — CBC
HCT: 31.4 % — ABNORMAL LOW (ref 39.0–52.0)
Hemoglobin: 10.5 g/dL — ABNORMAL LOW (ref 13.0–17.0)
MCH: 29.3 pg (ref 26.0–34.0)
MCHC: 33.4 g/dL (ref 30.0–36.0)
MCV: 87.7 fL (ref 78.0–100.0)
Platelets: 114 10*3/uL — ABNORMAL LOW (ref 150–400)
RBC: 3.58 MIL/uL — ABNORMAL LOW (ref 4.22–5.81)
RDW: 15.1 % (ref 11.5–15.5)
WBC: 4 10*3/uL (ref 4.0–10.5)

## 2017-08-24 LAB — BASIC METABOLIC PANEL
Anion gap: 8 (ref 5–15)
BUN: 37 mg/dL — ABNORMAL HIGH (ref 6–20)
CO2: 23 mmol/L (ref 22–32)
Calcium: 8.4 mg/dL — ABNORMAL LOW (ref 8.9–10.3)
Chloride: 105 mmol/L (ref 101–111)
Creatinine, Ser: 2.54 mg/dL — ABNORMAL HIGH (ref 0.61–1.24)
GFR calc Af Amer: 30 mL/min — ABNORMAL LOW (ref >60)
GFR calc non Af Amer: 26 mL/min — ABNORMAL LOW (ref >60)
Glucose, Bld: 110 mg/dL — ABNORMAL HIGH (ref 65–99)
Potassium: 3.9 mmol/L (ref 3.5–5.1)
Sodium: 136 mmol/L (ref 135–145)

## 2017-08-24 LAB — GLUCOSE, CAPILLARY
Glucose-Capillary: 93 mg/dL (ref 65–99)
Glucose-Capillary: 107 mg/dL — ABNORMAL HIGH (ref 65–99)

## 2017-08-24 LAB — HIV-1 RNA QUANT-NO REFLEX-BLD
HIV 1 RNA Quant: 103000 copies/mL
LOG10 HIV-1 RNA: 5.013 log10copy/mL

## 2017-08-24 LAB — HEMOGLOBIN A1C
Hgb A1c MFr Bld: 4.5 % — ABNORMAL LOW (ref 4.8–5.6)
Mean Plasma Glucose: 82.45 mg/dL

## 2017-08-24 MED ORDER — ATORVASTATIN CALCIUM 20 MG PO TABS
20.0000 mg | ORAL_TABLET | Freq: Every day | ORAL | Status: DC
  Administered 2017-08-24: 20 mg via ORAL
  Filled 2017-08-24: qty 1

## 2017-08-24 MED ORDER — SODIUM CHLORIDE 0.9 % IV SOLN
INTRAVENOUS | Status: DC
  Administered 2017-08-24: 17:00:00 via INTRAVENOUS

## 2017-08-24 MED ORDER — ESCITALOPRAM OXALATE 10 MG PO TABS
10.0000 mg | ORAL_TABLET | Freq: Every day | ORAL | Status: DC
  Administered 2017-08-24 – 2017-08-25 (×2): 10 mg via ORAL
  Filled 2017-08-24 (×2): qty 1

## 2017-08-24 MED ORDER — ENSURE ENLIVE PO LIQD
237.0000 mL | Freq: Two times a day (BID) | ORAL | Status: DC
  Administered 2017-08-24 – 2017-08-25 (×3): 237 mL via ORAL

## 2017-08-24 NOTE — Progress Notes (Signed)
PROGRESS NOTE  Peter Cox RSW:[id number] DOB: [birth year] DOA: 08/22/2017 PCP: Patient, No Pcp Per  HPI/Recap of past 24 hours: The patient is a 57 year old male with past medical history significant for HIV polysubstance abuse including alcohol, marijuana, and cocaine abuse from home who presents to the ED with complaints of right arm twitching which could voluntarily control.  Upon presentation patient was found to be in hypertensive emergency with blood pressure in the 230s with AK I on CKD 3.  08/23/2017 patient was seen and examined in the emergency room.  He is somnolent and minimally interactive.  States he had without contrast revealed chronic microvascular ischemic disease chronic right basal ganglia and bilateral internal capsular lacunar infarcts with no acute intracranial abnormality.  Patient is admitted for hypertensive emergency with a chaotic in the setting of suspected medication noncompliance and cocaine abuse.  UDS positive for cocaine and THC (08/22/17).    08/24/2017: Patient is more alert today.  He reports feeling depressed with no suicidal or homicidal ideation.  Started on Lexapro.  Patient admits he has not taken his HIV medications in more than a year.  He will follow-up with infectious disease clinic after discharge.   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)   Cocaine abuse ([place])   Acute kidney injury superimposed on CKD ([place])   Hypertensive emergency  Hypertensive emergency -Blood pressure in the 200 on presentation -Noncompliance with medications -Acute renal injury -Started on clonidine and amlodipine -Blood pressure is improving  A KI on CKD 3 -Baseline creatinine 1.3 -Creatinine 2.02 -Avoid uncontrolled hypertension -Avoid nephrotoxic agents -Avoid dehydration -Gentle IV  fluid hydration -Encourage increase of oral intake -Repeat BMP in the morning  Polysubstance abuse including cocaine and THC -Cessation counseling done at bedside  HIV positive -Stopped taking his HIV medications more than 1 year ago -Patient advised to follow-up with the infectious disease clinic after discharge -Will restart HIV medicationS in the outpatient setting if agreeable -CD4 340, CD4 percent helper T cells 28  Chronic normocytic anemia -No sign of overt bleeding -Hemoglobin 10.5 -Repeat CBC in the morning  Chronic microvascular ischemic disease/chronic right basal ganglial and bilateral internal capsule lacunar infarcts -Noted -Physical therapy to evaluate -Fall precautions  Depression/anxiety -Lexapro started today -No suicidal or homicidal ideation -We will follow-up with [facility] outpatient psych  Hyperlipidemia -Lipitor   Code Status: Full code   Family Communication: Stepdaughter at bedside  Disposition Plan: Discharge tomorrow if blood pressure is well controlled and no events overnight.  Started on new medication Lexapro.   Consultants:  None  Procedures:  None  Antimicrobials: None  DVT prophylaxis: SCDs   Objective: Vitals:   08/24/17 0808 08/24/17 0945 08/24/17 1137 08/24/17 1526  BP: (!) 149/124 (!) 152/94 (!) 165/99 139/89  Pulse: 75  72   Resp:      Temp: 97.8 F (36.6 C)  (!) 97.5 F (36.4 C)   TempSrc: Oral  Oral   SpO2:      Weight:      Height:        Intake/Output Summary (Last 24 hours) at 08/24/2017 1546 Last data filed at 08/24/2017 1300 Gross per 24 hour  Intake 1080 ml  Output 100 ml  Net 980 ml   Filed Weights   08/24/17 0343  Weight: 56.7 kg (125 lb 0 oz)  Exam:   General: 57 year old African-American male thin built BMI 20 alert and oriented x3 in no acute distress  Cardiovascular: Regular rate and rhythm no rubs or gallops  Respiratory: Clear to auscultation no wheezes or rales  Abdomen: Soft  nontender nondistended normal bowel sounds x4 quadrant  Musculoskeletal: No focal abnormalities  Skin: No rash  Psychiatry: Depressed mood   Data Reviewed: CBC: Recent Labs  Lab 08/22/17 2030  WBC 3.7*  NEUTROABS 2.1  HGB 11.3*  HCT 34.0*  MCV 87.0  PLT 440*   Basic Metabolic Panel: Recent Labs  Lab 08/22/17 2030 08/23/17 0208  NA 140 140  K 4.0 3.7  CL 107 107  CO2 28 26  GLUCOSE 71 86  BUN 22* 21*  CREATININE 2.06* 2.02*  CALCIUM 8.6* 8.6*   GFR: Estimated Creatinine Clearance: 32 mL/min (A) (by C-G formula based on SCr of 2.02 mg/dL (H)). Liver Function Tests: Recent Labs  Lab 08/22/17 2030  AST 22  ALT 15*  ALKPHOS 102  BILITOT 0.6  PROT 7.6  ALBUMIN 3.0*   No results for input(s): LIPASE, AMYLASE in the last 168 hours. No results for input(s): AMMONIA in the last 168 hours. Coagulation Profile: No results for input(s): INR, PROTIME in the last 168 hours. Cardiac Enzymes: No results for input(s): CKTOTAL, CKMB, CKMBINDEX, TROPONINI in the last 168 hours. BNP (last 3 results) No results for input(s): PROBNP in the last 8760 hours. HbA1C: No results for input(s): HGBA1C in the last 72 hours. CBG: No results for input(s): GLUCAP in the last 168 hours. Lipid Profile: No results for input(s): CHOL, HDL, LDLCALC, TRIG, CHOLHDL, LDLDIRECT in the last 72 hours. Thyroid Function Tests: No results for input(s): TSH, T4TOTAL, FREET4, T3FREE, THYROIDAB in the last 72 hours. Anemia Panel: No results for input(s): VITAMINB12, FOLATE, FERRITIN, TIBC, IRON, RETICCTPCT in the last 72 hours. Urine analysis:    Component Value Date/Time   COLORURINE YELLOW 08/22/2017 2023   APPEARANCEUR HAZY (A) 08/22/2017 2023   LABSPEC 1.024 08/22/2017 2023   PHURINE 5.0 08/22/2017 2023   GLUCOSEU NEGATIVE 08/22/2017 2023   GLUCOSEU NEG mg/dL 08/28/2008 2107   HGBUR SMALL (A) 08/22/2017 2023   HGBUR large 06/24/2008 0948   BILIRUBINUR NEGATIVE 08/22/2017 2023    KETONESUR NEGATIVE 08/22/2017 2023   PROTEINUR >=300 (A) 08/22/2017 2023   UROBILINOGEN 0.2 12/07/2012 1042   NITRITE NEGATIVE 08/22/2017 2023   LEUKOCYTESUR NEGATIVE 08/22/2017 2023   Sepsis Labs: @LABRCNTIP (procalcitonin:4,lacticidven:4)  ) Recent Results (from the past 240 hour(s))  MRSA PCR Screening     Status: None   Collection Time: 08/23/17  4:12 PM  Result Value Ref Range Status   MRSA by PCR NEGATIVE NEGATIVE Final    Comment:        The GeneXpert MRSA Assay (FDA approved for NASAL specimens only), is one component of a comprehensive MRSA colonization surveillance program. It is not intended to diagnose MRSA infection nor to guide or monitor treatment for MRSA infections. Performed at [facility],  [address], [place], [place] [address]       Studies: No results found.  Scheduled Meds: . amLODipine  10 mg Oral Daily  . atorvastatin  20 mg Oral q1800  . cloNIDine  0.2 mg Oral TID  . escitalopram  10 mg Oral Daily  . feeding supplement (ENSURE ENLIVE)  237 mL Oral BID BM  . folic acid  1 mg Oral Daily  . heparin  5,000 Units Subcutaneous Q8H  . multivitamin  with minerals  1 tablet Oral Daily  . thiamine  100 mg Oral Daily   Or  . thiamine  100 mg Intravenous Daily    Continuous Infusions:   LOS: 1 day     Kayleen Memos, MD [facility] Pager [phone]  If 7PM-7AM, please contact night-coverage [email or web address] Password [facility] 08/24/2017, 3:46 PM

## 2017-08-24 NOTE — Evaluation (Signed)
Physical Therapy Evaluation Patient Details Name: Peter Cox MRN: [id number] DOB: [birth year] Today's Date: 08/24/2017   History of Present Illness  Pt is a 57 y/o male admitted secondary to RUE twitching. Found to have hypertensive urgency. CT was negative for acute abnormality, however, revealed chronic infarcts, and chronic microvascular ischemic disease. PMH includes dementia, HIV, CVA, HTN, subastance abuse, and tobacco abuse.   Clinical Impression  Pt admitted secondary to problem above with deficits below. Upon eval, pt presenting with cognitive deficits, balance deficits, and decreased safety awareness with mobility. Required min to min guard A with use of RW this session. Unsure of caregiver support at home, and fee pt will need 24/7 support. If family unable to provide, may need to consider short term SNF prior to d/c home to increase safety with mobility prior to return home. Will continue to follow acutely to maximize functional mobility independence and safety.     Follow Up Recommendations Home health PT;Supervision/Assistance - 24 hour    Equipment Recommendations  Rolling walker with 5" wheels    Recommendations for Other Services OT consult     Precautions / Restrictions Precautions Precautions: Fall Restrictions Weight Bearing Restrictions: No      Mobility  Bed Mobility Overal bed mobility: Modified Independent             General bed mobility comments: Increased time, however, no assist required.   Transfers Overall transfer level: Needs assistance Equipment used: Rolling walker (2 wheeled) Transfers: Sit to/from Stand Sit to Stand: Min guard         General transfer comment: Min guard for safety. Verbal cues for safe hand placement.   Ambulation/Gait Ambulation/Gait assistance: Min guard;Min assist Ambulation Distance (Feet): 50 Feet Assistive device: Rolling walker (2 wheeled) Gait Pattern/deviations: Step-through pattern;Decreased stride  length;Drifts right/left Gait velocity: Decreased  Gait velocity interpretation: Below normal speed for age/gender General Gait Details: Slow, unsteady gait. LOB X 2 requiring min A for steadying. Otherwise required min guard assist. Required safety cues throughout for safe use of RW as pt would pick up during turns.   Stairs            Wheelchair Mobility    Modified Rankin (Stroke Patients Only)       Balance Overall balance assessment: Needs assistance Sitting-balance support: No upper extremity supported;Feet supported Sitting balance-Leahy Scale: Fair     Standing balance support: Bilateral upper extremity supported;During functional activity Standing balance-Leahy Scale: Poor Standing balance comment: Reliant on BUE support.                              Pertinent Vitals/Pain Pain Assessment: No/denies pain    Home Living Family/patient expects to be discharged to:: Private residence Living Arrangements: Children Available Help at Discharge: Family;Available PRN/intermittently Type of Home: Apartment Home Access: Stairs to enter Entrance Stairs-Rails: Left Entrance Stairs-Number of Steps: 20 Home Layout: One level Home Equipment: Cane - single point      Prior Function Level of Independence: Independent               Hand Dominance   Dominant Hand: Right    Extremity/Trunk Assessment   Upper Extremity Assessment Upper Extremity Assessment: Defer to OT evaluation    Lower Extremity Assessment Lower Extremity Assessment: Generalized weakness    Cervical / Trunk Assessment Cervical / Trunk Assessment: Kyphotic  Communication   Communication: No difficulties  Cognition Arousal/Alertness: Awake/alert Behavior During Therapy: [facility]  for tasks assessed/performed Overall Cognitive Status: No family/caregiver present to determine baseline cognitive functioning Area of Impairment: Orientation;Safety/judgement;Following commands;Problem  solving                 Orientation Level: Disoriented to;Time;Place;Situation     Following Commands: Follows one step commands with increased time Safety/Judgement: Decreased awareness of safety;Decreased awareness of deficits   Problem Solving: Slow processing General Comments: Pt alert to self. Able to state he was in a hospital, however, stated he was in [place]. Could state correct year, however, not the month. Demonstrated decreased safety awareness throughout.       General Comments      Exercises     Assessment/Plan    PT Assessment Patient needs continued PT services  PT Problem List Decreased strength;Decreased balance;Decreased mobility;Decreased cognition;Decreased knowledge of use of DME;Decreased safety awareness;Decreased knowledge of precautions       PT Treatment Interventions Gait training;DME instruction;Stair training;Therapeutic activities;Therapeutic exercise;Functional mobility training;Balance training;Neuromuscular re-education;Cognitive remediation;Patient/family education    PT Goals (Current goals can be found in the Care Plan section)  Acute Rehab PT Goals Patient Stated Goal: to go home  PT Goal Formulation: With patient Time For Goal Achievement: 09/07/17 Potential to Achieve Goals: Good    Frequency Min 3X/week   Barriers to discharge Other (comment) Unsure of caregiver assist at home     Co-evaluation               AM-PAC PT "6 Clicks" Daily Activity  Outcome Measure Difficulty turning over in bed (including adjusting bedclothes, sheets and blankets)?: None Difficulty moving from lying on back to sitting on the side of the bed? : A Little Difficulty sitting down on and standing up from a chair with arms (e.g., wheelchair, bedside commode, etc,.)?: Unable Help needed moving to and from a bed to chair (including a wheelchair)?: A Little Help needed walking in hospital room?: A Little Help needed climbing 3-5 steps with a  railing? : A Little 6 Click Score: 17    End of Session Equipment Utilized During Treatment: Gait belt Activity Tolerance: Patient tolerated treatment well Patient left: in bed;with call bell/phone within reach;with bed alarm set Nurse Communication: Mobility status PT Visit Diagnosis: Unsteadiness on feet (R26.81);Other abnormalities of gait and mobility (R26.89);Muscle weakness (generalized) (M62.81)    Time: 1779-3903 PT Time Calculation (min) (ACUTE ONLY): 24 min   Charges:   PT Evaluation $PT Eval Low Complexity: 1 Low PT Treatments $Gait Training: 8-22 mins   PT G Codes:       Leighton Ruff, PT, DPT  Acute Rehabilitation Services  Pager: [phone]   Rudean Hitt 08/24/2017, 6:07 PM

## 2017-08-24 NOTE — Progress Notes (Signed)
Initial Nutrition Assessment  DOCUMENTATION CODES:   Not applicable  INTERVENTION:   Ensure Enlive (chocolate or strawberry) po BID, each supplement provides 350kcals and 20g of protein.  NUTRITION DIAGNOSIS:   Increased nutrient needs related to chronic illness(HIV) as evidenced by estimated needs.  GOAL:   Patient will meet greater than or equal to 90% of their needs  MONITOR:   PO intake, Labs, I & O's, Supplement acceptance  REASON FOR ASSESSMENT:   Malnutrition Screening Tool    ASSESSMENT:   57 y.o. male with PMH of HIV, polysubstance abuse (EtOH, cocaine, THC), HTN, non-compliance with meds, dementia, CVA. Presented to the ED with c/o R arm twitching; found to be in hypertensive emergency with blood pressure in the 230s and AKI on CKD 3.  Pt reports having a good appetite without N/V/D or constipation currently or PTA.   Pt denies pain or issues with chewing/swallowing despite poor dentition.   Pt reports eating 100% of breakfast this morning and all of lunch and dinner yesterday. PTA pt reports he eats 3 meals a day with 2 snacks. He usually eats grits, eggs, bacon/sausage for breakfast, sandwiches for lunch, ham sandwiches or hamburgers with greens for dinner. He also drinks slimfast 1x/day. Intern recommended a higher protein supplement such as Ensure or Boost as well as an MVI upon D/C.   Pt is at risk for malnutrition given chronic disease status and NFPE findings outlined below.   Pt reports wt loss of "50%." Limited wt hx in the medical record as outlined below. Pt appears to be wt stable.   Medications: Folic acid, MVI, thiamin.  Labs from 3/6: BUN (H;21), Cr (H;2.02), CD4T cell (L;340) 3/5 - Corrected Ca (WNL); albumin (L;3.0)  NUTRITION - FOCUSED PHYSICAL EXAM:    Most Recent Value  Orbital Region  No depletion  Upper Arm Region  Severe depletion  Thoracic and Lumbar Region  Moderate depletion  Buccal Region  No depletion  Temple Region  No  depletion  Clavicle Bone Region  Severe depletion  Clavicle and Acromion Bone Region  Moderate depletion [left side, right side mild]  Scapular Bone Region  Moderate depletion [left side, right side mild]  Dorsal Hand  No depletion  Patellar Region  Moderate depletion  Anterior Thigh Region  Moderate depletion  Posterior Calf Region  Moderate depletion  Edema (RD Assessment)  None  Hair  Reviewed  Eyes  Reviewed  Mouth  Reviewed  Skin  Reviewed  Nails  Reviewed      Diet Order:  Diet Heart Room service appropriate? Yes; Fluid consistency: Thin  EDUCATION NEEDS:   Education needs have been addressed  Skin:  Skin Assessment: Reviewed RN Assessment  Last BM:  3/6  Height:   Ht Readings from Last 1 Encounters:  08/24/17 5\' 5"  (1.651 m)    Weight:   Wt Readings from Last 15 Encounters:  08/24/17 125 lb 0 oz (56.7 kg)  09/10/15 128 lb (58.1 kg)  11/24/14 128 lb (58.1 kg)  10/20/14 130 lb (59 kg)  10/06/14 132 lb (59.9 kg)  09/09/14 133 lb (60.3 kg)  08/11/14 132 lb 8 oz (60.1 kg)  08/07/14 131 lb 3.2 oz (59.5 kg)  07/09/14 133 lb (60.3 kg)  06/25/14 133 lb (60.3 kg)  04/30/14 129 lb 1.6 oz (58.6 kg)  03/24/14 128 lb (58.1 kg)  09/02/13 134 lb (60.8 kg)  06/25/13 142 lb (64.4 kg)  04/15/13 134 lb 11.2 oz (61.1 kg)   09/09/16  -  129lb 14.4oz at [facility] Body Weight:  61.8 kg  BMI:  Body mass index is 20.8 kg/m.  Estimated Nutritional Needs:   Kcal:  1800-2000 (32-35kcal/kg)  Protein:  75-85g (1.3-1.5g/kg)  Fluid:  1.8-2L/day    Yannis Broce, MS, Dietetic Intern Pager # [phone]

## 2017-08-25 LAB — GLUCOSE, CAPILLARY
Glucose-Capillary: 93 mg/dL (ref 65–99)
Glucose-Capillary: 96 mg/dL (ref 65–99)
Glucose-Capillary: 108 mg/dL — ABNORMAL HIGH (ref 65–99)

## 2017-08-25 LAB — CREATININE, SERUM
Creatinine, Ser: 2.23 mg/dL — ABNORMAL HIGH (ref 0.61–1.24)
GFR calc Af Amer: 36 mL/min — ABNORMAL LOW (ref >60)
GFR calc non Af Amer: 31 mL/min — ABNORMAL LOW (ref >60)

## 2017-08-25 MED ORDER — FOLIC ACID 1 MG PO TABS: 1.0000 mg | ORAL_TABLET | Freq: Every day | ORAL | 0 refills | Status: DC

## 2017-08-25 MED ORDER — CLONIDINE HCL 0.3 MG PO TABS
0.3000 mg | ORAL_TABLET | Freq: Three times a day (TID) | ORAL | Status: DC
  Administered 2017-08-25: 0.3 mg via ORAL
  Filled 2017-08-25: qty 1

## 2017-08-25 MED ORDER — ESCITALOPRAM OXALATE 10 MG PO TABS: 10.0000 mg | ORAL_TABLET | Freq: Every day | ORAL | 0 refills | Status: DC

## 2017-08-25 MED ORDER — ENSURE ENLIVE PO LIQD: 237.0000 mL | Freq: Two times a day (BID) | ORAL | 0 refills | Status: DC

## 2017-08-25 MED ORDER — CLONIDINE HCL 0.3 MG PO TABS: 0.3000 mg | ORAL_TABLET | Freq: Three times a day (TID) | ORAL | 0 refills | Status: DC

## 2017-08-25 MED ORDER — THIAMINE HCL 100 MG PO TABS: 100.0000 mg | ORAL_TABLET | Freq: Every day | ORAL | 0 refills | Status: DC

## 2017-08-25 MED ORDER — ADULT MULTIVITAMIN W/MINERALS CH: 1.0000 | ORAL_TABLET | Freq: Every day | ORAL | 0 refills | Status: DC

## 2017-08-25 MED ORDER — ATORVASTATIN CALCIUM 20 MG PO TABS: 20.0000 mg | ORAL_TABLET | Freq: Every day | ORAL | 0 refills | Status: DC

## 2017-08-25 MED ORDER — CLONIDINE HCL 0.2 MG PO TABS: 0.2000 mg | ORAL_TABLET | Freq: Three times a day (TID) | ORAL | 0 refills | Status: DC

## 2017-08-25 MED ORDER — ASPIRIN 81 MG PO TABS: 81.0000 mg | ORAL_TABLET | Freq: Every day | ORAL | 0 refills | Status: DC

## 2017-08-25 MED ORDER — AMLODIPINE BESYLATE 10 MG PO TABS: 10.0000 mg | ORAL_TABLET | Freq: Every day | ORAL | 0 refills | Status: DC

## 2017-08-25 NOTE — Evaluation (Signed)
Occupational Therapy Evaluation Patient Details Name: Peter Cox MRN: [id number] DOB: [birth year] Today's Date: 08/25/2017    History of Present Illness Pt is a 57 y/o male admitted secondary to RUE twitching. Found to have hypertensive urgency. CT was negative for acute abnormality, however, revealed chronic infarcts, and chronic microvascular ischemic disease. PMH includes dementia, HIV, CVA, HTN, subastance abuse, and tobacco abuse.    Clinical Impression   Pt reports he was independent with ADL PTA. Currently pt overall close min guard for safety with ADL and functional mobility. Pt with poor safety awareness and insight into his deficits making him a high fall and safety risk at this time. Pt is unaware of balance deficits despite apparent limitations during functional tasks. If pt were to return home, highly recommend that he has 24/7 supervision. If family unable to provide 24/7 supervision; feel short term SNF would be a good option for continued rehab. Also recommending HHOT to maximize independence and safety with ADL and functional mobility if pt to d/c home. Pt would benefit from continued skilled OT to address established goals.    Follow Up Recommendations  Home health OT;Supervision/Assistance - 24 hour(If does not have 24/7 S; would benefit from SNF placement)    Equipment Recommendations  3 in 1 bedside commode    Recommendations for Other Services       Precautions / Restrictions Precautions Precautions: Fall Restrictions Weight Bearing Restrictions: No      Mobility Bed Mobility Overal bed mobility: Needs Assistance Bed Mobility: Supine to Sit;Sit to Supine     Supine to sit: Supervision Sit to supine: Supervision   General bed mobility comments: Pt. required no assist during bed mobility but needs supervision for safety reasons.   Transfers Overall transfer level: Needs assistance Equipment used: Rolling walker (2 wheeled) Transfers: Sit to/from  Stand Sit to Stand: Min guard         General transfer comment: Pt. requires min guard assist for safety due to impaired balance. Pt. required cueing for hand placement on walker.    Balance Overall balance assessment: Needs assistance Sitting-balance support: No upper extremity supported;Feet supported Sitting balance-Leahy Scale: Fair Sitting balance - Comments: Losing balance posteriorly at times   Standing balance support: No upper extremity supported;During functional activity Standing balance-Leahy Scale: Fair Standing balance comment: Close min guard for safety during grooming tasks at sink                           ADL either performed or assessed with clinical judgement   ADL Overall ADL's : Needs assistance/impaired Eating/Feeding: Set up;Sitting   Grooming: Wash/dry hands;Min guard;Standing Grooming Details (indicate cue type and reason): Pt. required cues for sequencing of activity, such as applying soap before washing hands. Upper Body Bathing: Set up;Supervision/ safety;Sitting   Lower Body Bathing: Min guard;Sit to/from stand   Upper Body Dressing : Set up;Supervision/safety;Sitting   Lower Body Dressing: Min guard;Sit to/from stand Lower Body Dressing Details (indicate cue type and reason): Pt able to don underwear without physical assist, cues for safety and seated technique due to balance deficits. Close min guard for balance during sit to stand. Toilet Transfer: Min guard;Ambulation;RW Toilet Transfer Details (indicate cue type and reason): For safety due to impaired balance/coordination. Pt able to stand at toilet to void Toileting- Clothing Manipulation and Hygiene: [place] Manipulation Details (indicate cue type and reason): For safety due to impaired balance/coordination   Tub/Shower Transfer  Details (indicate cue type and reason): Discussed use of 3 in1 in tub as a seat due to balance deficits Functional mobility during  ADLs: Min guard;Cueing for safety;Rolling walker General ADL Comments: Pt. requires min guard during ADLs due to impaired balance as well as impaired coordination in the form of unsteady gait. Pt. required min verbal cueing during functional activities to follow proper sequencing. Pt. verbalized that he was unaware of unsteady gait.      Vision         Perception     Praxis      Pertinent Vitals/Pain Pain Assessment: No/denies pain     Hand Dominance Right   Extremity/Trunk Assessment Upper Extremity Assessment Upper Extremity Assessment: Generalized weakness(poor gross motor coordination bil)   Lower Extremity Assessment Lower Extremity Assessment: Defer to PT evaluation   Cervical / Trunk Assessment Cervical / Trunk Assessment: Kyphotic   Communication Communication Communication: No difficulties   Cognition Arousal/Alertness: Awake/alert Behavior During Therapy: Impulsive Overall Cognitive Status: No family/caregiver present to determine baseline cognitive functioning Area of Impairment: Following commands;Safety/judgement;Problem solving                       Following Commands: Follows one step commands inconsistently Safety/Judgement: Decreased awareness of safety;Decreased awareness of deficits   Problem Solving: Slow processing;Decreased initiation;Difficulty sequencing;Requires verbal cues General Comments: Pt unaware of decits despite obvious issues with balance during functional mobility and functional tasks. Pt with poor safety awareness and requires cues throughout functional activities for safety and sequencing.   General Comments       Exercises     Shoulder Instructions      Home Living Family/patient expects to be discharged to:: Private residence Living Arrangements: Other relatives(son) Available Help at Discharge: Family;Available PRN/intermittently Type of Home: Apartment Home Access: Stairs to enter Entrance Stairs-Number of  Steps: ~30(approximately 30) Entrance Stairs-Rails: Can reach both Home Layout: One level;Full bath on main level     Bathroom Shower/Tub: Teacher, early years/pre: Handicapped height     Home Equipment: None   Additional Comments: Pt reports he lives with son, RN reporting sister planning to take him home with her. Pt seems to be a poor historian, unsure of accuracy of information provided.      Prior Functioning/Environment Level of Independence: Independent                 OT Problem List: Decreased strength;Decreased activity tolerance;Impaired balance (sitting and/or standing);Decreased coordination;Decreased cognition;Decreased safety awareness;Decreased knowledge of use of DME or AE;Decreased knowledge of precautions      OT Treatment/Interventions: Self-care/ADL training;Energy conservation;DME and/or AE instruction;Therapeutic activities;Cognitive remediation/compensation;Patient/family education;Balance training    OT Goals(Current goals can be found in the care plan section) Acute Rehab OT Goals Patient Stated Goal: to go home  OT Goal Formulation: With patient Time For Goal Achievement: 09/08/17 Potential to Achieve Goals: Good ADL Goals Pt Will Perform Tub/Shower Transfer: with supervision;ambulating;3 in 1;rolling walker;Tub transfer Additional ADL Goal #1: Pt will independently demonstrate good safety awareness and safe use of RW during ADL. Additional ADL Goal #2: Pt will gather ADL items and perform ADL with mod I.  OT Frequency: Min 2X/week   Barriers to D/C: Decreased caregiver support  pt reports son works during the day       Co-evaluation              AM-PAC PT "6 Clicks" Daily Activity     Outcome Measure Help from another  person eating meals?: None Help from another person taking care of personal grooming?: A Little Help from another person toileting, which includes using toliet, bedpan, or urinal?: A Little Help from another  person bathing (including washing, rinsing, drying)?: A Little Help from another person to put on and taking off regular upper body clothing?: A Little Help from another person to put on and taking off regular lower body clothing?: A Little 6 Click Score: 19   End of Session Equipment Utilized During Treatment: Gait belt;Rolling walker Nurse Communication: Mobility status;Other (comment)(pt with poor safety awareness/insight into deficits)  Activity Tolerance: Patient tolerated treatment well Patient left: in bed;with call bell/phone within reach;with bed alarm set  OT Visit Diagnosis: Unsteadiness on feet (R26.81);Other abnormalities of gait and mobility (R26.89);Muscle weakness (generalized) (M62.81)                Time: 1500-1520 OT Time Calculation (min): 20 min Charges:  OT General Charges $OT Visit: 1 Visit OT Evaluation $OT Eval Moderate Complexity: 1 Mod G-Codes:     Perina Salvaggio A. Ulice Brilliant, M.S., OTR/L Pager: [place] 08/25/2017, 3:44 PM

## 2017-08-25 NOTE — Discharge Instructions (Signed)
Acute Kidney Injury, Adult Acute kidney injury is a sudden worsening of kidney function. The kidneys are organs that have several jobs. They filter the blood to remove waste products and extra fluid. They also maintain a healthy balance of minerals and hormones in the body, which helps control blood pressure and keep bones strong. With this condition, your kidneys do not do their jobs as well as they should. This condition ranges from mild to severe. Over time it may develop into long-lasting (chronic) kidney disease. Early detection and treatment may prevent acute kidney injury from developing into a chronic condition. What are the causes? Common causes of this condition include:  A problem with blood flow to the kidneys. This may be caused by: ? Low blood pressure (hypotension) or shock. ? Blood loss. ? Heart and blood vessel (cardiovascular) disease. ? Severe burns. ? Liver disease.  Direct damage to the kidneys. This may be caused by: ? Certain medicines. ? A kidney infection. ? Poisoning. ? Being around or in contact with toxic substances. ? A surgical wound. ? A hard, direct hit to the kidney area.  A sudden blockage of urine flow. This may be caused by: ? Cancer. ? Kidney stones. ? An enlarged prostate in males.  What are the signs or symptoms? Symptoms of this condition may not be obvious until the condition becomes severe. Symptoms of this condition can include:  Tiredness (lethargy), or difficulty staying awake.  Nausea or vomiting.  Swelling (edema) of the face, legs, ankles, or feet.  Problems with urination, such as: ? Abdominal pain, or pain along the side of your stomach (flank). ? Decreased urine production. ? Decrease in the force of urine flow.  Muscle twitches and cramps, especially in the legs.  Confusion or trouble concentrating.  Loss of appetite.  Fever.  How is this diagnosed? This condition may be diagnosed with tests, including:  Blood  tests.  Urine tests.  Imaging tests.  A test in which a sample of tissue is removed from the kidneys to be examined under a microscope (kidney biopsy).  How is this treated? Treatment for this condition depends on the cause and how severe the condition is. In mild cases, treatment may not be needed. The kidneys may heal on their own. In more severe cases, treatment will involve:  Treating the cause of the kidney injury. This may involve changing any medicines you are taking or adjusting your dosage.  Fluids. You may need specialized IV fluids to balance your body's needs.  Having a catheter placed to drain urine and prevent blockages.  Preventing problems from occurring. This may mean avoiding certain medicines or procedures that can cause further injury to the kidneys.  In some cases treatment may also require:  A procedure to remove toxic wastes from the body (dialysis or continuous renal replacement therapy - CRRT).  Surgery. This may be done to repair a torn kidney, or to remove the blockage from the urinary system.  Follow these instructions at home: Medicines  Take over-the-counter and prescription medicines only as told by your health care provider.  Do not take any new medicines without your health care provider's approval. Many medicines can worsen your kidney damage.  Do not take any vitamin and mineral supplements without your health care provider's approval. Many nutritional supplements can worsen your kidney damage. Lifestyle  If your health care provider prescribed changes to your diet, follow them. You may need to decrease the amount of protein you eat.  Achieve  and maintain a healthy weight. If you need help with this, ask your health care provider.  Start or continue an exercise plan. Try to exercise at least 30 minutes a day, 5 days a week.  Do not use any tobacco products, such as cigarettes, chewing tobacco, and e-cigarettes. If you need help quitting, ask  your health care provider. General instructions  Keep track of your blood pressure. Report changes in your blood pressure as told by your health care provider.  Stay up to date with immunizations. Ask your health care provider which immunizations you need.  Keep all follow-up visits as told by your health care provider. This is important. Where to find more information:  American Association of Kidney Patients: BombTimer.gl  National Kidney Foundation: www.kidney.[place]: https://mathis.com/  Life Options [facility]: ? www.lifeoptions.org ? www.kidneyschool.org Contact a health care provider if:  Your symptoms get worse.  You develop new symptoms. Get help right away if:  You develop symptoms of worsening kidney disease, which include: ? Headaches. ? Abnormally dark or light skin. ? Easy bruising. ? Frequent hiccups. ? Chest pain. ? Shortness of breath. ? End of menstruation in women. ? Seizures. ? Confusion or altered mental status. ? Abdominal or back pain. ? Itchiness.  You have a fever.  Your body is producing less urine.  You have pain or bleeding when you urinate. Summary  Acute kidney injury is a sudden worsening of kidney function.  Acute kidney injury can be caused by problems with blood flow to the kidneys, direct damage to the kidneys, and sudden blockage of urine flow.  Symptoms of this condition may not be obvious until it becomes severe. Symptoms may include edema, lethargy, confusion, nausea or vomiting, and problems passing urine.  This condition can usually be diagnosed with blood tests, urine tests, and imaging tests. Sometimes a kidney biopsy is done to diagnose this condition.  Treatment for this condition often involves treating the underlying cause. It is treated with fluids, medicines, dialysis, diet changes, or surgery. This information is not intended to replace advice given to you by your health care provider.  Make sure you discuss any questions you have with your health care provider. Document Released: 12/20/2010 Document Revised: 10/06/2016 Document Reviewed: 05/27/2016 Elsevier Interactive Patient Education  2018 [employer].   Preventing Hypertension Hypertension, commonly called high blood pressure, is when the force of blood pumping through the arteries is too strong. Arteries are blood vessels that carry blood from the heart throughout the body. Over time, hypertension can damage the arteries and decrease blood flow to important parts of the body, including the brain, heart, and kidneys. Often, hypertension does not cause symptoms until blood pressure is very high. For this reason, it is important to have your blood pressure checked on a regular basis. Hypertension can often be prevented with diet and lifestyle changes. If you already have hypertension, you can control it with diet and lifestyle changes, as well as medicine. What nutrition changes can be made? Maintain a healthy diet. This includes:  Eating less salt (sodium). Ask your health care provider how much sodium is safe for you to have. The general recommendation is to consume less than 1 tsp (2,300 mg) of sodium a day. ? Do not add salt to your food. ? Choose low-sodium options when grocery shopping and eating out.  Limiting fats in your diet. You can do this by eating low-fat or fat-free dairy products and by eating less red meat.  Eating more fruits, vegetables, and whole grains. Make a goal to eat: ? 1-2 cups of fresh fruits and vegetables each day. ? 3-4 servings of whole grains each day.  Avoiding foods and beverages that have added sugars.  Eating fish that contain healthy fats (omega-3 fatty acids), such as mackerel or salmon.  If you need help putting together a healthy eating plan, try the DASH diet. This diet is high in fruits, vegetables, and whole grains. It is low in sodium, red meat, and added sugars. DASH  stands for Dietary Approaches to Stop Hypertension. What lifestyle changes can be made?  Lose weight if you are overweight. Losing just 3?5% of your body weight can help prevent or control hypertension. ? For example, if your present weight is 200 lb (91 kg), a loss of 3-5% of your weight means losing 6-10 lb (2.7-4.5 kg). ? Ask your health care provider to help you with a diet and exercise plan to safely lose weight.  Get enough exercise. Do at least 150 minutes of moderate-intensity exercise each week. ? You could do this in short exercise sessions several times a day, or you could do longer exercise sessions a few times a week. For example, you could take a brisk 10-minute walk or bike ride, 3 times a day, for 5 days a week.  Find ways to reduce stress, such as exercising, meditating, listening to music, or taking a yoga class. If you need help reducing stress, ask your health care provider.  Do not smoke. This includes e-cigarettes. Chemicals in tobacco and nicotine products raise your blood pressure each time you smoke. If you need help quitting, ask your health care provider.  Avoid alcohol. If you drink alcohol, limit alcohol intake to no more than 1 drink a day for nonpregnant women and 2 drinks a day for men. One drink equals 12 oz of beer, 5 oz of wine, or 1 oz of hard liquor. Why are these changes important? Diet and lifestyle changes can help you prevent hypertension, and they may make you feel better overall and improve your quality of life. If you have hypertension, making these changes will help you control it and help prevent major complications, such as:  Hardening and narrowing of arteries that supply blood to: ? Your heart. This can cause a heart attack. ? Your brain. This can cause a stroke. ? Your kidneys. This can cause kidney failure.  Stress on your heart muscle, which can cause heart failure.  What can I do to lower my risk?  Work with your health care provider to  make a hypertension prevention plan that works for you. Follow your plan and keep all follow-up visits as told by your health care provider.  Learn how to check your blood pressure at home. Make sure that you know your personal target blood pressure, as told by your health care provider. How is this treated? In addition to diet and lifestyle changes, your health care provider may recommend medicines to help lower your blood pressure. You may need to try a few different medicines to find what works best for you. You also may need to take more than one medicine. Take over-the-counter and prescription medicines only as told by your health care provider. Where to find support: Your health care provider can help you prevent hypertension and help you keep your blood pressure at a healthy level. Your local hospital or your community may also provide support services and prevention programs. The American Heart Association  offers an online support network at: CheapBootlegs.com.cy Where to find more information: Learn more about hypertension from:  [employer], Lung, and Blood Institute: ElectronicHangman.is  Centers for Disease Control and Prevention: https://ingram.com/  American Academy of Family Physicians: http://familydoctor.org/familydoctor/en/diseases-conditions/high-blood-pressure.printerview.all.html  Learn more about the DASH diet from:  [place], Lung, and [place]: https://www.reyes.com/  Contact a health care provider if:  You think you are having a reaction to medicines you have taken.  You have recurrent headaches or feel dizzy.  You have swelling in your ankles.  You have trouble with your vision. Summary  Hypertension often does not cause any symptoms until blood pressure is very high. It is important to get your blood pressure checked regularly.  Diet and lifestyle  changes are the most important steps in preventing hypertension.  By keeping your blood pressure in a healthy range, you can prevent complications like heart attack, heart failure, stroke, and kidney failure.  Work with your health care provider to make a hypertension prevention plan that works for you. This information is not intended to replace advice given to you by your health care provider. Make sure you discuss any questions you have with your health care provider. Document Released: 06/21/2015 Document Revised: 02/15/2016 Document Reviewed: 02/15/2016 Elsevier Interactive Patient Education  2018 [employer].   Preventing Cerebrovascular Disease Arteries are blood vessels that carry blood that contains oxygen from the heart to all parts of the body. Cerebrovascular disease affects arteries that supply the brain. Any condition that blocks or disrupts blood flow to the brain can cause cerebrovascular disease. Brain cells that lose blood supply start to die within minutes (stroke). Stroke is the main danger of cerebrovascular disease. Atherosclerosis and high blood pressure are common causes of cerebrovascular disease. Atherosclerosis is narrowing and hardening of an artery that results when fat, cholesterol, calcium, or other substances (plaque) build up inside an artery. Plaque reduces blood flow through the artery. High blood pressure increases the risk of bleeding inside the brain. Making diet and lifestyle changes to prevent atherosclerosis and high blood pressure lowers your risk of cerebrovascular disease. What nutrition changes can be made?  Eat more fruits, vegetables, and whole grains.  Reduce how much saturated fat you eat. To do this, eat less red meat and fewer full-fat dairy products.  Eat healthy proteins instead of red meat. Healthy proteins include: ? Fish. Eat fish that contains heart-healthy omega-3 fatty acids, twice a week. Examples include salmon, albacore tuna,  mackerel, and herring. ? Chicken. ? Nuts. ? Low-fat or nonfat yogurt.  Avoid processed meats, like bacon and lunchmeat.  Avoid foods that contain: ? A lot of sugar, such as sweets and drinks with added sugar. ? A lot of salt (sodium). Avoid adding extra salt to your food, as told by your health care provider. ? Trans fats, such as margarine and baked goods. Trans fats may be listed as "partially hydrogenated oils on food labels.  Check food labels to see how much sodium, sugar, and trans fats are in foods.  Use vegetable oils that contain low amounts of saturated fat, such as olive oil or canola oil. What lifestyle changes can be made?  Drink alcohol in moderation. This means no more than 1 drink a day for nonpregnant women and 2 drinks a day for men. One drink equals 12 oz of beer, 5 oz of wine, or 1 oz of hard liquor.  If you are overweight, ask your health care provider to recommend a weight-loss plan for  you. Losing 5-10 lb (2.2-4.5 kg) can reduce your risk of diabetes, atherosclerosis, and high blood pressure.  Exercise for 30?60 minutes on most days, or as much as told by your health care provider. ? Do moderate-intensity exercise, such as brisk walking, bicycling, and water aerobics. Ask your health care provider which activities are safe for you.  Do not use any products that contain nicotine or tobacco, such as cigarettes and e-cigarettes. If you need help quitting, ask your health care provider. Why are these changes important? Making these changes lowers your risk of many diseases that can cause cerebrovascular disease and stroke. Stroke is a leading cause of death and disability. Making these changes also improves your overall health and quality of life. What can I do to lower my risk? The following factors make you more likely to develop cerebrovascular disease:  Being overweight.  Smoking.  Being physically inactive.  Eating a high-fat diet.  Having certain  health conditions, such as: ? Diabetes. ? High blood pressure. ? Heart disease. ? Atherosclerosis. ? High cholesterol. ? Sickle cell disease.  Talk with your health care provider about your risk for cerebrovascular disease. Work with your health care provider to control diseases that you have that may contribute to cerebrovascular disease. Your health care provider may prescribe medicines to help prevent major causes of cerebrovascular disease. Where to find more information: Learn more about preventing cerebrovascular disease from:  [place], Lung, and [place]: MoAnalyst.de  Centers for Disease Control and Prevention: http://www.curry-wood.biz/  Summary  Cerebrovascular disease can lead to a stroke.  Atherosclerosis and high blood pressure are major causes of cerebrovascular disease.  Making diet and lifestyle changes can reduce your risk of cerebrovascular disease.  Work with your health care provider to get your risk factors under control to reduce your risk of cerebrovascular disease. This information is not intended to replace advice given to you by your health care provider. Make sure you discuss any questions you have with your health care provider. Document Released: 06/21/2015 Document Revised: 12/25/2015 Document Reviewed: 06/21/2015 Elsevier Interactive Patient Education  2018 [employer].   Managing Your Hypertension Hypertension is commonly called high blood pressure. This is when the force of your blood pressing against the walls of your arteries is too strong. Arteries are blood vessels that carry blood from your heart throughout your body. Hypertension forces the heart to work harder to pump blood, and may cause the arteries to become narrow or stiff. Having untreated or uncontrolled hypertension can cause heart attack, stroke, kidney disease, and other problems. What are blood pressure readings? A blood pressure  reading consists of a higher number over a lower number. Ideally, your blood pressure should be below 120/80. The first ("top") number is called the systolic pressure. It is a measure of the pressure in your arteries as your heart beats. The second ("bottom") number is called the diastolic pressure. It is a measure of the pressure in your arteries as the heart relaxes. What does my blood pressure reading mean? Blood pressure is classified into four stages. Based on your blood pressure reading, your health care provider may use the following stages to determine what type of treatment you need, if any. Systolic pressure and diastolic pressure are measured in a unit called mm Hg. Normal  Systolic pressure: below 188.  Diastolic pressure: below 80. Elevated  Systolic pressure: 416-606.  Diastolic pressure: below 80. Hypertension stage 1  Systolic pressure: 301-601.  Diastolic pressure: 09-32. Hypertension stage 2  Systolic pressure: 427 or above.  Diastolic pressure: 90 or above. What health risks are associated with hypertension? Managing your hypertension is an important responsibility. Uncontrolled hypertension can lead to:  A heart attack.  A stroke.  A weakened blood vessel (aneurysm).  Heart failure.  Kidney damage.  Eye damage.  Metabolic syndrome.  Memory and concentration problems.  What changes can I make to manage my hypertension? Hypertension can be managed by making lifestyle changes and possibly by taking medicines. Your health care provider will help you make a plan to bring your blood pressure within a normal range. Eating and drinking  Eat a diet that is high in fiber and potassium, and low in salt (sodium), added sugar, and fat. An example eating plan is called the DASH (Dietary Approaches to Stop Hypertension) diet. To eat this way: ? Eat plenty of fresh fruits and vegetables. Try to fill half of your plate at each meal with fruits and vegetables. ? Eat  whole grains, such as whole wheat pasta, brown rice, or whole grain bread. Fill about one quarter of your plate with whole grains. ? Eat low-fat diary products. ? Avoid fatty cuts of meat, processed or cured meats, and poultry with skin. Fill about one quarter of your plate with lean proteins such as fish, chicken without skin, beans, eggs, and tofu. ? Avoid premade and processed foods. These tend to be higher in sodium, added sugar, and fat.  Reduce your daily sodium intake. Most people with hypertension should eat less than 1,500 mg of sodium a day.  Limit alcohol intake to no more than 1 drink a day for nonpregnant women and 2 drinks a day for men. One drink equals 12 oz of beer, 5 oz of wine, or 1 oz of hard liquor. Lifestyle  Work with your health care provider to maintain a healthy body weight, or to lose weight. Ask what an ideal weight is for you.  Get at least 30 minutes of exercise that causes your heart to beat faster (aerobic exercise) most days of the week. Activities may include walking, swimming, or biking.  Include exercise to strengthen your muscles (resistance exercise), such as weight lifting, as part of your weekly exercise routine. Try to do these types of exercises for 30 minutes at least 3 days a week.  Do not use any products that contain nicotine or tobacco, such as cigarettes and e-cigarettes. If you need help quitting, ask your health care provider.  Control any long-term (chronic) conditions you have, such as high cholesterol or diabetes. Monitoring  Monitor your blood pressure at home as told by your health care provider. Your personal target blood pressure may vary depending on your medical conditions, your age, and other factors.  Have your blood pressure checked regularly, as often as told by your health care provider. Working with your health care provider  Review all the medicines you take with your health care provider because there may be side effects or  interactions.  Talk with your health care provider about your diet, exercise habits, and other lifestyle factors that may be contributing to hypertension.  Visit your health care provider regularly. Your health care provider can help you create and adjust your plan for managing hypertension. Will I need medicine to control my blood pressure? Your health care provider may prescribe medicine if lifestyle changes are not enough to get your blood pressure under control, and if:  Your systolic blood pressure is 130 or higher.  Your diastolic blood  pressure is 80 or higher.  Take medicines only as told by your health care provider. Follow the directions carefully. Blood pressure medicines must be taken as prescribed. The medicine does not work as well when you skip doses. Skipping doses also puts you at risk for problems. Contact a health care provider if:  You think you are having a reaction to medicines you have taken.  You have repeated (recurrent) headaches.  You feel dizzy.  You have swelling in your ankles.  You have trouble with your vision. Get help right away if:  You develop a severe headache or confusion.  You have unusual weakness or numbness, or you feel faint.  You have severe pain in your chest or abdomen.  You vomit repeatedly.  You have trouble breathing. Summary  Hypertension is when the force of blood pumping through your arteries is too strong. If this condition is not controlled, it may put you at risk for serious complications.  Your personal target blood pressure may vary depending on your medical conditions, your age, and other factors. For most people, a normal blood pressure is less than 120/80.  Hypertension is managed by lifestyle changes, medicines, or both. Lifestyle changes include weight loss, eating a healthy, low-sodium diet, exercising more, and limiting alcohol. This information is not intended to replace advice given to you by your health care  provider. Make sure you discuss any questions you have with your health care provider. Document Released: 02/29/2012 Document Revised: 05/04/2016 Document Reviewed: 05/04/2016 Elsevier Interactive Patient Education  2018 [employer].   Chronic Kidney Disease, Adult Chronic kidney disease (CKD) happens when the kidneys are damaged during a time of 3 or more months. The kidneys are two organs that do many important jobs in the body. These jobs include:  Removing wastes and extra fluids from the blood.  Making hormones that maintain the amount of fluid in your tissues and blood vessels.  Making sure that the body has the right amount of fluids and chemicals.  Most of the time, this condition does not go away, but it can usually be controlled. Steps must be taken to slow down the kidney damage or stop it from getting worse. Otherwise, the kidneys may stop working. Follow these instructions at home:  Follow your diet as told by your doctor. You may need to avoid alcohol, salty foods (sodium), and foods that are high in potassium, calcium, and protein.  Take over-the-counter and prescription medicines only as told by your doctor. Do not take any new medicines unless your doctor says you can do that. These include vitamins and minerals. ? Medicines and nutritional supplements can make kidney damage worse. ? Your doctor may need to change how much medicine you take.  Do not use any tobacco products. These include cigarettes, chewing tobacco, and e-cigarettes. If you need help quitting, ask your doctor.  Keep all follow-up visits as told by your doctor. This is important.  Check your blood pressure. Tell your doctor if there are changes to your blood pressure.  Get to a healthy weight. Stay at that weight. If you need help with this, ask your doctor.  Start or continue an exercise plan. Try to exercise at least 30 minutes a day, 5 days a week.  Stay up-to-date with your shots  (immunizations) as told by your doctor. Contact a doctor if:  Your symptoms get worse.  You have new symptoms. Get help right away if:  You have symptoms of end-stage kidney disease. These  include: ? Headaches. ? Skin that is darker or lighter than normal. ? Numbness in your hands or feet. ? Easy bruising. ? Having hiccups often. ? Chest pain. ? Shortness of breath. ? Stopping of menstrual periods in women.  You have a fever.  You are making very little pee (urine).  You have pain or bleeding when you pee (urinate). This information is not intended to replace advice given to you by your health care provider. Make sure you discuss any questions you have with your health care provider. Document Released: 08/31/2009 Document Revised: 11/12/2015 Document Reviewed: 02/03/2012 Elsevier Interactive Patient Education  2017 [place] DASH stands for "Dietary Approaches to Stop Hypertension." The DASH eating plan is a healthy eating plan that has been shown to reduce high blood pressure (hypertension). It may also reduce your risk for type 2 diabetes, heart disease, and stroke. The DASH eating plan may also help with weight loss. What are tips for following this plan? General guidelines  Avoid eating more than 2,300 mg (milligrams) of salt (sodium) a day. If you have hypertension, you may need to reduce your sodium intake to 1,500 mg a day.  Limit alcohol intake to no more than 1 drink a day for nonpregnant women and 2 drinks a day for men. One drink equals 12 oz of beer, 5 oz of wine, or 1 oz of hard liquor.  Work with your health care provider to maintain a healthy body weight or to lose weight. Ask what an ideal weight is for you.  Get at least 30 minutes of exercise that causes your heart to beat faster (aerobic exercise) most days of the week. Activities may include walking, swimming, or biking.  Work with your health care provider or diet and nutrition  specialist (dietitian) to adjust your eating plan to your individual calorie needs. Reading food labels  Check food labels for the amount of sodium per serving. Choose foods with less than 5 percent of the Daily Value of sodium. Generally, foods with less than 300 mg of sodium per serving fit into this eating plan.  To find whole grains, look for the word "whole" as the first word in the ingredient list. Shopping  Buy products labeled as "low-sodium" or "no salt added."  Buy fresh foods. Avoid canned foods and premade or frozen meals. Cooking  Avoid adding salt when cooking. Use salt-free seasonings or herbs instead of table salt or sea salt. Check with your health care provider or pharmacist before using salt substitutes.  Do not fry foods. Cook foods using healthy methods such as baking, boiling, grilling, and broiling instead.  Cook with heart-healthy oils, such as olive, canola, soybean, or sunflower oil. Meal planning   Eat a balanced diet that includes: ? 5 or more servings of fruits and vegetables each day. At each meal, try to fill half of your plate with fruits and vegetables. ? Up to 6-8 servings of whole grains each day. ? Less than 6 oz of lean meat, poultry, or fish each day. A 3-oz serving of meat is about the same size as a deck of cards. One egg equals 1 oz. ? 2 servings of low-fat dairy each day. ? A serving of nuts, seeds, or beans 5 times each week. ? Heart-healthy fats. Healthy fats called Omega-3 fatty acids are found in foods such as flaxseeds and coldwater fish, like sardines, salmon, and mackerel.  Limit how much you eat of the following: ? Canned  or prepackaged foods. ? Food that is high in trans fat, such as fried foods. ? Food that is high in saturated fat, such as fatty meat. ? Sweets, desserts, sugary drinks, and other foods with added sugar. ? Full-fat dairy products.  Do not salt foods before eating.  Try to eat at least 2 vegetarian meals each  week.  Eat more home-cooked food and less restaurant, buffet, and fast food.  When eating at a restaurant, ask that your food be prepared with less salt or no salt, if possible. What foods are recommended? The items listed may not be a complete list. Talk with your dietitian about what dietary choices are best for you. Grains Whole-grain or whole-wheat bread. Whole-grain or whole-wheat pasta. Brown rice. [place]. Bulgur. Whole-grain and low-sodium cereals. Pita bread. Low-fat, low-sodium crackers. Whole-wheat flour tortillas. Vegetables Fresh or frozen vegetables (raw, steamed, roasted, or grilled). Low-sodium or reduced-sodium tomato and vegetable juice. Low-sodium or reduced-sodium tomato sauce and tomato paste. Low-sodium or reduced-sodium canned vegetables. Fruits All fresh, dried, or frozen fruit. Canned fruit in natural juice (without added sugar). Meat and other protein foods Skinless chicken or [place]. Ground chicken or [place]. Pork with fat trimmed off. Fish and seafood. Egg whites. Dried beans, peas, or lentils. Unsalted nuts, nut butters, and seeds. Unsalted canned beans. Lean cuts of beef with fat trimmed off. Low-sodium, lean deli meat. Dairy Low-fat (1%) or fat-free (skim) milk. Fat-free, low-fat, or reduced-fat cheeses. Nonfat, low-sodium ricotta or cottage cheese. Low-fat or nonfat yogurt. Low-fat, low-sodium cheese. Fats and oils Soft margarine without trans fats. Vegetable oil. Low-fat, reduced-fat, or light mayonnaise and salad dressings (reduced-sodium). Canola, safflower, olive, soybean, and sunflower oils. Avocado. Seasoning and other foods Herbs. Spices. Seasoning mixes without salt. Unsalted popcorn and pretzels. Fat-free sweets. What foods are not recommended? The items listed may not be a complete list. Talk with your dietitian about what dietary choices are best for you. Grains Baked goods made with fat, such as croissants, muffins, or some breads. Dry pasta  or rice meal packs. Vegetables Creamed or fried vegetables. Vegetables in a cheese sauce. Regular canned vegetables (not low-sodium or reduced-sodium). Regular canned tomato sauce and paste (not low-sodium or reduced-sodium). Regular tomato and vegetable juice (not low-sodium or reduced-sodium). Angie Fava. Olives. Fruits Canned fruit in a light or heavy syrup. Fried fruit. Fruit in cream or butter sauce. Meat and other protein foods Fatty cuts of meat. Ribs. Fried meat. Berniece Salines. Sausage. Bologna and other processed lunch meats. Salami. Fatback. Hotdogs. Bratwurst. Salted nuts and seeds. Canned beans with added salt. Canned or smoked fish. Whole eggs or egg yolks. Chicken or [place] with skin. Dairy Whole or 2% milk, cream, and half-and-half. Whole or full-fat cream cheese. Whole-fat or sweetened yogurt. Full-fat cheese. Nondairy creamers. Whipped toppings. Processed cheese and cheese spreads. Fats and oils Butter. Stick margarine. Lard. Shortening. Ghee. Bacon fat. Tropical oils, such as coconut, palm kernel, or palm oil. Seasoning and other foods Salted popcorn and pretzels. Onion salt, garlic salt, seasoned salt, table salt, and sea salt. Worcestershire sauce. Tartar sauce. Barbecue sauce. Teriyaki sauce. Soy sauce, including reduced-sodium. Steak sauce. Canned and packaged gravies. Fish sauce. Oyster sauce. Cocktail sauce. Horseradish that you find on the shelf. Ketchup. Mustard. Meat flavorings and tenderizers. Bouillon cubes. Hot sauce and Tabasco sauce. Premade or packaged marinades. Premade or packaged taco seasonings. Relishes. Regular salad dressings. Where to find more information:  National Heart, Lung, and [place]: https://wilson-eaton.com/  American Heart Association: www.heart.org Summary  The  DASH eating plan is a healthy eating plan that has been shown to reduce high blood pressure (hypertension). It may also reduce your risk for type 2 diabetes, heart disease, and stroke.  With the  DASH eating plan, you should limit salt (sodium) intake to 2,300 mg a day. If you have hypertension, you may need to reduce your sodium intake to 1,500 mg a day.  When on the DASH eating plan, aim to eat more fresh fruits and vegetables, whole grains, lean proteins, low-fat dairy, and heart-healthy fats.  Work with your health care provider or diet and nutrition specialist (dietitian) to adjust your eating plan to your individual calorie needs. This information is not intended to replace advice given to you by your health care provider. Make sure you discuss any questions you have with your health care provider. Document Released: 05/26/2011 Document Revised: 05/30/2016 Document Reviewed: 05/30/2016 Elsevier Interactive Patient Education  [employer].

## 2017-08-25 NOTE — Progress Notes (Signed)
Physical Therapy Treatment Patient Details Name: Peter Cox MRN: [id number] DOB: Apr 21, 1961 Today's Date: 08/25/2017    History of Present Illness Pt is a 57 y/o male admitted secondary to RUE twitching. Found to have hypertensive urgency. CT was negative for acute abnormality, however, revealed chronic infarcts, and chronic microvascular ischemic disease. PMH includes dementia, HIV, CVA, HTN, subastance abuse, and tobacco abuse.     PT Comments    Pt appears moderately unsteady, but performs standard testing better that initially appears.  That said, pt does show mild deviation with many balance challenges and does pose a risk of falling.   Follow Up Recommendations  Home health PT;Supervision/Assistance - 24 hour     Equipment Recommendations       Recommendations for Other Services       Precautions / Restrictions Precautions Precautions: Fall Restrictions Weight Bearing Restrictions: No    Mobility  Bed Mobility Overal bed mobility: Needs Assistance Bed Mobility: Supine to Sit;Sit to Supine     Supine to sit: Supervision Sit to supine: Supervision   General bed mobility comments: No overt safety issues  Transfers Overall transfer level: Needs assistance Equipment used: Rolling walker (2 wheeled) Transfers: Sit to/from Stand Sit to Stand: Min guard         General transfer comment: min guard for safety  Ambulation/Gait Ambulation/Gait assistance: Min guard Ambulation Distance (Feet): 350 Feet Assistive device: None Gait Pattern/deviations: Step-through pattern Gait velocity: able to change speed with mild deviation Gait velocity interpretation: at or above normal speed for age/gender General Gait Details: mildly unsteady.  pt able to increase/decrease speed to cuing with minor deviation.  Pt able to scan, turn moderately abruptly, back up and step over low obstacles with minor deviation.  No overt LOB, but deviation suggests having fall  risk.   Stairs Stairs: Yes   Stair Management: One rail Right;No rails;One rail Left;Alternating pattern;Forwards Number of Stairs: 5 General stair comments: safe with rail, not as confidence inspiring without rail  Wheelchair Mobility    Modified Rankin (Stroke Patients Only)       Balance Overall balance assessment: Needs assistance Sitting-balance support: No upper extremity supported;Feet supported Sitting balance-Leahy Scale: Fair Sitting balance - Comments: Losing balance posteriorly at times   Standing balance support: No upper extremity supported;During functional activity Standing balance-Leahy Scale: Fair Standing balance comment: Close min guard for safety during grooming tasks at sink                            Cognition Arousal/Alertness: Awake/alert Behavior During Therapy: [facility] for tasks assessed/performed;Impulsive Overall Cognitive Status: No family/caregiver present to determine baseline cognitive functioning Area of Impairment: Following commands;Safety/judgement;Problem solving                       Following Commands: Follows one step commands inconsistently Safety/Judgement: Decreased awareness of safety;Decreased awareness of deficits   Problem Solving: Slow processing;Decreased initiation;Difficulty sequencing;Requires verbal cues General Comments: Followed instruction well "on the fly"      Exercises      General Comments        Pertinent Vitals/Pain Pain Assessment: No/denies pain    Home Living Family/patient expects to be discharged to:: Private residence Living Arrangements: Other relatives(son) Available Help at Discharge: Family;Available PRN/intermittently Type of Home: Apartment Home Access: Stairs to enter Entrance Stairs-Rails: Can reach both Home Layout: One level;Full bath on main level Home Equipment: None Additional Comments: Pt reports  he lives with son, RN reporting sister planning to take him  home with her. Pt seems to be a poor historian, unsure of accuracy of information provided.    Prior Function Level of Independence: Independent          PT Goals (current goals can now be found in the care plan section) Acute Rehab PT Goals Patient Stated Goal: to go home  PT Goal Formulation: With patient Time For Goal Achievement: 09/07/17 Potential to Achieve Goals: Good Progress towards PT goals: Progressing toward goals    Frequency    Min 3X/week      PT Plan Current plan remains appropriate    Co-evaluation              AM-PAC PT "6 Clicks" Daily Activity  Outcome Measure  Difficulty turning over in bed (including adjusting bedclothes, sheets and blankets)?: None Difficulty moving from lying on back to sitting on the side of the bed? : None Difficulty sitting down on and standing up from a chair with arms (e.g., wheelchair, bedside commode, etc,.)?: None Help needed moving to and from a bed to chair (including a wheelchair)?: A Little Help needed walking in hospital room?: A Little Help needed climbing 3-5 steps with a railing? : A Little 6 Click Score: 21    End of Session   Activity Tolerance: Patient tolerated treatment well Patient left: in bed;with call bell/phone within reach;with bed alarm set Nurse Communication: Mobility status PT Visit Diagnosis: Unsteadiness on feet (R26.81);Other abnormalities of gait and mobility (R26.89)     Time: 2536-6440 PT Time Calculation (min) (ACUTE ONLY): 10 min  Charges:  $Gait Training: 8-22 mins                    G Codes:       09-21-2017  Donnella Sham, PT [phone]  (pager)   [name] 2017/09/21, 5:40 PM
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
## 2017-08-25 NOTE — Discharge Summary (Signed)
Discharge Summary  Peter Cox OVZ:[id number] DOB: 12-28-1960  PCP: Patient, No Pcp Per  Admit date: 08/22/2017 Discharge date: 08/25/2017  Time spent: 25 minutes  Recommendations for Outpatient Follow-up:  1. Follow-up with infectious disease clinic to restart your medications 2. Continue physical therapy at home 3. Fall precaution 4. Take your medications as prescribed  Discharge Diagnoses:  Active Hospital Problems   Diagnosis Date Noted  . Hypertensive urgency 08/23/2017  . Cocaine abuse ([place]) 08/23/2017  . Acute kidney injury superimposed on CKD ([place]) 08/23/2017  . Hypertensive emergency 08/23/2017  . 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 Human immunodeficiency virus (HIV) disease ([place]) 08/17/2008  . Dementia, multiinfarct 08/08/2008    Resolved Hospital Problems  No resolved problems to display.    Discharge Condition: Stable  Diet recommendation: Resume previous diet  Vitals:   08/25/17 0830 08/25/17 1112  BP:  (!) 165/95  Pulse:  65  Resp:  18  Temp:  98 F (36.7 C)  SpO2: 96% 97%    History of present illness:  The patient is a 57 year old male with past medical history significant for HIV polysubstance abuse including alcohol, marijuana, and cocaine abuse from home who presents to the ED with complaints of right arm twitching which could voluntarily control.Upon presentation patient was found to be in hypertensive emergency with blood pressure in the 230s with AK I on CKD 3.  08/23/2017 patient was seen and examined in the emergency room.He is somnolent and minimally interactive. States he had without contrast revealed chronic microvascular ischemic disease chronic right basal ganglia and bilateral internal capsular lacunar infarcts with no acute intracranial abnormality.Patient is admitted for hypertensive emergency with a chaotic in the setting of suspected medication noncompliance and cocaine abuse.UDS  positive for cocaine and THC (08/22/17).  08/24/2017: Patient is more alert today.  He reports feeling depressed with no suicidal or homicidal ideation.  Started on Lexapro.  Patient admits he has not taken his HIV medications in more than a year.  He will follow-up with infectious disease clinic after discharge.  08/25/2017: No new complaints.  PT evaluate who recommended home health PT.  Patient counseled on the importance of following up with infectious disease to restart his HIV medications.  Patient will call the clinic.  Patient understands and agrees to plan.  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 ([facility])   Cocaine abuse ([place])   Acute kidney injury superimposed on CKD ([place])   Hypertensive emergency  Hypertensive emergency -Blood pressure in the 200 on presentation -Noncompliance with medications -Acute renal injury -Started on clonidine and amlodipine -Blood pressure is improving  A KI on CKD 3 -Baseline creatinine 1.3 -Creatinine 2.02 -Avoid uncontrolled hypertension -Avoid nephrotoxic agents -Avoid dehydration -Gentle IV fluid hydration -Encourage increase of oral intake  Polysubstance abuse including cocaine and THC -Cessation counseling done at bedside  HIV positive -Stopped taking his HIV medications more than 1 year ago -Patient advised to follow-up with the infectious disease clinic after discharge -Will restart HIV medicationS in the outpatient setting if agreeable -CD4 340, CD4 percent helper T cells 28  Chronic normocytic anemia -No sign of overt bleeding -Hemoglobin 10.5  Chronic microvascular ischemic disease/chronic right basal ganglial and bilateral internal capsule lacunar infarcts -Noted -Physical therapy to evaluate -Fall precautions  Depression/anxiety -Lexapro  started today -No  suicidal or homicidal ideation  Hyperlipidemia -Lipitor   Procedures:  None  Consultations:  None  Discharge Exam: BP (!) 165/95 (BP Location: Left Arm)   Pulse 65   Temp 98 F (36.7 C) (Oral)   Resp 18   Ht 5\' 5"  (1.651 m)   Wt 58.2 kg (128 lb 4.9 oz)   SpO2 97%   BMI 21.35 kg/m   General: 57 year old after American male thin in no acute distress Cardiovascular: Regular rate and rhythm with no rubs or gallops Respiratory: Clear to auscultation no wheezes or rales  Discharge Instructions You were cared for by a hospitalist during your hospital stay. If you have any questions about your discharge medications or the care you received while you were in the hospital after you are discharged, you can call the unit and asked to speak with the hospitalist on call if the hospitalist that took care of you is not available. Once you are discharged, your primary care physician will handle any further medical issues. Please note that NO REFILLS for any discharge medications will be authorized once you are discharged, as it is imperative that you return to your primary care physician (or establish a relationship with a primary care physician if you do not have one) for your aftercare needs so that they can reassess your need for medications and monitor your lab values.   Allergies as of 08/25/2017   No Known Allergies     Medication List    STOP taking these medications   lisinopril 20 MG tablet Commonly known as:  PRINIVIL,ZESTRIL   pravastatin 40 MG tablet Commonly known as:  PRAVACHOL     TAKE these medications   amLODipine 10 MG tablet Commonly known as:  NORVASC Take 1 tablet (10 mg total) by mouth daily. Start taking on:  08/26/2017   aspirin 81 MG tablet Take 1 tablet (81 mg total) by mouth daily.   atorvastatin 20 MG tablet Commonly known as:  LIPITOR Take 1 tablet (20 mg total) by mouth daily at 6 PM.   cloNIDine 0.3 MG tablet Commonly known  as:  CATAPRES Take 1 tablet (0.3 mg total) by mouth 3 (three) times daily.   darunavir-cobicistat 800-150 MG tablet Commonly known as:  PREZCOBIX Take 1 tablet by mouth daily.   emtricitabine-tenofovir AF 200-25 MG tablet Commonly known as:  DESCOVY Take 1 tablet by mouth daily.   escitalopram 10 MG tablet Commonly known as:  LEXAPRO Take 1 tablet (10 mg total) by mouth daily.   feeding supplement (ENSURE ENLIVE) Liqd Take 237 mLs by mouth 2 (two) times daily between meals. Start taking on:  06/22/2438   folic acid 1 MG tablet Commonly known as:  FOLVITE Take 1 tablet (1 mg total) by mouth daily. Start taking on:  08/26/2017   multivitamin with minerals Tabs tablet Take 1 tablet by mouth daily. Start taking on:  08/26/2017   thiamine 100 MG tablet Take 1 tablet (100 mg total) by mouth daily. Start taking on:  08/26/2017      No Known Allergies Follow-up Information    Comer, Okey Regal, MD Follow up in 3 day(s).   Specialty:  Infectious Diseases Why:  please call the office on 08/28/17 to schedule an appointment to restart your medications. Contact information: [address] [place] [address] [phone]        [facility] Follow up in 1 week(s).   Contact information: [place]  [place] [id number] [phone]           The results of significant diagnostics from this hospitalization (including imaging, microbiology, ancillary and laboratory) are listed below for reference.    Significant Diagnostic Studies: Ct Head Wo Contrast  Result Date: 08/22/2017 CLINICAL DATA:  Involuntary twitching of the right arm. EXAM: CT HEAD WITHOUT CONTRAST TECHNIQUE: Contiguous axial images were obtained from the base of the skull through the vertex without intravenous contrast. COMPARISON:  None. FINDINGS: Brain: Chronic right basal ganglial and bilateral anterior limb of internal capsule lacunar infarcts with  chronic mild to moderate small vessel ischemic disease of periventricular white matter. No acute intracranial hemorrhage, midline shift or edema. No hydrocephalus. No intra-axial mass nor extra-axial fluid collections. Midline fourth ventricle and basal cisterns. Vascular: No hyperdense vessel sign.  No unexpected calcifications. Skull: No acute osseous abnormality. Sinuses/Orbits: Clear mastoids and paranasal sinuses. Intact orbits. Other: None. IMPRESSION: 1. Chronic microvascular ischemic disease. 2. Chronic right basal ganglial and bilateral internal capsular lacunar infarcts. 3. No acute intracranial abnormality. Electronically Signed   By: Ashley Royalty M.D.   On: 08/22/2017 21:20    Microbiology: Recent Results (from the past 240 hour(s))  MRSA PCR Screening     Status: None   Collection Time: 08/23/17  4:12 PM  Result Value Ref Range Status   MRSA by PCR NEGATIVE NEGATIVE Final    Comment:        The GeneXpert MRSA Assay (FDA approved for NASAL specimens only), is one component of a comprehensive MRSA colonization surveillance program. It is not intended to diagnose MRSA infection nor to guide or monitor treatment for MRSA infections. Performed at [facility], [place] [address], [place], [place] [address]      Labs: Basic Metabolic Panel: Recent Labs  Lab 08/22/17 2030 08/23/17 0208 08/24/17 1550  NA 140 140 136  K 4.0 3.7 3.9  CL 107 107 105  CO2 28 26 23   GLUCOSE 71 86 110*  BUN 22* 21* 37*  CREATININE 2.06* 2.02* 2.54*  CALCIUM 8.6* 8.6* 8.4*   Liver Function Tests: Recent Labs  Lab 08/22/17 2030  AST 22  ALT 15*  ALKPHOS 102  BILITOT 0.6  PROT 7.6  ALBUMIN 3.0*   No results for input(s): LIPASE, AMYLASE in the last 168 hours. No results for input(s): AMMONIA in the last 168 hours. CBC: Recent Labs  Lab 08/22/17 2030 08/24/17 1550  WBC 3.7* 4.0  NEUTROABS 2.1  --   HGB 11.3* 10.5*  HCT 34.0* 31.4*  MCV 87.0 87.7  PLT 120* 114*   Cardiac  Enzymes: No results for input(s): CKTOTAL, CKMB, CKMBINDEX, TROPONINI in the last 168 hours. BNP: BNP (last 3 results) No results for input(s): BNP in the last 8760 hours.  ProBNP (last 3 results) No results for input(s): PROBNP in the last 8760 hours.  CBG: Recent Labs  Lab 08/24/17 1738 08/24/17 2123 08/25/17 0736 08/25/17 1111  GLUCAP 93 107* 93 96       Signed:  Kayleen Memos, MD [facility] 08/25/2017, 3:15 PM

## 2017-08-25 NOTE — Progress Notes (Signed)
Creatinine 2.23. Pt being discharged per MD [facility].

## 2017-09-21 NOTE — Addendum Note (Signed)
Addended by: Janyce Llanos F on: 09/21/2017 11:07 AM   Modules accepted: Orders

## 2017-09-27 ENCOUNTER — Other Ambulatory Visit: Payer: Medicaid Other

## 2017-10-17 ENCOUNTER — Ambulatory Visit: Payer: Medicaid Other | Admitting: Infectious Disease

## 2017-11-16 DIAGNOSIS — S40012A Contusion of left shoulder, initial encounter: Secondary | ICD-10-CM | POA: Diagnosis not present

## 2017-11-16 DIAGNOSIS — R9431 Abnormal electrocardiogram [ECG] [EKG]: Secondary | ICD-10-CM | POA: Diagnosis not present

## 2017-11-16 DIAGNOSIS — S40212A Abrasion of left shoulder, initial encounter: Secondary | ICD-10-CM | POA: Diagnosis not present

## 2017-11-16 DIAGNOSIS — Y92410 Unspecified street and highway as the place of occurrence of the external cause: Secondary | ICD-10-CM | POA: Diagnosis not present

## 2017-11-16 DIAGNOSIS — S80211A Abrasion, right knee, initial encounter: Secondary | ICD-10-CM | POA: Diagnosis not present

## 2017-11-16 DIAGNOSIS — R000 Tachycardia, unspecified: Secondary | ICD-10-CM | POA: Diagnosis not present

## 2017-11-16 DIAGNOSIS — S50312A Abrasion of left elbow, initial encounter: Secondary | ICD-10-CM | POA: Diagnosis not present

## 2017-11-16 DIAGNOSIS — S8991XA Unspecified injury of right lower leg, initial encounter: Secondary | ICD-10-CM | POA: Diagnosis not present

## 2017-11-16 DIAGNOSIS — W19XXXA Unspecified fall, initial encounter: Secondary | ICD-10-CM | POA: Diagnosis not present

## 2017-11-16 DIAGNOSIS — S42401A Unspecified fracture of lower end of right humerus, initial encounter for closed fracture: Secondary | ICD-10-CM | POA: Diagnosis not present

## 2017-11-16 DIAGNOSIS — F17210 Nicotine dependence, cigarettes, uncomplicated: Secondary | ICD-10-CM | POA: Diagnosis not present

## 2017-11-16 DIAGNOSIS — M79631 Pain in right forearm: Secondary | ICD-10-CM | POA: Diagnosis not present

## 2017-11-16 DIAGNOSIS — S52501A Unspecified fracture of the lower end of right radius, initial encounter for closed fracture: Secondary | ICD-10-CM | POA: Diagnosis not present

## 2017-11-16 DIAGNOSIS — S60512A Abrasion of left hand, initial encounter: Secondary | ICD-10-CM | POA: Diagnosis not present

## 2017-11-16 DIAGNOSIS — I10 Essential (primary) hypertension: Secondary | ICD-10-CM | POA: Diagnosis not present

## 2017-11-16 DIAGNOSIS — S42032A Displaced fracture of lateral end of left clavicle, initial encounter for closed fracture: Secondary | ICD-10-CM | POA: Diagnosis not present

## 2017-11-16 DIAGNOSIS — Y999 Unspecified external cause status: Secondary | ICD-10-CM | POA: Diagnosis not present

## 2017-11-16 DIAGNOSIS — M7989 Other specified soft tissue disorders: Secondary | ICD-10-CM | POA: Diagnosis not present

## 2017-11-16 DIAGNOSIS — M25561 Pain in right knee: Secondary | ICD-10-CM | POA: Diagnosis not present

## 2017-11-16 DIAGNOSIS — I517 Cardiomegaly: Secondary | ICD-10-CM | POA: Diagnosis not present

## 2017-11-16 DIAGNOSIS — S52124A Nondisplaced fracture of head of right radius, initial encounter for closed fracture: Secondary | ICD-10-CM | POA: Diagnosis not present

## 2017-11-16 DIAGNOSIS — S60511A Abrasion of right hand, initial encounter: Secondary | ICD-10-CM | POA: Diagnosis not present

## 2017-11-22 DIAGNOSIS — S52135A Nondisplaced fracture of neck of left radius, initial encounter for closed fracture: Secondary | ICD-10-CM | POA: Diagnosis not present

## 2017-11-22 DIAGNOSIS — S42032A Displaced fracture of lateral end of left clavicle, initial encounter for closed fracture: Secondary | ICD-10-CM | POA: Diagnosis not present

## 2017-11-23 ENCOUNTER — Ambulatory Visit (INDEPENDENT_AMBULATORY_CARE_PROVIDER_SITE_OTHER): Payer: Medicare Other | Admitting: Internal Medicine

## 2017-11-23 ENCOUNTER — Encounter: Payer: Self-pay | Admitting: Internal Medicine

## 2017-11-23 VITALS — BP 172/100 | HR 91 | Temp 97.9°F | Wt 124.0 lb

## 2017-11-23 DIAGNOSIS — I10 Essential (primary) hypertension: Secondary | ICD-10-CM

## 2017-11-23 MED ORDER — LABETALOL HCL 100 MG PO TABS: 100.0000 mg | ORAL_TABLET | Freq: Two times a day (BID) | ORAL | 0 refills | Status: DC

## 2017-11-23 MED ORDER — AMLODIPINE BESYLATE 10 MG PO TABS: 10.0000 mg | ORAL_TABLET | Freq: Every day | ORAL | 0 refills | Status: DC

## 2017-11-23 NOTE — Patient Instructions (Signed)

## 2017-11-24 ENCOUNTER — Ambulatory Visit: Payer: Medicaid Other | Admitting: Infectious Disease

## 2017-11-29 DIAGNOSIS — Z113 Encounter for screening for infections with a predominantly sexual mode of transmission: Secondary | ICD-10-CM | POA: Diagnosis not present

## 2017-11-29 DIAGNOSIS — F17210 Nicotine dependence, cigarettes, uncomplicated: Secondary | ICD-10-CM | POA: Diagnosis not present

## 2017-11-29 DIAGNOSIS — Z79899 Other long term (current) drug therapy: Secondary | ICD-10-CM | POA: Diagnosis not present

## 2017-11-29 DIAGNOSIS — F0150 Vascular dementia without behavioral disturbance: Secondary | ICD-10-CM | POA: Diagnosis not present

## 2017-11-29 DIAGNOSIS — Z1159 Encounter for screening for other viral diseases: Secondary | ICD-10-CM | POA: Diagnosis not present

## 2017-11-29 DIAGNOSIS — I10 Essential (primary) hypertension: Secondary | ICD-10-CM | POA: Diagnosis not present

## 2017-11-29 DIAGNOSIS — B20 Human immunodeficiency virus [HIV] disease: Secondary | ICD-10-CM | POA: Diagnosis not present

## 2017-11-30 ENCOUNTER — Encounter: Payer: Self-pay | Admitting: Internal Medicine

## 2017-11-30 NOTE — Progress Notes (Signed)
HPI  Pt presents to the clinic today for medication refills. He has a history of uncontrolled HTN.  He is prescribed Amlodipine and Clonidine and reports he has been taking them for the last week as prescribed. His BP today is 172/100. He denies headaches, visual changes, chest pain or shortness of breath. ECG from 08/2017 reviewed.  Past Medical History:  Diagnosis Date  . Alcohol abuse    heavy, clean since April  . Alcohol abuse 11/24/2014  . Cocaine abuse ([place])     clean since about 2000  . Drug abuse ([place]) 11/24/2014  . HIV infection ([place])    dx 08/08/2008 (unknown how he was exposed)  Initial VL 43,200 and CD4 410 on 08/28/08  . Hyperlipidemia   . Hypertension   . Marijuana abuse   . Mild cognitive impairment    likely vascular dementia/psa/?HIV componentCT Head 02/12/08 for htn crisis and concern for stroke: impression-1. Old right internal capsule and thalamic lacunar infarcts. 2. Mild to moderate chronic small vessell white matter ischemic changes in both cerebral hemisphere, greater on the right  . Neuromuscular disorder ([place])   . Seizures ([place])    "last year had seizure"  . Stroke ([place])   . Tobacco abuse     Current Outpatient Medications  Medication Sig Dispense Refill  . amLODipine (NORVASC) 10 MG tablet Take 1 tablet (10 mg total) by mouth daily. 30 tablet 0  . aspirin 81 MG tablet Take 1 tablet (81 mg total) by mouth daily. (Patient not taking: Reported on 11/23/2017) 30 tablet 0  . atorvastatin (LIPITOR) 20 MG tablet Take 1 tablet (20 mg total) by mouth daily at 6 PM. (Patient not taking: Reported on 11/23/2017) 30 tablet 0  . cloNIDine (CATAPRES) 0.3 MG tablet Take 1 tablet (0.3 mg total) by mouth 3 (three) times daily. (Patient not taking: Reported on 11/23/2017) 90 tablet 0  . darunavir-cobicistat (PREZCOBIX) 800-150 MG tablet Take 1 tablet by mouth daily. (Patient not taking: Reported on 11/23/2017) 30 tablet 11  . emtricitabine-tenofovir AF (DESCOVY) 200-25 MG tablet Take 1  tablet by mouth daily. (Patient not taking: Reported on 11/23/2017) 30 tablet 11  . escitalopram (LEXAPRO) 10 MG tablet Take 1 tablet (10 mg total) by mouth daily. (Patient not taking: Reported on 11/23/2017) 30 tablet 0  . feeding supplement, ENSURE ENLIVE, (ENSURE ENLIVE) LIQD Take 237 mLs by mouth 2 (two) times daily between meals. (Patient not taking: Reported on 02/20/2354) 732 mL 0  . folic acid (FOLVITE) 1 MG tablet Take 1 tablet (1 mg total) by mouth daily. (Patient not taking: Reported on 11/23/2017) 30 tablet 0  . labetalol (NORMODYNE) 100 MG tablet Take 1 tablet (100 mg total) by mouth 2 (two) times daily. 60 tablet 0  . Multiple Vitamin (MULTIVITAMIN WITH MINERALS) TABS tablet Take 1 tablet by mouth daily. (Patient not taking: Reported on 11/23/2017) 30 tablet 0  . thiamine 100 MG tablet Take 1 tablet (100 mg total) by mouth daily. (Patient not taking: Reported on 11/23/2017) 30 tablet 0   No current facility-administered medications for this visit.     No Known Allergies  Family History  Problem Relation Age of Onset  . Diabetes Mother   . Hypertension Mother   . Stroke Mother   . Diabetes Father   . Hypertension Sister   . Hypertension Sister   . Colon cancer Neg Hx     Social History   Socioeconomic History  . Marital status: Single    Spouse name:  Not on file  . Number of children: Not on file  . Years of education: Not on file  . Highest education level: Not on file  Occupational History  . Occupation: Unemployed  Social Needs  . Financial resource strain: Not on file  . Food insecurity:    Worry: Not on file    Inability: Not on file  . Transportation needs:    Medical: Not on file    Non-medical: Not on file  Tobacco Use  . Smoking status: Current Every Day Smoker    Packs/day: 0.50    Years: 40.00    Pack years: 20.00    Types: Cigarettes  . Smokeless tobacco: Never Used  . Tobacco comment: Quit  x 2 week.  Substance and Sexual Activity  . Alcohol use: Yes     Alcohol/week: 0.0 oz    Comment: drink beer  . Drug use: Yes    Frequency: 7.0 times per week    Types: Marijuana    Comment: use Marijuana every day per pt.  . Sexual activity: Not Currently    Birth control/protection: Condom    Comment: given 2 bags of condoms  Lifestyle  . Physical activity:    Days per week: Not on file    Minutes per session: Not on file  . Stress: Not on file  Relationships  . Social connections:    Talks on phone: Not on file    Gets together: Not on file    Attends religious service: Not on file    Active member of club or organization: Not on file    Attends meetings of clubs or organizations: Not on file    Relationship status: Not on file  . Intimate partner violence:    Fear of current or ex partner: Not on file    Emotionally abused: Not on file    Physically abused: Not on file    Forced sexual activity: Not on file  Other Topics Concern  . Not on file  Social History Narrative   Lives alone in [place].  Male friend [name]) and her family assist the patient with home management.  They have been friends x 33yrs but are no longer sexually active.  Not working. 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    ROS:  Constitutional: Denies fever, malaise, fatigue, headache or abrupt weight changes.  Respiratory: Denies difficulty breathing, shortness of breath, cough or sputum production.   Cardiovascular: Denies chest pain, chest tightness, palpitations or swelling in the hands or feet.    No other specific complaints in a complete review of systems (except as listed in HPI above).  PE:  BP (!) 172/100   Pulse 91   Temp 97.9 F (36.6 C) (Oral)   Wt 124 lb (56.2 kg)   SpO2 98%   BMI 20.63 kg/m  Wt Readings from Last 3 Encounters:  11/23/17 124 lb (56.2 kg)  08/25/17 128 lb 4.9 oz (58.2 kg)  09/10/15 128 lb (58.1 kg)    General: Appears his stated age, chronically ill  appearing, in NAD. Skin: Dry and intact. Cardiovascular: Normal rate and rhythm. S1,S2 noted.  No murmur, rubs or gallops noted. Pulmonary/Chest: Normal effort and positive vesicular breath sounds. No respiratory distress. No wheezes, rales or ronchi noted.  Neurological: Alert and oriented.  Psychiatric: Mood and affect normal. Behavior is normal. Judgment and thought content normal.    BMET  Component Value Date/Time   NA 136 08/24/2017 1550   K 3.9 08/24/2017 1550   CL 105 08/24/2017 1550   CO2 23 08/24/2017 1550   GLUCOSE 110 (H) 08/24/2017 1550   BUN 37 (H) 08/24/2017 1550   CREATININE 2.23 (H) 08/25/2017 1528   CREATININE 1.38 (H) 08/31/2015 1637   CALCIUM 8.4 (L) 08/24/2017 1550   GFRNONAA 31 (L) 08/25/2017 1528   GFRNONAA 57 (L) 08/31/2015 1637   GFRAA 36 (L) 08/25/2017 1528   GFRAA 66 08/31/2015 1637    Lipid Panel     Component Value Date/Time   CHOL 176 08/31/2015 1637   TRIG 173 (H) 08/31/2015 1637   HDL 48 08/31/2015 1637   CHOLHDL 3.7 08/31/2015 1637   VLDL 35 (H) 08/31/2015 1637   LDLCALC 93 08/31/2015 1637    CBC    Component Value Date/Time   WBC 4.0 08/24/2017 1550   RBC 3.58 (L) 08/24/2017 1550   HGB 10.5 (L) 08/24/2017 1550   HCT 31.4 (L) 08/24/2017 1550   PLT 114 (L) 08/24/2017 1550   MCV 87.7 08/24/2017 1550   MCH 29.3 08/24/2017 1550   MCHC 33.4 08/24/2017 1550   RDW 15.1 08/24/2017 1550   LYMPHSABS 1.2 08/22/2017 2030   MONOABS 0.2 08/22/2017 2030   EOSABS 0.2 08/22/2017 2030   BASOSABS 0.0 08/22/2017 2030    Hgb A1C Lab Results  Component Value Date   HGBA1C 4.5 (L) 08/24/2017     Assessment and Plan:  HTN:  Continue Amlodipine and Clonidine eRx for Labetalol 100 mg BID # 60  RTC in 2 weeks for follow up HTN/new pt appt Webb Silversmith, NP

## 2017-12-08 ENCOUNTER — Ambulatory Visit: Payer: Medicaid Other | Admitting: Internal Medicine

## 2017-12-08 NOTE — Progress Notes (Deleted)
HPI  Pt presents to the clinic today to establish care and for management of the conditions listed below.  HIV/AID's: His last CD$ count ws 500, 2017. He is taking Prezcobiq and Descocy as prescribed.  HLD: His last LDL was 93, 2017. He is taking Atorvastatin as prescribed. He denies myalgias. He does not consume a low fat diet.  HTN: His BP today is. He is taking Amlodipine and Labetalol as prescribed. ECG from 08/2017 reviewed.  Dementia: Mild cognitive and functional needs. No meds.  Seizure: No recent seizure off meds.  Neuromuscular Disorder:  History of Stroke: With residual. He is taking ASA, Amlodipine, Atorvastatin and Labetalol as prescribed.   Flu: Tetanus: 02/2013 Pneumovax: 08/2008 PSA Screening: Colon Screening:  06/2014 Vision Screening: Dentist:  Past Medical History:  Diagnosis Date  . Alcohol abuse    heavy, clean since April  . Alcohol abuse 11/24/2014  . Cocaine abuse ([place])     clean since about 2000  . Drug abuse ([place]) 11/24/2014  . HIV infection ([place])    dx 08/08/2008 (unknown how he was exposed)  Initial VL 43,200 and CD4 410 on 08/28/08  . Hyperlipidemia   . Hypertension   . Marijuana abuse   . Mild cognitive impairment    likely vascular dementia/psa/?HIV componentCT Head 02/12/08 for htn crisis and concern for stroke: impression-1. Old right internal capsule and thalamic lacunar infarcts. 2. Mild to moderate chronic small vessell white matter ischemic changes in both cerebral hemisphere, greater on the right  . Neuromuscular disorder ([place])   . Seizures ([place])    "last year had seizure"  . Stroke ([place])   . Tobacco abuse     Current Outpatient Medications  Medication Sig Dispense Refill  . amLODipine (NORVASC) 10 MG tablet Take 1 tablet (10 mg total) by mouth daily. 30 tablet 0  . aspirin 81 MG tablet Take 1 tablet (81 mg total) by mouth daily. (Patient not taking: Reported on 11/23/2017) 30 tablet 0  . atorvastatin (LIPITOR) 20 MG tablet Take 1 tablet  (20 mg total) by mouth daily at 6 PM. (Patient not taking: Reported on 11/23/2017) 30 tablet 0  . cloNIDine (CATAPRES) 0.3 MG tablet Take 1 tablet (0.3 mg total) by mouth 3 (three) times daily. (Patient not taking: Reported on 11/23/2017) 90 tablet 0  . darunavir-cobicistat (PREZCOBIX) 800-150 MG tablet Take 1 tablet by mouth daily. (Patient not taking: Reported on 11/23/2017) 30 tablet 11  . emtricitabine-tenofovir AF (DESCOVY) 200-25 MG tablet Take 1 tablet by mouth daily. (Patient not taking: Reported on 11/23/2017) 30 tablet 11  . escitalopram (LEXAPRO) 10 MG tablet Take 1 tablet (10 mg total) by mouth daily. (Patient not taking: Reported on 11/23/2017) 30 tablet 0  . feeding supplement, ENSURE ENLIVE, (ENSURE ENLIVE) LIQD Take 237 mLs by mouth 2 (two) times daily between meals. (Patient not taking: Reported on 08/20/2949) 884 mL 0  . folic acid (FOLVITE) 1 MG tablet Take 1 tablet (1 mg total) by mouth daily. (Patient not taking: Reported on 11/23/2017) 30 tablet 0  . labetalol (NORMODYNE) 100 MG tablet Take 1 tablet (100 mg total) by mouth 2 (two) times daily. 60 tablet 0  . Multiple Vitamin (MULTIVITAMIN WITH MINERALS) TABS tablet Take 1 tablet by mouth daily. (Patient not taking: Reported on 11/23/2017) 30 tablet 0  . thiamine 100 MG tablet Take 1 tablet (100 mg total) by mouth daily. (Patient not taking: Reported on 11/23/2017) 30 tablet 0   No current facility-administered medications for this visit.  No Known Allergies  Family History  Problem Relation Age of Onset  . Diabetes Mother   . Hypertension Mother   . Stroke Mother   . Diabetes Father   . Hypertension Sister   . Hypertension Sister   . Colon cancer Neg Hx     Social History   Socioeconomic History  . Marital status: Single    Spouse name: Not on file  . Number of children: Not on file  . Years of education: Not on file  . Highest education level: Not on file  Occupational History  . Occupation: Unemployed  Social Needs  .  Financial resource strain: Not on file  . Food insecurity:    Worry: Not on file    Inability: Not on file  . Transportation needs:    Medical: Not on file    Non-medical: Not on file  Tobacco Use  . Smoking status: Current Every Day Smoker    Packs/day: 0.50    Years: 40.00    Pack years: 20.00    Types: Cigarettes  . Smokeless tobacco: Never Used  . Tobacco comment: Quit  x 2 week.  Substance and Sexual Activity  . Alcohol use: Yes    Alcohol/week: 0.0 oz    Comment: drink beer  . Drug use: Yes    Frequency: 7.0 times per week    Types: Marijuana    Comment: use Marijuana every day per pt.  . Sexual activity: Not Currently    Birth control/protection: Condom    Comment: given 2 bags of condoms  Lifestyle  . Physical activity:    Days per week: Not on file    Minutes per session: Not on file  . Stress: Not on file  Relationships  . Social connections:    Talks on phone: Not on file    Gets together: Not on file    Attends religious service: Not on file    Active member of club or organization: Not on file    Attends meetings of clubs or organizations: Not on file    Relationship status: Not on file  . Intimate partner violence:    Fear of current or ex partner: Not on file    Emotionally abused: Not on file    Physically abused: Not on file    Forced sexual activity: Not on file  Other Topics Concern  . Not on file  Social History Narrative   Lives alone in [place].  Male friend [name]) and her family assist the patient with home management.  They have been friends x 64yrs but are no longer sexually active.  Not working. 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    ROS:  Constitutional: Denies fever, malaise, fatigue, headache or abrupt weight changes.  HEENT: Denies eye pain, eye redness, ear pain, ringing in the ears, wax buildup, runny nose, nasal congestion, bloody nose, or sore throat. Respiratory:  Denies difficulty breathing, shortness of breath, cough or sputum production.   Cardiovascular: Denies chest pain, chest tightness, palpitations or swelling in the hands or feet.  Gastrointestinal: Denies abdominal pain, bloating, constipation, diarrhea or blood in the stool.  GU: Denies frequency, urgency, pain with urination, blood in urine, odor or discharge. Musculoskeletal: Denies decrease in range of motion, difficulty with gait, muscle pain or joint pain and swelling.  Skin: Denies redness, rashes, lesions or ulcercations.  Neurological: Denies dizziness, difficulty with memory, difficulty  with speech or problems with balance and coordination.  Psych: Denies anxiety, depression, SI/HI.  No other specific complaints in a complete review of systems (except as listed in HPI above).  PE:  There were no vitals taken for this visit. Wt Readings from Last 3 Encounters:  11/23/17 124 lb (56.2 kg)  08/25/17 128 lb 4.9 oz (58.2 kg)  09/10/15 128 lb (58.1 kg)    General: Appears their stated age, well developed, well nourished in NAD. HEENT: Head: normal shape and size; Eyes: sclera white, no icterus, conjunctiva pink, PERRLA and EOMs intact; Ears: Tm's gray and intact, normal light reflex;Throat/Mouth: Teeth present, mucosa pink and moist, no lesions or ulcerations noted.  Neck: Neck supple, trachea midline. No masses, lumps or thyromegaly present.  Cardiovascular: Normal rate and rhythm. S1,S2 noted.  No murmur, rubs or gallops noted. No JVD or BLE edema. No carotid bruits noted. Pulmonary/Chest: Normal effort and positive vesicular breath sounds. No respiratory distress. No wheezes, rales or ronchi noted.  Abdomen: Soft and nontender. Normal bowel sounds, no bruits noted. No distention or masses noted. Liver, spleen and kidneys non palpable. Musculoskeletal: Normal range of motion. Strength 5/5 BUE/BLE. No signs of joint swelling. No difficulty with gait.  Neurological: Alert and oriented.  Cranial nerves II-XII grossly intact. Coordination normal.  Psychiatric: Mood and affect normal. Behavior is normal. Judgment and thought content normal.   EKG:  BMET    Component Value Date/Time   NA 136 08/24/2017 1550   K 3.9 08/24/2017 1550   CL 105 08/24/2017 1550   CO2 23 08/24/2017 1550   GLUCOSE 110 (H) 08/24/2017 1550   BUN 37 (H) 08/24/2017 1550   CREATININE 2.23 (H) 08/25/2017 1528   CREATININE 1.38 (H) 08/31/2015 1637   CALCIUM 8.4 (L) 08/24/2017 1550   GFRNONAA 31 (L) 08/25/2017 1528   GFRNONAA 57 (L) 08/31/2015 1637   GFRAA 36 (L) 08/25/2017 1528   GFRAA 66 08/31/2015 1637    Lipid Panel     Component Value Date/Time   CHOL 176 08/31/2015 1637   TRIG 173 (H) 08/31/2015 1637   HDL 48 08/31/2015 1637   CHOLHDL 3.7 08/31/2015 1637   VLDL 35 (H) 08/31/2015 1637   LDLCALC 93 08/31/2015 1637    CBC    Component Value Date/Time   WBC 4.0 08/24/2017 1550   RBC 3.58 (L) 08/24/2017 1550   HGB 10.5 (L) 08/24/2017 1550   HCT 31.4 (L) 08/24/2017 1550   PLT 114 (L) 08/24/2017 1550   MCV 87.7 08/24/2017 1550   MCH 29.3 08/24/2017 1550   MCHC 33.4 08/24/2017 1550   RDW 15.1 08/24/2017 1550   LYMPHSABS 1.2 08/22/2017 2030   MONOABS 0.2 08/22/2017 2030   EOSABS 0.2 08/22/2017 2030   BASOSABS 0.0 08/22/2017 2030    Hgb A1C Lab Results  Component Value Date   HGBA1C 4.5 (L) 08/24/2017     Assessment and Plan:

## 2017-12-18 ENCOUNTER — Other Ambulatory Visit: Payer: Medicare Other

## 2017-12-20 ENCOUNTER — Other Ambulatory Visit: Payer: Medicare Other

## 2017-12-20 DIAGNOSIS — B20 Human immunodeficiency virus [HIV] disease: Secondary | ICD-10-CM

## 2017-12-20 DIAGNOSIS — I10 Essential (primary) hypertension: Secondary | ICD-10-CM

## 2017-12-20 DIAGNOSIS — E785 Hyperlipidemia, unspecified: Secondary | ICD-10-CM | POA: Diagnosis not present

## 2017-12-20 DIAGNOSIS — I160 Hypertensive urgency: Secondary | ICD-10-CM

## 2017-12-20 LAB — CBC WITH DIFFERENTIAL/PLATELET
Basophils Absolute: 19 cells/uL (ref 0–200)
Basophils Relative: 0.4 %
Eosinophils Absolute: 341 cells/uL (ref 15–500)
Eosinophils Relative: 7.1 %
HCT: 34.4 % — ABNORMAL LOW (ref 38.5–50.0)
Hemoglobin: 11.3 g/dL — ABNORMAL LOW (ref 13.2–17.1)
Lymphs Abs: 1186 cells/uL (ref 850–3900)
MCH: 27.9 pg (ref 27.0–33.0)
MCHC: 32.8 g/dL (ref 32.0–36.0)
MCV: 84.9 fL (ref 80.0–100.0)
MPV: 10.6 fL (ref 7.5–12.5)
Monocytes Relative: 7.1 %
Neutro Abs: 2914 cells/uL (ref 1500–7800)
Neutrophils Relative %: 60.7 %
Platelets: 162 10*3/uL (ref 140–400)
RBC: 4.05 10*6/uL — ABNORMAL LOW (ref 4.20–5.80)
RDW: 14.7 % (ref 11.0–15.0)
Total Lymphocyte: 24.7 %
WBC mixed population: 341 cells/uL (ref 200–950)
WBC: 4.8 10*3/uL (ref 3.8–10.8)

## 2017-12-20 LAB — COMPLETE METABOLIC PANEL WITH GFR
AG Ratio: 0.8 (calc) — ABNORMAL LOW (ref 1.0–2.5)
ALT: 26 U/L (ref 9–46)
AST: 23 U/L (ref 10–35)
Albumin: 3.6 g/dL (ref 3.6–5.1)
Alkaline phosphatase (APISO): 130 U/L — ABNORMAL HIGH (ref 40–115)
BUN/Creatinine Ratio: 13 (calc) (ref 6–22)
BUN: 30 mg/dL — ABNORMAL HIGH (ref 7–25)
CO2: 24 mmol/L (ref 20–32)
Calcium: 9 mg/dL (ref 8.6–10.3)
Chloride: 107 mmol/L (ref 98–110)
Creat: 2.28 mg/dL — ABNORMAL HIGH (ref 0.70–1.33)
GFR, Est African American: 35 mL/min/{1.73_m2} — ABNORMAL LOW (ref >=60)
GFR, Est Non African American: 30 mL/min/{1.73_m2} — ABNORMAL LOW (ref >=60)
Globulin: 4.3 g/dL (calc) — ABNORMAL HIGH (ref 1.9–3.7)
Glucose, Bld: 108 mg/dL — ABNORMAL HIGH (ref 65–99)
Potassium: 4.3 mmol/L (ref 3.5–5.3)
Sodium: 139 mmol/L (ref 135–146)
Total Bilirubin: 0.3 mg/dL (ref 0.2–1.2)
Total Protein: 7.9 g/dL (ref 6.1–8.1)

## 2017-12-22 LAB — T-HELPER CELL (CD4) - (RCID CLINIC ONLY)
CD4 % Helper T Cell: 20 % — ABNORMAL LOW (ref 33–55)
CD4 T Cell Abs: 260 /uL — ABNORMAL LOW (ref 400–2700)

## 2017-12-25 LAB — HIV-1 RNA QUANT-NO REFLEX-BLD
HIV 1 RNA Quant: 64800 copies/mL — ABNORMAL HIGH
HIV-1 RNA Quant, Log: 4.81 Log copies/mL — ABNORMAL HIGH

## 2018-01-03 ENCOUNTER — Ambulatory Visit: Payer: Self-pay | Admitting: *Deleted

## 2018-01-03 ENCOUNTER — Telehealth: Payer: Self-pay | Admitting: *Deleted

## 2018-01-03 ENCOUNTER — Ambulatory Visit (INDEPENDENT_AMBULATORY_CARE_PROVIDER_SITE_OTHER): Payer: Medicare Other | Admitting: Infectious Disease

## 2018-01-03 VITALS — BP 188/99 | HR 73 | Temp 98.3°F | Ht 66.0 in | Wt 122.0 lb

## 2018-01-03 DIAGNOSIS — F0150 Vascular dementia without behavioral disturbance: Secondary | ICD-10-CM

## 2018-01-03 DIAGNOSIS — I10 Essential (primary) hypertension: Secondary | ICD-10-CM

## 2018-01-03 DIAGNOSIS — Z91199 Patient's noncompliance with other medical treatment and regimen due to unspecified reason: Secondary | ICD-10-CM

## 2018-01-03 DIAGNOSIS — N182 Chronic kidney disease, stage 2 (mild): Secondary | ICD-10-CM

## 2018-01-03 DIAGNOSIS — Z0000 Encounter for general adult medical examination without abnormal findings: Secondary | ICD-10-CM

## 2018-01-03 DIAGNOSIS — B20 Human immunodeficiency virus [HIV] disease: Secondary | ICD-10-CM

## 2018-01-03 DIAGNOSIS — Z8673 Personal history of transient ischemic attack (TIA), and cerebral infarction without residual deficits: Secondary | ICD-10-CM

## 2018-01-03 DIAGNOSIS — Z9119 Patient's noncompliance with other medical treatment and regimen: Secondary | ICD-10-CM

## 2018-01-03 DIAGNOSIS — I160 Hypertensive urgency: Secondary | ICD-10-CM

## 2018-01-03 DIAGNOSIS — F0281 Dementia in other diseases classified elsewhere with behavioral disturbance: Secondary | ICD-10-CM

## 2018-01-03 MED ORDER — ATORVASTATIN CALCIUM 20 MG PO TABS: 20.0000 mg | ORAL_TABLET | Freq: Every day | ORAL | 11 refills | Status: DC

## 2018-01-03 MED ORDER — AMLODIPINE BESYLATE 10 MG PO TABS: 10.0000 mg | ORAL_TABLET | Freq: Every day | ORAL | 11 refills | Status: DC

## 2018-01-03 MED ORDER — FOLIC ACID 1 MG PO TABS: 1.0000 mg | ORAL_TABLET | Freq: Every day | ORAL | 11 refills | Status: DC

## 2018-01-03 MED ORDER — THIAMINE HCL 100 MG PO TABS: 100.0000 mg | ORAL_TABLET | Freq: Every day | ORAL | 11 refills | Status: DC

## 2018-01-03 MED ORDER — BICTEGRAVIR-EMTRICITAB-TENOFOV 50-200-25 MG PO TABS: 1.0000 | ORAL_TABLET | Freq: Every day | ORAL | 11 refills | Status: DC

## 2018-01-03 MED ORDER — LABETALOL HCL 100 MG PO TABS: 100.0000 mg | ORAL_TABLET | Freq: Two times a day (BID) | ORAL | 5 refills | Status: DC

## 2018-01-03 MED FILL — LABETALOL HCL 100 MG TABS: 100 | 30 days supply | Qty: 60 | Fill #0

## 2018-01-03 MED FILL — BIKTARVY 50-200-25 MG TABS: 50-200-25 | 30 days supply | Qty: 30 | Fill #0

## 2018-01-03 MED FILL — AMLODIPINE BESYLATE 10 MG T: 10 | 30 days supply | Qty: 30 | Fill #0

## 2018-01-03 NOTE — Progress Notes (Signed)
HPI: Peter Cox is a 57 y.o. male who presents to the [facility] clinic today to initiate ongoing care for his HIV infection with Dr. Tommy Medal.  Patient Active Problem List   Diagnosis Date Noted  . Cocaine abuse ([place]) 08/23/2017  . Hypertensive urgency 08/23/2017  . Acute kidney injury superimposed on CKD ([place]) 08/23/2017  . Hypertensive emergency 08/23/2017  . Drug abuse ([place]) 11/24/2014  . Alcohol abuse 11/24/2014  . HIV dementia ([place]) 07/02/2014  . Dementia due to HIV infection with behavioral disturbance ([place]) 07/02/2014  . CVA (cerebral infarction) 07/02/2014  . Depression 04/30/2014  . Lipoma 03/27/2013  . Marijuana intoxication ([place]) 03/27/2013  . Preventative health care 03/13/2013  . Smoking 03/13/2013  . Poor housing 03/13/2013  . History of CVA 03/12/2013  . CKD stage 2 11/20/2008  . Human immunodeficiency virus (HIV) disease ([place]) 08/17/2008  . SUBSTANCE ABUSE, MULTIPLE 08/08/2008  . Dementia, multiinfarct 08/08/2008  . DYSLIPIDEMIA 06/26/2008  . Essential hypertension 06/24/2008  . Left shoulder pain 06/24/2008    Patient's Medications  New Prescriptions   BICTEGRAVIR-EMTRICITABINE-TENOFOVIR AF (BIKTARVY) 50-200-25 MG TABS TABLET    Take 1 tablet by mouth daily.  Previous Medications   ESCITALOPRAM (LEXAPRO) 10 MG TABLET    Take 1 tablet (10 mg total) by mouth daily.  Modified Medications   Modified Medication Previous Medication   AMLODIPINE (NORVASC) 10 MG TABLET amLODipine (NORVASC) 10 MG tablet      Take 1 tablet (10 mg total) by mouth daily.    Take 1 tablet (10 mg total) by mouth daily.   ATORVASTATIN (LIPITOR) 20 MG TABLET atorvastatin (LIPITOR) 20 MG tablet      Take 1 tablet (20 mg total) by mouth daily at 6 PM.    Take 1 tablet (20 mg total) by mouth daily at 6 PM.   FOLIC ACID (FOLVITE) 1 MG TABLET folic acid (FOLVITE) 1 MG tablet      Take 1 tablet (1 mg total) by mouth daily.    Take 1 tablet (1 mg total) by mouth daily.   LABETALOL (NORMODYNE)  100 MG TABLET labetalol (NORMODYNE) 100 MG tablet      Take 1 tablet (100 mg total) by mouth 2 (two) times daily.    Take 1 tablet (100 mg total) by mouth 2 (two) times daily.   THIAMINE 100 MG TABLET thiamine 100 MG tablet      Take 1 tablet (100 mg total) by mouth daily.    Take 1 tablet (100 mg total) by mouth daily.  Discontinued Medications   ASPIRIN 81 MG TABLET    Take 1 tablet (81 mg total) by mouth daily.   CLONIDINE (CATAPRES) 0.3 MG TABLET    Take 1 tablet (0.3 mg total) by mouth 3 (three) times daily.   DARUNAVIR-COBICISTAT (PREZCOBIX) 800-150 MG TABLET    Take 1 tablet by mouth daily.   EMTRICITABINE-TENOFOVIR AF (DESCOVY) 200-25 MG TABLET    Take 1 tablet by mouth daily.   FEEDING SUPPLEMENT, ENSURE ENLIVE, (ENSURE ENLIVE) LIQD    Take 237 mLs by mouth 2 (two) times daily between meals.   MULTIPLE VITAMIN (MULTIVITAMIN WITH MINERALS) TABS TABLET    Take 1 tablet by mouth daily.    Allergies: No Known Allergies  Past Medical History: Past Medical History:  Diagnosis Date  . Alcohol abuse    heavy, clean since April  . Alcohol abuse 11/24/2014  . Cocaine abuse ([place])     clean since about 2000  .  Drug abuse ([place]) 11/24/2014  . HIV infection ([place])    dx 08/08/2008 (unknown how he was exposed)  Initial VL 43,200 and CD4 410 on 08/28/08  . Hyperlipidemia   . Hypertension   . Marijuana abuse   . Mild cognitive impairment    likely vascular dementia/psa/?HIV componentCT Head 02/12/08 for htn crisis and concern for stroke: impression-1. Old right internal capsule and thalamic lacunar infarcts. 2. Mild to moderate chronic small vessell white matter ischemic changes in both cerebral hemisphere, greater on the right  . Neuromuscular disorder ([place])   . Seizures ([place])    "last year had seizure"  . Stroke ([place])   . Tobacco abuse     Social History: Social History   Socioeconomic History  . Marital status: Single    Spouse name: Not on file  . Number of children: Not on file  .  Years of education: Not on file  . Highest education level: Not on file  Occupational History  . Occupation: Unemployed  Social Needs  . Financial resource strain: Not on file  . Food insecurity:    Worry: Not on file    Inability: Not on file  . Transportation needs:    Medical: Not on file    Non-medical: Not on file  Tobacco Use  . Smoking status: Current Every Day Smoker    Packs/day: 0.50    Years: 40.00    Pack years: 20.00    Types: Cigarettes  . Smokeless tobacco: Never Used  . Tobacco comment: Quit  x 2 week.  Substance and Sexual Activity  . Alcohol use: Yes    Alcohol/week: 0.0 oz    Comment: drink beer  . Drug use: Yes    Frequency: 7.0 times per week    Types: Marijuana    Comment: use Marijuana every day per pt.  . Sexual activity: Not Currently    Birth control/protection: Condom    Comment: given 2 bags of condoms  Lifestyle  . Physical activity:    Days per week: Not on file    Minutes per session: Not on file  . Stress: Not on file  Relationships  . Social connections:    Talks on phone: Not on file    Gets together: Not on file    Attends religious service: Not on file    Active member of club or organization: Not on file    Attends meetings of clubs or organizations: Not on file    Relationship status: Not on file  Other Topics Concern  . Not on file  Social History Narrative   Lives alone in [place].  Male friend [name]) and her family assist the patient with home management.  They have been friends x 71yrs but are no longer sexually active.  Not working. 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    Labs: Lab Results  Component Value Date   HIV1RNAQUANT 64,800 (H) 12/20/2017   HIV1RNAQUANT 103,000 08/23/2017   HIV1RNAQUANT 36,848 (H) 08/31/2015   CD4TABS 260 (L) 12/20/2017   CD4TABS 340 (L) 08/23/2017   CD4TABS 500 08/31/2015    RPR and STI Lab Results  Component Value Date     LABRPR NON REAC 08/31/2015   LABRPR NON REAC 11/24/2014   LABRPR NON REAC 08/11/2014   LABRPR NON REAC 03/24/2014   LABRPR NON REACTIVE 11/23/2012    STI Results GC CT  03/24/2014 NG: Negative  CT: Negative    Hepatitis B Lab Results  Component Value Date   HEPBSAG NEGATIVE 11/23/2012   HEPBCAB NEGATIVE 11/23/2012   Hepatitis C No results found for: HEPCAB, HCVRNAPCRQN Hepatitis A No results found for: HAV Lipids: Lab Results  Component Value Date   CHOL 176 08/31/2015   TRIG 173 (H) 08/31/2015   HDL 48 08/31/2015   CHOLHDL 3.7 08/31/2015   VLDL 35 (H) 08/31/2015   LDLCALC 93 08/31/2015    Current HIV Regimen: Biktarvy maybe?  Assessment: Peter Cox is here today to initiate care with Dr. Tommy Medal. He [facility] and has moved to [place] with his family.  He has a very poor history of follow-up and compliance and has had a detectable viral load for several years.  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  He was put on Biktarvy back in June at [facility].  We will continue the [place] for now and have him follow-up closely with [name]. I discussed this with him and his friend that helps take care of him. I encouraged adherence and described how to take [place].  He has [employer] so Inez Catalina will help him get co-pay assistance. I will see him again in 2-3 weeks and recheck labs/adherence.  Of note, if we cannot get in touch with him or his sister, we can call his friend Peter Cox at [phone].  Plan: - Continue/start Biktarvy PO once daily - Fill at [facility] - F/u with me 8/7 at 230pm - F/u with Dr. Tommy Medal 2-3 weeks after my appointment  Aydien Majette L. Robben Jagiello, PharmD, [place], [place] for Infectious Disease 01/03/2018, 10:07 AM

## 2018-01-03 NOTE — Patient Instructions (Signed)
Appt with Cassie in [place] where we will check repeat Viral load ON BIKTARVY  Appt with MYSELF OR NP DIXON or CALONE in [place]  Then again with ID pharmacy in 6 weeks

## 2018-01-03 NOTE — Progress Notes (Signed)
Re-establish care for HIV disease not on meds with problems with laughing and cognition  Subjective:    Patient ID: Peter Cox, male    DOB: 1961-03-26, 57 y.o.   MRN: [id number]  HPI  Peter Cox is a 57 year old man with HIV disease that is been terribly controlled over the years.  He also suffers from uncontrolled hypertension history of a stroke polysubstance abuse and multifactorial dementia including likely multi-infarct dementia and HIV related dementia.  He has not been seen in our clinic since June 2016.  He has been seen at [facility] clinic by Dr. Tora Perches and then more recently by Edwyna Shell at [facility].  He has most recently been which were prescribed Biktarvy but he has not been taking this medication.  His former wife died recently due to problems with dementia and [name] is now living with 1 of his daughters.  The other daughter Peter Cox came today to bring him to our clinic.  She vouches that they will make sure that he gets his medications and takes them.  We had an extensive conversation with the daughter and also with Peter Cox with regards to the need to take his antiretroviral medications.  She is quite concerned about the fact that he at times has laughter for no apparent reason.  I have observed him having his behavior in the past as well.  I think it is likely partly attributed to his dementia.  As usual he is very soft-spoken and does not say much.  He does not look look completely unkept though his fingernails are grown longer than normal.  He has several missing teeth.  Past Medical History:  Diagnosis Date  . Alcohol abuse    heavy, clean since April  . Alcohol abuse 11/24/2014  . Cocaine abuse ([place])     clean since about 2000  . Drug abuse ([place]) 11/24/2014  . HIV infection ([place])    dx 08/08/2008 (unknown how he was exposed)  Initial VL 43,200 and CD4 410 on 08/28/08  . Hyperlipidemia   . Hypertension   . Marijuana abuse   . Mild cognitive  impairment    likely vascular dementia/psa/?HIV componentCT Head 02/12/08 for htn crisis and concern for stroke: impression-1. Old right internal capsule and thalamic lacunar infarcts. 2. Mild to moderate chronic small vessell white matter ischemic changes in both cerebral hemisphere, greater on the right  . Neuromuscular disorder ([place])   . Seizures ([place])    "last year had seizure"  . Stroke ([place])   . Tobacco abuse     Past Surgical History:  Procedure Laterality Date  . CLOSED REDUCTION PATELLAR     right knee  . HEMORRHOID SURGERY    . SHOULDER ARTHROSCOPY  01/2008   w/extensive debridement (Dr Mardelle Matte)    Family History  Problem Relation Age of Onset  . Diabetes Mother   . Hypertension Mother   . Stroke Mother   . Diabetes Father   . Hypertension Sister   . Hypertension Sister   . Colon cancer Neg Hx       Social History   Socioeconomic History  . Marital status: Single    Spouse name: Not on file  . Number of children: Not on file  . Years of education: Not on file  . Highest education level: Not on file  Occupational History  . Occupation: Unemployed  Social Needs  . Financial resource strain: Not on file  . Food insecurity:    Worry:  Not on file    Inability: Not on file  . Transportation needs:    Medical: Not on file    Non-medical: Not on file  Tobacco Use  . Smoking status: Current Every Day Smoker    Packs/day: 0.50    Years: 40.00    Pack years: 20.00    Types: Cigarettes  . Smokeless tobacco: Never Used  . Tobacco comment: Quit  x 2 week.  Substance and Sexual Activity  . Alcohol use: Yes    Alcohol/week: 0.0 oz    Comment: drink beer  . Drug use: Yes    Frequency: 7.0 times per week    Types: Marijuana    Comment: use Marijuana every day per pt.  . Sexual activity: Not Currently    Birth control/protection: Condom    Comment: given 2 bags of condoms  Lifestyle  . Physical activity:    Days per week: Not on file    Minutes per session:  Not on file  . Stress: Not on file  Relationships  . Social connections:    Talks on phone: Not on file    Gets together: Not on file    Attends religious service: Not on file    Active member of club or organization: Not on file    Attends meetings of clubs or organizations: Not on file    Relationship status: Not on file  Other Topics Concern  . Not on file  Social History Narrative   Lives alone in [place].  Male friend [name]) and her family assist the patient with home management.  They have been friends x 32yrs but are no longer sexually active.  Not working. 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    No Known Allergies   Current Outpatient Medications:  .  amLODipine (NORVASC) 10 MG tablet, Take 1 tablet (10 mg total) by mouth daily., Disp: 30 tablet, Rfl: 11 .  atorvastatin (LIPITOR) 20 MG tablet, Take 1 tablet (20 mg total) by mouth daily at 6 PM., Disp: 30 tablet, Rfl: 11 .  bictegravir-emtricitabine-tenofovir AF (BIKTARVY) 50-200-25 MG TABS tablet, Take 1 tablet by mouth daily., Disp: 30 tablet, Rfl: 11 .  escitalopram (LEXAPRO) 10 MG tablet, Take 1 tablet (10 mg total) by mouth daily. (Patient not taking: Reported on 11/23/2017), Disp: 30 tablet, Rfl: 0 .  folic acid (FOLVITE) 1 MG tablet, Take 1 tablet (1 mg total) by mouth daily., Disp: 30 tablet, Rfl: 11 .  labetalol (NORMODYNE) 100 MG tablet, Take 1 tablet (100 mg total) by mouth 2 (two) times daily., Disp: 60 tablet, Rfl: 5 .  thiamine 100 MG tablet, Take 1 tablet (100 mg total) by mouth daily., Disp: 30 tablet, Rfl: 11    Review of Systems  Unable to perform ROS: Dementia       Objective:   Physical Exam  HENT:  Head: Normocephalic.  Mouth/Throat: Oropharynx is clear and moist. No oropharyngeal exudate.  Eyes: EOM are normal. Right eye exhibits no discharge. Left eye exhibits no discharge.  Neck: Normal range of motion. No JVD present. No tracheal  deviation present. No thyromegaly present.  Cardiovascular: Normal rate and regular rhythm.  Pulmonary/Chest: Effort normal. No respiratory distress. He has no wheezes.  Abdominal: He exhibits no distension.  Musculoskeletal: Normal range of motion.  Neurological: He is alert.  Skin: Skin is warm. No rash noted. No erythema.  Psychiatric: His affect is  inappropriate. His speech is delayed. Cognition and memory are impaired. He expresses inappropriate judgment. He is noncommunicative. He exhibits abnormal recent memory and abnormal remote memory.          Assessment & Plan:   HIV disease: Peter Cox will engage with the patient and I introduced her to him.  She will pick up his prescription of Biktarvy and make sure that he has at home and can be taking it reliably.  I have also refilled his amlodipine and his labetalol as well as his statin and his B6 and B12.  We will have him come back to clinic in 2 weeks time to meet with Cassie with ID pharmacy and have blood work repeated.  They will have him come back 2 weeks after that for another visit.   HIV and potentially multi-infarct associated dementia: This will largely be an issue of damage control though I think perhaps controlling his HIV might improve some of his cognition.  He may also benefit from being seen by a neurologist or geriatrician.  Uncontrolled hypertension: I am renewing these medications and hope that he can be adherent to them.   Polysubstance abuse: He claims he is not drinking alcohol or using drugs.  His daughter lives with and is not observed him doing so but we need to have a high degree of vigilance for this.  I spent greater than 60 minutes with the patient including greater than 50% of time in face to face counsel of the patient and his daughter regards to the need to be adherent to antiretroviral medications and antihypertensive medications and the consequences to his health and to potentially him dying  from this infection and complications from it and in coordination of his care.

## 2018-01-10 ENCOUNTER — Ambulatory Visit: Payer: Self-pay | Admitting: *Deleted

## 2018-01-10 ENCOUNTER — Telehealth: Payer: Self-pay | Admitting: *Deleted

## 2018-01-10 VITALS — BP 172/102 | HR 80

## 2018-01-10 DIAGNOSIS — Z9119 Patient's noncompliance with other medical treatment and regimen: Secondary | ICD-10-CM

## 2018-01-10 DIAGNOSIS — B20 Human immunodeficiency virus [HIV] disease: Secondary | ICD-10-CM

## 2018-01-10 DIAGNOSIS — Z91199 Patient's noncompliance with other medical treatment and regimen due to unspecified reason: Secondary | ICD-10-CM

## 2018-01-10 NOTE — Telephone Encounter (Addendum)
Order received on 01/03/18 by Dr. Tommy Medal  to evaluate patient for [facility]).  Patient was evaluated on 01/03/18 for CBHCNS. Patient was consented to care at this time.   Frequency / Duration of CBHCN visits: Effective: 16mo1, 7mo1, 106mo1, 61mo1,  4 PRN's for complications with disease process/progression, medication changes or concerns   Initial Assessment Points to Consider for Care  Points are not all inclusive to services and educated provided but supports the patient's Individualized Plan of Care.  . Is Home safe for visits? Yes .  Are all firearms or weapons secure? Yes . Insurance Coverage: MCD/MCR . 1st HIV Diagnosis: 2010 . Mode of HIV Transmission: possible IV drug use and sexual contact . Functional Status: WNL . Current Housing/Needs: lives with his dtr in [place] but has a subsidized apartment in [place] . Social Support/System:in place . Culture/Religion/Spirituality: will not interfere with the plan of care . Educational Background: limited literacy . Legal Issues: none noted . Access/Utilization of Community Resources: able to access resources . Mental Health Concerns/Diagnosis: Depression related to the lost of his long time partner (non HIV related death) . Alcohol and/or Drug XNA:TFTDDUK, marijuana, hx of cocaine "crack" use . Risk and Knowledge of HIV and Reduction in Transmission: limited knowledge and understanding about the need to adhere to his medications . Nutritional Needs: will continue to be assessed but no current needs at this time   [facility] will assess for learning needs related to diagnosis and treatment regimen, provide education as needed, fill pill box if needed, address any barriers which may be preventing medication compliance, and communicating with care team including physician and case manager.   Individualized Plan Of Care effective 01/03/18 to 04/03/18  a. Type of service(s) and care to be delivered:  RN Case Management  b. Frequency and duration of service: Effective 01/03/18 37mo1, 40mo3, 57mo1, 31mo1, 4 prns for complications with disease process/progression, medication changes or concerns . Visits/Contact may be conducted telephonically or in person to best suit the patient.  c. Activity restrictions: Pt may be up as tolerated and can safely ambulate without the need for a assistive device   d. Safety Measures: Standard Precautions/Infection Control, fall prevention  e. Service Objectives and Goals: Service Objectives are to assist the pt with HIV medication regimen adherence and staying in care with the Infectious Disease Clinic by identifying barriers      to care. RN will address the barriers that are identified by the patient. Patient centered goals is to take his medications and not have to be hospitalized   f. Equipment required: No additional equipment needs at this time   g. Functional Limitations: No concerns noted  h. Rehabilitation potential: Guarded   i. Diet and Nutritional Needs: Regular Diet, further nutritional assessment to be provided  j. Medications and treatments: Medications have been reconciled and reviewed and are a part of EPIC electronic file   k. Specific therapies if needed: RN   l. Pertinent diagnoses: HIV disease,  Hx of medication NonCompliance, Hx of stroke, HTN, stage 2 kidney disease  m. Expected outcome: Guarded

## 2018-01-10 NOTE — Progress Notes (Signed)
Home visit made today at his dtr's home in [place]. [name] has all medications and states he has been taking them each morning. Informed Mr Terrance and his dtr that he has BP medications that are twice a day and that may be the reason for his elevated BP.  Pillbox filled at this time and contacted his dtr with a update on this visit. Dtr also stated she consolidated the patient's medications and this is the reason why some medications inside the pill bottle look different. Next visit planned for next week.

## 2018-01-10 NOTE — Telephone Encounter (Signed)
Contacted Peter. Peter Cox' dtr Peter Cox) to confirm that the visit for today is still ok. She confirmed the visit and stated the pill box is still at her other sister's home in [place] and she has been giving Peter Cox his medications out of the bottle each morning. I questioned the PM medications and Peter Cox stated she was not aware that he had PM medications but will begin to administer his PM doses as well. I let her know that I will bring another pillbox for her to leave at her home. Visit planned for today

## 2018-01-17 ENCOUNTER — Ambulatory Visit: Payer: Self-pay | Admitting: *Deleted

## 2018-01-17 DIAGNOSIS — F0150 Vascular dementia without behavioral disturbance: Secondary | ICD-10-CM

## 2018-01-17 DIAGNOSIS — Z9119 Patient's noncompliance with other medical treatment and regimen: Secondary | ICD-10-CM

## 2018-01-17 DIAGNOSIS — Z91199 Patient's noncompliance with other medical treatment and regimen due to unspecified reason: Secondary | ICD-10-CM

## 2018-01-17 DIAGNOSIS — B20 Human immunodeficiency virus [HIV] disease: Secondary | ICD-10-CM

## 2018-01-17 DIAGNOSIS — I10 Essential (primary) hypertension: Secondary | ICD-10-CM

## 2018-01-17 MED FILL — ATORVASTATIN CALCIUM 20 MG: 20 | 30 days supply | Qty: 30 | Fill #0

## 2018-01-22 NOTE — Telephone Encounter (Signed)
I approve of this plan

## 2018-01-24 ENCOUNTER — Ambulatory Visit (INDEPENDENT_AMBULATORY_CARE_PROVIDER_SITE_OTHER): Payer: Medicare Other | Admitting: Pharmacist

## 2018-01-24 DIAGNOSIS — B20 Human immunodeficiency virus [HIV] disease: Secondary | ICD-10-CM | POA: Diagnosis not present

## 2018-01-24 DIAGNOSIS — I10 Essential (primary) hypertension: Secondary | ICD-10-CM | POA: Diagnosis not present

## 2018-01-24 NOTE — Progress Notes (Signed)
HPI: Peter Cox is a 57 y.o. male who presents to the [place] clinic for HIV follow-up.  Patient Active Problem List   Diagnosis Date Noted  . Cocaine abuse ([place]) 08/23/2017  . Hypertensive urgency 08/23/2017  . Acute kidney injury superimposed on CKD ([place]) 08/23/2017  . Hypertensive emergency 08/23/2017  . Drug abuse ([place]) 11/24/2014  . Alcohol abuse 11/24/2014  . HIV dementia ([place]) 07/02/2014  . Dementia due to HIV infection with behavioral disturbance ([place]) 07/02/2014  . CVA (cerebral infarction) 07/02/2014  . Depression 04/30/2014  . Lipoma 03/27/2013  . Marijuana intoxication ([place]) 03/27/2013  . Preventative health care 03/13/2013  . Smoking 03/13/2013  . Poor housing 03/13/2013  . History of CVA 03/12/2013  . CKD stage 2 11/20/2008  . Human immunodeficiency virus (HIV) disease ([place]) 08/17/2008  . SUBSTANCE ABUSE, MULTIPLE 08/08/2008  . Dementia, multiinfarct 08/08/2008  . DYSLIPIDEMIA 06/26/2008  . Essential hypertension 06/24/2008  . Left shoulder pain 06/24/2008    Patient's Medications  New Prescriptions   No medications on file  Previous Medications   AMLODIPINE (NORVASC) 10 MG TABLET    Take 1 tablet (10 mg total) by mouth daily.   ATORVASTATIN (LIPITOR) 20 MG TABLET    Take 1 tablet (20 mg total) by mouth daily at 6 PM.   BICTEGRAVIR-EMTRICITABINE-TENOFOVIR AF (BIKTARVY) 50-200-25 MG TABS TABLET    Take 1 tablet by mouth daily.   ESCITALOPRAM (LEXAPRO) 10 MG TABLET    Take 1 tablet (10 mg total) by mouth daily.   FOLIC ACID (FOLVITE) 1 MG TABLET    Take 1 tablet (1 mg total) by mouth daily.   LABETALOL (NORMODYNE) 100 MG TABLET    Take 1 tablet (100 mg total) by mouth 2 (two) times daily.   THIAMINE 100 MG TABLET    Take 1 tablet (100 mg total) by mouth daily.  Modified Medications   No medications on file  Discontinued Medications   No medications on file    Allergies: No Known Allergies  Past Medical History: Past Medical History:    Diagnosis Date  . Alcohol abuse    heavy, clean since April  . Alcohol abuse 11/24/2014  . Cocaine abuse ([name])     clean since about 2000  . Drug abuse ([place]) 11/24/2014  . HIV infection ([place])    dx 08/08/2008 (unknown how he was exposed)  Initial VL 43,200 and CD4 410 on 08/28/08  . Hyperlipidemia   . Hypertension   . Marijuana abuse   . Mild cognitive impairment    likely vascular dementia/psa/?HIV componentCT Head 02/12/08 for htn crisis and concern for stroke: impression-1. Old right internal capsule and thalamic lacunar infarcts. 2. Mild to moderate chronic small vessell white matter ischemic changes in both cerebral hemisphere, greater on the right  . Neuromuscular disorder ([place])   . Seizures ([place])    "last year had seizure"  . Stroke ([place])   . Tobacco abuse     Social History: Social History   Socioeconomic History  . Marital status: Single    Spouse name: Not on file  . Number of children: Not on file  . Years of education: Not on file  . Highest education level: Not on file  Occupational History  . Occupation: Unemployed  Social Needs  . Financial resource strain: Not on file  . Food insecurity:    Worry: Not on file    Inability: Not on file  . Transportation needs:    Medical: Not  on file    Non-medical: Not on file  Tobacco Use  . Smoking status: Current Every Day Smoker    Packs/day: 0.50    Years: 40.00    Pack years: 20.00    Types: Cigarettes  . Smokeless tobacco: Never Used  . Tobacco comment: Quit  x 2 week.  Substance and Sexual Activity  . Alcohol use: Yes    Alcohol/week: 0.0 oz    Comment: drink beer  . Drug use: Yes    Frequency: 7.0 times per week    Types: Marijuana    Comment: use Marijuana every day per pt.  . Sexual activity: Not Currently    Birth control/protection: Condom    Comment: given 2 bags of condoms  Lifestyle  . Physical activity:    Days per week: Not on file    Minutes per session: Not on file  . Stress: Not on file   Relationships  . Social connections:    Talks on phone: Not on file    Gets together: Not on file    Attends religious service: Not on file    Active member of club or organization: Not on file    Attends meetings of clubs or organizations: Not on file    Relationship status: Not on file  Other Topics Concern  . Not on file  Social History Narrative   Lives alone in [place].  Male friend [name]) and her family assist the patient with home management.  They have been friends x 74yrs but are no longer sexually active.  Not working. 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    Labs: Lab Results  Component Value Date   HIV1RNAQUANT 64,800 (H) 12/20/2017   HIV1RNAQUANT 103,000 08/23/2017   HIV1RNAQUANT 36,848 (H) 08/31/2015   CD4TABS 260 (L) 12/20/2017   CD4TABS 340 (L) 08/23/2017   CD4TABS 500 08/31/2015    RPR and STI Lab Results  Component Value Date   LABRPR NON REAC 08/31/2015   LABRPR NON REAC 11/24/2014   LABRPR NON REAC 08/11/2014   LABRPR NON REAC 03/24/2014   LABRPR NON REACTIVE 11/23/2012    STI Results GC CT  03/24/2014 NG: Negative CT: Negative    Hepatitis B Lab Results  Component Value Date   HEPBSAG NEGATIVE 11/23/2012   HEPBCAB NEGATIVE 11/23/2012   Hepatitis C No results found for: HEPCAB, HCVRNAPCRQN Hepatitis A No results found for: HAV Lipids: Lab Results  Component Value Date   CHOL 176 08/31/2015   TRIG 173 (H) 08/31/2015   HDL 48 08/31/2015   CHOLHDL 3.7 08/31/2015   VLDL 35 (H) 08/31/2015   LDLCALC 93 08/31/2015    Current HIV Regimen: Biktarvy  Assessment: [name] is here today to follow-up with me for his HIV infection. He is a transfer from [facility] and recently saw Dr. Tommy Medal and myself on 7/17 where he was continued on [place]. He comes in today with his friend [place]  He and Celeste tell me that he hasn't missed any doses of his Biktarvy and that his daughter  makes sure that he takes it every day.  No side effects mentioned either.  He also tells me that he is taking his BP medications and has already taken them today.  I took his BP and it was elevated at 182/98. He is supposed to see a doctor tomorrow for his BP.  I told Celeste to make sure  they told the new doctor what his BP was running.  I will get labs today.  Since Dr. Tommy Medal is unavailable for a little while, I will set up his next appt with Marya Amsler.  Plan: - Continue Biktarvy PO once daily - HIV VL and CD4 today - F/u with Marya Amsler 9/10 at 430pm  Damieon Armendariz L. Nahum Sherrer, PharmD, [place], [facility] for Infectious Disease 01/24/2018, 3:15 PM

## 2018-01-25 ENCOUNTER — Ambulatory Visit: Payer: Medicare Other | Admitting: Internal Medicine

## 2018-01-25 ENCOUNTER — Ambulatory Visit: Payer: Medicaid Other | Admitting: Internal Medicine

## 2018-01-25 DIAGNOSIS — Z0289 Encounter for other administrative examinations: Secondary | ICD-10-CM

## 2018-01-25 LAB — T-HELPER CELL (CD4) - (RCID CLINIC ONLY)
CD4 % Helper T Cell: 20 % — ABNORMAL LOW (ref 33–55)
CD4 T Cell Abs: 270 /uL — ABNORMAL LOW (ref 400–2700)

## 2018-01-26 LAB — COMPREHENSIVE METABOLIC PANEL
AG Ratio: 1 (calc) (ref 1.0–2.5)
ALT: 8 U/L — ABNORMAL LOW (ref 9–46)
AST: 11 U/L (ref 10–35)
Albumin: 3.5 g/dL — ABNORMAL LOW (ref 3.6–5.1)
Alkaline phosphatase (APISO): 110 U/L (ref 40–115)
BUN/Creatinine Ratio: 14 (calc) (ref 6–22)
BUN: 28 mg/dL — ABNORMAL HIGH (ref 7–25)
CO2: 26 mmol/L (ref 20–32)
Calcium: 9 mg/dL (ref 8.6–10.3)
Chloride: 109 mmol/L (ref 98–110)
Creat: 2.02 mg/dL — ABNORMAL HIGH (ref 0.70–1.33)
Globulin: 3.6 g/dL (calc) (ref 1.9–3.7)
Glucose, Bld: 119 mg/dL — ABNORMAL HIGH (ref 65–99)
Potassium: 4.2 mmol/L (ref 3.5–5.3)
Sodium: 142 mmol/L (ref 135–146)
Total Bilirubin: 0.2 mg/dL (ref 0.2–1.2)
Total Protein: 7.1 g/dL (ref 6.1–8.1)

## 2018-01-26 LAB — HIV-1 RNA QUANT-NO REFLEX-BLD
HIV 1 RNA Quant: 20 copies/mL — ABNORMAL HIGH
HIV-1 RNA Quant, Log: 1.3 Log copies/mL — ABNORMAL HIGH

## 2018-01-29 MED FILL — LABETALOL HCL 100 MG TABS: 100 | 30 days supply | Qty: 60 | Fill #1

## 2018-01-29 MED FILL — BIKTARVY 50-200-25 MG TABS: 50-200-25 | 30 days supply | Qty: 30 | Fill #1

## 2018-01-29 MED FILL — AMLODIPINE BESYLATE 10 MG T: 10 | 30 days supply | Qty: 30 | Fill #1

## 2018-01-30 ENCOUNTER — Telehealth: Payer: Self-pay | Admitting: *Deleted

## 2018-01-30 NOTE — Telephone Encounter (Signed)
Contacted Ms. Ford(Vedh's dtr) as a f/u and to reassess any current needs. I expressed to Ms. Ford that during my last visit with Mr Navraj(7/31) I was concerned that he had a large amount of antiviral medication on hand even though he should be due for a refill soon. Ms Marijean Bravo stated she combined the pill bottles of  Biktarvy and that is why extra medications were present. Mr Gomes just started Nelagoney on 01/03/18 and it has not been refilled so he should not have any extra pill bottles of biktarvy to combine but I did not push the issue. I expressed to Ms Marijean Bravo that apparantly Tytan has done an amazing job with taking his medications for the last 2 weeks and he is currently undetectable. Explained what undetectable means and Ms Marijean Bravo is very happy about it. I encouraged her to please keep up the amazing job with helping  Mr Dowell take his medications daily. Advised her that his BP is still a little elevated and we can work together to manage it. Ms Morrie Sheldon me for my call and I asked that she give me call if a need arrives.

## 2018-02-15 NOTE — Progress Notes (Signed)
Prior to office visit today, I traveled to [employer] and picked up Peter Cox' medications. A home visit was made at this time with a review and education of each medication given to his friend's dtr(Peter Cox) I have also organized a pill box for him and explained to him how to use the pillbox system. Education provided on the need to take AM and PM doses of BP medications to ensure his hypertension is managed effectively. Peter Cox verbalized understanding. Next visit planned for next week with at Peter Cox' dtr's home (which is where he will be staying)

## 2018-02-15 NOTE — Progress Notes (Signed)
Referral received from Dr Lucianne Lei to reengage with the patient.  Contact made with Peter Cox today and assistance was offered at this time. Peter Cox and his friend's dtr both state the concern is access to his medications and the need for him to live with family so that can assist him with his medication adherence. Previously Peter Cox was living alone and unable to manage his medications. At this time I offered assistance with access to medications, medication education and follow up to assess any additional needs. Peter Cox agreed and consents have been signed.    RN reviewed Transport planner, [place], Home Safety Management Information Booklet. Home Fire Safety Assessment, Fall Risk Assessment and Suicide Risk Assessment was performed. RN also discussed information on a Living Will, Advanced Directives, and [place]. RN and Client/Designated Party educated/reviewed/signed Client Agreement and Consent for Service form along with Patient Rights and Responsibilities statement. RN developed patient specific and centered care plan. RN provided contact information and reviewed how to receive emergency help after hours for schedule changes, billing  questions, reporting of safety issues, falls, concerns or any needs/questions. Standard Precaution and Infection control along with interventions to correct or prevent high risk behaviors instructed to the patient. Client/Caregiver reports understanding and agreement with the above   Prior to visit I traveled to [employer] and picked up Peter Cox' medications. A home visit was made at this time with a review and education of each medication given to his friend's dtr(Peter Cox) I have also organized a pill box for him and explained to him how to use the pillbox system. Education provided on the need to take AM and PM doses of BP medications to ensure his hypertension is managed effectively. Peter Cox  verbalized understanding. Next visit planned for next week with at Peter Cox' dtr's home (which is where he will be staying)

## 2018-02-26 MED FILL — BIKTARVY 50-200-25 MG TABS: 50-200-25 | 30 days supply | Qty: 30 | Fill #2

## 2018-02-26 MED FILL — LABETALOL HCL 100 MG TABS: 100 | 30 days supply | Qty: 60 | Fill #2

## 2018-02-26 MED FILL — AMLODIPINE BESYLATE 10 MG T: 10 | 30 days supply | Qty: 30 | Fill #2

## 2018-02-26 MED FILL — ATORVASTATIN CALCIUM 20 MG: 20 | 30 days supply | Qty: 30 | Fill #1

## 2018-02-27 ENCOUNTER — Ambulatory Visit: Payer: Medicare Other | Admitting: Family

## 2018-03-06 NOTE — Progress Notes (Signed)
   CHIEF COMPLAINT:   Chief Complaint  Patient presents with  . Routine Home Visit    Subjective  Mr Simkin Denies any concerns at this time, states he continues to take his medications each day without any concerns at all     Objective : Prior to visit contacted Mr. Pasquarello' daughter who stated she does not need any help with prefilling the pill box and states that system does not work for him. Explained to the dtr that I counted Mr. Loe' pills and he still have a 30 pills in the bottle. The dtr stated she combined all of the pill bottles and reassured me that she is giving Mr Ahonen his medications every morning before she leaves for work. She did state at times she forgets his evening HTN and cholesterol medications but makes sure he gets his HIV medications everyday. I thanked Ms. Ford for allowing me to work with Mr Stigger and reminded her of his upcoming appts   Examination:  General exam: Appears calm and comfortable  Respiratory system: Clear to auscultation. Respiratory effort normal. HEENT: [place]/AT, PERRLA, no thrush, no stridor. Cardiovascular system: S1 & S2 heard. No pedal edema. Gastrointestinal system: Abdomen is nondistended, soft and nontenderNormal bowel sounds heard. Central nervous system: Alert and oriented but at times laughs inappropriately Extremities: Symmetric 5 x 5 power. Skin: No rashes, lesions or ulcers Psychiatry: Judgement and insight appears questionable.  Mood & affect appropriate.    I personally reviewed Labs under Results section. Results for DANISH, RUFFINS (MRN [id number]) as of 03/06/2018 15:12  Ref. Range 12/20/2017 09:14  HIV 1 RNA Quant Latest Ref Range: NOT DETECT copies/mL 64,800 (H)        Assessment/Plan:  Follow up with Mr Crumby next month and offer assistance as needed.   Code Status: Full  Family Communication: completed at this visit  Disposition Plan: patient states he plans to move around the corner from his daughter in  [place]   Time spent: 20 minutes not including drive time to and from the home   Alysabeth Scalia, Durward Fortes

## 2018-03-19 ENCOUNTER — Ambulatory Visit (INDEPENDENT_AMBULATORY_CARE_PROVIDER_SITE_OTHER): Payer: Medicare Other | Admitting: Family

## 2018-03-19 ENCOUNTER — Encounter: Payer: Self-pay | Admitting: Family

## 2018-03-19 VITALS — BP 188/117 | HR 68 | Temp 97.7°F | Ht 65.0 in | Wt 124.0 lb

## 2018-03-19 DIAGNOSIS — I10 Essential (primary) hypertension: Secondary | ICD-10-CM

## 2018-03-19 DIAGNOSIS — B20 Human immunodeficiency virus [HIV] disease: Secondary | ICD-10-CM

## 2018-03-19 DIAGNOSIS — Z23 Encounter for immunization: Secondary | ICD-10-CM

## 2018-03-19 MED ORDER — FUROSEMIDE 20 MG PO TABS: 10.0000 mg | ORAL_TABLET | Freq: Every day | ORAL | 0 refills | Status: DC

## 2018-03-19 MED FILL — AMLODIPINE BESYLATE 10 MG T: 10 | 30 days supply | Qty: 30 | Fill #3

## 2018-03-19 MED FILL — ATORVASTATIN CALCIUM 20 MG: 20 | 30 days supply | Qty: 30 | Fill #2

## 2018-03-19 MED FILL — LABETALOL HCL 100 MG TABS: 100 | 30 days supply | Qty: 60 | Fill #3

## 2018-03-19 MED FILL — BIKTARVY 50-200-25 MG TABS: 50-200-25 | 30 days supply | Qty: 30 | Fill #3

## 2018-03-19 MED FILL — FUROSEMIDE 20 MG TABS: 20 | 30 days supply | Qty: 15 | Fill #0

## 2018-03-19 NOTE — Patient Instructions (Signed)
Nice to meet you.  We will check you blood work in about 1 week after starting your new blood pressure medication.  Continue to take your Biktarvy, Labetolol and Amlodipine.  Try to work on decreasing the starchy/processed foods in your nutritional intake to help reduce your blood pressure.  Follow up office visit in 1 month or sooner if needed.

## 2018-03-19 NOTE — Progress Notes (Signed)
Subjective:    Patient ID: Peter Cox, male    DOB: 1960/11/25, 57 y.o.   MRN: [id number]  Chief Complaint  Patient presents with  . HIV Positive/AIDS  . Hypertension     HPI:  Peter Cox is a 57 y.o. male who presents today for routine follow up of his HIV disease. He is present today with his family and daughter is on the phone.   Peter Cox was last seen in the clinic on 01/03/18 to establish care for previously poorly controlled HIV disease, uncontrolled hyperension and history of stroke with multifactorial dementia. He was continued on the previously prescribed Biktarvy and started with outreach nursing. Review of most recent nursing visit reveals he has been taking his medication as prescribed and his viral load was most recently undetectable. His blood pressure remained slightly elevated.   Peter Cox continues to take his [place] Odor as prescribed with no adverse side effects which is affirmed by his family. He has not missed any doses recently. Continues to live with his daughter and remains sober. He has no problems obtaining his medication. Family does have concern as he is requiring 24 hour supervision and they are not able to be present at all times. Requesting possibility of a sitter to be with him for 2-3 hours per day where there is a gap. Denies fevers, chills, night sweats, headaches, changes in vision, neck pain/stiffness, nausea, diarrhea, vomiting, lesions or rashes.   Peter Cox continues to have elevated blood pressure readings with most recent during his office follow up being 182/98. He has been taking the amlodipine and labetalol as prescribed with no adverse side effects. Does not currently take his blood pressure at home. Has had changes in vision where he is having increased trouble seeing at night. Denies worst headache of life, chest pain or shortness or breath. His nutritional intake consists of pastas, fried chicken and fast foods primarily.    No Known  Allergies    Outpatient Medications Prior to Visit  Medication Sig Dispense Refill  . amLODipine (NORVASC) 10 MG tablet Take 1 tablet (10 mg total) by mouth daily. 30 tablet 11  . atorvastatin (LIPITOR) 20 MG tablet Take 1 tablet (20 mg total) by mouth daily at 6 PM. 30 tablet 11  . bictegravir-emtricitabine-tenofovir AF (BIKTARVY) 50-200-25 MG TABS tablet Take 1 tablet by mouth daily. 30 tablet 11  . folic acid (FOLVITE) 1 MG tablet Take 1 tablet (1 mg total) by mouth daily. 30 tablet 11  . labetalol (NORMODYNE) 100 MG tablet Take 1 tablet (100 mg total) by mouth 2 (two) times daily. 60 tablet 5  . thiamine 100 MG tablet Take 1 tablet (100 mg total) by mouth daily. 30 tablet 11  . escitalopram (LEXAPRO) 10 MG tablet Take 1 tablet (10 mg total) by mouth daily. (Patient not taking: Reported on 11/23/2017) 30 tablet 0   No facility-administered medications prior to visit.      Past Medical History:  Diagnosis Date  . Alcohol abuse    heavy, clean since April  . Alcohol abuse 11/24/2014  . Cocaine abuse ([place])     clean since about 2000  . Drug abuse ([place]) 11/24/2014  . HIV infection ([place])    dx 08/08/2008 (unknown how he was exposed)  Initial VL 43,200 and CD4 410 on 08/28/08  . Hyperlipidemia   . Hypertension   . Marijuana abuse   . Mild cognitive impairment    likely vascular dementia/psa/?HIV componentCT Head 02/12/08  for htn crisis and concern for stroke: impression-1. Old right internal capsule and thalamic lacunar infarcts. 2. Mild to moderate chronic small vessell white matter ischemic changes in both cerebral hemisphere, greater on the right  . Neuromuscular disorder ([place])   . Seizures ([place])    "last year had seizure"  . Stroke ([place])   . Tobacco abuse      Past Surgical History:  Procedure Laterality Date  . CLOSED REDUCTION PATELLAR     right knee  . HEMORRHOID SURGERY    . SHOULDER ARTHROSCOPY  01/2008   w/extensive debridement (Dr Mardelle Matte)       Review of Systems    Constitutional: Negative for appetite change, chills, fatigue, fever and unexpected weight change.  Eyes: Negative for visual disturbance.       Negative for changes in vision  Respiratory: Negative for cough, chest tightness, shortness of breath and wheezing.   Cardiovascular: Negative for chest pain, palpitations and leg swelling.  Gastrointestinal: Negative for abdominal pain, constipation, diarrhea, nausea and vomiting.  Genitourinary: Negative for dysuria, flank pain, frequency, genital sores, hematuria and urgency.  Skin: Negative for rash.  Allergic/Immunologic: Negative for immunocompromised state.  Neurological: Negative for dizziness, weakness, light-headedness and headaches.      Objective:    BP (!) 188/117   Pulse 68   Temp 97.7 F (36.5 C) (Oral)   Ht 5\' 5"  (1.651 m)   Wt 124 lb (56.2 kg)   BMI 20.63 kg/m  Nursing note and vital signs reviewed.  Physical Exam  Constitutional: He appears well-developed and well-nourished. No distress.  Cardiovascular: Normal rate, regular rhythm, normal heart sounds and intact distal pulses.  Pulmonary/Chest: Effort normal and breath sounds normal. No respiratory distress. He has no wheezes. He has no rales. He exhibits no tenderness.  Neurological: He is alert. He is disoriented.  Skin: Skin is warm and dry.  Psychiatric: He has a normal mood and affect.       Assessment & Plan:   Problem List Items Addressed This Visit      Cardiovascular and Mediastinum   Essential hypertension (Chronic)    Mr. Kingsley continues to have poorly controlled hypertension with his current regimen. This does place him at significant risk for future strokes and coronary artery disease. His kidney function appears stable currently. He would benefit from a visit to an ophthalmologist to check for any hypertensive related damage to his eyes. Discussed the importance of reducing carbohydrates and processed foods. Will continue current dose of labetolol  and amlodipine and add furosemide secondary to his kidney function. He will return in a week for blood pressure check and to have lab work checked.       Relevant Medications   furosemide (LASIX) 20 MG tablet     Other   Human immunodeficiency virus (HIV) disease (HCC) - Primary (Chronic)    Peter Cox appears to have improved control of his HIV disease with his most recent viral load of 20. Family affirms his adherence to the medication regimen with no adverse side effects. He has no problems obtaining his medication. Influenza and Pneumovax updated today. Future care is a concern as he likely has progressing dementia related to HIV and previous alcohol consumption. He has no signs/symptoms of opportunistic infection through history or physical exam. Continue current dose of Biktarvy. Follow up in 1 month or sooner if needed. Will speak with [name] about the possibilities of him obtaining senior care or adult care during the day.  Relevant Orders   Comprehensive metabolic panel   HIV 1 RNA quant-no reflex-bld   T-helper cell (CD4)- ([facility] clinic only)   RPR   Lipid panel   Pneumococcal polysaccharide vaccine 23-valent greater than or equal to 2yo subcutaneous/IM (Completed)    Other Visit Diagnoses    Need for pneumococcal vaccination       Relevant Orders   Pneumococcal polysaccharide vaccine 23-valent greater than or equal to 2yo subcutaneous/IM (Completed)   Need for immunization against influenza       Relevant Orders   Flu Vaccine QUAD 36+ mos IM (Completed)       I am having Peter Cox start on furosemide. I am also having him maintain his escitalopram, bictegravir-emtricitabine-tenofovir AF, atorvastatin, amLODipine, thiamine, labetalol, and folic acid.   Meds ordered this encounter  Medications  . furosemide (LASIX) 20 MG tablet    Sig: Take 0.5 tablets (10 mg total) by mouth daily.    Dispense:  30 tablet    Refill:  0    Please cut for patient    Order  Specific Question:   Supervising Provider    Answer:   Carlyle Basques [[id number]]     Follow-up: Return in about 1 month (around 04/18/2018), or if symptoms worsen or fail to improve. Nurse visit in 1 week to check labs and for blood pressure check.    Terri Piedra, MSN, FNP-C Nurse Practitioner [facility] [place] [facility] Office phone: [phone] Pager: [place] number: [phone]

## 2018-03-20 ENCOUNTER — Encounter: Payer: Self-pay | Admitting: Family

## 2018-03-20 NOTE — Assessment & Plan Note (Signed)
Peter Cox continues to have poorly controlled hypertension with his current regimen. This does place him at significant risk for future strokes and coronary artery disease. His kidney function appears stable currently. He would benefit from a visit to an ophthalmologist to check for any hypertensive related damage to his eyes. Discussed the importance of reducing carbohydrates and processed foods. Will continue current dose of labetolol and amlodipine and add furosemide secondary to his kidney function. He will return in a week for blood pressure check and to have lab work checked.

## 2018-03-20 NOTE — Assessment & Plan Note (Addendum)
Mr. Cannata appears to have improved control of his HIV disease with his most recent viral load of 20. Family affirms his adherence to the medication regimen with no adverse side effects. He has no problems obtaining his medication. Influenza and Pneumovax updated today. Future care is a concern as he likely has progressing dementia related to HIV and previous alcohol consumption. He has no signs/symptoms of opportunistic infection through history or physical exam. Continue current dose of Biktarvy. Follow up in 1 month or sooner if needed. Will speak with [name] about the possibilities of him obtaining senior care or adult care during the day.

## 2018-03-27 ENCOUNTER — Other Ambulatory Visit: Payer: Medicare Other

## 2018-03-30 ENCOUNTER — Other Ambulatory Visit: Payer: Self-pay | Admitting: *Deleted

## 2018-03-30 ENCOUNTER — Telehealth: Payer: Self-pay | Admitting: *Deleted

## 2018-03-30 NOTE — Telephone Encounter (Signed)
Order received on 01/03/18 by Dr. Tommy Medal  to evaluate patient for [facility]).  Patient was evaluated on 01/03/18 for CBHCNS. Patient was consented to care at this time.   Frequency / Duration of CBHCN visits: Effective: 6mo1, 12mo1, 62mo1, 54mo1,  4 PRN's for complications with disease process/progression, medication changes or concerns   Initial Assessment Points to Consider for Care  Points are not all inclusive to services and educated provided but supports the patient's Individualized Plan of Care.  . Is Home safe for visits? Yes .  Are all firearms or weapons secure? Yes . Insurance Coverage: MCD/MCR . 1st HIV Diagnosis: 2010 . Mode of HIV Transmission: possible IV drug use and sexual contact . Functional Status: WNL . Current Housing/Needs: lives with his dtr in [place] but has a subsidized apartment in [place] . Social Support/System:in place . Culture/Religion/Spirituality: will not interfere with the plan of care . Educational Background: limited literacy . Legal Issues: none noted . Access/Utilization of Community Resources: able to access resources . Mental Health Concerns/Diagnosis: Depression related to the lost of his long time partner (non HIV related death) . Alcohol and/or Drug PPI:RJJOACZ, marijuana, hx of cocaine "crack" use . Risk and Knowledge of HIV and Reduction in Transmission: limited knowledge and understanding about the need to adhere to his medications . Nutritional Needs: will continue to be assessed but no current needs at this time   [facility] will assess for learning needs related to diagnosis and treatment regimen, provide education as needed, fill pill box if needed, address any barriers which may be preventing medication compliance, and communicating with care team including physician and case manager.   Individualized Plan Of Care effective 04/04/18 to 07/03/18  a. Type of service(s) and care to be delivered:  RN Case Management  b. Frequency and duration of service: Effective 03/30/18 82mo1, 5mo1, 81mo1, 59mo1, 4 prns for complications with disease process/progression, medication changes or concerns . Visits/Contact may be conducted telephonically or in person to best suit the patient.  c. Activity restrictions: Pt may be up as tolerated and can safely ambulate without the need for a assistive device   d. Safety Measures: Standard Precautions/Infection Control, fall prevention  e. Service Objectives and Goals: Service Objectives are to assist the pt with HIV medication regimen adherence and staying in care with the Infectious Disease Clinic by identifying barriers to care. RN will address the barriers that are identified by the patient. Patient centered goals is to take his medications and not have to be hospitalized   f. Equipment required: No additional equipment needs at this time   g. Functional Limitations: No concerns noted  h. Rehabilitation potential: Guarded   i. Diet and Nutritional Needs: Regular Diet, further nutritional assessment to be provided  j. Medications and treatments: Medications have been reconciled and reviewed and are a part of EPIC electronic file   k. Specific therapies if needed: RN   l. Pertinent diagnoses: HIV disease,  Hx of medication NonCompliance, Hx of stroke, HTN, stage 2 kidney disease  m. Expected outcome: Guarded

## 2018-04-12 MED FILL — AMLODIPINE BESYLATE 10 MG T: 10 | 30 days supply | Qty: 30 | Fill #4

## 2018-04-12 MED FILL — LABETALOL HCL 100 MG TABS: 100 | 30 days supply | Qty: 60 | Fill #4

## 2018-04-12 MED FILL — BIKTARVY 50-200-25 MG TABS: 50-200-25 | 30 days supply | Qty: 30 | Fill #4

## 2018-04-12 MED FILL — ATORVASTATIN CALCIUM 20 MG: 20 | 30 days supply | Qty: 30 | Fill #3

## 2018-04-12 MED FILL — FUROSEMIDE 20 MG TABS: 20 | 30 days supply | Qty: 15 | Fill #1

## 2018-04-17 ENCOUNTER — Encounter: Payer: Self-pay | Admitting: Family

## 2018-04-17 ENCOUNTER — Ambulatory Visit (INDEPENDENT_AMBULATORY_CARE_PROVIDER_SITE_OTHER): Payer: Medicare Other | Admitting: Family

## 2018-04-17 ENCOUNTER — Ambulatory Visit: Payer: Medicaid Other | Admitting: Internal Medicine

## 2018-04-17 VITALS — BP 190/100 | HR 59 | Temp 97.6°F | Ht 66.0 in | Wt 128.0 lb

## 2018-04-17 DIAGNOSIS — B20 Human immunodeficiency virus [HIV] disease: Secondary | ICD-10-CM

## 2018-04-17 DIAGNOSIS — I10 Essential (primary) hypertension: Secondary | ICD-10-CM | POA: Diagnosis not present

## 2018-04-17 DIAGNOSIS — Z7189 Other specified counseling: Secondary | ICD-10-CM | POA: Insufficient documentation

## 2018-04-17 DIAGNOSIS — Z0000 Encounter for general adult medical examination without abnormal findings: Secondary | ICD-10-CM | POA: Insufficient documentation

## 2018-04-17 MED ORDER — FUROSEMIDE 20 MG PO TABS: 10.0000 mg | ORAL_TABLET | Freq: Every day | ORAL | 0 refills | Status: DC

## 2018-04-17 NOTE — Assessment & Plan Note (Signed)
I have reviewed and agree with the goals and plan of care established by Kinnie Scales, RN. She is providing an invaluable service to Mr. Laverdure and I appreciate her assistance in caring for Mr. Stidham.

## 2018-04-17 NOTE — Assessment & Plan Note (Signed)
Peter Cox indicates he has been adherent to his medication regimen and missed 2 doses since his last office visit.  No adverse side effects.  He has no signs/symptoms of opportunistic infection.  He is mentally alert and oriented to person, place and time.  All immunizations are up-to-date per recommendations.  Check CD4 count and viral load today.  Continue current dose of Biktarvy.  Plan for continued close follow-up in 1 month or sooner if needed.

## 2018-04-17 NOTE — Patient Instructions (Addendum)
Nice to see you.  We will check your blood work today.  Continue to take you Biktarvy, Amlodipine, and Labetolol for your blood pressure.   Monitor your blood pressure at home as able.   Follow up in 1 month or sooner if needed.

## 2018-04-17 NOTE — Progress Notes (Signed)
Subjective:    Patient ID: Peter Cox, male    DOB: [birth year], 57 y.o.   MRN: [id number]  Chief Complaint  Patient presents with  . Follow-up    HIV     HPI:  Peter Cox is a 57 y.o. male who presents today for routine follow-up of HIV disease.  [name] was last seen on 03/19/2018 for routine follow-up of HIV disease and hypertension.  His compliance had increased with no missed doses of his antiretroviral regimen consisting of Biktarvy.  Family expressed concern with need for possibility of a sitter for 2 to 3 hours/day due to his dementia.  At that time his viral load was suppressed and undetectable with a CD4 count of 270.  His blood pressure remained poorly controlled and was continued on his current dose of labetalol and amlodipine with the addition of low-dose furosemide due to his decreasing kidney function.  Unfortunately he did not follow-up for lab work as instructed.  All immunizations are up-to-date per recommendations currently. Per phone notes, he apparently has not been taking his medications as he left home from 10/3 and returned on 10/10.  He reports taking both his HIV and hypertensive medications as prescribed and has missed 2 days of medication. No adverse side effects. Denies any recreational or illicit drugs. He has no problems obtaining his medication. Continues to live with his daughter. Has taken his medication today.   Denies fevers, chills, night sweats, chest pain, headaches, changes in vision, neck pain/stiffness, nausea, diarrhea, vomiting, lesions or rashes.    No Known Allergies    Outpatient Medications Prior to Visit  Medication Sig Dispense Refill  . amLODipine (NORVASC) 10 MG tablet Take 1 tablet (10 mg total) by mouth daily. 30 tablet 11  . atorvastatin (LIPITOR) 20 MG tablet Take 1 tablet (20 mg total) by mouth daily at 6 PM. 30 tablet 11  . bictegravir-emtricitabine-tenofovir AF (BIKTARVY) 50-200-25 MG TABS tablet Take 1 tablet by  mouth daily. 30 tablet 11  . escitalopram (LEXAPRO) 10 MG tablet Take 1 tablet (10 mg total) by mouth daily. 30 tablet 0  . folic acid (FOLVITE) 1 MG tablet Take 1 tablet (1 mg total) by mouth daily. 30 tablet 11  . labetalol (NORMODYNE) 100 MG tablet Take 1 tablet (100 mg total) by mouth 2 (two) times daily. 60 tablet 5  . thiamine 100 MG tablet Take 1 tablet (100 mg total) by mouth daily. 30 tablet 11  . furosemide (LASIX) 20 MG tablet Take 0.5 tablets (10 mg total) by mouth daily. 30 tablet 0   No facility-administered medications prior to visit.      Past Medical History:  Diagnosis Date  . Alcohol abuse    heavy, clean since April  . Alcohol abuse 11/24/2014  . Cocaine abuse ([place])     clean since about 2000  . Drug abuse ([place]) 11/24/2014  . HIV infection ([place])    dx 08/08/2008 (unknown how he was exposed)  Initial VL 43,200 and CD4 410 on 08/28/08  . Hyperlipidemia   . Hypertension   . Marijuana abuse   . Mild cognitive impairment    likely vascular dementia/psa/?HIV componentCT Head 02/12/08 for htn crisis and concern for stroke: impression-1. Old right internal capsule and thalamic lacunar infarcts. 2. Mild to moderate chronic small vessell white matter ischemic changes in both cerebral hemisphere, greater on the right  . Neuromuscular disorder ([place])   . Seizures ([place])    "last year had seizure"  .  Stroke ([place])   . Tobacco abuse      Past Surgical History:  Procedure Laterality Date  . CLOSED REDUCTION PATELLAR     right knee  . HEMORRHOID SURGERY    . SHOULDER ARTHROSCOPY  01/2008   w/extensive debridement (Dr Mardelle Matte)       Review of Systems  Constitutional: Negative for appetite change, chills, fatigue, fever and unexpected weight change.  Eyes: Negative for visual disturbance.       Negative for changes in vision  Respiratory: Negative for cough, chest tightness, shortness of breath and wheezing.   Cardiovascular: Negative for chest pain, palpitations and leg  swelling.  Gastrointestinal: Negative for abdominal pain, constipation, diarrhea, nausea and vomiting.  Genitourinary: Negative for dysuria, flank pain, frequency, genital sores, hematuria and urgency.  Skin: Negative for rash.  Allergic/Immunologic: Negative for immunocompromised state.  Neurological: Negative for dizziness, weakness, light-headedness and headaches.      Objective:    BP (!) 190/100   Pulse (!) 59   Temp 97.6 F (36.4 C)   Ht 5\' 6"  (1.676 m)   Wt 128 lb (58.1 kg)   BMI 20.66 kg/m  Nursing note and vital signs reviewed.  Physical Exam  Constitutional: He is oriented to person, place, and time. He appears well-developed. No distress.  HENT:  Mouth/Throat: Oropharynx is clear and moist.  Eyes: Conjunctivae are normal.  Neck: Neck supple.  Cardiovascular: Normal rate, regular rhythm, normal heart sounds and intact distal pulses. Exam reveals no gallop and no friction rub.  No murmur heard. Pulmonary/Chest: Effort normal and breath sounds normal. No respiratory distress. He has no wheezes. He has no rales. He exhibits no tenderness.  Abdominal: Soft. Bowel sounds are normal. There is no tenderness.  Lymphadenopathy:    He has no cervical adenopathy.  Neurological: He is alert and oriented to person, place, and time.  Skin: Skin is warm and dry. No rash noted.  Psychiatric: He has a normal mood and affect.       Assessment & Plan:   Problem List Items Addressed This Visit      Cardiovascular and Mediastinum   Essential hypertension (Chronic)    Blood pressure remains significantly elevated with no headaches, chest pain or changes in vision currently. I have concern that he is using cocaine again as he has disappeared from his home for over 1 week and his poorly controlled blood pressure. Check CMP today. Continue to monitor blood pressure at home. Advised to seek care if worst headache of life develops. Continue current dose of furosemide, amlodipine and  labetalol.       Relevant Medications   furosemide (LASIX) 20 MG tablet   Other Relevant Orders   Comprehensive metabolic panel     Other   Human immunodeficiency virus (HIV) disease ([place]) - Primary (Chronic)    Mr. Jorge indicates he has been adherent to his medication regimen and missed 2 doses since his last office visit.  No adverse side effects.  He has no signs/symptoms of opportunistic infection.  He is mentally alert and oriented to person, place and time.  All immunizations are up-to-date per recommendations.  Check CD4 count and viral load today.  Continue current dose of Biktarvy.  Plan for continued close follow-up in 1 month or sooner if needed.      Relevant Orders   T-helper cell (CD4)- ([facility] clinic only)   HIV-1 RNA ultraquant reflex to gentyp+   Goals of care, counseling/discussion    I have  reviewed and agree with the goals and plan of care established by Kinnie Scales, RN. She is providing an invaluable service to Mr. Langland and I appreciate her assistance in caring for Mr. Torok.           I am having [place] Wert. Sivertson maintain his escitalopram, bictegravir-emtricitabine-tenofovir AF, atorvastatin, amLODipine, thiamine, labetalol, folic acid, and furosemide.   Meds ordered this encounter  Medications  . furosemide (LASIX) 20 MG tablet    Sig: Take 0.5 tablets (10 mg total) by mouth daily.    Dispense:  30 tablet    Refill:  0    Please cut for patient    Order Specific Question:   Supervising Provider    Answer:   Carlyle Basques [[id number]]     Follow-up: Return in about 1 month (around 05/18/2018), or if symptoms worsen or fail to improve.   Terri Piedra, MSN, FNP-C Nurse Practitioner [facility] [place] [facility] Office phone: [phone] Pager: [place] number: [phone]

## 2018-04-17 NOTE — Assessment & Plan Note (Signed)
Blood pressure remains significantly elevated with no headaches, chest pain or changes in vision currently. I have concern that he is using cocaine again as he has disappeared from his home for over 1 week and his poorly controlled blood pressure. Check CMP today. Continue to monitor blood pressure at home. Advised to seek care if worst headache of life develops. Continue current dose of furosemide, amlodipine and labetalol.

## 2018-04-18 LAB — T-HELPER CELL (CD4) - (RCID CLINIC ONLY)
CD4 % Helper T Cell: 24 % — ABNORMAL LOW (ref 33–55)
CD4 T Cell Abs: 430 /uL (ref 400–2700)

## 2018-04-21 LAB — COMPREHENSIVE METABOLIC PANEL
AG Ratio: 1 (calc) (ref 1.0–2.5)
ALT: 12 U/L (ref 9–46)
AST: 16 U/L (ref 10–35)
Albumin: 3.7 g/dL (ref 3.6–5.1)
Alkaline phosphatase (APISO): 102 U/L (ref 40–115)
BUN/Creatinine Ratio: 12 (calc) (ref 6–22)
BUN: 27 mg/dL — ABNORMAL HIGH (ref 7–25)
CO2: 27 mmol/L (ref 20–32)
Calcium: 9.4 mg/dL (ref 8.6–10.3)
Chloride: 108 mmol/L (ref 98–110)
Creat: 2.19 mg/dL — ABNORMAL HIGH (ref 0.70–1.33)
Globulin: 3.7 g/dL (calc) (ref 1.9–3.7)
Glucose, Bld: 77 mg/dL (ref 65–99)
Potassium: 5 mmol/L (ref 3.5–5.3)
Sodium: 142 mmol/L (ref 135–146)
Total Bilirubin: 0.4 mg/dL (ref 0.2–1.2)
Total Protein: 7.4 g/dL (ref 6.1–8.1)

## 2018-04-21 LAB — HIV-1 RNA ULTRAQUANT REFLEX TO GENTYP+
HIV 1 RNA Quant: <20 copies/mL
HIV-1 RNA Quant, Log: <1.3 Log copies/mL

## 2018-04-26 ENCOUNTER — Telehealth: Payer: Self-pay | Admitting: Behavioral Health

## 2018-04-26 NOTE — Telephone Encounter (Signed)
Attempted to call patient, however male voice answered the phone and states Mr. Hermann was at home and he does not have a phone.  Informed her would try again later.  Did not give out any patient information over the phone. Pricilla Riffle RN
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
## 2018-04-26 NOTE — Telephone Encounter (Signed)
-----   Message from Golden Circle, [place] sent at 04/24/2018 12:59 PM EST ----- Please inform Peter Cox that his viral load is undetectable and CD4 count is 430. His remaining blood work has been consistent with previous results and looks stable. Continue Biktarvy and follow up as scheduled.

## 2018-05-21 ENCOUNTER — Ambulatory Visit: Payer: Medicare Other | Admitting: Family

## 2018-07-09 ENCOUNTER — Telehealth: Payer: Self-pay | Admitting: *Deleted

## 2018-07-09 NOTE — Telephone Encounter (Signed)
Initial Order received on 01/03/18 by Dr. Tommy Medal  to evaluate patient for [facility]).  Patient was evaluated on 01/03/18 for [facility]HCNS. Patient was consented to care at this time.   Frequency / Duration of CBHCN visits: Effective 07/04/18: 73mo4,  4 PRN's for complications with disease process/progression, medication changes or concerns   Initial Assessment Points to Consider for Care  Points are not all inclusive to services and educated provided but supports the patient's Individualized Plan of Care.  . Is Home safe for visits? Yes .  Are all firearms or weapons secure? Yes . Insurance Coverage: MCD/MCR . 1st HIV Diagnosis: 2010 . Mode of HIV Transmission: possible IV drug use and sexual contact . Functional Status: WNL . Current Housing/Needs: lives with his dtr in [place] but has a subsidized apartment in [place] . Social Support/System:in place . Culture/Religion/Spirituality: will not interfere with the plan of care . Educational Background: limited literacy . Legal Issues: none noted . Access/Utilization of Community Resources: able to access resources . Mental Health Concerns/Diagnosis: Depression related to the lost of his long time partner (non HIV related death) . Alcohol and/or Drug RCB:ULAGTXM, marijuana, hx of cocaine "crack" use . Risk and Knowledge of HIV and Reduction in Transmission: limited knowledge and understanding about the need to adhere to his medications . Nutritional Needs: will continue to be assessed but no current needs at this time   [facility] will assess for learning needs related to diagnosis and treatment regimen, provide education as needed, fill pill box if needed, address any barriers which may be preventing medication compliance, and communicating with care team including physician and case manager.   Individualized Plan Of Care effective 07/04/17 to 10/02/18  a. Type of service(s) and care to be delivered: RN  Case Management  b. Frequency and duration of service: Effective 07/04/18, 37mo4, 4 prns for complications with disease process/progression, medication changes or concerns . Visits/Contact may be conducted telephonically or in person to best suit the patient.  c. Activity restrictions: Pt may be up as tolerated and can safely ambulate without the need for a assistive device   d. Safety Measures: Standard Precautions/Infection Control, fall prevention  e. Service Objectives and Goals: Service Objectives are to assist the pt with HIV medication regimen adherence and staying in care with the Infectious Disease Clinic by identifying barriers to care. RN will address the barriers that are identified by the patient. Patient centered goals is to take his medications and not have to be hospitalized   f. Equipment required: No additional equipment needs at this time   g. Functional Limitations: No concerns noted  h. Rehabilitation potential: Guarded   i. Diet and Nutritional Needs: Regular Diet, further nutritional assessment to be provided  j. Medications and treatments: Medications have been reconciled and reviewed and are a part of EPIC electronic file   k. Specific therapies if needed: RN   l. Pertinent diagnoses: HIV disease,  Hx of medication NonCompliance, Hx of stroke, HTN, stage 2 kidney disease  m. Expected outcome: Guarded

## 2018-07-10 NOTE — Telephone Encounter (Signed)
I definitely approve this plan of care

## 2018-07-12 ENCOUNTER — Ambulatory Visit: Payer: Medicare Other | Admitting: Family

## 2018-08-23 ENCOUNTER — Emergency Department
Admission: EM | Admit: 2018-08-23 | Discharge: 2018-08-23 | Disposition: A | Discharge status: Home / Self Care | Payer: Medicare HMO | Attending: Emergency Medicine | Admitting: Emergency Medicine

## 2018-08-23 ENCOUNTER — Other Ambulatory Visit: Payer: Self-pay

## 2018-08-23 ENCOUNTER — Encounter: Payer: Self-pay | Admitting: Emergency Medicine

## 2018-08-23 DIAGNOSIS — M25561 Pain in right knee: Secondary | ICD-10-CM | POA: Insufficient documentation

## 2018-08-23 DIAGNOSIS — F0390 Unspecified dementia without behavioral disturbance: Secondary | ICD-10-CM | POA: Insufficient documentation

## 2018-08-23 DIAGNOSIS — N182 Chronic kidney disease, stage 2 (mild): Secondary | ICD-10-CM | POA: Diagnosis not present

## 2018-08-23 DIAGNOSIS — F17210 Nicotine dependence, cigarettes, uncomplicated: Secondary | ICD-10-CM | POA: Insufficient documentation

## 2018-08-23 DIAGNOSIS — I10 Essential (primary) hypertension: Secondary | ICD-10-CM

## 2018-08-23 DIAGNOSIS — Z79899 Other long term (current) drug therapy: Secondary | ICD-10-CM | POA: Diagnosis not present

## 2018-08-23 DIAGNOSIS — I129 Hypertensive chronic kidney disease with stage 1 through stage 4 chronic kidney disease, or unspecified chronic kidney disease: Secondary | ICD-10-CM | POA: Insufficient documentation

## 2018-08-23 MED ORDER — TRAMADOL HCL 50 MG PO TABS: 50.0000 mg | ORAL_TABLET | Freq: Four times a day (QID) | ORAL | 0 refills | Status: DC | PRN

## 2018-08-23 NOTE — ED Triage Notes (Signed)
PT c/o RT leg pain, states " for years" but worsening "lately". Denies any acute injury. NAD ntoed

## 2018-08-23 NOTE — Discharge Instructions (Addendum)
Please call and schedule a follow up with orthopedics.  Also, take your blood pressure medications as soon as you get home. Follow up with primary care for a recheck.  Return to the ER for symptoms that change or worsen.

## 2018-08-23 NOTE — ED Notes (Signed)
Knee immobilizer placed and pt declined w/c and walked to exit.

## 2018-08-23 NOTE — ED Provider Notes (Signed)
[facility] Emergency Department Provider Note ____________________________________________  Time seen: Approximately 1:32 PM  I have reviewed the triage vital signs and the nursing notes.   HISTORY  Chief Complaint Leg Pain    HPI Peter Cox is a 58 y.o. male who presents to the emergency department for evaluation and treatment of right knee pain that is acute on chronic. Pain is worse with weight bearing. No recent injury. No alleviating measures prior to arrival.   Past Medical History:  Diagnosis Date  . Alcohol abuse    heavy, clean since April  . Alcohol abuse 11/24/2014  . Cocaine abuse ([place])     clean since about 2000  . Drug abuse ([place]) 11/24/2014  . HIV infection ([place])    dx 08/08/2008 (unknown how he was exposed)  Initial VL 43,200 and CD4 410 on 08/28/08  . Hyperlipidemia   . Hypertension   . Marijuana abuse   . Mild cognitive impairment    likely vascular dementia/psa/?HIV componentCT Head 02/12/08 for htn crisis and concern for stroke: impression-1. Old right internal capsule and thalamic lacunar infarcts. 2. Mild to moderate chronic small vessell white matter ischemic changes in both cerebral hemisphere, greater on the right  . Neuromuscular disorder ([place])   . Seizures ([place])    "last year had seizure"  . Stroke ([place])   . Tobacco abuse     Patient Active Problem List   Diagnosis Date Noted  . Goals of care, counseling/discussion 04/17/2018  . Cocaine abuse ([place]) 08/23/2017  . Drug abuse ([place]) 11/24/2014  . Alcohol abuse 11/24/2014  . 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 CVA (cerebral infarction) 07/02/2014  . Depression 04/30/2014  . Marijuana intoxication ([place]) 03/27/2013  . CKD stage 2 11/20/2008  . Human immunodeficiency virus (HIV) disease ([place]) 08/17/2008  . DYSLIPIDEMIA 06/26/2008  . Essential hypertension 06/24/2008    Past Surgical History:  Procedure Laterality Date  . CLOSED  REDUCTION PATELLAR     right knee  . HEMORRHOID SURGERY    . SHOULDER ARTHROSCOPY  01/2008   w/extensive debridement (Dr Mardelle Matte)    Prior to Admission medications   Medication Sig Start Date End Date Taking? Authorizing Provider  amLODipine (NORVASC) 10 MG tablet Take 1 tablet (10 mg total) by mouth daily. 01/03/18   Truman Hayward, MD  atorvastatin (LIPITOR) 20 MG tablet Take 1 tablet (20 mg total) by mouth daily at 6 PM. 01/03/18   Tommy Medal, Lavell Islam, MD  bictegravir-emtricitabine-tenofovir AF (BIKTARVY) 50-200-25 MG TABS tablet Take 1 tablet by mouth daily. 01/03/18   Truman Hayward, MD  escitalopram (LEXAPRO) 10 MG tablet Take 1 tablet (10 mg total) by mouth daily. 08/25/17   Kayleen Memos, DO  folic acid (FOLVITE) 1 MG tablet Take 1 tablet (1 mg total) by mouth daily. 01/03/18   Truman Hayward, MD  furosemide (LASIX) 20 MG tablet Take 0.5 tablets (10 mg total) by mouth daily. 04/17/18   Golden Circle, FNP  labetalol (NORMODYNE) 100 MG tablet Take 1 tablet (100 mg total) by mouth 2 (two) times daily. 01/03/18   Truman Hayward, MD  thiamine 100 MG tablet Take 1 tablet (100 mg total) by mouth daily. 01/03/18   Truman Hayward, MD  traMADol (ULTRAM) 50 MG tablet Take 1 tablet (50 mg total) by mouth every 6 (six) hours as needed. 08/23/18   Victorino Dike, FNP  Allergies Patient has no known allergies.  Family History  Problem Relation Age of Onset  . Diabetes Mother   . Hypertension Mother   . Stroke Mother   . Diabetes Father   . Hypertension Sister   . Hypertension Sister   . Colon cancer Neg Hx     Social History Social History   Tobacco Use  . Smoking status: Current Every Day Smoker    Packs/day: 0.50    Years: 40.00    Pack years: 20.00    Types: Cigarettes  . Smokeless tobacco: Never Used  . Tobacco comment: Quit  x 2 week.  Substance Use Topics  . Alcohol use: Yes    Alcohol/week: 0.0 standard drinks    Comment: drink beer  .  Drug use: Yes    Frequency: 7.0 times per week    Types: Marijuana    Comment: use Marijuana every day per pt.    Review of Systems Constitutional: Negative for fever. Cardiovascular: Negative for chest pain. Respiratory: Negative for shortness of breath. Musculoskeletal: Positive for right knee pain. Skin: Negative for open wound or lesion.  Neurological: Negative for decrease in sensation  ____________________________________________   PHYSICAL EXAM:  VITAL SIGNS: ED Triage Vitals  Enc Vitals Group     BP 08/23/18 1136 (!) 194/108     Pulse Rate 08/23/18 1133 72     Resp 08/23/18 1132 16     Temp 08/23/18 1132 98 F (36.7 C)     Temp Source 08/23/18 1132 Oral     SpO2 08/23/18 1133 100 %     Weight --      Height --      Head Circumference --      Peak Flow --      Pain Score 08/23/18 1132 5     Pain Loc --      Pain Edu? --      Excl. in [place]? --     Constitutional: Alert and oriented. Well appearing and in no acute distress. Eyes: Conjunctivae are clear without discharge or drainage Head: Atraumatic Neck: Supple Respiratory: No cough. Respirations are even and unlabored. Musculoskeletal: Right knee without deformity. Able to perform straight leg raise. Observed ambulating unassisted with antalgic gait. Neurologic: Motor and sensory function is intact.  Skin: Intact over right knee.  Psychiatric: Affect and behavior are appropriate.  ____________________________________________   LABS (all labs ordered are listed, but only abnormal results are displayed)  Labs Reviewed - No data to display ____________________________________________  RADIOLOGY  Not indicated. ____________________________________________   PROCEDURES  .Ortho Injury Treatment Date/Time: 08/23/2018 6:02 PM Performed by: Victorino Dike, FNP Authorized by: Victorino Dike, FNP   Consent:    Consent obtained:  Verbal   Consent given by:  Patient   Risks discussed:  StiffnessInjury  location: knee Location details: right knee Injury type: acute on chronic pain. Pre-procedure neurovascular assessment: neurovascularly intact Splint type: knee immobilizer. Supplies used: prefabricated splint. Post-procedure neurovascular assessment: post-procedure neurovascularly intact     ____________________________________________   INITIAL IMPRESSION / ASSESSMENT AND PLAN / ED COURSE  ANGEL WEEDON is a 58 y.o. who presents to the emergency department for evaluation of acute on chronic right knee pain.  No recent injury.  Pain has been increasing over the past couple of days.  He has been using a cane, but states that any weightbearing painful.  No alleviating measures attempted prior to arrival.  Also of note, the patient is hypertensive.  He has  not taken his medications today.  He denies any symptoms of concern such as chest pain, shortness of breath, headache, vision changes.  He was strongly advised to take his medications as prescribed and verbalizes intent to take them when he gets home.  For the knee pain, he was encouraged to schedule follow-up appointment with orthopedics.  Today, he will be treated with tramadol since he is unable to take NSAIDs secondary to his HIV treatment.  He was encouraged to return to the emergency department for symptoms of change or worsen if he is unable to schedule appointment with orthopedics or primary care.   Medications - No data to display  Pertinent labs & imaging results that were available during my care of the patient were reviewed by me and considered in my medical decision making (see chart for details).  _________________________________________   FINAL CLINICAL IMPRESSION(S) / ED DIAGNOSES  Final diagnoses:  Right anterior knee pain  Hypertension, unspecified type    ED Discharge Orders         Ordered    traMADol (ULTRAM) 50 MG tablet  Every 6 hours PRN     08/23/18 1351           If controlled substance  prescribed during this visit, 12 month history viewed on the [facility] prior to issuing an initial prescription for Schedule II or III opiod.    Victorino Dike, FNP 08/23/18 1805    Earleen Newport, MD 08/24/18 2047

## 2018-08-23 NOTE — ED Notes (Signed)
Pt hx of HTN , states he did not take his medicine today

## 2018-08-30 ENCOUNTER — Other Ambulatory Visit: Payer: Medicare HMO

## 2018-08-30 ENCOUNTER — Other Ambulatory Visit: Payer: Self-pay

## 2018-08-31 ENCOUNTER — Other Ambulatory Visit: Payer: Medicare HMO

## 2018-09-04 ENCOUNTER — Other Ambulatory Visit: Payer: Self-pay

## 2018-09-04 ENCOUNTER — Other Ambulatory Visit: Payer: Medicare HMO

## 2018-09-04 DIAGNOSIS — B20 Human immunodeficiency virus [HIV] disease: Secondary | ICD-10-CM

## 2018-09-05 LAB — T-HELPER CELL (CD4) - (RCID CLINIC ONLY)
CD4 % Helper T Cell: 25 % — ABNORMAL LOW (ref 33–55)
CD4 T Cell Abs: 310 /uL — ABNORMAL LOW (ref 400–2700)

## 2018-09-06 LAB — COMPREHENSIVE METABOLIC PANEL
AG Ratio: 1.3 (calc) (ref 1.0–2.5)
ALT: 13 U/L (ref 9–46)
AST: 18 U/L (ref 10–35)
Albumin: 3.9 g/dL (ref 3.6–5.1)
Alkaline phosphatase (APISO): 86 U/L (ref 35–144)
BUN/Creatinine Ratio: 12 (calc) (ref 6–22)
BUN: 26 mg/dL — ABNORMAL HIGH (ref 7–25)
CO2: 27 mmol/L (ref 20–32)
Calcium: 9 mg/dL (ref 8.6–10.3)
Chloride: 106 mmol/L (ref 98–110)
Creat: 2.09 mg/dL — ABNORMAL HIGH (ref 0.70–1.25)
Globulin: 2.9 g/dL (calc) (ref 1.9–3.7)
Glucose, Bld: 74 mg/dL (ref 65–99)
Potassium: 4.3 mmol/L (ref 3.5–5.3)
Sodium: 141 mmol/L (ref 135–146)
Total Bilirubin: 0.4 mg/dL (ref 0.2–1.2)
Total Protein: 6.8 g/dL (ref 6.1–8.1)

## 2018-09-06 LAB — HIV-1 RNA QUANT-NO REFLEX-BLD
HIV 1 RNA Quant: <20 copies/mL
HIV-1 RNA Quant, Log: <1.3 Log copies/mL

## 2018-09-06 LAB — RPR: RPR Ser Ql: NONREACTIVE

## 2018-09-06 LAB — LIPID PANEL
Cholesterol: 194 mg/dL (ref <200)
HDL: 52 mg/dL (ref >=40)
LDL Cholesterol (Calc): 127 mg/dL (calc) — ABNORMAL HIGH
Non-HDL Cholesterol (Calc): 142 mg/dL (calc) — ABNORMAL HIGH (ref <130)
Total CHOL/HDL Ratio: 3.7 (calc) (ref <5.0)
Triglycerides: 61 mg/dL (ref <150)

## 2018-09-13 ENCOUNTER — Other Ambulatory Visit: Payer: Self-pay

## 2018-09-13 ENCOUNTER — Encounter: Payer: Self-pay | Admitting: Family

## 2018-09-13 ENCOUNTER — Ambulatory Visit (INDEPENDENT_AMBULATORY_CARE_PROVIDER_SITE_OTHER): Payer: Medicare HMO | Admitting: Family

## 2018-09-13 VITALS — BP 220/107 | HR 71 | Temp 97.9°F | Wt 131.0 lb

## 2018-09-13 DIAGNOSIS — F0390 Unspecified dementia without behavioral disturbance: Secondary | ICD-10-CM

## 2018-09-13 DIAGNOSIS — B20 Human immunodeficiency virus [HIV] disease: Secondary | ICD-10-CM

## 2018-09-13 DIAGNOSIS — M170 Bilateral primary osteoarthritis of knee: Secondary | ICD-10-CM | POA: Diagnosis not present

## 2018-09-13 DIAGNOSIS — Z789 Other specified health status: Secondary | ICD-10-CM

## 2018-09-13 MED ORDER — BICTEGRAVIR-EMTRICITAB-TENOFOV 50-200-25 MG PO TABS: 1.0000 | ORAL_TABLET | Freq: Every day | ORAL | 5 refills | Status: DC

## 2018-09-13 NOTE — Assessment & Plan Note (Signed)
Mr. Thorstenson has decreased ability to perform his ADL's with assistance needed with dressing, bathing, and preparing food which is likely from his dementia. He may benefit from occupational or physical therapy evaluation. Family requesting assistance with ADL's and will defer to primary care for available options.

## 2018-09-13 NOTE — Assessment & Plan Note (Signed)
Peter Cox has pain in his bilateral knees worse on the right that limits his ability to walk long distances and is requiring ambulation aid including a can at times. Will refer to orthopedics for further assessment and treatment.

## 2018-09-13 NOTE — Assessment & Plan Note (Signed)
Peter Cox has well controlled HIV disease with a viral load that was undetectable and CD4 count of 310 despite family stating that he may not be taking his medications. He appears to have worsening of his dementia which is likely multifactorial between HIV, history of substance abuse, and possibly vascular sources. Plan to continue current dose of Biktarvy at the present time. Discussed possible need for assisted living. Plan for follow up in 3 months or sooner if needed.

## 2018-09-13 NOTE — Assessment & Plan Note (Signed)
[name] appears to have worsening dementia that is likely multifactorial. Family is concerned about safety and requesting for assistance with CNA to aid him with activities of daily living. I will refer him to neurology to see if there are any options for potential treatment to help with his memory and function. His HIV is well controlled at present and will continue Biktarvy.

## 2018-09-13 NOTE — Patient Instructions (Addendum)
Nice to see you.  We will continue your current dose of Bijtarvy.  We will get you referred to Orthopedics and Neurology.  Please continue to follow up with primary care as scheduled.  We will plan to see you back in 3 months or sooner if needed with lab work 1-2 weeks prior to appointment.
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
## 2018-09-17 ENCOUNTER — Other Ambulatory Visit: Payer: Self-pay

## 2018-09-17 ENCOUNTER — Ambulatory Visit (INDEPENDENT_AMBULATORY_CARE_PROVIDER_SITE_OTHER): Payer: Medicare HMO | Admitting: Family Medicine

## 2018-09-17 ENCOUNTER — Encounter: Payer: Self-pay | Admitting: Family Medicine

## 2018-09-17 VITALS — BP 241/114 | HR 65 | Temp 97.7°F | Ht 65.0 in | Wt 131.2 lb

## 2018-09-17 DIAGNOSIS — I10 Essential (primary) hypertension: Secondary | ICD-10-CM

## 2018-09-17 DIAGNOSIS — M170 Bilateral primary osteoarthritis of knee: Secondary | ICD-10-CM

## 2018-09-17 DIAGNOSIS — N182 Chronic kidney disease, stage 2 (mild): Secondary | ICD-10-CM

## 2018-09-17 DIAGNOSIS — F1010 Alcohol abuse, uncomplicated: Secondary | ICD-10-CM

## 2018-09-17 DIAGNOSIS — B20 Human immunodeficiency virus [HIV] disease: Secondary | ICD-10-CM | POA: Diagnosis not present

## 2018-09-17 DIAGNOSIS — F1911 Other psychoactive substance abuse, in remission: Secondary | ICD-10-CM

## 2018-09-17 DIAGNOSIS — Z8673 Personal history of transient ischemic attack (TIA), and cerebral infarction without residual deficits: Secondary | ICD-10-CM

## 2018-09-17 DIAGNOSIS — F0280 Dementia in other diseases classified elsewhere without behavioral disturbance: Secondary | ICD-10-CM

## 2018-09-17 DIAGNOSIS — R32 Unspecified urinary incontinence: Secondary | ICD-10-CM

## 2018-09-17 DIAGNOSIS — Z789 Other specified health status: Secondary | ICD-10-CM

## 2018-09-17 DIAGNOSIS — E782 Mixed hyperlipidemia: Secondary | ICD-10-CM

## 2018-09-17 NOTE — Assessment & Plan Note (Signed)
Stable for many years Secondary to HIV and uncontrolled hypertension Creatinine is at baseline We will continue to monitor at least every 6 months Avoid nephrotoxic medications

## 2018-09-17 NOTE — Assessment & Plan Note (Signed)
Continue atorvastatin 20 mg daily As dementia gets more progressive, may consider whether there is actually value added from this medication His LDL was not at goal when last checked a few weeks ago, but he also has not been taking his medications regularly We will plan to recheck in about 6 months if he is taking his medication regularly at that point

## 2018-09-17 NOTE — Assessment & Plan Note (Signed)
Fairly progressive dementia likely secondary to history of CVA and substance abuse as well as HIV He is having a lot of difficulty with ADLs at home as well as with remembering to take his medications I believe he would benefit greatly from CCM services, including pharmacy, care manager, and social work This referral was placed today after discussing with his daughter His daughter may also benefit from social work in the setting of caregiver stress Referral was placed to neurology by ID recently Patient would also benefit from personal care services-we will see if CCM can get this started for him In the meantime, we will start home health services with nursing, nursing, physical therapy, Occupational Therapy

## 2018-09-17 NOTE — Assessment & Plan Note (Addendum)
Referral was placed orthopedics by ID last week Awaiting appointment Start home health physical therapy

## 2018-09-17 NOTE — Assessment & Plan Note (Signed)
Patient needs assistance from his daughter for dressing, bathing, and preparing food This is related to his advanced dementia Home health physical therapy, Occupational Therapy, nursing home nurse aide referral placed today Not ready to consider assisted living at this time
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
## 2018-09-17 NOTE — Patient Instructions (Signed)
Dementia Caregiver Guide Dementia is a term used to describe a number of symptoms that affect memory and thinking. The most common symptoms include:  Memory loss.  Trouble with language and communication.  Trouble concentrating.  Poor judgment.  Problems with reasoning.  Child-like behavior and language.  Extreme anxiety.  Angry outbursts.  Wandering from home or public places. Dementia usually gets worse slowly over time. In the early stages, people with dementia can stay independent and safe with some help. In later stages, they need help with daily tasks such as dressing, grooming, and using the bathroom. How to help the person with dementia cope Dementia can be frightening and confusing. Here are some tips to help the person with dementia cope with changes caused by the disease. General tips  Keep the person on track with his or her routine.  Try to identify areas where the person may need help.  Be supportive, patient, calm, and encouraging.  Gently remind the person that adjusting to changes takes time.  Help with the tasks that the person has asked for help with.  Keep the person involved in daily tasks and decisions as much as possible.  Encourage conversation, but try not to get frustrated or harried if the person struggles to find words or does not seem to appreciate your help. Communication tips  When the person is talking or seems frustrated, make eye contact and hold the person's hand.  Ask specific questions that need yes or no answers.  Use simple words, short sentences, and a calm voice. Only give one direction at a time.  When offering choices, limit them to just 1 or 2.  Avoid correcting the person in a negative way.  If the person is struggling to find the right words, gently try to help him or her. How to recognize symptoms of stress Symptoms of stress in caregivers include:  Feeling frustrated or angry with the person with dementia.   Denying that the person has dementia or that his or her symptoms will not improve.  Feeling hopeless and unappreciated.  Difficulty sleeping.  Difficulty concentrating.  Feeling anxious, irritable, or depressed.  Developing stress-related health problems.  Feeling like you have too little time for your own life. Follow these instructions at home:   Make sure that you and the person you are caring for: ? Get regular sleep. ? Exercise regularly. ? Eat regular, nutritious meals. ? Drink enough fluid to keep your urine clear or pale yellow. ? Take over-the-counter and prescription medicines only as told by your health care providers. ? Attend all scheduled health care appointments.  Join a support group with others who are caregivers.  Ask about respite care resources so that you can have a regular break from the stress of caregiving.  Look for signs of stress in yourself and in the person you are caring for. If you notice signs of stress, take steps to manage it.  Consider any safety risks and take steps to avoid them.  Organize medications in a pill box for each day of the week.  Create a plan to handle any legal or financial matters. Get legal or financial advice if needed.  Keep a calendar in a central location to remind the person of appointments or other activities. Tips for reducing the risk of injury  Keep floors clear of clutter. Remove rugs, magazine racks, and floor lamps.  Keep hallways well lit, especially at night.  Put a handrail and nonslip mat in the bathtub or   shower.  Put childproof locks on cabinets that contain dangerous items, such as medicines, alcohol, guns, toxic cleaning items, sharp tools or utensils, matches, and lighters.  Put the locks in places where the person cannot see or reach them easily. This will help ensure that the person does not wander out of the house and get lost.  Be prepared for emergencies. Keep a list of emergency phone  numbers and addresses in a convenient area.  Remove car keys and lock garage doors so that the person does not try to get in the car and drive.  Have the person wear a bracelet that tracks locations and identifies the person as having memory problems. This should be worn at all times for safety. Where to find support: Many individuals and organizations offer support. These include:  Support groups for people with dementia and for caregivers.  Counselors or therapists.  Home health care services.  Adult day care centers. Where to find more information Alzheimer's Association: www.alz.org Contact a health care provider if:  The person's health is rapidly getting worse.  You are no longer able to care for the person.  Caring for the person is affecting your physical and emotional health.  The person threatens himself or herself, you, or anyone else. Summary  Dementia is a term used to describe a number of symptoms that affect memory and thinking.  Dementia usually gets worse slowly over time.  Take steps to reduce the person's risk of injury, and to plan for future care.  Caregivers need support, relief from caregiving, and time for their own lives. This information is not intended to replace advice given to you by your health care provider. Make sure you discuss any questions you have with your health care provider. Document Released: 05/10/2016 Document Revised: 05/10/2016 Document Reviewed: 05/10/2016 Elsevier Interactive Patient Education  2019 Elsevier Inc.  

## 2018-09-17 NOTE — Assessment & Plan Note (Signed)
Very uncontrolled, but asymptomatic He is not having any red flag symptoms that warrant more emergent care He should resume his Lasix, amlodipine, labetalol Would not want him taking a beta-blocker if he is using cocaine, but his daughter states he has no access to this currently We will follow-up in 1 month when he is taking his medications more regularly (see plan below for this) to reassess need Reviewed recent labs

## 2018-09-17 NOTE — Assessment & Plan Note (Signed)
History of, but does still wander to neighbors houses and drink alcohol with them per daughter Stressed importance of avoidance especially given his advanced dementia

## 2018-09-17 NOTE — Assessment & Plan Note (Signed)
Related to his advanced dementia After home health nursing determines need for equipment, will be able to write prescription through his Medicare pharmacy for the supplies

## 2018-09-17 NOTE — Assessment & Plan Note (Signed)
No recent drug use Daughter reports he has no access to this He does continue to use marijuana, however This contributes to his dementia

## 2018-09-17 NOTE — Assessment & Plan Note (Signed)
Stable Followed by ID Last viral load undetectable and CD4 count 310 There is a lot of concern about him not taking his medications at home, however He should continue his current dose of [employer]

## 2018-09-17 NOTE — Assessment & Plan Note (Signed)
Continue statin as above Stressed importance of good blood pressure control See plan above for working on medication adherence and getting home health services

## 2018-09-18 ENCOUNTER — Ambulatory Visit: Payer: Self-pay | Admitting: Family Medicine

## 2018-09-19 ENCOUNTER — Telehealth: Payer: Self-pay

## 2018-09-19 NOTE — Telephone Encounter (Signed)
LMTCB, Have not received a fax for home care services.

## 2018-09-19 NOTE — Telephone Encounter (Signed)
Peter Cox calling that she fax a paper for liberty assessment for home care service this morning and was asking if we have receive it. Told patient it usually takes time.  Peter Cox also requested for Dr. B nurse to call her back.

## 2018-09-21 ENCOUNTER — Ambulatory Visit: Payer: Self-pay | Admitting: *Deleted

## 2018-09-21 NOTE — Chronic Care Management (AMB) (Signed)
  Chronic Care Management   Outreach Note  09/21/2018 Name: MCKENZIE TORUNO MRN: [id number] DOB: Oct 21, 1960  Referred by: [name], MD Reason for referral : Chronic Care Management (Initial Outreach)   An unsuccessful telephone outreach was attempted today to patient's dtr/caregiver Rolly Salter @ [phone] . The patient was referred to the case management team by for assistance with in home care needs related to dementia.   Follow Up Plan: The CM team will reach out to the patient again over the next 7 days.    [facility] Care Management [phone]

## 2018-09-21 NOTE — Telephone Encounter (Signed)
Pt's daughter Frazier Butt dropped off forms and request call back when completed. Forms placed in provider's box. Please advise. Thanks TNP

## 2018-09-24 NOTE — Telephone Encounter (Signed)
Called to advised forms were ready for pick up. No answer & no voicemail. Thanks TNP

## 2018-09-25 ENCOUNTER — Telehealth: Payer: Self-pay

## 2018-09-25 NOTE — Telephone Encounter (Signed)
Leafy Ro from home health is reporting pt's blood pressure at 220/104.  She states he is asymptomatic and has not taking his medication yet this morning.     Contact [phone]   Thanks,   -Mickel Baas

## 2018-09-26 NOTE — Telephone Encounter (Signed)
This seems to be his baseline without medications.  He needs to get on regular schedule with his medications as prescribed. As long as asymptomatic, no need for intervention/ED visit for that BP. Please let Gardner RN know

## 2018-09-26 NOTE — Telephone Encounter (Signed)
Tina advised. ?

## 2018-10-03 ENCOUNTER — Telehealth: Payer: Self-pay | Admitting: Family Medicine

## 2018-10-03 NOTE — Telephone Encounter (Signed)
error 

## 2018-10-08 ENCOUNTER — Ambulatory Visit: Payer: Self-pay | Admitting: Pharmacist

## 2018-10-08 DIAGNOSIS — I10 Essential (primary) hypertension: Secondary | ICD-10-CM

## 2018-10-08 DIAGNOSIS — N182 Chronic kidney disease, stage 2 (mild): Secondary | ICD-10-CM

## 2018-10-08 DIAGNOSIS — E782 Mixed hyperlipidemia: Secondary | ICD-10-CM

## 2018-10-08 DIAGNOSIS — B20 Human immunodeficiency virus [HIV] disease: Secondary | ICD-10-CM

## 2018-10-08 NOTE — Chronic Care Management (AMB) (Signed)
  Chronic Care Management   Note  10/08/2018 Name: Peter Cox MRN: [id number] DOB: 12/09/1960  Peter Cox is a 58 y.o. year old male who sees Bacigalupo, Dionne Bucy, MD for primary care. Dr. Brita Romp asked the CCM team to consult the patient for assistance with chronic disease management related to dementia, HIV, adherence, HTN. Referral was placed 09/17/18   Telephone outreach to patient today to introduce CCM services. Peter Cox #2). Spoke with daughter Peter [facility]).   Assessment: Peter Cox had several questions but did NOT consent to CCM services. I believe that Peter Cox would greatly benefit from CCM and perhaps further discussion with Dr. B is warranted.  #Pullups and wipes: Peter Cox states that she needs wipes and pull ups. These were provided by home health through [facility], however home health is ending today. Peter Cox will have to pay for these supplies when home health ends.   #Mail order pharmacy: All prescriptions at [facility]. Technician states that patient has not had any medications filled since October. Peter Cox needs to call pharmacy to set up shipping information.  Plan: Peter Cox agreed to services and verbal consent obtained. I have scheduled an appointment for Peter Cox next week.    Peter Cox was given information about Chronic Care Management services today including:  1. CCM service includes personalized support from designated clinical staff supervised by her physician, including individualized plan of care and coordination with other care providers 2. 24/7 contact phone numbers for assistance for urgent and routine care needs. 3. Service will only be billed when office clinical staff spend 20 minutes or more in a month to coordinate care. 4. Only one practitioner may furnish and bill the service in a calendar month. 5. The patient may stop CCM services at any time (effective at the end of the month) by phone call to the office staff. 6.  The patient will be responsible for cost sharing (co-pay) of up to 20% of the service fee (after annual deductible is met).  Patient did not agree to services and wishes to consider information provided before deciding about enrollment in CCM services.     Ruben Reason, PharmD Clinical Pharmacist [place] [phone]

## 2018-10-11 ENCOUNTER — Telehealth: Payer: Self-pay | Admitting: *Deleted

## 2018-10-11 NOTE — Telephone Encounter (Signed)
Noted. Thank you for all you do!

## 2018-10-11 NOTE — Telephone Encounter (Signed)
The intent of this communication is to inform the Health Care Team that this patient will be discharged from [facility]). GOALS MET at this time. He has maintained his viremia for greater than 6 months and is keeping his appt with PCP and [facility].    Effective 10/02/18 patient will be discharged and removed from [facility] active patient listing.

## 2018-10-15 ENCOUNTER — Encounter: Payer: Medicare HMO | Admitting: Family Medicine

## 2018-10-15 ENCOUNTER — Ambulatory Visit: Payer: Self-pay

## 2018-10-15 ENCOUNTER — Encounter: Payer: Self-pay | Admitting: Family Medicine

## 2018-10-15 NOTE — Chronic Care Management (AMB) (Signed)
  Chronic Care Management   Note  10/15/2018 Name: LORAINE FREID MRN: [id number] DOB: [birth year]  VIYAN ROSAMOND is a 58 y.o. year old male who sees Bacigalupo, Dionne Bucy, MD for primary care. Dr. Brita Romp asked the CCM team to consult the patient for assistance with chronic disease management related to dementia, HIV, adherence, HTN. Referral was placed 09/17/18 during last office visit. Second telephone outreach to patient's daughter Maryelizabeth Kaufmann today to introduce CCM services.  SDOH (Social Determinants of Health) screening performed today. See Care Plan Entry related to challenges with: Financial Strain   Mr. Skoda was given information about Chronic Care Management services today including:  1. CCM service includes personalized support from designated clinical staff supervised by his physician, including individualized plan of care and coordination with other care providers 2. 24/7 contact phone numbers for assistance for urgent and routine care needs. 3. Service will only be billed when office clinical staff spend 20 minutes or more in a month to coordinate care. 4. Only one practitioner may furnish and bill the service in a calendar month. 5. The patient may stop CCM services at any time (effective at the end of the month) by phone call to the office staff. 6. The patient will be responsible for cost sharing (co-pay) of up to 20% of the service fee (after annual deductible is met).  Patient's daughter agreed to services and verbal consent obtained.      Plan: Referral to [facility] RN CM scheduled initial intake assessment for 10/22/2018 at 9:30 with daughter Nelda Marseille. Rollene Rotunda, RN, BSN Nurse Care Coordinator [facility] Care Management [phone]

## 2018-10-15 NOTE — Patient Instructions (Signed)
1. Thank You for allowing the CCM (Chronic Care Management) Team to assist you with your healthcare goals!! I look forward to speaking with you on 10/22/2018 at 9:00 2. Please have ALL medications available during your appointment appointment! If you have a blood sugar meter or a blood pressure monitor at home, have those as well.  3.  Contact the CCM Team if you have any question or need to reschedule your initial visit.  CCM (Chronic Care Management) Team   Trish Fountain RN, BSN Nurse Care Coordinator  [phone]  Ruben Reason PharmD  Clinical Pharmacist  [phone]   Elliot Gurney, LCSW Clinical Social Worker [phone]  Peter Cox/daughter Peter Cox was given information about Chronic Care Management services today including:  1. CCM service includes personalized support from designated clinical staff supervised by his physician, including individualized plan of care and coordination with other care providers 2. 24/7 contact phone numbers for assistance for urgent and routine care needs. 3. Service will only be billed when office clinical staff spend 20 minutes or more in a month to coordinate care. 4. Only one practitioner may furnish and bill the service in a calendar month. 5. The patient may stop CCM services at any time (effective at the end of the month) by phone call to the office staff. 6. The patient will be responsible for cost sharing (co-pay) of up to 20% of the service fee (after annual deductible is met).  Patient's daughter agreed to services and verbal consent obtained on patients behalf secondary to dementia.

## 2018-10-17 ENCOUNTER — Ambulatory Visit (INDEPENDENT_AMBULATORY_CARE_PROVIDER_SITE_OTHER): Payer: Medicare HMO | Admitting: *Deleted

## 2018-10-17 DIAGNOSIS — F0280 Dementia in other diseases classified elsewhere without behavioral disturbance: Secondary | ICD-10-CM

## 2018-10-17 DIAGNOSIS — R590 Localized enlarged lymph nodes: Secondary | ICD-10-CM

## 2018-10-17 DIAGNOSIS — J439 Emphysema, unspecified: Secondary | ICD-10-CM | POA: Diagnosis not present

## 2018-10-17 DIAGNOSIS — B20 Human immunodeficiency virus [HIV] disease: Secondary | ICD-10-CM

## 2018-10-17 NOTE — Patient Instructions (Signed)
Thank you allowing the Chronic Care Management Team to be a part of your care! It was a pleasure speaking with you today!  1. Please expect a phone call from [facility] [phone] regarding patient's Medicaid authorization for the incontinent supplies. 2. Please cal this Education officer, museum with any questions or concerns regarding supply needs.       CCM (Chronic Care Management) Team   Trish Fountain RN, BSN Nurse Care Coordinator  [phone]  Ruben Reason PharmD  Clinical Pharmacist  [phone]   [place], LCSW Clinical Social Worker [phone]  Goals Addressed            This Visit's Progress   . "it is a financial strain to pay for his incontinent supplies" (pt-stated)       Current Barriers:  . Financial constraints  Clinical Social Work Clinical Goal(s):  Marland Kitchen Over the next 30 days, client will work with SW to address concerns related to incontinent supplies  Interventions: . Discussed patient's need for incontinent supplies with patient's daughter and steps that have been taken to obtain them . This social worker made a conference call to [place] [phone] with patient's daughter to complete request for the supplies needed. The order was placed, however patient's medicaid will have to be verified and a order will have to be signed by patient's doctor verifying medical necessity. Home Care Delivery will contact patient's daughter once the Medicaid is verified. .   Patient Self Care Activities:  . Currently UNABLE TO independently complete ADL's and IADL's  Initial goal documentation         The patient's daughter verbalized understanding of instructions provided today and declined a print copy of patient instruction materials.   The CM team will reach out to the patient again over the next 14 days.

## 2018-10-17 NOTE — Chronic Care Management (AMB) (Signed)
.  ccms  Chronic Care Management    Clinical Social Work General Follow Up Note  10/17/2018 Name: Peter Cox MRN: [id number] DOB: February 18, 1961  Peter Cox is a 58 y.o. year old male who is a primary care patient of Bacigalupo, Dionne Bucy, MD. The CCM team was consulted for assistance with [employer] for incontinent supplies.   Review of patient status, including review of consultants reports, relevant laboratory and other test results, and collaboration with appropriate care team members and the patient's provider was performed as part of comprehensive patient evaluation and provision of chronic care management services.    Goals Addressed            This Visit's Progress   . "it is a financial strain to pay for his incontinent supplies" (pt-stated)       Phone call to patient's daughter to discuss need for incontinent supplies. Per patient's daughter she was told that patient's doctor's office was to assist with this. Patient does not receive HH at this time, however does receive 80 hours a month in personal care services.  This social worker made a conference call to [place] [facility] [phone] to complete request for the supplies needed. The order was placed, however patient's medicaid will have to be verified and a order will have to be signed by patient's doctor verifying medical necessity. Home Care Delivery will contact patient's daughter once the Medicaid is verified.    Current Barriers:  . Financial constraints  Clinical Social Work Clinical Goal(s):   Interventions: . Discussed patient's need for incontinent supplies with patient's daughter and steps that have been taken to obtain them        Conference call to [facility] [phone] with patient's daughter to complete request          for the supplies needed. Order was placed, however patient's Medicaid will have to be verified               and the order will have to be signed by patient's doctor  verifying medical necessity. Home Care             Delivery will contact patient's daughter once the Medicaid is verified.  Patient Self Care Activities:  . Currently UNABLE TO independently complete ADL's and IADL's  Initial goal documentation          Follow Up Plan: SW will follow up with patient's daughter by phone over the next week regarding authorization for supply order    Elliot Gurney, [place] Worker  [facility] Management [phone]

## 2018-10-18 ENCOUNTER — Telehealth: Payer: Self-pay | Admitting: *Deleted

## 2018-10-18 NOTE — Telephone Encounter (Signed)
Destiny from [facility] called for a verbal order to send pt's 1st months supply to his home. Destiny stated she had faxed a form with orders for Dr. B to sign and fax back but they need a verbal asap for the 1st month shipment. Please advise? [phone]

## 2018-10-18 NOTE — Telephone Encounter (Signed)
Jacqulynn Cadet called again about orders below. Telephone number if any questions is [phone]. Thanks!

## 2018-10-19 NOTE — Progress Notes (Signed)
This encounter was created in error - please disregard.

## 2018-10-19 NOTE — Telephone Encounter (Signed)
OK I called and gave verbal orders.  They will ship 1st month today. Will complete form next week

## 2018-10-19 NOTE — Telephone Encounter (Addendum)
Attempted to give verbal orders to Peter Cox but they will not accept a verbal except from a MD. He will fax over a order to be signed and faxed back.

## 2018-10-19 NOTE — Telephone Encounter (Signed)
OK for verbals 

## 2018-10-22 ENCOUNTER — Encounter: Payer: Self-pay | Admitting: Family Medicine

## 2018-10-22 ENCOUNTER — Ambulatory Visit: Payer: Self-pay

## 2018-10-22 ENCOUNTER — Encounter: Payer: Medicare HMO | Admitting: Family Medicine

## 2018-10-22 ENCOUNTER — Ambulatory Visit: Payer: Medicare HMO

## 2018-10-22 DIAGNOSIS — N182 Chronic kidney disease, stage 2 (mild): Secondary | ICD-10-CM

## 2018-10-22 DIAGNOSIS — I10 Essential (primary) hypertension: Secondary | ICD-10-CM

## 2018-10-22 DIAGNOSIS — B20 Human immunodeficiency virus [HIV] disease: Secondary | ICD-10-CM

## 2018-10-22 DIAGNOSIS — F0280 Dementia in other diseases classified elsewhere without behavioral disturbance: Secondary | ICD-10-CM

## 2018-10-22 NOTE — Progress Notes (Signed)
This encounter was created in error - please disregard.

## 2018-10-22 NOTE — Chronic Care Management (AMB) (Signed)
  Chronic Care Management   Unsuccessful Call Note 10/22/2018 Name: JAYDEN KRATOCHVIL MRN: [id number] DOB: [birth year]  Care Coordination: Unsuccessful telephone call to [name], daughter of Mr. Skippy Marhefka, for completion of initial assessment and to establish health goals, as previously scheduled. Unfortunately Ms. Green did not answer VM has not been set up.  CCM RN CM was able to contact patient other daughter [name]. States patient and sister should be available later this afternoon and to please call back. It is noted that patient had a remote telehealth visit with PCP this am.   Plan: Appointment with CCM RN CM rescheduled for 1pm today.   Donyea Gafford E. Rollene Rotunda, RN, BSN Nurse Care Coordinator [facility] Care Management [phone]

## 2018-10-22 NOTE — Progress Notes (Signed)
    Patient unavailable at time of evisit and unable to be reached by phone   This encounter was created in error - please disregard.

## 2018-10-22 NOTE — Chronic Care Management (AMB) (Signed)
  Chronic Care Management   Unsuccessful Call Note 10/22/2018 Name: Peter Cox MRN: [id number]       DOB: Apr 05, 1961  Care Coordination: Unsuccessful telephone call to Peter Cox, daughter of [name], for completion of initial assessment and to establish health goals, as previously scheduled. Unfortunately Ms. Green did not answer VM has not been set up.  CCM RN CM was able to contact patient other daughter Peter Cox. States patient and sister should be available later this afternoon and to please call back. It is noted that patient had a remote telehealth visit with PCP this am.  Upon call back to Peter Cox as previously discussed with Peter Finders, Ms. Green did not answer and CCM RN CM was unable to leave a VM message as VM has not been set up.   Plan: Will attempt to follow up again within 7 days  Peter Fillingim E. Rollene Rotunda, RN, BSN Nurse Care Coordinator [facility] Care Management [phone]

## 2018-10-24 ENCOUNTER — Encounter: Payer: Self-pay | Admitting: *Deleted

## 2018-10-24 ENCOUNTER — Ambulatory Visit (INDEPENDENT_AMBULATORY_CARE_PROVIDER_SITE_OTHER): Payer: Medicare HMO | Admitting: *Deleted

## 2018-10-24 DIAGNOSIS — F0280 Dementia in other diseases classified elsewhere without behavioral disturbance: Secondary | ICD-10-CM | POA: Diagnosis not present

## 2018-10-24 DIAGNOSIS — R32 Unspecified urinary incontinence: Secondary | ICD-10-CM

## 2018-10-24 DIAGNOSIS — B20 Human immunodeficiency virus [HIV] disease: Secondary | ICD-10-CM

## 2018-10-24 NOTE — Patient Instructions (Signed)
Thank you allowing the Chronic Care Management Team to be a part of your care! It was a pleasure speaking with you today!  1. Please contact [facility] to provide them with the needed insurance information. 2. Please contact this Education officer, museum with any questions or concerns regarding the order for incontinent supplies.       CCM (Chronic Care Management) Team   Trish Fountain RN, BSN Nurse Care Coordinator  [phone]  Ruben Reason PharmD  Clinical Pharmacist  [phone]   [place], LCSW Clinical Social Worker [phone]  Goals Addressed            This Visit's Progress   . "it is a financial strain to pay for his incontinent supplies" (pt-stated)       Current Barriers:  . Financial constraints  Clinical Social Work Clinical Goal(s):  Marland Kitchen Over the next 30 days, client will work with SW to address concerns related to incontinent supplies  Interventions: .  Patient's need for incontinent supplies with patient's daughter and steps that have been taken to obtain them revisited . Confirmed with patient's daughter that patient's medicaid has been verified through [place] [phone] and will cover the incontinent supplies at covering 100%, however they would need to verify patient's [facility] and will need the number to verify coverage. . Discussed with patient's daughter plan to contact  [facility] to provide them with the needed insurance information .   Patient Self Care Activities:  . Currently UNABLE TO independently complete ADL's and IADL's  Please see past updates related to this goal by clicking on the "Past Updates" button in the selected goal          The patient verbalized understanding of instructions provided today and declined a print copy of patient instruction materials.   The CM team will reach out to the patient again over the next 7 days.

## 2018-10-24 NOTE — Progress Notes (Signed)
This encounter was created in error - please disregard.

## 2018-10-24 NOTE — Chronic Care Management (AMB) (Signed)
  Chronic Care Management    Clinical Social Work Follow Up Note  10/24/2018 Name: Peter Cox MRN: [id number] DOB: 1961/02/03  Peter Cox is a 58 y.o. year old male who is a primary care patient of Bacigalupo, Dionne Bucy, MD. The CCM team was consulted for assistance with incontinent supplies.   Review of patient status, including review of consultants reports, other relevant assessments, and collaboration with appropriate care team members and the patient's provider was performed as part of comprehensive patient evaluation and provision of chronic care management services.     Goals Addressed            This Visit's Progress   . "it is a financial strain to pay for his incontinent supplies" (pt-stated)       Current Barriers:  . Financial constraints  Clinical Social Work Clinical Goal(s):  Marland Kitchen Over the next 30 days, client will work with SW to address concerns related to incontinent supplies  Interventions: .  Patient's need for incontinent supplies with patient's daughter and steps that have been taken to obtain them revisited . Confirmed with patient's daughter that patient's medicaid has been verified through [place] [phone] and will cover the incontinent supplies at covering 100%, however they would need to verify patient's [facility] and will need the number to verify coverage. . Discussed with patient's daughter plan to contact  [facility] to provide them with the needed insurance information .   Patient Self Care Activities:  . Currently UNABLE TO independently complete ADL's and IADL's  Please see past updates related to this goal by clicking on the "Past Updates" button in the selected goal          Follow Up Plan: SW will follow up with patient by phone over the next 7 days    [place], [place] Worker  [place] [facility] [phone]

## 2018-10-25 ENCOUNTER — Other Ambulatory Visit: Payer: Self-pay

## 2018-10-25 ENCOUNTER — Ambulatory Visit: Payer: Self-pay

## 2018-10-25 DIAGNOSIS — I10 Essential (primary) hypertension: Secondary | ICD-10-CM

## 2018-10-25 DIAGNOSIS — B20 Human immunodeficiency virus [HIV] disease: Secondary | ICD-10-CM

## 2018-10-25 DIAGNOSIS — F0280 Dementia in other diseases classified elsewhere without behavioral disturbance: Secondary | ICD-10-CM

## 2018-10-25 NOTE — Chronic Care Management (AMB) (Signed)
  Chronic Care Management   Note  10/25/2018 Name: Peter Cox MRN: [id number] DOB: 02-23-1961  Care Coordination: Successful telephone encounter to patients daughter Peter Cox after multiple unsuccessful attempts to contact Peter Cox for CCM RN CM initial assessment and goal setting appointment (not available for scheduled initial assessment appointment x2).  CCM RN CM discussed unsuccessful contacts with Peter Cox who has agreed to be available for telephone appointment 10/29/2018 at 10:00  Plan: Appointment as scheduled above  Peter Rowen E. Rollene Rotunda, RN, BSN Nurse Care Coordinator [facility] Care Management [phone]

## 2018-10-26 ENCOUNTER — Ambulatory Visit: Payer: Self-pay | Admitting: *Deleted

## 2018-10-26 DIAGNOSIS — B20 Human immunodeficiency virus [HIV] disease: Secondary | ICD-10-CM

## 2018-10-26 DIAGNOSIS — F0280 Dementia in other diseases classified elsewhere without behavioral disturbance: Secondary | ICD-10-CM

## 2018-10-26 NOTE — Chronic Care Management (AMB) (Signed)
  Chronic Care Management   Social Work Note  10/26/2018 Name: Peter Cox MRN: [id number] DOB: 12-17-1960  Peter Cox is a 58 y.o. year old male who sees Bacigalupo, Dionne Bucy, MD for primary care. The CCM team was consulted for assistance with incontinent supplies.  Phone call from [facility] requesting coverage information to be verified so that patient's incontinent supplies can be ordered. Coverage information provided. Once insurance coverage is verified, patient's supplies will be ordered for delivery.   Goals Addressed   None     Follow Up Plan: SW will follow up with patient by phone over the next 2 weeks   Arian Mcquitty, [place] Worker  [facility] Management [phone]

## 2018-10-29 ENCOUNTER — Ambulatory Visit: Payer: Self-pay

## 2018-10-29 DIAGNOSIS — N182 Chronic kidney disease, stage 2 (mild): Secondary | ICD-10-CM

## 2018-10-29 DIAGNOSIS — I10 Essential (primary) hypertension: Secondary | ICD-10-CM

## 2018-10-29 DIAGNOSIS — F0280 Dementia in other diseases classified elsewhere without behavioral disturbance: Secondary | ICD-10-CM

## 2018-10-29 NOTE — Chronic Care Management (AMB) (Signed)
   Chronic Care Management   Unsuccessful Call Note 10/29/2018 Name: Peter Cox MRN: [id number] DOB: 10/06/1960  Unsuccessful telephone encounter to Peter Cox for rescheduled initial assessment and goal setting appointment after multiple unsuccessful attempts to contact Peter Cox for CCM RN CM initial assessment. Peter Cox (daughers) have been scheduled for 3 appointments and unavailable during appointment times.   Today, CCM RN CM left [employer] compliant VM message requesting call back. Will cease rescheduling initial assessment and outreach attempts secondary to 3 unsuccessful appointments.   Peter Cox E. Rollene Rotunda, RN, BSN Nurse Care Coordinator [facility] Care Management [phone]

## 2018-10-29 NOTE — Progress Notes (Signed)
This encounter was created in error - please disregard.

## 2018-10-31 ENCOUNTER — Ambulatory Visit: Payer: Self-pay | Admitting: *Deleted

## 2018-10-31 DIAGNOSIS — B20 Human immunodeficiency virus [HIV] disease: Secondary | ICD-10-CM

## 2018-10-31 DIAGNOSIS — F0280 Dementia in other diseases classified elsewhere without behavioral disturbance: Secondary | ICD-10-CM

## 2018-10-31 NOTE — Chronic Care Management (AMB) (Signed)
  Chronic Care Management    Clinical Social Work Follow Up Note  10/31/2018 Name: Peter Cox MRN: [id number] DOB: [birth year]  Peter Cox is a 58 y.o. year old male who is a primary care patient of Bacigalupo, Dionne Bucy, MD. The CCM team was consulted for assistance with incontinent supplies.  Review of patient status, including review of consultants reports, other relevant assessments, and collaboration with appropriate care team members and the patient's provider was performed as part of comprehensive patient evaluation and provision of chronic care management services.     Goals Addressed            This Visit's Progress   . "it is a financial strain to pay for his incontinent supplies" (pt-stated)       Phone call to patient's daughter to confirm that she had received the order of incontinent supplies for patient. Per patient's daughter the supplies were received. She verbalized having no additional community resource needs at this time. Current Barriers:  . Financial constraints  Clinical Social Work Clinical Goal(s):  Marland Kitchen Over the next 30 days, client will work with SW to address concerns related to incontinent supplies  Interventions: .  CCM social worker confirmed that patient has received his first shipment of  incontinent supplies(adult pull-ups, wipes and bed pads) through [place] . Medicaid will cover the incontinent supplies at 100%. . Needs assessment of additional community resource needs completed  Patient Self Care Activities:  . Currently UNABLE TO independently complete ADL's and IADL's  Please see past updates related to this goal by clicking on the "Past Updates" button in the selected goal          Follow Up Plan: Client's daughter will contact this CCM social worker with any additional community resource needs    [place], [place] Worker  [place] [facility] [phone]

## 2018-10-31 NOTE — Patient Instructions (Signed)
Thank you allowing the Chronic Care Management Team to be a part of your care! It was a pleasure speaking with you today!  1. Please contact this CCM social worker with any questions or concerns regarding any additional community resource needs.       CCM (Chronic Care Management) Team   Trish Fountain RN, BSN Nurse Care Coordinator  [phone]  Ruben Reason PharmD  Clinical Pharmacist  [phone]   [place], LCSW Clinical Social Worker [phone]  Goals Addressed            This Visit's Progress   . "it is a financial strain to pay for his incontinent supplies" (pt-stated)       Current Barriers:  . Financial constraints  Clinical Social Work Clinical Goal(s):  Marland Kitchen Over the next 30 days, client will work with SW to address concerns related to incontinent supplies  Interventions: .  CCM social worker confirmed that patient has received his first shipment of  incontinent supplies(adult pull-ups, wipes and bed pads) through [place] . Medicaid will cover the incontinent supplies at 100%. . Needs assessment of additional community resource needs completed  Patient Self Care Activities:  . Currently UNABLE TO independently complete ADL's and IADL's  Please see past updates related to this goal by clicking on the "Past Updates" button in the selected goal          The patient verbalized understanding of instructions provided today and declined a print copy of patient instruction materials.   No further follow up required: Patient's daughter has agreed to call this Education officer, museum with any additonal social work needs

## 2018-11-16 ENCOUNTER — Telehealth (INDEPENDENT_AMBULATORY_CARE_PROVIDER_SITE_OTHER): Payer: Medicare HMO | Admitting: Neurology

## 2018-11-16 ENCOUNTER — Other Ambulatory Visit: Payer: Self-pay

## 2018-11-16 VITALS — Ht 65.0 in | Wt 137.0 lb

## 2018-11-16 DIAGNOSIS — B20 Human immunodeficiency virus [HIV] disease: Secondary | ICD-10-CM

## 2018-11-16 DIAGNOSIS — F03A0 Unspecified dementia, mild, without behavioral disturbance, psychotic disturbance, mood disturbance, and anxiety: Secondary | ICD-10-CM

## 2018-11-16 DIAGNOSIS — F0390 Unspecified dementia without behavioral disturbance: Secondary | ICD-10-CM | POA: Diagnosis not present

## 2018-11-16 MED FILL — BIKTARVY 50-200-25 MG TABS: 50-200-25 | 30 days supply | Qty: 30 | Fill #0
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
## 2018-11-26 MED ORDER — DONEPEZIL HCL 10 MG PO TABS: ORAL_TABLET | ORAL | 11 refills | Status: DC

## 2018-12-11 MED FILL — BIKTARVY 50-200-25 MG TABS: 50-200-25 | 30 days supply | Qty: 30 | Fill #1

## 2018-12-14 ENCOUNTER — Other Ambulatory Visit: Payer: Medicare HMO

## 2018-12-14 ENCOUNTER — Other Ambulatory Visit: Payer: Self-pay | Admitting: *Deleted

## 2018-12-14 DIAGNOSIS — B20 Human immunodeficiency virus [HIV] disease: Secondary | ICD-10-CM

## 2018-12-18 ENCOUNTER — Other Ambulatory Visit: Payer: Medicare HMO

## 2018-12-28 ENCOUNTER — Encounter: Payer: Medicare HMO | Admitting: Family

## 2019-01-01 ENCOUNTER — Encounter: Payer: Medicare HMO | Admitting: Family

## 2019-01-07 ENCOUNTER — Telehealth: Payer: Self-pay | Admitting: Pharmacy Technician

## 2019-01-07 NOTE — Telephone Encounter (Signed)
[facility] Patient Advocate Encounter   I was successful in securing patient a $7500 grant from [employer] [place] (PAF) to provide copayment coverage for Biktarvy.  This will make the out of pocket cost $0.     I have spoken with the pharmacy.    The billing information is as follows and has been shared with [place].    RxBin: [id number] PCN:   [id number] Member ID: [id number] Group ID: [id number] Dates of Eligibility: 01/07/2019 through 01/07/2020  Patient knows to call the office with questions or concerns.  Venida Jarvis. Nadara Mustard [place] Patient [facility] Phone: [phone] Fax:  [phone]

## 2019-01-09 MED FILL — BIKTARVY 50-200-25 MG TABS: 50-200-25 | 30 days supply | Qty: 30 | Fill #2

## 2019-02-05 ENCOUNTER — Other Ambulatory Visit: Payer: Self-pay

## 2019-02-05 ENCOUNTER — Emergency Department
Admission: EM | Admit: 2019-02-05 | Discharge: 2019-02-06 | Disposition: A | Discharge status: Home / Self Care | Payer: Medicare HMO | Attending: Emergency Medicine | Admitting: Emergency Medicine

## 2019-02-05 ENCOUNTER — Encounter: Payer: Self-pay | Admitting: Emergency Medicine

## 2019-02-05 DIAGNOSIS — R4182 Altered mental status, unspecified: Secondary | ICD-10-CM | POA: Diagnosis not present

## 2019-02-05 DIAGNOSIS — Z5321 Procedure and treatment not carried out due to patient leaving prior to being seen by health care provider: Secondary | ICD-10-CM | POA: Diagnosis not present

## 2019-02-05 LAB — URINALYSIS, COMPLETE (UACMP) WITH MICROSCOPIC
Bacteria, UA: NONE SEEN
Bilirubin Urine: NEGATIVE
Glucose, UA: NEGATIVE mg/dL
Hgb urine dipstick: NEGATIVE
Ketones, ur: NEGATIVE mg/dL
Leukocytes,Ua: NEGATIVE
Nitrite: NEGATIVE
Protein, ur: 100 mg/dL — AB
Specific Gravity, Urine: 1.024 (ref 1.005–1.030)
WBC, UA: NONE SEEN WBC/hpf (ref 0–5)
pH: 5 (ref 5.0–8.0)

## 2019-02-05 LAB — COMPREHENSIVE METABOLIC PANEL
ALT: 14 U/L (ref 0–44)
AST: 17 U/L (ref 15–41)
Albumin: 4.2 g/dL (ref 3.5–5.0)
Alkaline Phosphatase: 81 U/L (ref 38–126)
Anion gap: 8 (ref 5–15)
BUN: 36 mg/dL — ABNORMAL HIGH (ref 6–20)
CO2: 24 mmol/L (ref 22–32)
Calcium: 10 mg/dL (ref 8.9–10.3)
Chloride: 112 mmol/L — ABNORMAL HIGH (ref 98–111)
Creatinine, Ser: 2.92 mg/dL — ABNORMAL HIGH (ref 0.61–1.24)
GFR calc Af Amer: 26 mL/min — ABNORMAL LOW (ref >60)
GFR calc non Af Amer: 22 mL/min — ABNORMAL LOW (ref >60)
Glucose, Bld: 81 mg/dL (ref 70–99)
Potassium: 4 mmol/L (ref 3.5–5.1)
Sodium: 144 mmol/L (ref 135–145)
Total Bilirubin: 0.9 mg/dL (ref 0.3–1.2)
Total Protein: 7.6 g/dL (ref 6.5–8.1)

## 2019-02-05 LAB — CBC
HCT: 39.9 % (ref 39.0–52.0)
Hemoglobin: 13.3 g/dL (ref 13.0–17.0)
MCH: 30 pg (ref 26.0–34.0)
MCHC: 33.3 g/dL (ref 30.0–36.0)
MCV: 89.9 fL (ref 80.0–100.0)
Platelets: 159 10*3/uL (ref 150–400)
RBC: 4.44 MIL/uL (ref 4.22–5.81)
RDW: 13.6 % (ref 11.5–15.5)
WBC: 6.6 10*3/uL (ref 4.0–10.5)
nRBC: 0 % (ref 0.0–0.2)

## 2019-02-05 LAB — URINE DRUG SCREEN, QUALITATIVE (ARMC ONLY)
Amphetamines, Ur Screen: NOT DETECTED
Barbiturates, Ur Screen: NOT DETECTED
Benzodiazepine, Ur Scrn: NOT DETECTED
Cannabinoid 50 Ng, Ur ~~LOC~~: NOT DETECTED
Cocaine Metabolite,Ur ~~LOC~~: NOT DETECTED
MDMA (Ecstasy)Ur Screen: NOT DETECTED
Methadone Scn, Ur: NOT DETECTED
Opiate, Ur Screen: NOT DETECTED
Phencyclidine (PCP) Ur S: NOT DETECTED
Tricyclic, Ur Screen: NOT DETECTED

## 2019-02-05 LAB — ETHANOL: Alcohol, Ethyl (B): <10 mg/dL (ref <10)

## 2019-02-05 LAB — TROPONIN I (HIGH SENSITIVITY): Troponin I (High Sensitivity): 22 ng/L — ABNORMAL HIGH (ref <18)

## 2019-02-05 MED ORDER — SODIUM CHLORIDE 0.9% FLUSH: 3.0000 mL | Freq: Once | INTRAVENOUS | Status: DC

## 2019-02-05 NOTE — ED Notes (Signed)
No answer when called several times from lobby 

## 2019-02-05 NOTE — ED Triage Notes (Addendum)
Pt arrived via POV with daughter, states pt has hx of dementia, which daughter states his mental status has declined over the past month. DTR states pt is requiring more care than she can handle right now, states pt has been having loss of bowel and bladder function, has shown signs of aggressive behavior and has been wandering away.  Pt has hx of HIV also. Pt is compliant with medications per daughter.   DTR states pt has also been drinking alcohol.   Pt is not answering questions in triage, laughs when questions are asked.  Pt is able to state his name and birthday.

## 2019-02-05 NOTE — ED Notes (Signed)
No answer when called several times from lobby & outside 

## 2019-02-05 NOTE — ED Notes (Signed)
Per Daughter, would like patient placed somewhere in [place] where it is closer to family.

## 2019-02-05 NOTE — ED Notes (Signed)
Pt & daughter noted leaving ED lobby

## 2019-02-06 ENCOUNTER — Telehealth: Payer: Self-pay | Admitting: Emergency Medicine

## 2019-02-06 NOTE — ED Notes (Signed)
No answer when called several times from lobby 

## 2019-02-06 NOTE — Telephone Encounter (Signed)
Spoke to caregiver (daughter of patient) , LWBS 02/05/19 , called to follow up. Per caregiver, is needing him to be placed," he is not safe living here at home",

## 2019-02-07 MED FILL — BIKTARVY 50-200-25 MG TABS: 50-200-25 | 30 days supply | Qty: 30 | Fill #3

## 2019-02-18 ENCOUNTER — Emergency Department: Payer: Medicare HMO

## 2019-02-18 ENCOUNTER — Emergency Department
Admission: EM | Admit: 2019-02-18 | Discharge: 2019-02-19 | Disposition: A | Discharge status: Home / Self Care | Payer: Medicare HMO | Attending: Emergency Medicine | Admitting: Emergency Medicine

## 2019-02-18 ENCOUNTER — Other Ambulatory Visit: Payer: Self-pay

## 2019-02-18 DIAGNOSIS — F0390 Unspecified dementia without behavioral disturbance: Secondary | ICD-10-CM | POA: Diagnosis not present

## 2019-02-18 DIAGNOSIS — Z20828 Contact with and (suspected) exposure to other viral communicable diseases: Secondary | ICD-10-CM | POA: Diagnosis not present

## 2019-02-18 DIAGNOSIS — F17210 Nicotine dependence, cigarettes, uncomplicated: Secondary | ICD-10-CM | POA: Diagnosis not present

## 2019-02-18 DIAGNOSIS — Z79899 Other long term (current) drug therapy: Secondary | ICD-10-CM | POA: Insufficient documentation

## 2019-02-18 DIAGNOSIS — I129 Hypertensive chronic kidney disease with stage 1 through stage 4 chronic kidney disease, or unspecified chronic kidney disease: Secondary | ICD-10-CM | POA: Diagnosis not present

## 2019-02-18 DIAGNOSIS — N182 Chronic kidney disease, stage 2 (mild): Secondary | ICD-10-CM | POA: Diagnosis not present

## 2019-02-18 DIAGNOSIS — Z8673 Personal history of transient ischemic attack (TIA), and cerebral infarction without residual deficits: Secondary | ICD-10-CM | POA: Diagnosis not present

## 2019-02-18 DIAGNOSIS — R4182 Altered mental status, unspecified: Secondary | ICD-10-CM | POA: Diagnosis present

## 2019-02-18 DIAGNOSIS — I10 Essential (primary) hypertension: Secondary | ICD-10-CM

## 2019-02-18 DIAGNOSIS — Z21 Asymptomatic human immunodeficiency virus [HIV] infection status: Secondary | ICD-10-CM | POA: Diagnosis not present

## 2019-02-18 DIAGNOSIS — R5383 Other fatigue: Secondary | ICD-10-CM | POA: Insufficient documentation

## 2019-02-18 LAB — CBC WITH DIFFERENTIAL/PLATELET
Abs Immature Granulocytes: 0.03 10*3/uL (ref 0.00–0.07)
Basophils Absolute: 0 10*3/uL (ref 0.0–0.1)
Basophils Relative: 1 %
Eosinophils Absolute: 0.2 10*3/uL (ref 0.0–0.5)
Eosinophils Relative: 3 %
HCT: 37.7 % — ABNORMAL LOW (ref 39.0–52.0)
Hemoglobin: 12.9 g/dL — ABNORMAL LOW (ref 13.0–17.0)
Immature Granulocytes: 0 %
Lymphocytes Relative: 34 %
Lymphs Abs: 2.6 10*3/uL (ref 0.7–4.0)
MCH: 30 pg (ref 26.0–34.0)
MCHC: 34.2 g/dL (ref 30.0–36.0)
MCV: 87.7 fL (ref 80.0–100.0)
Monocytes Absolute: 0.5 10*3/uL (ref 0.1–1.0)
Monocytes Relative: 6 %
Neutro Abs: 4.3 10*3/uL (ref 1.7–7.7)
Neutrophils Relative %: 56 %
Platelets: 215 10*3/uL (ref 150–400)
RBC: 4.3 MIL/uL (ref 4.22–5.81)
RDW: 13.7 % (ref 11.5–15.5)
WBC: 7.6 10*3/uL (ref 4.0–10.5)
nRBC: 0 % (ref 0.0–0.2)

## 2019-02-18 LAB — COMPREHENSIVE METABOLIC PANEL
ALT: 16 U/L (ref 0–44)
AST: 15 U/L (ref 15–41)
Albumin: 4.4 g/dL (ref 3.5–5.0)
Alkaline Phosphatase: 90 U/L (ref 38–126)
Anion gap: 8 (ref 5–15)
BUN: 29 mg/dL — ABNORMAL HIGH (ref 6–20)
CO2: 29 mmol/L (ref 22–32)
Calcium: 9 mg/dL (ref 8.9–10.3)
Chloride: 105 mmol/L (ref 98–111)
Creatinine, Ser: 2.1 mg/dL — ABNORMAL HIGH (ref 0.61–1.24)
GFR calc Af Amer: 38 mL/min — ABNORMAL LOW (ref >60)
GFR calc non Af Amer: 33 mL/min — ABNORMAL LOW (ref >60)
Glucose, Bld: 112 mg/dL — ABNORMAL HIGH (ref 70–99)
Potassium: 3.8 mmol/L (ref 3.5–5.1)
Sodium: 142 mmol/L (ref 135–145)
Total Bilirubin: 0.3 mg/dL (ref 0.3–1.2)
Total Protein: 7.4 g/dL (ref 6.5–8.1)

## 2019-02-18 LAB — ETHANOL: Alcohol, Ethyl (B): <10 mg/dL (ref <10)

## 2019-02-18 NOTE — ED Notes (Signed)
Per daughter Darlyn Chamber, patient usually laughs at everything and somewhat can carry conversation, Patient left home and went across street and came back home with altered mental state. Per daughter history of drug use and thinks possible heroin use.

## 2019-02-18 NOTE — ED Provider Notes (Signed)
-----------------------------------------   11:21 PM on 02/18/2019 -----------------------------------------  Assuming care from Dr. Charna Archer.  In short, Peter Cox is a 58 y.o. male with a chief complaint of altered mental status.  Refer to the original H&P for additional details.  The current plan of care is to follow-up lab and imaging results.  Anticipate admission given the patient's altered mental status which is apparently a significant change from baseline.   ----------------------------------------- 2:46 AM on 02/19/2019 -----------------------------------------  Work-up was quite reassuring.  Chronic renal dysfunction at baseline but unchanged from priors.  No intoxication of alcohol or drugs, normal lab work otherwise, nonacute head CT, no evidence of acute infection on chest x-ray.  No evidence of acute infection; there are a few white blood cells in the urine but it is far from being a clear infection so I am sending a urine culture but we will not treat empirically.  I had the opportunity to speak with his daughter by phone and I gave her all the results.  The patient is able to get up and ambulate and seems to be more or less at his baseline.  She is comfortable taking him home and I gave my usual and customary return precautions.   Hinda Kehr, MD 02/19/19 [phone]

## 2019-02-18 NOTE — ED Triage Notes (Signed)
Patient arrived from home by [facility]. Per EMS patient's daughter called ems due to altered mental status. Per Daughter patient went next door and returned 20 mins later not the same. Per daughter she believes patient had possible drug use. Per daughter patient is usually giggles and talks about non sense but upon returning was not acting self. Per EMS family members in home tested COVID positive, patient has been tested two weeks ago and was negitive and was retested yesterday at health dept.

## 2019-02-18 NOTE — ED Provider Notes (Signed)
[facility] Emergency Department Provider Note   ____________________________________________   First MD Initiated Contact with Patient 02/18/19 2237     (approximate)  I have reviewed the triage vital signs and the nursing notes.   HISTORY  Chief Complaint Altered Mental Status    HPI Peter Cox is a 58 y.o. male with past medical history of substance abuse, HIV, dementia, hypertension who presents to the ED for altered mental status.  History is limited as patient provides only brief answers to questions.  Per EMS, he left home and went to a friend's house for a couple of hours.  When he returned home, family noted that he did not seem himself, was more lethargic than usual.  They were concerned that he might of use drugs.  Patient denies any drug use, admits only to drinking 1 beer.  He denies any complaints at this time, but is slow to respond to questions and provides only short answers.  He does not respond when asked if he has been taking his medications.        Past Medical History:  Diagnosis Date  . Alcohol abuse    heavy, clean since April  . Alcohol abuse 11/24/2014  . Cocaine abuse ([place])     clean since about 2000  . Drug abuse ([place]) 11/24/2014  . HIV infection ([place])    dx 08/08/2008 (unknown how he was exposed)  Initial VL 43,200 and CD4 410 on 08/28/08  . Hyperlipidemia   . Hypertension   . Marijuana abuse   . Mild cognitive impairment    likely vascular dementia/psa/?HIV componentCT Head 02/12/08 for htn crisis and concern for stroke: impression-1. Old right internal capsule and thalamic lacunar infarcts. 2. Mild to moderate chronic small vessell white matter ischemic changes in both cerebral hemisphere, greater on the right  . Neuromuscular disorder ([place])   . Seizures ([place])    "last year had seizure"  . Stroke ([place])   . Tobacco abuse     Patient Active Problem List   Diagnosis Date Noted  . History of substance abuse ([place])  09/17/2018  . Urinary incontinence 09/17/2018  . Bilateral primary osteoarthritis of knee 09/13/2018  . Decreased activities of daily living (ADL) 09/13/2018  . Alcohol abuse 11/24/2014  . Dementia without behavioral disturbance ([place]) 07/02/2014  . History of CVA (cerebrovascular accident) 07/02/2014  . CKD stage 2 11/20/2008  . Human immunodeficiency virus (HIV) disease ([place]) 08/17/2008  . HLD (hyperlipidemia) 06/26/2008  . Essential hypertension 06/24/2008    Past Surgical History:  Procedure Laterality Date  . CLOSED REDUCTION PATELLAR     right knee  . HEMORRHOID SURGERY    . SHOULDER ARTHROSCOPY  01/2008   w/extensive debridement (Dr Mardelle Matte)    Prior to Admission medications   Medication Sig Start Date End Date Taking? Authorizing Provider  amLODipine (NORVASC) 10 MG tablet Take 1 tablet (10 mg total) by mouth daily. 01/03/18   Truman Hayward, MD  atorvastatin (LIPITOR) 20 MG tablet Take 20 mg by mouth daily.    [provider]  bictegravir-emtricitabine-tenofovir AF (BIKTARVY) 50-200-25 MG TABS tablet Take 1 tablet by mouth daily.    [provider]  donepezil (ARICEPT) 10 MG tablet Take 1/2 tablet daily for 2 weeks, then increase to 1 tablet daily 11/26/18   Cameron Sprang, MD  furosemide (LASIX) 20 MG tablet Take 0.5 tablets (10 mg total) by mouth daily. Patient not taking: Reported on 11/16/2018 04/17/18  Golden Circle, FNP  labetalol (NORMODYNE) 100 MG tablet Take 1 tablet (100 mg total) by mouth 2 (two) times daily. 01/03/18   Truman Hayward, MD    Allergies Patient has no known allergies.  Family History  Problem Relation Age of Onset  . Diabetes Mother   . Hypertension Mother   . Stroke Mother   . Diabetes Father   . Hypertension Sister   . Hypertension Sister   . Colon cancer Neg Hx     Social History Social History   Tobacco Use  . Smoking status: Current Every Day Smoker    Packs/day: 0.50    Years: 40.00    Pack  years: 20.00    Types: Cigarettes  . Smokeless tobacco: Never Used  . Tobacco comment: Quit  x 2 week.  Substance Use Topics  . Alcohol use: Yes    Alcohol/week: 0.0 standard drinks    Comment: drink beer  . Drug use: Yes    Frequency: 7.0 times per week    Types: Marijuana    Comment: use Marijuana every day per pt.    Review of Systems  Constitutional: No fever/chills Eyes: No visual changes. ENT: No sore throat. Cardiovascular: Denies chest pain. Respiratory: Denies shortness of breath. Gastrointestinal: No abdominal pain.  No nausea, no vomiting.  No diarrhea.  No constipation. Genitourinary: Negative for dysuria. Musculoskeletal: Negative for back pain. Skin: Negative for rash. Neurological: Negative for headaches, focal weakness or numbness.  ____________________________________________   PHYSICAL EXAM:  VITAL SIGNS: ED Triage Vitals  Enc Vitals Group     BP      Pulse      Resp      Temp      Temp src      SpO2      Weight      Height      Head Circumference      Peak Flow      Pain Score      Pain Loc      Pain Edu?      Excl. in [place]?     Constitutional: Awake and alert, slow to respond to questions, lethargic. Eyes: Conjunctivae are normal. Head: Atraumatic. Nose: No congestion/rhinnorhea. Mouth/Throat: Mucous membranes are moist. Neck: Normal ROM Cardiovascular: Normal rate, regular rhythm. Grossly normal heart sounds. Respiratory: Normal respiratory effort.  No retractions. Lungs CTAB. Gastrointestinal: Soft and nontender. No distention. Genitourinary: deferred Musculoskeletal: No lower extremity tenderness nor edema. Neurologic:  Normal speech and language. No gross focal neurologic deficits are appreciated. Skin:  Skin is warm, dry and intact. No rash noted. Psychiatric: Mood and affect are normal. Speech and behavior are normal.  ____________________________________________   LABS (all labs ordered are listed, but only abnormal results  are displayed)  Labs Reviewed  COMPREHENSIVE METABOLIC PANEL - Abnormal; Notable for the following components:      Result Value   Glucose, Bld 112 (*)    BUN 29 (*)    Creatinine, Ser 2.10 (*)    GFR calc non Af Amer 33 (*)    GFR calc Af Amer 38 (*)    All other components within normal limits  CBC WITH DIFFERENTIAL/PLATELET - Abnormal; Notable for the following components:   Hemoglobin 12.9 (*)    HCT 37.7 (*)    All other components within normal limits  SARS CORONAVIRUS 2 (HOSPITAL ORDER, [place] [facility])  ETHANOL  URINALYSIS, COMPLETE (UACMP) WITH MICROSCOPIC  URINE DRUG SCREEN,  QUALITATIVE ([facility] ONLY)   ____________________________________________  EKG  ED ECG REPORT I, Blake Divine, the attending physician, personally viewed and interpreted this ECG.   Date: 02/19/2019  EKG Time: 22:19  Rate: 69  Rhythm: normal sinus rhythm  Axis: Normal  Intervals:none  ST&T Change: None, LVH    PROCEDURES  Procedure(s) performed (including Critical Care):  Procedures   ____________________________________________   INITIAL IMPRESSION / ASSESSMENT AND PLAN / ED COURSE       58 year old male with a history of dementia, HIV, hypertension, and substance abuse presents to the ED for altered mental status.  He is slow to respond and lethargic appearing, but displays no focal neurologic deficits on exam.  No apparent infectious process clinically, will assess with UA and chest x-ray.  Patient's family reportedly recently tested positive for COVID, however he never tested positive for COVID.  He denies any fevers, chills, cough, chest pain, or shortness of breath that would be concerning for this.  It is possible that substance abuse as a cause of his symptoms, although patient denies this.  Will check head CT, blood alcohol, UDS.  Patient turned over to Dr. Karma Greaser pending lab and imaging results.  Anticipate admission for altered mental status.       ____________________________________________   FINAL CLINICAL IMPRESSION(S) / ED DIAGNOSES  Final diagnoses:  Altered mental status, unspecified altered mental status type  Lethargy     ED Discharge Orders    None       Note:  This document was prepared using Dragon voice recognition software and may include unintentional dictation errors.   Blake Divine, MD 02/19/19 [phone]

## 2019-02-19 LAB — URINALYSIS, COMPLETE (UACMP) WITH MICROSCOPIC
Bacteria, UA: NONE SEEN
Bilirubin Urine: NEGATIVE
Glucose, UA: NEGATIVE mg/dL
Hgb urine dipstick: NEGATIVE
Ketones, ur: NEGATIVE mg/dL
Leukocytes,Ua: NEGATIVE
Nitrite: NEGATIVE
Protein, ur: 100 mg/dL — AB
Specific Gravity, Urine: 1.021 (ref 1.005–1.030)
pH: 5 (ref 5.0–8.0)

## 2019-02-19 LAB — URINE DRUG SCREEN, QUALITATIVE (ARMC ONLY)
Amphetamines, Ur Screen: NOT DETECTED
Barbiturates, Ur Screen: NOT DETECTED
Benzodiazepine, Ur Scrn: NOT DETECTED
Cannabinoid 50 Ng, Ur ~~LOC~~: NOT DETECTED
Cocaine Metabolite,Ur ~~LOC~~: NOT DETECTED
MDMA (Ecstasy)Ur Screen: NOT DETECTED
Methadone Scn, Ur: NOT DETECTED
Opiate, Ur Screen: NOT DETECTED
Phencyclidine (PCP) Ur S: NOT DETECTED
Tricyclic, Ur Screen: NOT DETECTED

## 2019-02-19 LAB — SARS CORONAVIRUS 2 BY RT PCR (HOSPITAL ORDER, PERFORMED IN ~~LOC~~ HOSPITAL LAB): SARS Coronavirus 2: NEGATIVE

## 2019-02-19 NOTE — Discharge Instructions (Signed)
As we discussed, Peter Cox work-up was very reassuring tonight.  We sent a urine culture because there were a few white blood cells in the urine but it does not appear consistent with an infection.  All of his blood work was consistent with his prior values, head CT and chest x-ray were reassuring, etc.  His blood pressure is elevated but looking back through his notes, his blood pressure is always similarly elevated.  Please follow-up with his primary care doctor at the next available opportunity.  Return to the emergency department if you develop new or worsening symptoms that concern you.

## 2019-02-19 NOTE — ED Notes (Signed)
Discharge paperwork reviewed with patient daughter. Daughter unable to come in to sign discharge due to Covid positive test. Daughter verbally states understanding of paperwork. Patient unable to sign do to mental status.

## 2019-02-20 LAB — URINE CULTURE: Special Requests: NORMAL

## 2019-03-06 MED FILL — BIKTARVY 50-200-25 MG TABS: 50-200-25 | 30 days supply | Qty: 30 | Fill #4

## 2019-04-02 MED FILL — BIKTARVY 50-200-25 MG TABS: 50-200-25 | 30 days supply | Qty: 30 | Fill #5

## 2019-04-24 ENCOUNTER — Other Ambulatory Visit: Payer: Self-pay | Admitting: Pharmacist

## 2019-04-24 DIAGNOSIS — B20 Human immunodeficiency virus [HIV] disease: Secondary | ICD-10-CM

## 2019-04-24 MED ORDER — BIKTARVY 50-200-25 MG PO TABS: 1.0000 | ORAL_TABLET | Freq: Every day | ORAL | 2 refills | Status: DC

## 2019-04-24 NOTE — Progress Notes (Signed)
Refills to [facility].

## 2019-04-25 MED FILL — BIKTARVY 50-200-25 MG TABS: 50-200-25 | 30 days supply | Qty: 30 | Fill #0

## 2019-05-10 ENCOUNTER — Encounter: Payer: Self-pay | Admitting: Emergency Medicine

## 2019-05-10 ENCOUNTER — Emergency Department: Payer: Medicare HMO

## 2019-05-10 ENCOUNTER — Emergency Department
Admission: EM | Admit: 2019-05-10 | Discharge: 2019-05-11 | Disposition: A | Discharge status: Home / Self Care | Payer: Medicare HMO | Attending: Emergency Medicine | Admitting: Emergency Medicine

## 2019-05-10 ENCOUNTER — Other Ambulatory Visit: Payer: Self-pay

## 2019-05-10 DIAGNOSIS — I129 Hypertensive chronic kidney disease with stage 1 through stage 4 chronic kidney disease, or unspecified chronic kidney disease: Secondary | ICD-10-CM | POA: Insufficient documentation

## 2019-05-10 DIAGNOSIS — N182 Chronic kidney disease, stage 2 (mild): Secondary | ICD-10-CM | POA: Insufficient documentation

## 2019-05-10 DIAGNOSIS — F0390 Unspecified dementia without behavioral disturbance: Secondary | ICD-10-CM | POA: Diagnosis not present

## 2019-05-10 DIAGNOSIS — F69 Unspecified disorder of adult personality and behavior: Secondary | ICD-10-CM

## 2019-05-10 DIAGNOSIS — Z046 Encounter for general psychiatric examination, requested by authority: Secondary | ICD-10-CM | POA: Diagnosis not present

## 2019-05-10 DIAGNOSIS — Z20828 Contact with and (suspected) exposure to other viral communicable diseases: Secondary | ICD-10-CM | POA: Diagnosis not present

## 2019-05-10 DIAGNOSIS — F03918 Unspecified dementia, unspecified severity, with other behavioral disturbance: Secondary | ICD-10-CM | POA: Diagnosis present

## 2019-05-10 DIAGNOSIS — F0281 Dementia in other diseases classified elsewhere with behavioral disturbance: Secondary | ICD-10-CM | POA: Diagnosis present

## 2019-05-10 DIAGNOSIS — Z79899 Other long term (current) drug therapy: Secondary | ICD-10-CM | POA: Insufficient documentation

## 2019-05-10 DIAGNOSIS — F919 Conduct disorder, unspecified: Secondary | ICD-10-CM | POA: Diagnosis present

## 2019-05-10 DIAGNOSIS — Z21 Asymptomatic human immunodeficiency virus [HIV] infection status: Secondary | ICD-10-CM | POA: Insufficient documentation

## 2019-05-10 DIAGNOSIS — F17210 Nicotine dependence, cigarettes, uncomplicated: Secondary | ICD-10-CM | POA: Insufficient documentation

## 2019-05-10 LAB — CBC
HCT: 38.8 % — ABNORMAL LOW (ref 39.0–52.0)
Hemoglobin: 12.8 g/dL — ABNORMAL LOW (ref 13.0–17.0)
MCH: 29.8 pg (ref 26.0–34.0)
MCHC: 33 g/dL (ref 30.0–36.0)
MCV: 90.2 fL (ref 80.0–100.0)
Platelets: 186 10*3/uL (ref 150–400)
RBC: 4.3 MIL/uL (ref 4.22–5.81)
RDW: 13.9 % (ref 11.5–15.5)
WBC: 5 10*3/uL (ref 4.0–10.5)
nRBC: 0 % (ref 0.0–0.2)

## 2019-05-10 LAB — COMPREHENSIVE METABOLIC PANEL
ALT: 13 U/L (ref 0–44)
AST: 14 U/L — ABNORMAL LOW (ref 15–41)
Albumin: 3.8 g/dL (ref 3.5–5.0)
Alkaline Phosphatase: 80 U/L (ref 38–126)
Anion gap: 8 (ref 5–15)
BUN: 35 mg/dL — ABNORMAL HIGH (ref 6–20)
CO2: 29 mmol/L (ref 22–32)
Calcium: 9.4 mg/dL (ref 8.9–10.3)
Chloride: 106 mmol/L (ref 98–111)
Creatinine, Ser: 2.56 mg/dL — ABNORMAL HIGH (ref 0.61–1.24)
GFR calc Af Amer: 30 mL/min — ABNORMAL LOW (ref >60)
GFR calc non Af Amer: 26 mL/min — ABNORMAL LOW (ref >60)
Glucose, Bld: 86 mg/dL (ref 70–99)
Potassium: 4.2 mmol/L (ref 3.5–5.1)
Sodium: 143 mmol/L (ref 135–145)
Total Bilirubin: 0.6 mg/dL (ref 0.3–1.2)
Total Protein: 6.9 g/dL (ref 6.5–8.1)

## 2019-05-10 LAB — ETHANOL: Alcohol, Ethyl (B): <10 mg/dL (ref <10)

## 2019-05-10 LAB — ACETAMINOPHEN LEVEL: Acetaminophen (Tylenol), Serum: <10 ug/mL — ABNORMAL LOW (ref 10–30)

## 2019-05-10 LAB — SALICYLATE LEVEL: Salicylate Lvl: <7 mg/dL (ref 2.8–30.0)

## 2019-05-10 MED ORDER — AMLODIPINE BESYLATE 5 MG PO TABS
10.0000 mg | ORAL_TABLET | Freq: Every day | ORAL | Status: DC
  Administered 2019-05-11: 10 mg via ORAL
  Filled 2019-05-10: qty 2

## 2019-05-10 MED ORDER — LABETALOL HCL 100 MG PO TABS
100.0000 mg | ORAL_TABLET | Freq: Two times a day (BID) | ORAL | Status: DC
  Administered 2019-05-11 (×2): 100 mg via ORAL
  Filled 2019-05-10 (×3): qty 1

## 2019-05-10 MED ORDER — ATORVASTATIN CALCIUM 20 MG PO TABS
20.0000 mg | ORAL_TABLET | Freq: Every day | ORAL | Status: DC
  Administered 2019-05-11: 20 mg via ORAL
  Filled 2019-05-10: qty 1

## 2019-05-10 MED ORDER — BICTEGRAVIR-EMTRICITAB-TENOFOV 50-200-25 MG PO TABS: 1.0000 | ORAL_TABLET | Freq: Every day | ORAL | Status: DC

## 2019-05-10 MED ORDER — DONEPEZIL HCL 5 MG PO TABS
10.0000 mg | ORAL_TABLET | Freq: Every day | ORAL | Status: DC
  Administered 2019-05-11: 10 mg via ORAL
  Filled 2019-05-10: qty 2
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
## 2019-05-10 NOTE — ED Notes (Signed)
Patient assigned to appropriate care area   Introduced self to pt  Patient oriented to unit/care area: Informed that, for their safety, care areas are designed for safety and visiting and phone hours explained to patient. Patient verbalizes understanding, and verbal contract for safety obtained  Environment secured    Patient is alert and oriented to name and place, he laughs when writer is asking him questions but he says he does not like living with his daughter. Patient laughs a lot and is incontinent. Patient is eating hs dinner a grilled chicken sandwich with fries he did not eat the carrots or celery

## 2019-05-10 NOTE — ED Notes (Signed)
Pt dressed out by this tech and Zach,NT. Pt dressed out into burgundy scrubs. Pt's daughter took home jacket, shoes, hat and socks.  Pt's daughter requested to throw away blue sweat shirt and blue gray pants. Pt came in a pull up and peri care and new pull up was placed onto pt by Thedore Mins, NT.

## 2019-05-10 NOTE — ED Notes (Signed)
Pt. Completed TTS interview via [place] machine.
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
## 2019-05-10 NOTE — ED Notes (Signed)
SOC talking to patient via TTS SOC.  Pt. Talking to TTS.
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
## 2019-05-10 NOTE — ED Notes (Signed)
Pt. Escorted by this nurse to CT and x-ray.  Pt. Calm and cooperative.  Pt. Was returned to Room #20A.

## 2019-05-11 DIAGNOSIS — F0390 Unspecified dementia without behavioral disturbance: Secondary | ICD-10-CM | POA: Diagnosis not present

## 2019-05-11 LAB — URINE DRUG SCREEN, QUALITATIVE (ARMC ONLY)
Amphetamines, Ur Screen: NOT DETECTED
Barbiturates, Ur Screen: NOT DETECTED
Benzodiazepine, Ur Scrn: NOT DETECTED
Cannabinoid 50 Ng, Ur ~~LOC~~: NOT DETECTED
Cocaine Metabolite,Ur ~~LOC~~: NOT DETECTED
MDMA (Ecstasy)Ur Screen: NOT DETECTED
Methadone Scn, Ur: NOT DETECTED
Opiate, Ur Screen: NOT DETECTED
Phencyclidine (PCP) Ur S: NOT DETECTED
Tricyclic, Ur Screen: NOT DETECTED

## 2019-05-11 LAB — URINALYSIS, COMPLETE (UACMP) WITH MICROSCOPIC
Bacteria, UA: NONE SEEN
Bilirubin Urine: NEGATIVE
Glucose, UA: NEGATIVE mg/dL
Hgb urine dipstick: NEGATIVE
Ketones, ur: NEGATIVE mg/dL
Leukocytes,Ua: NEGATIVE
Nitrite: NEGATIVE
Protein, ur: 100 mg/dL — AB
Specific Gravity, Urine: 1.016 (ref 1.005–1.030)
pH: 6 (ref 5.0–8.0)

## 2019-05-11 LAB — SARS CORONAVIRUS 2 (TAT 6-24 HRS): SARS Coronavirus 2: NEGATIVE

## 2019-05-11 MED ORDER — OLANZAPINE 5 MG PO TABS: 5.0000 mg | ORAL_TABLET | Freq: Every day | ORAL | 0 refills | Status: DC | PRN

## 2019-05-11 MED ORDER — DIVALPROEX SODIUM 125 MG PO CSDR: 250.0000 mg | DELAYED_RELEASE_CAPSULE | Freq: Two times a day (BID) | ORAL | 1 refills | Status: DC

## 2019-05-11 MED ORDER — OLANZAPINE 5 MG PO TABS: 5.0000 mg | ORAL_TABLET | Freq: Every day | ORAL | Status: DC | PRN

## 2019-05-11 MED ORDER — DIVALPROEX SODIUM 125 MG PO CSDR
250.0000 mg | DELAYED_RELEASE_CAPSULE | Freq: Two times a day (BID) | ORAL | Status: DC
  Filled 2019-05-11: qty 2

## 2019-05-11 MED ORDER — BICTEGRAVIR-EMTRICITAB-TENOFOV 50-200-25 MG PO TABS: 1.0000 | ORAL_TABLET | Freq: Every day | ORAL | Status: DC
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
## 2019-05-11 NOTE — ED Notes (Signed)
BEHAVIORAL HEALTH ROUNDING Patient sleeping: Yes.   Patient alert and oriented: eyes closed  Appears asleep Behavior appropriate: Yes.  ; If no, describe:  Nutrition and fluids offered: Yes  Toileting and hygiene offered: sleeping Sitter present: q 15 minute observations  Law enforcement present: yes  [employer] [place]

## 2019-05-11 NOTE — ED Notes (Signed)
Patient observed lying in bed with eyes closed  Even, unlabored respirations observed   NAD pt appears to be sleeping  I will continue to monitor along with every 15 minute visual observations     

## 2019-05-11 NOTE — ED Notes (Signed)
He is discharged   - NP and TTS talked with his daughter and she will be here after she gets off work at 1500

## 2019-05-11 NOTE — ED Notes (Signed)
BEHAVIORAL HEALTH ROUNDING Patient sleeping: Yes.   Patient alert and oriented: eyes closed  Appears asleep Behavior appropriate: Yes.  ; If no, describe:  Nutrition and fluids offered: Yes  Toileting and hygiene offered: sleeping Sitter present: q 15 minute observations  Law enforcement present: yes  [employer] officer  ENVIRONMENTAL ASSESSMENT Potentially harmful objects out of patient reach: Yes.   Personal belongings secured: Yes.   Patient dressed in hospital provided attire only: Yes.   Plastic bags out of patient reach: Yes.   Patient care equipment (cords, cables, call bells, lines, and drains) shortened, removed, or accounted for: Yes.   Equipment and supplies removed from bottom of stretcher: Yes.   Potentially toxic materials out of patient reach: Yes.   Sharps container removed or out of patient reach: Yes.

## 2019-05-11 NOTE — ED Notes (Addendum)
BEHAVIORAL HEALTH ROUNDING Patient sleeping: No. Patient alert : yes Behavior appropriate: Yes.  ; If no, describe:  Nutrition and fluids offered: yes Toileting and hygiene offered: Yes  Sitter present: q15 minute observations

## 2019-05-11 NOTE — ED Provider Notes (Addendum)
-----------------------------------------   7:05 AM on 05/11/2019 -----------------------------------------   Blood pressure (!) 184/82, pulse 61, temperature 97.9 F (36.6 C), temperature source Oral, resp. rate 16, SpO2 100 %.  The patient is calm and cooperative at this time.  There have been no acute events since the last update.  Awaiting disposition plan from Behavioral Medicine and/or Social Work team(s).    Merlyn Lot, MD 05/11/19 [phone]  The patient has been evaluated at bedside by Reita Cliche NP of psychiatry.  Patient is clinically stable.  Not felt to be a danger to self or others.  No SI or Hi.  No indication for inpatient psychiatric admission at this time. No indication for medical admission at this time either. Appropriate for continued outpatient therapy.    Merlyn Lot, MD 05/11/19 1052

## 2019-05-11 NOTE — Discharge Instructions (Signed)
Dementia Caregiver Guide Dementia is a term used to describe a number of symptoms that affect memory and thinking. The most common symptoms include:  Memory loss.  Trouble with language and communication.  Trouble concentrating.  Poor judgment.  Problems with reasoning.  Child-like behavior and language.  Extreme anxiety.  Angry outbursts.  Wandering from home or public places. Dementia usually gets worse slowly over time. In the early stages, people with dementia can stay independent and safe with some help. In later stages, they need help with daily tasks such as dressing, grooming, and using the bathroom. How to help the person with dementia cope Dementia can be frightening and confusing. Here are some tips to help the person with dementia cope with changes caused by the disease. General tips  Keep the person on track with his or her routine.  Try to identify areas where the person may need help.  Be supportive, patient, calm, and encouraging.  Gently remind the person that adjusting to changes takes time.  Help with the tasks that the person has asked for help with.  Keep the person involved in daily tasks and decisions as much as possible.  Encourage conversation, but try not to get frustrated or harried if the person struggles to find words or does not seem to appreciate your help. Communication tips  When the person is talking or seems frustrated, make eye contact and hold the person's hand.  Ask specific questions that need yes or no answers.  Use simple words, short sentences, and a calm voice. Only give one direction at a time.  When offering choices, limit them to just 1 or 2.  Avoid correcting the person in a negative way.  If the person is struggling to find the right words, gently try to help him or her. How to recognize symptoms of stress Symptoms of stress in caregivers include:  Feeling frustrated or angry with the person with dementia.   Denying that the person has dementia or that his or her symptoms will not improve.  Feeling hopeless and unappreciated.  Difficulty sleeping.  Difficulty concentrating.  Feeling anxious, irritable, or depressed.  Developing stress-related health problems.  Feeling like you have too little time for your own life. Follow these instructions at home:   Make sure that you and the person you are caring for: ? Get regular sleep. ? Exercise regularly. ? Eat regular, nutritious meals. ? Drink enough fluid to keep your urine clear or pale yellow. ? Take over-the-counter and prescription medicines only as told by your health care providers. ? Attend all scheduled health care appointments.  Join a support group with others who are caregivers.  Ask about respite care resources so that you can have a regular break from the stress of caregiving.  Look for signs of stress in yourself and in the person you are caring for. If you notice signs of stress, take steps to manage it.  Consider any safety risks and take steps to avoid them.  Organize medications in a pill box for each day of the week.  Create a plan to handle any legal or financial matters. Get legal or financial advice if needed.  Keep a calendar in a central location to remind the person of appointments or other activities. Tips for reducing the risk of injury  Keep floors clear of clutter. Remove rugs, magazine racks, and floor lamps.  Keep hallways well lit, especially at night.  Put a handrail and nonslip mat in the bathtub or   shower.  Put childproof locks on cabinets that contain dangerous items, such as medicines, alcohol, guns, toxic cleaning items, sharp tools or utensils, matches, and lighters.  Put the locks in places where the person cannot see or reach them easily. This will help ensure that the person does not wander out of the house and get lost.  Be prepared for emergencies. Keep a list of emergency phone  numbers and addresses in a convenient area.  Remove car keys and lock garage doors so that the person does not try to get in the car and drive.  Have the person wear a bracelet that tracks locations and identifies the person as having memory problems. This should be worn at all times for safety. Where to find support: Many individuals and organizations offer support. These include:  Support groups for people with dementia and for caregivers.  Counselors or therapists.  Home health care services.  Adult day care centers. Where to find more information Alzheimer's Association: www.alz.org Contact a health care provider if:  The person's health is rapidly getting worse.  You are no longer able to care for the person.  Caring for the person is affecting your physical and emotional health.  The person threatens himself or herself, you, or anyone else. Summary  Dementia is a term used to describe a number of symptoms that affect memory and thinking.  Dementia usually gets worse slowly over time.  Take steps to reduce the person's risk of injury, and to plan for future care.  Caregivers need support, relief from caregiving, and time for their own lives. This information is not intended to replace advice given to you by your health care provider. Make sure you discuss any questions you have with your health care provider. Document Released: 05/10/2016 Document Revised: 05/19/2017 Document Reviewed: 05/10/2016 Elsevier Patient Education  2020 Elsevier Inc.  

## 2019-05-11 NOTE — ED Notes (Signed)
ED  Is the patient under IVC or is there intent for IVC: voluntary   Is the patient medically cleared: Yes.   Is there vacancy in the ED BHU: Yes.   Is the population mix appropriate for patient:  yes Is the patient awaiting placement in inpatient or outpatient setting: Yes.  Social work consult pending  Has the patient had a psychiatric consult: Yes.   Survey of unit performed for contraband, proper placement and condition of furniture, tampering with fixtures in bathroom, shower, and each patient room: Yes.  ; Findings:  APPEARANCE/BEHAVIOR Calm and cooperative NEURO ASSESSMENT Orientation: oriented x3  Denies pain Hallucinations: No.None noted (Hallucinations) denies Speech: Normal Gait: normal RESPIRATORY ASSESSMENT Even  Unlabored respirations  CARDIOVASCULAR ASSESSMENT Pulses equal   regular rate  Skin warm and dry   GASTROINTESTINAL ASSESSMENT no GI complaint EXTREMITIES Full ROM  PLAN OF CARE Provide calm/safe environment. Vital signs assessed twice daily. ED BHU Assessment once each 12-hour shift. Collaborate with TTS daily or as condition indicates. Assure the ED provider has rounded once each shift. Provide and encourage hygiene. Provide redirection as needed. Assess for escalating behavior; address immediately and inform ED provider.  Assess family dynamic and appropriateness for visitation as needed: Yes.  ; If necessary, describe findings:  Educate the patient/family about BHU procedures/visitation: Yes.  ; If necessary, describe findings:
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
## 2019-05-14 ENCOUNTER — Telehealth: Payer: Self-pay

## 2019-05-14 NOTE — Telephone Encounter (Signed)
Would you like him to come in for a face to face visit?  Thanks,   -Mickel Baas

## 2019-05-14 NOTE — Telephone Encounter (Signed)
I think we can do a face-to-face virtually now due to the pandemic If they can do video visit, that would be better than in person for them probably. Definitiely needs a visit though

## 2019-05-14 NOTE — Telephone Encounter (Signed)
Doxy visit made for 05/21/2019 at 9:40am  Thanks,   -Mickel Baas

## 2019-05-14 NOTE — Telephone Encounter (Signed)
Copied from [place] [id number]. Topic: General - Inquiry >> May 14, 2019  8:27 AM Virl Axe D wrote: Reason for CRM: Pt's daughter stated she needs to have an FL2 filled out for pt to be placed in an assisted living or nursing home. She is not sure where to start and would like a Cb from Dr. Nancy Nordmann assistant. Please advise.

## 2019-05-21 ENCOUNTER — Encounter: Payer: Self-pay | Admitting: Family Medicine

## 2019-05-21 ENCOUNTER — Ambulatory Visit: Payer: Self-pay | Admitting: *Deleted

## 2019-05-21 ENCOUNTER — Ambulatory Visit (INDEPENDENT_AMBULATORY_CARE_PROVIDER_SITE_OTHER): Payer: Medicare HMO | Admitting: Family Medicine

## 2019-05-21 DIAGNOSIS — F1911 Other psychoactive substance abuse, in remission: Secondary | ICD-10-CM

## 2019-05-21 DIAGNOSIS — R32 Unspecified urinary incontinence: Secondary | ICD-10-CM | POA: Diagnosis not present

## 2019-05-21 DIAGNOSIS — F1010 Alcohol abuse, uncomplicated: Secondary | ICD-10-CM

## 2019-05-21 DIAGNOSIS — F0281 Dementia in other diseases classified elsewhere with behavioral disturbance: Secondary | ICD-10-CM

## 2019-05-21 DIAGNOSIS — I10 Essential (primary) hypertension: Secondary | ICD-10-CM

## 2019-05-21 DIAGNOSIS — B20 Human immunodeficiency virus [HIV] disease: Secondary | ICD-10-CM

## 2019-05-21 DIAGNOSIS — Z789 Other specified health status: Secondary | ICD-10-CM

## 2019-05-21 DIAGNOSIS — F02818 Dementia in other diseases classified elsewhere, unspecified severity, with other behavioral disturbance: Secondary | ICD-10-CM

## 2019-05-21 DIAGNOSIS — Z8673 Personal history of transient ischemic attack (TIA), and cerebral infarction without residual deficits: Secondary | ICD-10-CM

## 2019-05-21 MED ORDER — DONEPEZIL HCL 10 MG PO TABS: ORAL_TABLET | ORAL | 11 refills | Status: DC

## 2019-05-21 MED ORDER — OLANZAPINE 5 MG PO TABS: 5.0000 mg | ORAL_TABLET | Freq: Every day | ORAL | 3 refills | Status: DC | PRN

## 2019-05-21 MED ORDER — DIVALPROEX SODIUM 125 MG PO CSDR: 250.0000 mg | DELAYED_RELEASE_CAPSULE | Freq: Two times a day (BID) | ORAL | 2 refills | Status: DC

## 2019-05-21 MED FILL — OLANZapine 5 MG TABS: 5 | 30 days supply | Qty: 30 | Fill #0

## 2019-05-21 MED FILL — DONEPEZIL HCL 10 MG TABLET: 10 | 30 days supply | Qty: 30 | Fill #0

## 2019-05-21 MED FILL — DIVALPROEX SODIUM 125 MG CA: 125 | 15 days supply | Qty: 60 | Fill #0

## 2019-05-21 MED FILL — BIKTARVY 50-200-25 MG TABS: 50-200-25 | 30 days supply | Qty: 30 | Fill #1
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
## 2019-05-22 NOTE — Chronic Care Management (AMB) (Signed)
  Chronic Care Management    Clinical Social Work Follow Up Note  05/22/2019 Name: Peter Cox MRN: [id number] DOB: [birth year]  Peter Cox is a 58 y.o. year old male who is a primary care patient of Peter Cox, Peter Bucy, MD. The CCM team was consulted for assistance with Level of Care Concerns.   Review of patient status, including review of consultants reports, other relevant assessments, and collaboration with appropriate care team members and the patient's provider was performed as part of comprehensive patient evaluation and provision of chronic care management services.    Advanced Directives Status: <no information> See Care Plan for related entries.   Outpatient Encounter Medications as of 05/21/2019  Medication Sig  . amLODipine (NORVASC) 10 MG tablet Take 1 tablet (10 mg total) by mouth daily.  Marland Kitchen atorvastatin (LIPITOR) 20 MG tablet Take 20 mg by mouth daily.  . bictegravir-emtricitabine-tenofovir AF (BIKTARVY) 50-200-25 MG TABS tablet Take 1 tablet by mouth daily.  . divalproex (DEPAKOTE SPRINKLE) 125 MG capsule Take 2 capsules (250 mg total) by mouth every 12 (twelve) hours.  Marland Kitchen donepezil (ARICEPT) 10 MG tablet Take 1/2 tablet daily for 2 weeks, then increase to 1 tablet daily  . labetalol (NORMODYNE) 100 MG tablet Take 1 tablet (100 mg total) by mouth 2 (two) times daily.  Marland Kitchen OLANZapine (ZYPREXA) 5 MG tablet Take 1 tablet (5 mg total) by mouth daily as needed (agitation).   No facility-administered encounter medications on file as of 05/21/2019.      Goals Addressed            This Visit's Progress   . "I need help finding placement for my father" (pt-stated)       Current Barriers:  . Level of care concerns  Clinical Social Work Clinical Goal(s):  Marland Kitchen Over the next 90 days, patient will work with SW to address concerns related to placement in a memory care facility  Interventions: . Caregiver interviewed due to patient's Dementia and appropriate assessments performed  . Provided caregiver with information about level of care options and placement process . Collaboration phone call from patient's provider to discuss patient's placement needs . Discussed plans with caregiver for ongoing care management follow up and provided patient with direct contact information for care management team   Patient Self Care Activities:  . Attends all scheduled provider appointments . Unable to perform ADLs independently . Unable to perform IADLs independently  Initial goal documentation         Follow Up Plan: SW will follow up with patient by phone over the next 2 weeks    [place], [place] Worker  [facility] [phone]

## 2019-05-22 NOTE — Patient Instructions (Addendum)
Thank you allowing the Chronic Care Management Team to be a part of your care! It was a pleasure speaking with you today!  1. Please call this social worker with questions or concerns regarding your placement needs  CCM (Chronic Care Management) Team   Neldon Labella RN, BSN Nurse Care Coordinator  [phone]  Ruben Reason PharmD  Clinical Pharmacist  [phone]   [place], LCSW Clinical Social Worker [phone]  Goals Addressed            This Visit's Progress   . "I need help finding placement for my father" (pt-stated)       Current Barriers:  . Level of care concerns  Clinical Social Work Clinical Goal(s):  Marland Kitchen Over the next 90 days, patient will work with SW to address concerns related to placement in a memory care facility  Interventions: . Caregiver interviewed due to patient's Dementia and appropriate assessments performed . Provided caregiver with information about level of care options and placement process . Collaboration phone call from patient's provider to discuss patient's placement needs . Discussed plans with caregiver for ongoing care management follow up and provided patient with direct contact information for care management team   Patient Self Care Activities:  . Attends all scheduled provider appointments . Unable to perform ADLs independently . Unable to perform IADLs independently  Initial goal documentation         The patient verbalized understanding of instructions provided today and declined a print copy of patient instruction materials.   The care management team will reach out to the patient again over the next 14 business days days.

## 2019-05-23 ENCOUNTER — Ambulatory Visit (INDEPENDENT_AMBULATORY_CARE_PROVIDER_SITE_OTHER): Payer: Medicare HMO | Admitting: *Deleted

## 2019-05-23 DIAGNOSIS — Z8673 Personal history of transient ischemic attack (TIA), and cerebral infarction without residual deficits: Secondary | ICD-10-CM

## 2019-05-23 DIAGNOSIS — F0281 Dementia in other diseases classified elsewhere with behavioral disturbance: Secondary | ICD-10-CM | POA: Diagnosis not present

## 2019-05-23 DIAGNOSIS — F02818 Dementia in other diseases classified elsewhere, unspecified severity, with other behavioral disturbance: Secondary | ICD-10-CM

## 2019-05-23 DIAGNOSIS — I10 Essential (primary) hypertension: Secondary | ICD-10-CM | POA: Diagnosis not present

## 2019-05-23 NOTE — Chronic Care Management (AMB) (Signed)
  Chronic Care Management    Clinical Social Work Follow Up Note  05/23/2019 Name: Peter Cox MRN: [id number] DOB: August 19, 1960  VALOR Cox is a 58 y.o. year old male who is a primary care patient of Cox, Peter Bucy, MD. The CCM team was consulted for assistance with Level of Care Concerns.   Review of patient status, including review of consultants reports, other relevant assessments, and collaboration with appropriate care team members and the patient's provider was performed as part of comprehensive patient evaluation and provision of chronic care management services.     Advanced Directives Status: <no information> See Care Plan for related entries.   Outpatient Encounter Medications as of 05/23/2019  Medication Sig  . amLODipine (NORVASC) 10 MG tablet Take 1 tablet (10 mg total) by mouth daily.  Peter Cox atorvastatin (LIPITOR) 20 MG tablet Take 20 mg by mouth daily.  . bictegravir-emtricitabine-tenofovir AF (BIKTARVY) 50-200-25 MG TABS tablet Take 1 tablet by mouth daily.  . divalproex (DEPAKOTE SPRINKLE) 125 MG capsule Take 2 capsules (250 mg total) by mouth every 12 (twelve) hours.  Peter Cox donepezil (ARICEPT) 10 MG tablet Take 1/2 tablet daily for 2 weeks, then increase to 1 tablet daily  . labetalol (NORMODYNE) 100 MG tablet Take 1 tablet (100 mg total) by mouth 2 (two) times daily.  Peter Cox OLANZapine (ZYPREXA) 5 MG tablet Take 1 tablet (5 mg total) by mouth daily as needed (agitation).   No facility-administered encounter medications on file as of 05/23/2019.      Goals Addressed            This Visit's Progress   . "I need help finding placement for my father" (pt-stated)       Current Barriers:  . Level of care concerns associated with Dementia and History of CVA  Clinical Social Work Clinical Goal(s):  Peter Cox Over the next 90 days, patient will work with SW to address concerns related to placement in a memory care facility  Interventions: . Caregiver interviewed due to patient's  Dementia and appropriate assessments performed . Provided caregiver with information about level of care options and placement process . Patient's daughter informed that she will have to apply for long term medicaid before placement can occur . Collaborated with the following facilities to discuss bed availability: . [facility] No availabilities . [facility]-[phone] No availabilities but agrees to review FL2 fax [phone] . [phone] [facility] Has availabilities, will send FL2 fax [phone] . [place] [facility]-[phone] Rosario Adie, left voicemail message . [facility] and [phone] [facility] availabilities . [phone]-[facility] has availabilities-will fax FL2 and H&P for review [phone] . Discussed plans with caregiver for ongoing care management follow up and provided patient with direct contact information for care management team   Patient Self Care Activities:  . Attends all scheduled provider appointments . Unable to perform ADLs independently . Unable to perform IADLs independently  Please see past updates related to this goal by clicking on the "Past Updates" button in the selected goal          Follow Up Plan: SW will follow up with patient by phone over the next 2 weeks    [place], [place] Worker  [place] [facility] [phone]

## 2019-05-23 NOTE — Patient Instructions (Signed)
Thank you allowing the Chronic Care Management Team to be a part of your care! It was a pleasure speaking with you today!  1. Please call this social worker with any questions or concerns related to patient's level of care needs.  CCM (Chronic Care Management) Team   Neldon Labella RN, BSN Nurse Care Coordinator  [phone]  Ruben Reason PharmD  Clinical Pharmacist  [phone]   Elliot Gurney, LCSW Clinical Social Worker [phone]  Goals Addressed            This Visit's Progress   . "I need help finding placement for my father" (pt-stated)       Current Barriers:  . Level of care concerns associated with Dementia and History of CVA  Clinical Social Work Clinical Goal(s):  Marland Kitchen Over the next 90 days, patient will work with SW to address concerns related to placement in a memory care facility  Interventions: . Caregiver interviewed due to patient's Dementia and appropriate assessments performed . Provided caregiver with information about level of care options and placement process . Patient's daughter informed that she will have to apply for long term medicaid before placement can occur . Collaborated with the following facilities to discuss bed availability: . [facility] No availabilities . [phone] No availabilities but agrees to review FL2 fax [phone] . [phone] [facility] Has availabilities, will send FL2 fax [phone] . [place] [facility]-[phone] Rosario Adie, left voicemail message . [facility] and [phone] [facility] availabilities . [facility]-[phone]-[facility] has availabilities-will fax FL2 and H&P for review [phone] . Discussed plans with caregiver for ongoing care management follow up and provided patient with direct contact information for care management team   Patient Self Care Activities:  . Attends all scheduled provider  appointments . Unable to perform ADLs independently . Unable to perform IADLs independently  Please see past updates related to this goal by clicking on the "Past Updates" button in the selected goal          The patient verbalized understanding of instructions provided today and declined a print copy of patient instruction materials.   The care management team will reach out to the patient again over the next 14 days days.

## 2019-05-24 ENCOUNTER — Ambulatory Visit: Payer: Self-pay | Admitting: *Deleted

## 2019-05-24 DIAGNOSIS — F0281 Dementia in other diseases classified elsewhere with behavioral disturbance: Secondary | ICD-10-CM

## 2019-05-24 DIAGNOSIS — Z8673 Personal history of transient ischemic attack (TIA), and cerebral infarction without residual deficits: Secondary | ICD-10-CM

## 2019-05-24 DIAGNOSIS — F02818 Dementia in other diseases classified elsewhere, unspecified severity, with other behavioral disturbance: Secondary | ICD-10-CM

## 2019-05-27 NOTE — Patient Instructions (Signed)
Thank you allowing the Chronic Care Management Team to be a part of your care! It was a pleasure speaking with you today!  1. Please call this social worker with questions or concerns regarding patient's level of care needs.   CCM (Chronic Care Management) Team   Neldon Labella RN, BSN Nurse Care Coordinator  [phone]  Ruben Reason PharmD  Clinical Pharmacist  [phone]   Elliot Gurney, LCSW Clinical Social Worker [phone]  Goals Addressed            This Visit's Progress   . "I need help finding placement for my father" (pt-stated)       Current Barriers:  . Level of care concerns associated with Dementia and History of CVA  Clinical Social Work Clinical Goal(s):  Marland Kitchen Over the next 90 days, patient will work with SW to address concerns related to placement in a memory care facility  Interventions: . Discussed with  caregiver who agrees with the possibility of patient going in for short term rehab to ultimately transition to long term care  . Caregiver confirms that she has spoken with the Department of Social Services regarding the start of the long term care medicaid application . Collaborated with [employer] [facility]-[phone]-Deborah has availabilities-will fax FL2 and H&P for review [phone] for short term rehab. [facility] confirms that they will review the [facility] and will submit authorization if accepted . Discussed plans with caregiver for ongoing care management follow up and provided patient with direct contact information for care management team   Patient Self Care Activities:  . Attends all scheduled provider appointments . Unable to perform ADLs independently . Unable to perform IADLs independently  Please see past updates related to this goal by clicking on the "Past Updates" button in the selected goal          The patient verbalized understanding of instructions provided today and declined a print copy of patient instruction  materials.   The care management team will reach out to the patient again over the next 14 days.

## 2019-05-27 NOTE — Chronic Care Management (AMB) (Signed)
  Chronic Care Management    Clinical Social Work Follow Up Note  05/27/2019 Name: Peter Cox MRN: [id number] DOB: [birth year]  Peter Cox is a 58 y.o. year old male who is a primary care patient of Bacigalupo, Dionne Bucy, MD. The CCM team was consulted for assistance with Level of Care Concerns.   Review of patient status, including review of consultants reports, other relevant assessments, and collaboration with appropriate care team members and the patient's provider was performed as part of comprehensive patient evaluation and provision of chronic care management services.    Advanced Directives Status: <no information> See Care Plan for related entries.   Outpatient Encounter Medications as of 05/24/2019  Medication Sig  . amLODipine (NORVASC) 10 MG tablet Take 1 tablet (10 mg total) by mouth daily.  Marland Kitchen atorvastatin (LIPITOR) 20 MG tablet Take 20 mg by mouth daily.  . bictegravir-emtricitabine-tenofovir AF (BIKTARVY) 50-200-25 MG TABS tablet Take 1 tablet by mouth daily.  . divalproex (DEPAKOTE SPRINKLE) 125 MG capsule Take 2 capsules (250 mg total) by mouth every 12 (twelve) hours.  Marland Kitchen donepezil (ARICEPT) 10 MG tablet Take 1/2 tablet daily for 2 weeks, then increase to 1 tablet daily  . labetalol (NORMODYNE) 100 MG tablet Take 1 tablet (100 mg total) by mouth 2 (two) times daily.  Marland Kitchen OLANZapine (ZYPREXA) 5 MG tablet Take 1 tablet (5 mg total) by mouth daily as needed (agitation).   No facility-administered encounter medications on file as of 05/24/2019.      Goals Addressed   None      Follow Up Plan: SW will follow up with patient's caregiver by phone over the next 2 weeks    Tyquasia Pant, [place] Worker  [facility] [phone]

## 2019-05-28 ENCOUNTER — Ambulatory Visit: Payer: Self-pay | Admitting: *Deleted

## 2019-05-28 DIAGNOSIS — Z8673 Personal history of transient ischemic attack (TIA), and cerebral infarction without residual deficits: Secondary | ICD-10-CM

## 2019-05-28 DIAGNOSIS — F02818 Dementia in other diseases classified elsewhere, unspecified severity, with other behavioral disturbance: Secondary | ICD-10-CM

## 2019-05-28 DIAGNOSIS — F0281 Dementia in other diseases classified elsewhere with behavioral disturbance: Secondary | ICD-10-CM

## 2019-05-28 NOTE — Chronic Care Management (AMB) (Signed)
  Chronic Care Management    Clinical Social Work Follow Up Note  05/28/2019 Name: Peter Cox MRN: [id number] DOB: [birth year]  BRYDAN DOWNARD is a 58 y.o. year old male who is a primary care patient of Bacigalupo, Dionne Bucy, MD. The CCM team was consulted for assistance with Level of Care Concerns.   Review of patient status, including review of consultants reports, other relevant assessments, and collaboration with appropriate care team members and the patient's provider was performed as part of comprehensive patient evaluation and provision of chronic care management services.    Advanced Directives Status: <no information> See Care Plan for related entries.   Outpatient Encounter Medications as of 05/28/2019  Medication Sig  . amLODipine (NORVASC) 10 MG tablet Take 1 tablet (10 mg total) by mouth daily.  Marland Kitchen atorvastatin (LIPITOR) 20 MG tablet Take 20 mg by mouth daily.  . bictegravir-emtricitabine-tenofovir AF (BIKTARVY) 50-200-25 MG TABS tablet Take 1 tablet by mouth daily.  . divalproex (DEPAKOTE SPRINKLE) 125 MG capsule Take 2 capsules (250 mg total) by mouth every 12 (twelve) hours.  Marland Kitchen donepezil (ARICEPT) 10 MG tablet Take 1/2 tablet daily for 2 weeks, then increase to 1 tablet daily  . labetalol (NORMODYNE) 100 MG tablet Take 1 tablet (100 mg total) by mouth 2 (two) times daily.  Marland Kitchen OLANZapine (ZYPREXA) 5 MG tablet Take 1 tablet (5 mg total) by mouth daily as needed (agitation).   No facility-administered encounter medications on file as of 05/28/2019.      Goals Addressed            This Visit's Progress   . "I need help finding placement for my father" (pt-stated)       Current Barriers:  . Level of care concerns associated with Dementia and History of CVA  Clinical Social Work Clinical Goal(s):  Marland Kitchen Over the next 90 days, patient will work with SW to address concerns related to placement in a memory care facility  Interventions: . Patient's FL2 and required clinicals  faxed to [phone]-Deborah h for review [phone] for short term rehab as well as [place] .    Patient Self Care Activities:  . Attends all scheduled provider appointments . Unable to perform ADLs independently . Unable to perform IADLs independently  Please see past updates related to this goal by clicking on the "Past Updates" button in the selected goal          Follow Up Plan: SW will follow up with patient's caregiver by phone over the next 2 weeks regarding status of placement    Wallis Spizzirri, [place] Worker  [facility] [phone]

## 2019-05-29 ENCOUNTER — Telehealth: Payer: Self-pay | Admitting: *Deleted

## 2019-05-29 ENCOUNTER — Telehealth: Payer: Self-pay

## 2019-05-29 ENCOUNTER — Ambulatory Visit: Payer: Self-pay | Admitting: *Deleted

## 2019-05-29 DIAGNOSIS — I10 Essential (primary) hypertension: Secondary | ICD-10-CM | POA: Diagnosis not present

## 2019-05-29 DIAGNOSIS — F0281 Dementia in other diseases classified elsewhere with behavioral disturbance: Secondary | ICD-10-CM

## 2019-05-29 DIAGNOSIS — Z8673 Personal history of transient ischemic attack (TIA), and cerebral infarction without residual deficits: Secondary | ICD-10-CM

## 2019-05-29 DIAGNOSIS — F02818 Dementia in other diseases classified elsewhere, unspecified severity, with other behavioral disturbance: Secondary | ICD-10-CM

## 2019-05-29 NOTE — Chronic Care Management (AMB) (Signed)
  Chronic Care Management    Clinical Social Work Follow Up Note  05/29/2019 Name: KEEVON HENNEY MRN: [id number] DOB: 06/26/1960  KRISTOF NADEEM is a 58 y.o. year old male who is a primary care patient of Bacigalupo, Dionne Bucy, MD. The CCM team was consulted for assistance with Level of Care Concerns.   Review of patient status, including review of consultants reports, other relevant assessments, and collaboration with appropriate care team members and the patient's provider was performed as part of comprehensive patient evaluation and provision of chronic care management services.    Advanced Directives Status: <no information> See Care Plan for related entries.   Outpatient Encounter Medications as of 05/29/2019  Medication Sig  . amLODipine (NORVASC) 10 MG tablet Take 1 tablet (10 mg total) by mouth daily.  Marland Kitchen atorvastatin (LIPITOR) 20 MG tablet Take 20 mg by mouth daily.  . bictegravir-emtricitabine-tenofovir AF (BIKTARVY) 50-200-25 MG TABS tablet Take 1 tablet by mouth daily.  . divalproex (DEPAKOTE SPRINKLE) 125 MG capsule Take 2 capsules (250 mg total) by mouth every 12 (twelve) hours.  Marland Kitchen donepezil (ARICEPT) 10 MG tablet Take 1/2 tablet daily for 2 weeks, then increase to 1 tablet daily  . labetalol (NORMODYNE) 100 MG tablet Take 1 tablet (100 mg total) by mouth 2 (two) times daily.  Marland Kitchen OLANZapine (ZYPREXA) 5 MG tablet Take 1 tablet (5 mg total) by mouth daily as needed (agitation).   No facility-administered encounter medications on file as of 05/29/2019.      Goals Addressed            This Visit's Progress   . "I need help finding placement for my father" (pt-stated)       Current Barriers:  . Level of care concerns associated with Dementia and History of CVA  Clinical Social Work Clinical Goal(s):  Marland Kitchen Over the next 90 days, patient will work with SW to address concerns related to placement in a memory care facility  Interventions:  Claiborne Billings from [facility]  confirmed that she has received and reviewed patient's Fl2 but would not be able to offer a bed . Patient's FL2 and required clinicals faxed to [phone] and remains pending . Patient's Fl2 and required clinical faxed to [facility] for review .    Patient Self Care Activities:  . Attends all scheduled provider appointments . Unable to perform ADLs independently . Unable to perform IADLs independently  Please see past updates related to this goal by clicking on the "Past Updates" button in the selected goal          Follow Up Plan: SW will follow up with patient by phone over the next 2 weeks    [place], [place] Worker  [facility] [phone]

## 2019-05-29 NOTE — Telephone Encounter (Signed)
Source Subject Topic  Peter Cox, Peter Cox (Patient) Peter Cox (Patient) General - Inquiry  Summary: Phoebe Perch form/ speak with nurse  Reason for CRM: Pt's daughter called in stating she is needing a call back from the social worker in regards to the [facility] form. Pt is also wanting a call back from the nurse in regards to using fathers medicaid to receive physical therapy assistance. Daughter is a bit irritated and is wondering if office had forgotten to reach back in contact with her in regards to these concerns. Please advise and call back.

## 2019-05-29 NOTE — Telephone Encounter (Signed)
From [facility] 

## 2019-05-30 ENCOUNTER — Ambulatory Visit: Payer: Self-pay | Admitting: *Deleted

## 2019-05-30 DIAGNOSIS — Z8673 Personal history of transient ischemic attack (TIA), and cerebral infarction without residual deficits: Secondary | ICD-10-CM

## 2019-05-30 DIAGNOSIS — F0281 Dementia in other diseases classified elsewhere with behavioral disturbance: Secondary | ICD-10-CM

## 2019-05-30 DIAGNOSIS — F02818 Dementia in other diseases classified elsewhere, unspecified severity, with other behavioral disturbance: Secondary | ICD-10-CM

## 2019-05-30 NOTE — Telephone Encounter (Signed)
Is she requesting [place] [facility]?  Peter Cox has bee nworking hard on the FL2 and placement.

## 2019-05-30 NOTE — Chronic Care Management (AMB) (Addendum)
  Chronic Care Management    Clinical Social Work Follow Up Note  05/30/2019 Name: Peter Cox MRN: [id number] DOB: [birth year]  Peter Cox is a 58 y.o. year old male who is a primary care patient of Bacigalupo, Dionne Bucy, MD. The CCM team was consulted for assistance with Level of Care Concerns.   Review of patient status, including review of consultants reports, other relevant assessments, and collaboration with appropriate care team members and the patient's provider was performed as part of comprehensive patient evaluation and provision of chronic care management services.    Advanced Directives Status: <no information> See Care Plan for related entries.   Outpatient Encounter Medications as of 05/30/2019  Medication Sig  . amLODipine (NORVASC) 10 MG tablet Take 1 tablet (10 mg total) by mouth daily.  Marland Kitchen atorvastatin (LIPITOR) 20 MG tablet Take 20 mg by mouth daily.  . bictegravir-emtricitabine-tenofovir AF (BIKTARVY) 50-200-25 MG TABS tablet Take 1 tablet by mouth daily.  . divalproex (DEPAKOTE SPRINKLE) 125 MG capsule Take 2 capsules (250 mg total) by mouth every 12 (twelve) hours.  Marland Kitchen donepezil (ARICEPT) 10 MG tablet Take 1/2 tablet daily for 2 weeks, then increase to 1 tablet daily  . labetalol (NORMODYNE) 100 MG tablet Take 1 tablet (100 mg total) by mouth 2 (two) times daily.  Marland Kitchen OLANZapine (ZYPREXA) 5 MG tablet Take 1 tablet (5 mg total) by mouth daily as needed (agitation).   No facility-administered encounter medications on file as of 05/30/2019.     Goals Addressed            This Visit's Progress   . "I need help finding placement for my father" (pt-stated)       Current Barriers:  . Level of care concerns associated with Dementia and History of CVA  Clinical Social Work Clinical Goal(s):  Marland Kitchen Over the next 90 days, patient will work with SW to address concerns related to placement in a memory care facility  Interventions:  Claiborne Billings from [facility]  confirmed that she has received and reviewed patient's Fl2 but would not be able to offer a bed . Neoma Laming from [facility] has declined patient due to flight risk, no bed offer obtained . Contacted Sharon at [facility], patient discussed. Per admissions Director, she has a availability but will need to review patient's clinicals . Patient's Fl2 and required clinical faxed to [facility] and [facility] in review . [place] confirmed to not be taking patient's due to COVID-19 . [facility] has no medicaid beds .    Patient Self Care Activities:  . Attends all scheduled provider appointments . Unable to perform ADLs independently . Unable to perform IADLs independently  Please see past updates related to this goal by clicking on the "Past Updates" button in the selected goal          Follow Up Plan: SW will follow up with patient by phone over the next 2 weeks regarding placement in long term care    [place], [place] Worker  [facility] Management [phone]

## 2019-05-30 NOTE — Patient Instructions (Signed)
Thank you allowing the Chronic Care Management Team to be a part of your care! It was a pleasure speaking with you today!  1. Please call this social worker with any questions or concerns regarding patient's placement needs.  CCM (Chronic Care Management) Team   Neldon Labella RN, BSN Nurse Care Coordinator  [phone]  Ruben Reason PharmD  Clinical Pharmacist  [phone]   Elliot Gurney, LCSW Clinical Social Worker [phone]  Goals Addressed            This Visit's Progress   . "I need help finding placement for my father" (pt-stated)       Current Barriers:  . Level of care concerns associated with Dementia and History of CVA  Clinical Social Work Clinical Goal(s):  Marland Kitchen Over the next 90 days, patient will work with SW to address concerns related to placement in a memory care facility  Interventions:  . This Education officer, museum contacted patient's daughter to discuss placement process and provide update on bed offers . This social worker explained that placement may be difficult to identify for patient due to strong recommendation that patient requires care in a locked unit . Emotional support provided to patient's daughter due to delay in finding placement  . Positive reinforcement provided to daughter for efforts made to provide care for patient. .    Patient Self Care Activities:  . Attends all scheduled provider appointments . Unable to perform ADLs independently . Unable to perform IADLs independently  Please see past updates related to this goal by clicking on the "Past Updates" button in the selected goal          The patient verbalized understanding of instructions provided today and declined a print copy of patient instruction materials.   The care management team will reach out to the patient again over the next 14 days.

## 2019-05-30 NOTE — Chronic Care Management (AMB) (Signed)
  Chronic Care Management    Clinical Social Work Follow Up Note  05/30/2019 Name: ASHVIN ADELSON MRN: [id number] DOB: 12-Feb-1961  DEMARRION MEIKLEJOHN is a 58 y.o. year old male who is a primary care patient of Bacigalupo, Dionne Bucy, MD. The CCM team was consulted for assistance with Level of Care Concerns.   Review of patient status, including review of consultants reports, other relevant assessments, and collaboration with appropriate care team members and the patient's provider was performed as part of comprehensive patient evaluation and provision of chronic care management services.    Advanced Directives Status: <no information> See Care Plan for related entries.   Outpatient Encounter Medications as of 05/30/2019  Medication Sig  . amLODipine (NORVASC) 10 MG tablet Take 1 tablet (10 mg total) by mouth daily.  Marland Kitchen atorvastatin (LIPITOR) 20 MG tablet Take 20 mg by mouth daily.  . bictegravir-emtricitabine-tenofovir AF (BIKTARVY) 50-200-25 MG TABS tablet Take 1 tablet by mouth daily.  . divalproex (DEPAKOTE SPRINKLE) 125 MG capsule Take 2 capsules (250 mg total) by mouth every 12 (twelve) hours.  Marland Kitchen donepezil (ARICEPT) 10 MG tablet Take 1/2 tablet daily for 2 weeks, then increase to 1 tablet daily  . labetalol (NORMODYNE) 100 MG tablet Take 1 tablet (100 mg total) by mouth 2 (two) times daily.  Marland Kitchen OLANZapine (ZYPREXA) 5 MG tablet Take 1 tablet (5 mg total) by mouth daily as needed (agitation).   No facility-administered encounter medications on file as of 05/30/2019.     Goals Addressed            This Visit's Progress   . "I need help finding placement for my father" (pt-stated)       Current Barriers:  . Level of care concerns associated with Dementia and History of CVA  Clinical Social Work Clinical Goal(s):  Marland Kitchen Over the next 90 days, patient will work with SW to address concerns related to placement in a memory care facility  Interventions:  . This Education officer, museum contacted  patient's daughter to discuss placement process and provide update on bed offers . This social worker explained that placement may be difficult to identify for patient due to strong recommendation that patient requires care in a locked unit . Emotional support provided to patient's daughter due to delay in finding placement  . Positive reinforcement provided to daughter for efforts made to provide care for patient. .    Patient Self Care Activities:  . Attends all scheduled provider appointments . Unable to perform ADLs independently . Unable to perform IADLs independently  Please see past updates related to this goal by clicking on the "Past Updates" button in the selected goal          Follow Up Plan: SW will follow up with patient by phone over the next 2 weeks    [place], [place] Worker  [facility] [phone]

## 2019-05-30 NOTE — Telephone Encounter (Signed)
Spoke to [employer] (daughter) requesting an update to admission to a medical and rehab facility. Mrs. Peter Cox aware that social worker Chrystal Jenita Seashore is working on admission.

## 2019-06-03 ENCOUNTER — Ambulatory Visit: Payer: Self-pay | Admitting: *Deleted

## 2019-06-03 DIAGNOSIS — F02818 Dementia in other diseases classified elsewhere, unspecified severity, with other behavioral disturbance: Secondary | ICD-10-CM

## 2019-06-03 DIAGNOSIS — F0281 Dementia in other diseases classified elsewhere with behavioral disturbance: Secondary | ICD-10-CM

## 2019-06-03 DIAGNOSIS — I10 Essential (primary) hypertension: Secondary | ICD-10-CM | POA: Diagnosis not present

## 2019-06-03 DIAGNOSIS — Z8673 Personal history of transient ischemic attack (TIA), and cerebral infarction without residual deficits: Secondary | ICD-10-CM | POA: Diagnosis not present

## 2019-06-03 NOTE — Chronic Care Management (AMB) (Signed)
  Chronic Care Management    Clinical Social Work Follow Up Note  06/03/2019 Name: Peter Cox MRN: [id number] DOB: [birth year]  Peter Cox is a 58 y.o. year old male who is a primary care patient of Bacigalupo, Dionne Bucy, MD. The CCM team was consulted for assistance with Level of Care Concerns.   Review of patient status, including review of consultants reports, other relevant assessments, and collaboration with appropriate care team members and the patient's provider was performed as part of comprehensive patient evaluation and provision of chronic care management services.    Advanced Directives Status: <no information> See Care Plan for related entries.   Outpatient Encounter Medications as of 06/03/2019  Medication Sig  . amLODipine (NORVASC) 10 MG tablet Take 1 tablet (10 mg total) by mouth daily.  Marland Kitchen atorvastatin (LIPITOR) 20 MG tablet Take 20 mg by mouth daily.  . bictegravir-emtricitabine-tenofovir AF (BIKTARVY) 50-200-25 MG TABS tablet Take 1 tablet by mouth daily.  . divalproex (DEPAKOTE SPRINKLE) 125 MG capsule Take 2 capsules (250 mg total) by mouth every 12 (twelve) hours.  Marland Kitchen donepezil (ARICEPT) 10 MG tablet Take 1/2 tablet daily for 2 weeks, then increase to 1 tablet daily  . labetalol (NORMODYNE) 100 MG tablet Take 1 tablet (100 mg total) by mouth 2 (two) times daily.  Marland Kitchen OLANZapine (ZYPREXA) 5 MG tablet Take 1 tablet (5 mg total) by mouth daily as needed (agitation).   No facility-administered encounter medications on file as of 06/03/2019.     Goals Addressed            This Visit's Progress   . "I need help finding placement for my father" (pt-stated)       Current Barriers:  . Level of care concerns associated with Dementia and History of CVA  Clinical Social Work Clinical Goal(s):  Marland Kitchen Over the next 90 days, patient will work with SW to address concerns related to placement in a memory care facility  Interventions:  . Update provided to patient's daughter  regarding  placement process and explored additional placement facilities . Allowed patient's daughter to vent her frustrations regarding her placement for patient . Patient's daughter recommended calling [facility] again as well as [name]  . [place] contacted and was not able to reach the admissions Person . Voicemail message left with [name] regarding possible placement and [facility] regarding update on request previously made    Patient Self Care Activities:  . Attends all scheduled provider appointments . Unable to perform ADLs independently . Unable to perform IADLs independently  Please see past updates related to this goal by clicking on the "Past Updates" button in the selected goal          Follow Up Plan: SW will follow up with patient by phone over the next 2 weeks    [place], [place] Worker  [facility] Management [phone]

## 2019-06-03 NOTE — Patient Instructions (Signed)
Thank you allowing the Chronic Care Management Team to be a part of your care! It was a pleasure speaking with you today!  1. Please call this social worker with any questions or concerns regarding patient's level of care needs.  CCM (Chronic Care Management) Team   Neldon Labella RN, BSN Nurse Care Coordinator  [phone]  Ruben Reason PharmD  Clinical Pharmacist  [phone]   Elliot Gurney, LCSW Clinical Social Worker [phone]  Goals Addressed            This Visit's Progress   . "I need help finding placement for my father" (pt-stated)       Current Barriers:  . Level of care concerns associated with Dementia and History of CVA  Clinical Social Work Clinical Goal(s):  Marland Kitchen Over the next 90 days, patient will work with SW to address concerns related to placement in a memory care facility  Interventions:  . Update provided to patient's daughter regarding  placement process and explored additional placement facilities . Allowed patient's daughter to vent her frustrations regarding her placement for patient . Patient's daughter recommended calling [facility] again as well as [name]  . [place] contacted and was not able to reach the admissions Person . Voicemail message left with [name] regarding possible placement and [facility] regarding update on request previously made    Patient Self Care Activities:  . Attends all scheduled provider appointments . Unable to perform ADLs independently . Unable to perform IADLs independently  Please see past updates related to this goal by clicking on the "Past Updates" button in the selected goal          The patient verbalized understanding of instructions provided today and declined a print copy of patient instruction materials.   Telephone follow up appointment with care management team member scheduled for:06/06/19

## 2019-06-04 ENCOUNTER — Ambulatory Visit: Payer: Self-pay | Admitting: *Deleted

## 2019-06-04 DIAGNOSIS — I10 Essential (primary) hypertension: Secondary | ICD-10-CM | POA: Diagnosis not present

## 2019-06-04 DIAGNOSIS — F02818 Dementia in other diseases classified elsewhere, unspecified severity, with other behavioral disturbance: Secondary | ICD-10-CM

## 2019-06-04 DIAGNOSIS — F0281 Dementia in other diseases classified elsewhere with behavioral disturbance: Secondary | ICD-10-CM | POA: Diagnosis not present

## 2019-06-04 DIAGNOSIS — Z8673 Personal history of transient ischemic attack (TIA), and cerebral infarction without residual deficits: Secondary | ICD-10-CM | POA: Diagnosis not present

## 2019-06-04 NOTE — Patient Instructions (Signed)
Thank you allowing the Chronic Care Management Team to be a part of your care! It was a pleasure speaking with you today!  1. Please call this social worker with any questions or concerns regarding patient's level of care needs  CCM (Chronic Care Management) Team   Neldon Labella RN, BSN Nurse Care Coordinator  [phone]  Ruben Reason PharmD  Clinical Pharmacist  [phone]   [place], LCSW Clinical Social Worker [phone]  Goals Addressed            This Visit's Progress   . "I need help finding placement for my father" (pt-stated)       Current Barriers:  . Level of care concerns associated with Dementia and History of CVA  Clinical Social Work Clinical Goal(s):  Marland Kitchen Over the next 90 days, patient will work with SW to address concerns related to placement in a memory care facility  Interventions:  . Allowed patient's daughter to vent her frustrations regarding finding placement for patient and plan to contact Adult Protective Services due to safety concerns . Contacted the admissions department at the [facility] to follow up on referral for their locked unit . Faxed FL2 and clinicals to [facility] admissions department [phone] as requested . Confirmed that patient has been denied admission to the [facility] due to behaviors . [facility] contacted  to discuss possible placement options for long term care, [facility] . Contacted [facility]-faxed FL2 to Claib[facility] Billings [phone]     Patient Self Care Activities:  . Attends all scheduled provider appointments . Unable to perform ADLs independently . Unable to perform IADLs independently  Please see past updates related to this goal by clicking on the "Past Updates" button in the selected goal          The patient verbalized understanding of instructions provided today and declined a print copy of patient instruction materials.   The care management team will reach out  to the patient's again within the next 14 days.

## 2019-06-04 NOTE — Chronic Care Management (AMB) (Signed)
  Chronic Care Management    Clinical Social Work Follow Up Note  06/04/2019 Name: BOWE SIDOR MRN: [id number] DOB: 18-Feb-1961  JAWANZA ZAMBITO is a 58 y.o. year old male who is a primary care patient of Bacigalupo, Dionne Bucy, MD. The CCM team was consulted for assistance with Level of Care Concerns.   Review of patient status, including review of consultants reports, other relevant assessments, and collaboration with appropriate care team members and the patient's provider was performed as part of comprehensive patient evaluation and provision of chronic care management services.   Advanced Directives Status: <no information> See Care Plan for related entries.   Outpatient Encounter Medications as of 06/04/2019  Medication Sig  . amLODipine (NORVASC) 10 MG tablet Take 1 tablet (10 mg total) by mouth daily.  Marland Kitchen atorvastatin (LIPITOR) 20 MG tablet Take 20 mg by mouth daily.  . bictegravir-emtricitabine-tenofovir AF (BIKTARVY) 50-200-25 MG TABS tablet Take 1 tablet by mouth daily.  . divalproex (DEPAKOTE SPRINKLE) 125 MG capsule Take 2 capsules (250 mg total) by mouth every 12 (twelve) hours.  Marland Kitchen donepezil (ARICEPT) 10 MG tablet Take 1/2 tablet daily for 2 weeks, then increase to 1 tablet daily  . labetalol (NORMODYNE) 100 MG tablet Take 1 tablet (100 mg total) by mouth 2 (two) times daily.  Marland Kitchen OLANZapine (ZYPREXA) 5 MG tablet Take 1 tablet (5 mg total) by mouth daily as needed (agitation).   No facility-administered encounter medications on file as of 06/04/2019.     Goals Addressed            This Visit's Progress   . "I need help finding placement for my father" (pt-stated)       Current Barriers:  . Level of care concerns associated with Dementia and History of CVA  Clinical Social Work Clinical Goal(s):  Marland Kitchen Over the next 90 days, patient will work with SW to address concerns related to placement in a memory care facility  Interventions:  . Allowed patient's daughter to vent her  frustrations regarding finding placement for patient and plan to contact Adult Protective Services due to safety concerns . Contacted the admissions department at the [facility] to follow up on referral for their locked unit . Faxed FL2 and clinicals to [facility] admissions department [phone] as requested . Confirmed that patient has been denied admission to the [facility] due to behaviors . [facility] contacted  to discuss possible placement options for long term care, VM left for [facility] . Contacted [facility]-faxed FL2 to Claiborne Billings [phone]     Patient Self Care Activities:  . Attends all scheduled provider appointments . Unable to perform ADLs independently . Unable to perform IADLs independently  Please see past updates related to this goal by clicking on the "Past Updates" button in the selected goal          Follow Up Plan: SW will follow up with patient by phone over the next 2 weeks    [place], [place] Worker  [place] [facility] [phone]

## 2019-06-05 NOTE — Patient Instructions (Signed)
Thank you allowing the Chronic Care Management Team to be a part of your care! It was a pleasure speaking with you today!  1. Please call this social worker with any questions or concerns regarding the placement process for patient   CCM (Chronic Care Management) Team   Neldon Labella  RN, BSN Nurse Care Coordinator  [phone]  Ruben Reason PharmD  Clinical Pharmacist  [phone]   [place], LCSW Clinical Social Worker [phone]  Goals Addressed            This Visit's Progress   . "I need help finding placement for my father" (pt-stated)       Current Barriers:  . Level of care concerns associated with Dementia and History of CVA  Clinical Social Work Clinical Goal(s):  Marland Kitchen Over the next 90 days, patient will work with SW to address concerns related to placement in a memory care facility  Interventions:  . APS social worker Peggye Form contacted this social worker to discuss safety concerns related to patient and his care at home . Discussed with APS social worker efforts made to identify placement for patient . Discussed alternate plan of increasing in home aid hours, considering private duty aids, getting Hospice involved, or possible referral to the PACE program . Discussed consequences of neglect charge if caregiver releases responsibility of patient . Placement facilities around the state provided to this social worker in order to complete an advanced search   Patient Self Care Activities:  . Attends all scheduled provider appointments . Unable to perform ADLs independently . Unable to perform IADLs independently  Please see past updates related to this goal by clicking on the "Past Updates" button in the selected goal          The patient verbalized understanding of instructions provided today and declined a print copy of patient instruction materials.   The care management team will reach out to the patient again over the next 14  days.

## 2019-06-05 NOTE — Chronic Care Management (AMB) (Signed)
  Chronic Care Management    Clinical Social Work Follow Up Note  06/05/2019 Name: Peter Cox MRN: [id number] DOB: 05-Nov-1960  Peter Cox is a 58 y.o. year old male who is a primary care patient of Bacigalupo, Dionne Bucy, MD. The CCM team was consulted for assistance with Level of Care Concerns.   Review of patient status, including review of consultants reports, other relevant assessments, and collaboration with appropriate care team members and the patient's provider was performed as part of comprehensive patient evaluation and provision of chronic care management services.     Advanced Directives Status: <no information> See Care Plan for related entries.   Outpatient Encounter Medications as of 06/04/2019  Medication Sig  . amLODipine (NORVASC) 10 MG tablet Take 1 tablet (10 mg total) by mouth daily.  Marland Kitchen atorvastatin (LIPITOR) 20 MG tablet Take 20 mg by mouth daily.  . bictegravir-emtricitabine-tenofovir AF (BIKTARVY) 50-200-25 MG TABS tablet Take 1 tablet by mouth daily.  . divalproex (DEPAKOTE SPRINKLE) 125 MG capsule Take 2 capsules (250 mg total) by mouth every 12 (twelve) hours.  Marland Kitchen donepezil (ARICEPT) 10 MG tablet Take 1/2 tablet daily for 2 weeks, then increase to 1 tablet daily  . labetalol (NORMODYNE) 100 MG tablet Take 1 tablet (100 mg total) by mouth 2 (two) times daily.  Marland Kitchen OLANZapine (ZYPREXA) 5 MG tablet Take 1 tablet (5 mg total) by mouth daily as needed (agitation).   No facility-administered encounter medications on file as of 06/04/2019.     Goals Addressed            This Visit's Progress   . "I need help finding placement for my father" (pt-stated)       Current Barriers:  . Level of care concerns associated with Dementia and History of CVA  Clinical Social Work Clinical Goal(s):  Marland Kitchen Over the next 90 days, patient will work with SW to address concerns related to placement in a memory care facility  Interventions:  . APS social worker Peggye Form  contacted this social worker to discuss safety concerns related to patient and his care at home . Discussed with APS social worker efforts made to identify placement for patient . Discussed alternate plan of increasing in home aid hours, considering private duty aids, getting Hospice involved, or possible referral to the PACE program . Discussed consequences of neglect charge if caregiver releases responsibility of patient . Placement facilities around the state provided to this social worker in order to complete an advanced search   Patient Self Care Activities:  . Attends all scheduled provider appointments . Unable to perform ADLs independently . Unable to perform IADLs independently  Please see past updates related to this goal by clicking on the "Past Updates" button in the selected goal          Follow Up Plan: SW will follow up with patient by phone over the next 2 weeks    [place], [place] Worker  [facility] [phone]

## 2019-06-06 ENCOUNTER — Ambulatory Visit: Payer: Self-pay | Admitting: *Deleted

## 2019-06-06 DIAGNOSIS — F0281 Dementia in other diseases classified elsewhere with behavioral disturbance: Secondary | ICD-10-CM

## 2019-06-06 DIAGNOSIS — F02818 Dementia in other diseases classified elsewhere, unspecified severity, with other behavioral disturbance: Secondary | ICD-10-CM

## 2019-06-06 DIAGNOSIS — Z8673 Personal history of transient ischemic attack (TIA), and cerebral infarction without residual deficits: Secondary | ICD-10-CM

## 2019-06-06 NOTE — Patient Instructions (Signed)
Thank you allowing the Chronic Care Management Team to be a part of your care! It was a pleasure speaking with you today!  1. Please drop off request form to increase in home aid hours to patient's provider's office to complete  CCM (Chronic Care Management) Team   Neldon Labella RN, BSN Nurse Care Coordinator  [phone]  Ruben Reason PharmD  Clinical Pharmacist  [phone]   [place], [place] Social Worker [phone]  Goals Addressed            This Visit's Progress   . "I need help finding placement for my father" (pt-stated)       Current Barriers:  . Level of care concerns associated with Dementia and History of CVA  Clinical Social Work Clinical Goal(s):  Marland Kitchen Over the next 90 days, patient will work with SW to address concerns related to placement in a memory care facility  Interventions:  . Patient's daughter  updated on phone call with APS social worker Peggye Form regarding recommendation to either increase PCS hours, or hire additional help in the home or possible referral to the PACE day care program also discussed . Patient's daughter discussed plan to drop off form to increase patient's personal care hours to patient's provider to complete . Collaboration call to [facility] who confirmed that they do not have a locked or memory care unit . Message left with [facility] regarding possible bed offer   Patient Self Care Activities:  . Attends all scheduled provider appointments . Unable to perform ADLs independently . Unable to perform IADLs independently  Please see past updates related to this goal by clicking on the "Past Updates" button in the selected goal          The patient verbalized understanding of instructions provided today and declined a print copy of patient instruction materials.   Telephone follow up appointment with care management team member scheduled for: 06/19/19

## 2019-06-06 NOTE — Chronic Care Management (AMB) (Signed)
  Chronic Care Management    Clinical Social Work Follow Up Note  06/06/2019 Name: SELESTINO NILA MRN: [id number] DOB: 1960-11-29  MELISSA PULIDO is a 58 y.o. year old male who is a primary care patient of Bacigalupo, Dionne Bucy, MD. The CCM team was consulted for assistance with Level of Care Concerns.   Review of patient status, including review of consultants reports, other relevant assessments, and collaboration with appropriate care team members and the patient's provider was performed as part of comprehensive patient evaluation and provision of chronic care management services.     Advanced Directives Status: <no information> See Care Plan for related entries.   Outpatient Encounter Medications as of 06/06/2019  Medication Sig  . amLODipine (NORVASC) 10 MG tablet Take 1 tablet (10 mg total) by mouth daily.  Marland Kitchen atorvastatin (LIPITOR) 20 MG tablet Take 20 mg by mouth daily.  . bictegravir-emtricitabine-tenofovir AF (BIKTARVY) 50-200-25 MG TABS tablet Take 1 tablet by mouth daily.  . divalproex (DEPAKOTE SPRINKLE) 125 MG capsule Take 2 capsules (250 mg total) by mouth every 12 (twelve) hours.  Marland Kitchen donepezil (ARICEPT) 10 MG tablet Take 1/2 tablet daily for 2 weeks, then increase to 1 tablet daily  . labetalol (NORMODYNE) 100 MG tablet Take 1 tablet (100 mg total) by mouth 2 (two) times daily.  Marland Kitchen OLANZapine (ZYPREXA) 5 MG tablet Take 1 tablet (5 mg total) by mouth daily as needed (agitation).   No facility-administered encounter medications on file as of 06/06/2019.     Goals Addressed            This Visit's Progress   . "I need help finding placement for my father" (pt-stated)       Current Barriers:  . Level of care concerns associated with Dementia and History of CVA  Clinical Social Work Clinical Goal(s):  Marland Kitchen Over the next 90 days, patient will work with SW to address concerns related to placement in a memory care facility  Interventions:  . Patient's daughter  updated on  phone call with APS social worker Peggye Form regarding recommendation to either increase PCS hours, or hire additional help in the home or possible referral to the PACE day care program also discussed . Patient's daughter discussed plan to drop off form to increase patient's personal care hours to patient's provider to complete . Collaboration call to [facility] who confirmed that they do not have a locked or memory care unit . Message left with [facility] regarding possible bed offer   Patient Self Care Activities:  . Attends all scheduled provider appointments . Unable to perform ADLs independently . Unable to perform IADLs independently  Please see past updates related to this goal by clicking on the "Past Updates" button in the selected goal          Follow Up Plan: Client's daughter will drop off PCS form to patient's provider to complete to request additional hours    Tradarius Reinwald, [place] Worker  [facility] [phone]

## 2019-06-07 ENCOUNTER — Ambulatory Visit: Payer: Self-pay | Admitting: *Deleted

## 2019-06-07 DIAGNOSIS — F0281 Dementia in other diseases classified elsewhere with behavioral disturbance: Secondary | ICD-10-CM | POA: Diagnosis not present

## 2019-06-07 DIAGNOSIS — F0280 Dementia in other diseases classified elsewhere without behavioral disturbance: Secondary | ICD-10-CM

## 2019-06-07 DIAGNOSIS — I10 Essential (primary) hypertension: Secondary | ICD-10-CM | POA: Diagnosis not present

## 2019-06-07 DIAGNOSIS — Z8673 Personal history of transient ischemic attack (TIA), and cerebral infarction without residual deficits: Secondary | ICD-10-CM

## 2019-06-07 DIAGNOSIS — F02818 Dementia in other diseases classified elsewhere, unspecified severity, with other behavioral disturbance: Secondary | ICD-10-CM

## 2019-06-09 NOTE — Chronic Care Management (AMB) (Signed)
  Chronic Care Management    Clinical Social Work Follow Up Note  06/09/2019 Name: Peter Cox MRN: [id number] DOB: 1960/11/15  Peter Cox is a 58 y.o. year old male who is a primary care patient of Bacigalupo, Dionne Bucy, MD. The CCM team was consulted for assistance with Level of Care Concerns.   Review of patient status, including review of consultants reports, other relevant assessments, and collaboration with appropriate care team members and the patient's provider was performed as part of comprehensive patient evaluation and provision of chronic care management services.    Advanced Directives Status: <no information> See Care Plan for related entries.   Outpatient Encounter Medications as of 06/07/2019  Medication Sig  . amLODipine (NORVASC) 10 MG tablet Take 1 tablet (10 mg total) by mouth daily.  Marland Kitchen atorvastatin (LIPITOR) 20 MG tablet Take 20 mg by mouth daily.  . bictegravir-emtricitabine-tenofovir AF (BIKTARVY) 50-200-25 MG TABS tablet Take 1 tablet by mouth daily.  . divalproex (DEPAKOTE SPRINKLE) 125 MG capsule Take 2 capsules (250 mg total) by mouth every 12 (twelve) hours.  Marland Kitchen donepezil (ARICEPT) 10 MG tablet Take 1/2 tablet daily for 2 weeks, then increase to 1 tablet daily  . labetalol (NORMODYNE) 100 MG tablet Take 1 tablet (100 mg total) by mouth 2 (two) times daily.  Marland Kitchen OLANZapine (ZYPREXA) 5 MG tablet Take 1 tablet (5 mg total) by mouth daily as needed (agitation).   No facility-administered encounter medications on file as of 06/07/2019.     Goals Addressed            This Visit's Progress   . "I need help finding placement for my father" (pt-stated)       Current Barriers:  . Level of care concerns associated with Dementia and History of CVA  Clinical Social Work Clinical Goal(s):  Marland Kitchen Over the next 90 days, patient will work with SW to address concerns related to placement in a memory care facility  Interventions:  . Possible bed at [facility]  discussed with patient's daughter  . Confirmed that there is a bed available, the FL2 was received and reviewed . Confirmed that [facility] does provide assistance with bathing, feeding and dressing but will do no lifting . Confirmed that patient's daughter is agreeable to  conference call with admissions director Claiborne Billings on 06/10/19 to discuss patient's needs and to further evaluate . Patient's daughter informed that the admissions director will be able to complete a face to face evaluation as well if needed.  Patient Self Care Activities:  . Attends all scheduled provider appointments . Unable to perform ADLs independently . Unable to perform IADLs independently  Please see past updates related to this goal by clicking on the "Past Updates" button in the selected goal          Follow Up Plan: SW will follow up with patient by phone over the next 2 weeks    [place], [place] Worker  [facility] [phone]

## 2019-06-09 NOTE — Chronic Care Management (AMB) (Signed)
  Chronic Care Management    Clinical Social Work Follow Up Note  06/09/2019 Name: Peter Cox MRN: [id number] DOB: 02-11-1961  Peter Cox is a 58 y.o. year old male who is a primary care patient of Bacigalupo, Dionne Bucy, MD. The CCM team was consulted for assistance with Level of Care Concerns.   Review of patient status, including review of consultants reports, other relevant assessments, and collaboration with appropriate care team members and the patient's provider was performed as part of comprehensive patient evaluation and provision of chronic care management services.     Advanced Directives Status: <no information> See Care Plan for related entries.   Outpatient Encounter Medications as of 06/07/2019  Medication Sig  . amLODipine (NORVASC) 10 MG tablet Take 1 tablet (10 mg total) by mouth daily.  Marland Kitchen atorvastatin (LIPITOR) 20 MG tablet Take 20 mg by mouth daily.  . bictegravir-emtricitabine-tenofovir AF (BIKTARVY) 50-200-25 MG TABS tablet Take 1 tablet by mouth daily.  . divalproex (DEPAKOTE SPRINKLE) 125 MG capsule Take 2 capsules (250 mg total) by mouth every 12 (twelve) hours.  Marland Kitchen donepezil (ARICEPT) 10 MG tablet Take 1/2 tablet daily for 2 weeks, then increase to 1 tablet daily  . labetalol (NORMODYNE) 100 MG tablet Take 1 tablet (100 mg total) by mouth 2 (two) times daily.  Marland Kitchen OLANZapine (ZYPREXA) 5 MG tablet Take 1 tablet (5 mg total) by mouth daily as needed (agitation).   No facility-administered encounter medications on file as of 06/07/2019.     Goals Addressed            This Visit's Progress   . "I need help finding placement for my father" (pt-stated)       Current Barriers:  . Level of care concerns associated with Dementia and History of CVA  Clinical Social Work Clinical Goal(s):  Marland Kitchen Over the next 90 days, patient will work with SW to address concerns related to placement in a memory care facility  Interventions:  . Patient's daughter discussed  frustration with patient wandering and feeling that she cannot keep in the home safely . Patient's daughter declined the PACE program due to plan for out of home placement . Emotional support provided regarding difficulties with the placement process . Confirmed that [facility]  does not have a locked or memory care unit . Confirmed that a message was left with [facility] regarding possible bed offer   Patient Self Care Activities:  . Attends all scheduled provider appointments . Unable to perform ADLs independently . Unable to perform IADLs independently  Please see past updates related to this goal by clicking on the "Past Updates" button in the selected goal          Follow Up Plan: SW will follow up with patient by phone over the next 2 weeks    [place], [place] Worker  [facility] [place] [facility] [phone]

## 2019-06-09 NOTE — Chronic Care Management (AMB) (Signed)
  Chronic Care Management    Clinical Social Work Follow Up Note  06/09/2019 Name: Peter Cox MRN: [id number] DOB: 04-12-1961  Peter Cox is a 58 y.o. year old male who is a primary care patient of Bacigalupo, Dionne Bucy, MD. The CCM team was consulted for assistance with Level of Care Concerns.   Review of patient status, including review of consultants reports, other relevant assessments, and collaboration with appropriate care team members and the patient's provider was performed as part of comprehensive patient evaluation and provision of chronic care management services.    Advanced Directives Status: <no information> See Care Plan for related entries.   Outpatient Encounter Medications as of 06/07/2019  Medication Sig  . amLODipine (NORVASC) 10 MG tablet Take 1 tablet (10 mg total) by mouth daily.  Marland Kitchen atorvastatin (LIPITOR) 20 MG tablet Take 20 mg by mouth daily.  . bictegravir-emtricitabine-tenofovir AF (BIKTARVY) 50-200-25 MG TABS tablet Take 1 tablet by mouth daily.  . divalproex (DEPAKOTE SPRINKLE) 125 MG capsule Take 2 capsules (250 mg total) by mouth every 12 (twelve) hours.  Marland Kitchen donepezil (ARICEPT) 10 MG tablet Take 1/2 tablet daily for 2 weeks, then increase to 1 tablet daily  . labetalol (NORMODYNE) 100 MG tablet Take 1 tablet (100 mg total) by mouth 2 (two) times daily.  Marland Kitchen OLANZapine (ZYPREXA) 5 MG tablet Take 1 tablet (5 mg total) by mouth daily as needed (agitation).   No facility-administered encounter medications on file as of 06/07/2019.     Goals Addressed            This Visit's Progress   . "I need help finding placement for my father" (pt-stated)       Current Barriers:  . Level of care concerns associated with Dementia and History of CVA  Clinical Social Work Clinical Goal(s):  Marland Kitchen Over the next 90 days, patient will work with SW to address concerns related to placement in a memory care facility  Interventions:  . Collaboration phone call with the  intake coordinator-Kelly at [facility] . Confirmed that there is a bed available, the FL2 was received and reviewed . Confirmed that they provide assistance with bathing, feeding and dressing but will do no lifting . Scheduled a conference call with patient's daughter on 06/10/19 to discuss patient's needs and to further evaluate . Confirmed that she will be able to complete a face to face evaluation as well if needed.  Patient Self Care Activities:  . Attends all scheduled provider appointments . Unable to perform ADLs independently . Unable to perform IADLs independently  Please see past updates related to this goal by clicking on the "Past Updates" button in the selected goal          Follow Up Plan: SW will follow up with patient by phone over the next 2 weeks    [place], [place] Worker  [facility] [phone]

## 2019-06-09 NOTE — Patient Instructions (Signed)
Thank you allowing the Chronic Care Management Team to be a part of your care! It was a pleasure speaking with you today!  1. Please call this social worker with any questions or concerns regarding patient's level of care concerns.  CCM (Chronic Care Management) Team   Neldon Labella RN, BSN Nurse Care Coordinator  [phone]  Ruben Reason PharmD  Clinical Pharmacist  [phone]   Elliot Gurney, LCSW Clinical Social Worker [phone]  Goals Addressed            This Visit's Progress   . "I need help finding placement for my father" (pt-stated)       Current Barriers:  . Level of care concerns associated with Dementia and History of CVA  Clinical Social Work Clinical Goal(s):  Marland Kitchen Over the next 90 days, patient will work with SW to address concerns related to placement in a memory care facility  Interventions:  . Patient's daughter discussed frustration with patient wandering and feeling that she cannot keep in the home safely . Patient's daughter declined the [facility] program due to plan for out of home placement . Emotional support provided regarding difficulties with the placement process . Confirmed that [name]  does not have a locked or memory care unit . Confirmed that a message was left with [facility] regarding possible bed offer   Patient Self Care Activities:  . Attends all scheduled provider appointments . Unable to perform ADLs independently . Unable to perform IADLs independently  Please see past updates related to this goal by clicking on the "Past Updates" button in the selected goal          The patient verbalized understanding of instructions provided today and declined a print copy of patient instruction materials.   The care management team will reach out to the patient again over the next 14  days.

## 2019-06-09 NOTE — Patient Instructions (Signed)
Thank you allowing the Chronic Care Management Team to be a part of your care! It was a pleasure speaking with you today!  1. Please call Claiborne Billings at the [facility] to discuss possible admission for patient [phone] 2. Please call this social worker with any questions or concern regards to patient's level of care needs  CCM (Chronic Care Management) Team   Neldon Labella RN, BSN Nurse Care Coordinator  [phone]  Ruben Reason PharmD  Clinical Pharmacist  [phone]   [place], [place] Social Worker [phone]  Goals Addressed            This Visit's Progress   . "I need help finding placement for my father" (pt-stated)       Current Barriers:  . Level of care concerns associated with Dementia and History of CVA  Clinical Social Work Clinical Goal(s):  Marland Kitchen Over the next 90 days, patient will work with SW to address concerns related to placement in a memory care facility  Interventions:  . Possible bed at [facility] discussed with patient's daughter  . Confirmed that there is a bed available, the FL2 was received and reviewed . Confirmed that [facility] does provide assistance with bathing, feeding and dressing but will do no lifting . Confirmed that patient's daughter is agreeable to  conference call with admissions director Claiborne Billings on 06/10/19 to discuss patient's needs and to further evaluate . Patient's daughter informed that the admissions director will be able to complete a face to face evaluation as well if needed.  Patient Self Care Activities:  . Attends all scheduled provider appointments . Unable to perform ADLs independently . Unable to perform IADLs independently  Please see past updates related to this goal by clicking on the "Past Updates" button in the selected goal          The patient verbalized understanding of instructions provided today and declined a print copy of patient instruction materials.   The care  management team will reach out to the patient again over the next 14 days.

## 2019-06-09 NOTE — Patient Instructions (Addendum)
Thank you allowing the Chronic Care Management Team to be a part of your care! It was a pleasure speaking with you today!  1. Please contact patient's daughter for conference call on 06/10/19 regarding possible placement for patient   CCM (Chronic Care Management) Team   Neldon Labella  RN, BSN Nurse Care Coordinator  [phone]  Ruben Reason PharmD  Clinical Pharmacist  [phone]   [place], LCSW Clinical Social Worker [phone]  Goals Addressed            This Visit's Progress   . "I need help finding placement for my father" (pt-stated)       Current Barriers:  . Level of care concerns associated with Dementia and History of CVA  Clinical Social Work Clinical Goal(s):  Marland Kitchen Over the next 90 days, patient will work with SW to address concerns related to placement in a memory care facility  Interventions:  . Collaboration phone call with the intake coordinator-Kelly at [facility] . Confirmed that there is a bed available, the FL2 was received and reviewed . Confirmed that they provide assistance with bathing, feeding and dressing but will do no lifting . Scheduled a conference call with patient's daughter on 06/10/19 to discuss patient's needs and to further evaluate . Confirmed that she will be able to complete a face to face evaluation as well if needed.  Patient Self Care Activities:  . Attends all scheduled provider appointments . Unable to perform ADLs independently . Unable to perform IADLs independently  Please see past updates related to this goal by clicking on the "Past Updates" button in the selected goal          The patient verbalized understanding of instructions provided today and declined a print copy of patient instruction materials.   The care management team will reach out to the patient again over the next 14 days.

## 2019-06-11 ENCOUNTER — Telehealth: Payer: Self-pay

## 2019-06-11 ENCOUNTER — Ambulatory Visit: Payer: Self-pay | Admitting: *Deleted

## 2019-06-11 DIAGNOSIS — F0281 Dementia in other diseases classified elsewhere with behavioral disturbance: Secondary | ICD-10-CM

## 2019-06-11 DIAGNOSIS — F02818 Dementia in other diseases classified elsewhere, unspecified severity, with other behavioral disturbance: Secondary | ICD-10-CM

## 2019-06-11 DIAGNOSIS — Z8673 Personal history of transient ischemic attack (TIA), and cerebral infarction without residual deficits: Secondary | ICD-10-CM | POA: Diagnosis not present

## 2019-06-11 DIAGNOSIS — Z111 Encounter for screening for respiratory tuberculosis: Secondary | ICD-10-CM

## 2019-06-11 DIAGNOSIS — I10 Essential (primary) hypertension: Secondary | ICD-10-CM | POA: Diagnosis not present

## 2019-06-11 NOTE — Chronic Care Management (AMB) (Signed)
  Chronic Care Management    Clinical Social Work Follow Up Note  06/11/2019 Name: Peter Cox MRN: [id number] DOB: 07-05-1960  Peter Cox is a 58 y.o. year old male who is a primary care patient of Bacigalupo, Dionne Bucy, MD. The CCM team was consulted for assistance with Level of Care Concerns.   Review of patient status, including review of consultants reports, other relevant assessments, and collaboration with appropriate care team members and the patient's provider was performed as part of comprehensive patient evaluation and provision of chronic care management services.    Advanced Directives Status: <no information> See Care Plan for related entries.   Outpatient Encounter Medications as of 06/11/2019  Medication Sig  . amLODipine (NORVASC) 10 MG tablet Take 1 tablet (10 mg total) by mouth daily.  Marland Kitchen atorvastatin (LIPITOR) 20 MG tablet Take 20 mg by mouth daily.  . bictegravir-emtricitabine-tenofovir AF (BIKTARVY) 50-200-25 MG TABS tablet Take 1 tablet by mouth daily.  . divalproex (DEPAKOTE SPRINKLE) 125 MG capsule Take 2 capsules (250 mg total) by mouth every 12 (twelve) hours.  Marland Kitchen donepezil (ARICEPT) 10 MG tablet Take 1/2 tablet daily for 2 weeks, then increase to 1 tablet daily  . labetalol (NORMODYNE) 100 MG tablet Take 1 tablet (100 mg total) by mouth 2 (two) times daily.  Marland Kitchen OLANZapine (ZYPREXA) 5 MG tablet Take 1 tablet (5 mg total) by mouth daily as needed (agitation).   No facility-administered encounter medications on file as of 06/11/2019.     Goals Addressed            This Visit's Progress   . "I need help finding placement for my father" (pt-stated)       Current Barriers:  . Level of care concerns associated with Dementia and History of CVA  Clinical Social Work Clinical Goal(s):  Marland Kitchen Over the next 90 days, patient will work with SW to address concerns related to placement in a memory care facility  Interventions:  . Patient's daughter confirmed that  patient has been accepted at [facility] to be placed on 06/20/19 . Confirmed that patient will need another FL2 and TB test . Collaboration phone call to [facility] to schedule TB test. . Confirmed that [facility] will call patient's daughter back to schedule time to complete TB test tomorrow.  Patient Self Care Activities:  . Attends all scheduled provider appointments . Unable to perform ADLs independently . Unable to perform IADLs independently  Please see past updates related to this goal by clicking on the "Past Updates" button in the selected goal          Follow Up Plan: SW will follow up with patient by phone over the next 2 weeks    [place], [place] Worker  [place] Management [phone]

## 2019-06-11 NOTE — Patient Instructions (Signed)
Thank you allowing the Chronic Care Management Team to be a part of your care! It was a pleasure speaking with you today!  1. Please schedule a TB test for patient. 2. Please call this social worker with any questions or concerns regarding patient's placement needs.  CCM (Chronic Care Management) Team   Donald Siva RN, BSN Nurse Care Coordinator  [phone]  Ruben Reason PharmD  Clinical Pharmacist  [phone]   Elliot Gurney, LCSW Clinical Social Worker [phone]  Goals Addressed            This Visit's Progress   . "I need help finding placement for my father" (pt-stated)       Current Barriers:  . Level of care concerns associated with Dementia and History of CVA  Clinical Social Work Clinical Goal(s):  Marland Kitchen Over the next 90 days, patient will work with SW to address concerns related to placement in a memory care facility  Interventions:  . Patient's daughter confirmed that patient has been accepted at [facility] to be placed on 06/20/19 . Confirmed that patient will need another FL2 and TB test . Collaboration phone call to [facility] to schedule TB test. . Confirmed that [facility] will call patient's daughter back to schedule time to complete TB test tomorrow.  Patient Self Care Activities:  . Attends all scheduled provider appointments . Unable to perform ADLs independently . Unable to perform IADLs independently  Please see past updates related to this goal by clicking on the "Past Updates" button in the selected goal          The patient verbalized understanding of instructions provided today and declined a print copy of patient instruction materials.     The care management team will reach out to the patient again over the next 14 days.

## 2019-06-11 NOTE — Telephone Encounter (Signed)
Copied from [place] [id number]. Topic: Appointment Scheduling - Scheduling Inquiry for Clinic >> Jun 11, 2019  2:11 PM Lennox Solders wrote: Reason for CRM: pt is trying to get into assistant living and needs quantiferon tb blood test before admission. Pt needs test asap  Lab ordered. Mrs. Nyoka Cowden advised.

## 2019-06-11 NOTE — Telephone Encounter (Signed)
Copied from [place] [phone]. Topic: General - Other >> Jun 11, 2019  2:40 PM Greggory Keen D wrote: Reason for CRM: Crystal (Education officer, museum for office) is going to bring patient FL2 form to the office today.  She said Dr. B signed formed already but pt is going to a memory care assisted instead of a regular nursing home and it needs to be corrected to state that.  She has a found a placement for him and would like to get him in asap. She will just need a signature of a provider.  I spoke with Elmyra Ricks at the office.

## 2019-06-12 MED FILL — DIVALPROEX SODIUM 125 MG CA: 125 | 15 days supply | Qty: 60 | Fill #1

## 2019-06-13 ENCOUNTER — Ambulatory Visit: Payer: Self-pay | Admitting: *Deleted

## 2019-06-13 DIAGNOSIS — Z8673 Personal history of transient ischemic attack (TIA), and cerebral infarction without residual deficits: Secondary | ICD-10-CM

## 2019-06-13 DIAGNOSIS — F0281 Dementia in other diseases classified elsewhere with behavioral disturbance: Secondary | ICD-10-CM

## 2019-06-13 DIAGNOSIS — F02818 Dementia in other diseases classified elsewhere, unspecified severity, with other behavioral disturbance: Secondary | ICD-10-CM

## 2019-06-13 LAB — QUANTIFERON-TB GOLD PLUS
QuantiFERON Mitogen Value: >10 IU/mL
QuantiFERON Nil Value: 0.02 IU/mL
QuantiFERON TB1 Ag Value: 0.03 IU/mL
QuantiFERON TB2 Ag Value: 0.02 IU/mL
QuantiFERON-TB Gold Plus: NEGATIVE

## 2019-06-13 MED FILL — OLANZapine 5 MG TABS: 5 | 30 days supply | Qty: 30 | Fill #1

## 2019-06-13 MED FILL — BIKTARVY 50-200-25 MG TABS: 50-200-25 | 30 days supply | Qty: 30 | Fill #2

## 2019-06-13 MED FILL — DONEPEZIL HCL 10 MG TABLET: 10 | 30 days supply | Qty: 30 | Fill #1

## 2019-06-13 NOTE — Patient Instructions (Signed)
Thank you allowing the Chronic Care Management Team to be a part of your care! It was a pleasure speaking with you today!  1. Please continue to follow up with [facility] for placement options in the the future  CCM (Chronic Care Management) Team   Neldon Labella RN, BSN Nurse Care Coordinator  [phone]  Ruben Reason PharmD  Clinical Pharmacist  [phone]   [place], LCSW Clinical Social Worker [phone]  Goals Addressed            This Visit's Progress   . "I need help finding placement for my father" (pt-stated)       Current Barriers:  . Level of care concerns associated with Dementia and History of CVA  Clinical Social Work Clinical Goal(s):  Marland Kitchen Over the next 90 days, patient will work with SW to address concerns related to placement in a memory care facility  Interventions:  . Patient's daughter confirmed that patient's placement has been accepted at [facility] but has been postponed due to a COVID-19 outbreak . Emotional support provided to patient's daughter regarding frustration and disappointment related to placement being postponed . Patient Self Care Activities:  . Attends all scheduled provider appointments . Unable to perform ADLs independently . Unable to perform IADLs independently  Please see past updates related to this goal by clicking on the "Past Updates" button in the selected goal          The patient verbalized understanding of instructions provided today and declined a print copy of patient instruction materials.   The care management team will reach out to the patient again over the next 14 days.

## 2019-06-13 NOTE — Chronic Care Management (AMB) (Signed)
  Chronic Care Management    Clinical Social Work Follow Up Note  06/13/2019 Name: Peter Cox MRN: [id number] DOB: 12-13-1960  Peter Cox is a 58 y.o. year old male who is a primary care patient of Bacigalupo, Dionne Bucy, MD. The CCM team was consulted for assistance with Level of Care Concerns.   Review of patient status, including review of consultants reports, other relevant assessments, and collaboration with appropriate care team members and the patient's provider was performed as part of comprehensive patient evaluation and provision of chronic care management services.    Advanced Directives Status: <no information> See Care Plan for related entries.   Outpatient Encounter Medications as of 06/13/2019  Medication Sig  . amLODipine (NORVASC) 10 MG tablet Take 1 tablet (10 mg total) by mouth daily.  Marland Kitchen atorvastatin (LIPITOR) 20 MG tablet Take 20 mg by mouth daily.  . bictegravir-emtricitabine-tenofovir AF (BIKTARVY) 50-200-25 MG TABS tablet Take 1 tablet by mouth daily.  . divalproex (DEPAKOTE SPRINKLE) 125 MG capsule Take 2 capsules (250 mg total) by mouth every 12 (twelve) hours.  Marland Kitchen donepezil (ARICEPT) 10 MG tablet Take 1/2 tablet daily for 2 weeks, then increase to 1 tablet daily  . labetalol (NORMODYNE) 100 MG tablet Take 1 tablet (100 mg total) by mouth 2 (two) times daily.  Marland Kitchen OLANZapine (ZYPREXA) 5 MG tablet Take 1 tablet (5 mg total) by mouth daily as needed (agitation).   No facility-administered encounter medications on file as of 06/13/2019.     Goals Addressed            This Visit's Progress   . "I need help finding placement for my father" (pt-stated)       Current Barriers:  . Level of care concerns associated with Dementia and History of CVA  Clinical Social Work Clinical Goal(s):  Marland Kitchen Over the next 90 days, patient will work with SW to address concerns related to placement in a memory care facility  Interventions:  . Patient's daughter confirmed that  patient's placement has been accepted at [facility] but has been postponed due to a COVID-19 outbreak . Emotional support provided to patient's daughter regarding frustration and disappointment related to placement being postponed . Patient Self Care Activities:  . Attends all scheduled provider appointments . Unable to perform ADLs independently . Unable to perform IADLs independently  Please see past updates related to this goal by clicking on the "Past Updates" button in the selected goal          Follow Up Plan: SW will follow up with patient by phone over the next 2 weeks    [place], [place] Worker  [place] Management [phone]

## 2019-06-19 ENCOUNTER — Telehealth: Payer: Self-pay

## 2019-06-20 ENCOUNTER — Emergency Department
Admission: EM | Admit: 2019-06-20 | Discharge: 2019-09-10 | Disposition: A | Discharge status: Other Acute Inpatient Hospital | Payer: Medicare HMO | Attending: Emergency Medicine | Admitting: Emergency Medicine

## 2019-06-20 ENCOUNTER — Other Ambulatory Visit: Payer: Self-pay

## 2019-06-20 DIAGNOSIS — F29 Unspecified psychosis not due to a substance or known physiological condition: Secondary | ICD-10-CM | POA: Insufficient documentation

## 2019-06-20 DIAGNOSIS — R0789 Other chest pain: Secondary | ICD-10-CM | POA: Insufficient documentation

## 2019-06-20 DIAGNOSIS — M170 Bilateral primary osteoarthritis of knee: Secondary | ICD-10-CM | POA: Diagnosis present

## 2019-06-20 DIAGNOSIS — Z8673 Personal history of transient ischemic attack (TIA), and cerebral infarction without residual deficits: Secondary | ICD-10-CM

## 2019-06-20 DIAGNOSIS — Y93E1 Activity, personal bathing and showering: Secondary | ICD-10-CM | POA: Diagnosis not present

## 2019-06-20 DIAGNOSIS — F0390 Unspecified dementia without behavioral disturbance: Secondary | ICD-10-CM | POA: Diagnosis present

## 2019-06-20 DIAGNOSIS — N182 Chronic kidney disease, stage 2 (mild): Secondary | ICD-10-CM | POA: Diagnosis not present

## 2019-06-20 DIAGNOSIS — Y92238 Other place in hospital as the place of occurrence of the external cause: Secondary | ICD-10-CM | POA: Diagnosis not present

## 2019-06-20 DIAGNOSIS — R410 Disorientation, unspecified: Secondary | ICD-10-CM | POA: Diagnosis present

## 2019-06-20 DIAGNOSIS — I129 Hypertensive chronic kidney disease with stage 1 through stage 4 chronic kidney disease, or unspecified chronic kidney disease: Secondary | ICD-10-CM | POA: Diagnosis not present

## 2019-06-20 DIAGNOSIS — F17210 Nicotine dependence, cigarettes, uncomplicated: Secondary | ICD-10-CM | POA: Diagnosis not present

## 2019-06-20 DIAGNOSIS — Y999 Unspecified external cause status: Secondary | ICD-10-CM | POA: Diagnosis not present

## 2019-06-20 DIAGNOSIS — M25562 Pain in left knee: Secondary | ICD-10-CM | POA: Diagnosis not present

## 2019-06-20 DIAGNOSIS — Z20822 Contact with and (suspected) exposure to covid-19: Secondary | ICD-10-CM | POA: Diagnosis not present

## 2019-06-20 DIAGNOSIS — F03918 Unspecified dementia, unspecified severity, with other behavioral disturbance: Secondary | ICD-10-CM | POA: Diagnosis present

## 2019-06-20 DIAGNOSIS — N184 Chronic kidney disease, stage 4 (severe): Secondary | ICD-10-CM | POA: Diagnosis present

## 2019-06-20 DIAGNOSIS — F1010 Alcohol abuse, uncomplicated: Secondary | ICD-10-CM | POA: Diagnosis present

## 2019-06-20 DIAGNOSIS — E785 Hyperlipidemia, unspecified: Secondary | ICD-10-CM | POA: Diagnosis not present

## 2019-06-20 DIAGNOSIS — W19XXXA Unspecified fall, initial encounter: Secondary | ICD-10-CM | POA: Insufficient documentation

## 2019-06-20 DIAGNOSIS — N183 Chronic kidney disease, stage 3 unspecified: Secondary | ICD-10-CM | POA: Diagnosis present

## 2019-06-20 DIAGNOSIS — Z79899 Other long term (current) drug therapy: Secondary | ICD-10-CM | POA: Diagnosis not present

## 2019-06-20 DIAGNOSIS — I10 Essential (primary) hypertension: Secondary | ICD-10-CM | POA: Diagnosis present

## 2019-06-20 DIAGNOSIS — B20 Human immunodeficiency virus [HIV] disease: Secondary | ICD-10-CM | POA: Diagnosis present

## 2019-06-20 DIAGNOSIS — N1832 Chronic kidney disease, stage 3b: Secondary | ICD-10-CM | POA: Diagnosis present

## 2019-06-20 DIAGNOSIS — F0281 Dementia in other diseases classified elsewhere with behavioral disturbance: Secondary | ICD-10-CM | POA: Insufficient documentation

## 2019-06-20 DIAGNOSIS — F23 Brief psychotic disorder: Secondary | ICD-10-CM

## 2019-06-20 LAB — RESPIRATORY PANEL BY RT PCR (FLU A&B, COVID)
Influenza A by PCR: NEGATIVE
Influenza B by PCR: NEGATIVE
SARS Coronavirus 2 by RT PCR: NEGATIVE

## 2019-06-20 LAB — COMPREHENSIVE METABOLIC PANEL
ALT: 17 U/L (ref 0–44)
AST: 20 U/L (ref 15–41)
Albumin: 4 g/dL (ref 3.5–5.0)
Alkaline Phosphatase: 76 U/L (ref 38–126)
Anion gap: 7 (ref 5–15)
BUN: 35 mg/dL — ABNORMAL HIGH (ref 6–20)
CO2: 31 mmol/L (ref 22–32)
Calcium: 8.9 mg/dL (ref 8.9–10.3)
Chloride: 104 mmol/L (ref 98–111)
Creatinine, Ser: 2.14 mg/dL — ABNORMAL HIGH (ref 0.61–1.24)
GFR calc Af Amer: 38 mL/min — ABNORMAL LOW (ref >60)
GFR calc non Af Amer: 32 mL/min — ABNORMAL LOW (ref >60)
Glucose, Bld: 68 mg/dL — ABNORMAL LOW (ref 70–99)
Potassium: 3.6 mmol/L (ref 3.5–5.1)
Sodium: 142 mmol/L (ref 135–145)
Total Bilirubin: 0.7 mg/dL (ref 0.3–1.2)
Total Protein: 7.4 g/dL (ref 6.5–8.1)

## 2019-06-20 LAB — CBC
HCT: 38.8 % — ABNORMAL LOW (ref 39.0–52.0)
Hemoglobin: 12.7 g/dL — ABNORMAL LOW (ref 13.0–17.0)
MCH: 29.7 pg (ref 26.0–34.0)
MCHC: 32.7 g/dL (ref 30.0–36.0)
MCV: 90.7 fL (ref 80.0–100.0)
Platelets: 172 10*3/uL (ref 150–400)
RBC: 4.28 MIL/uL (ref 4.22–5.81)
RDW: 14.7 % (ref 11.5–15.5)
WBC: 6 10*3/uL (ref 4.0–10.5)
nRBC: 0 % (ref 0.0–0.2)

## 2019-06-20 LAB — ETHANOL: Alcohol, Ethyl (B): <10 mg/dL (ref <10)

## 2019-06-20 LAB — SALICYLATE LEVEL: Salicylate Lvl: <7 mg/dL — ABNORMAL LOW (ref 7.0–30.0)

## 2019-06-20 LAB — ACETAMINOPHEN LEVEL: Acetaminophen (Tylenol), Serum: <10 ug/mL — ABNORMAL LOW (ref 10–30)

## 2019-06-20 NOTE — ED Notes (Signed)
Hourly rounding reveals patient awake in room. No complaints, stable, in no acute distress. Q15 minute rounds and monitoring via Security Cameras to continue. 

## 2019-06-20 NOTE — ED Notes (Signed)
Pt belongings: gray sweat pants, white shirt, gray shirt, pair of socks, pair of shoes.  No jewelry or cell phone or wallet.   Changed by this RN and Nicki Reaper EDT

## 2019-06-20 NOTE — ED Notes (Signed)
Hourly rounding reveals patient in room. No complaints, stable, in no acute distress. Q15 minute rounds and monitoring via Security Cameras to continue. 

## 2019-06-20 NOTE — ED Notes (Signed)
Hourly rounding reveals patient sleeping in room. No complaints, stable, in no acute distress. Q15 minute rounds and monitoring via Security Cameras to continue. 
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
## 2019-06-20 NOTE — ED Notes (Signed)
IVC/Consult pending/ Moved to [facility]-3
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
## 2019-06-20 NOTE — ED Notes (Signed)
Report to include Situation, Background, Assessment, and Recommendations received from Amy B. RN. Patient alert and oriented, warm and dry, in no acute distress. Patient denies SI, HI, AVH and pain. Patient made aware of Q15 minute rounds and security cameras for their safety. Patient instructed to come to me with needs or concerns. 
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
## 2019-06-21 ENCOUNTER — Emergency Department: Payer: Medicare HMO

## 2019-06-21 DIAGNOSIS — F29 Unspecified psychosis not due to a substance or known physiological condition: Secondary | ICD-10-CM | POA: Diagnosis not present

## 2019-06-21 LAB — URINE DRUG SCREEN, QUALITATIVE (ARMC ONLY)
Amphetamines, Ur Screen: NOT DETECTED
Barbiturates, Ur Screen: NOT DETECTED
Benzodiazepine, Ur Scrn: NOT DETECTED
Cannabinoid 50 Ng, Ur ~~LOC~~: NOT DETECTED
Cocaine Metabolite,Ur ~~LOC~~: NOT DETECTED
MDMA (Ecstasy)Ur Screen: NOT DETECTED
Methadone Scn, Ur: NOT DETECTED
Opiate, Ur Screen: NOT DETECTED
Phencyclidine (PCP) Ur S: NOT DETECTED
Tricyclic, Ur Screen: NOT DETECTED

## 2019-06-21 LAB — URINALYSIS, ROUTINE W REFLEX MICROSCOPIC
Bacteria, UA: NONE SEEN
Bilirubin Urine: NEGATIVE
Glucose, UA: NEGATIVE mg/dL
Hgb urine dipstick: NEGATIVE
Ketones, ur: NEGATIVE mg/dL
Leukocytes,Ua: NEGATIVE
Nitrite: NEGATIVE
Protein, ur: 100 mg/dL — AB
Specific Gravity, Urine: 1.014 (ref 1.005–1.030)
pH: 6 (ref 5.0–8.0)

## 2019-06-21 MED ORDER — ATORVASTATIN CALCIUM 20 MG PO TABS
20.0000 mg | ORAL_TABLET | Freq: Every day | ORAL | Status: DC
  Administered 2019-06-21 – 2019-09-10 (×82): 20 mg via ORAL
  Filled 2019-06-21 (×82): qty 1

## 2019-06-21 MED ORDER — AMLODIPINE BESYLATE 5 MG PO TABS
10.0000 mg | ORAL_TABLET | Freq: Every day | ORAL | Status: DC
  Administered 2019-06-21 – 2019-09-10 (×82): 10 mg via ORAL
  Filled 2019-06-21 (×83): qty 2

## 2019-06-21 MED ORDER — BICTEGRAVIR-EMTRICITAB-TENOFOV 50-200-25 MG PO TABS
1.0000 | ORAL_TABLET | Freq: Every day | ORAL | Status: DC
  Administered 2019-06-21 – 2019-09-10 (×80): 1 via ORAL
  Filled 2019-06-21 (×86): qty 1

## 2019-06-21 MED ORDER — DONEPEZIL HCL 5 MG PO TABS
10.0000 mg | ORAL_TABLET | Freq: Every day | ORAL | Status: DC
  Administered 2019-06-21 – 2019-09-09 (×80): 10 mg via ORAL
  Filled 2019-06-21 (×90): qty 2

## 2019-06-21 MED ORDER — DIVALPROEX SODIUM 125 MG PO CSDR
250.0000 mg | DELAYED_RELEASE_CAPSULE | Freq: Two times a day (BID) | ORAL | Status: DC
  Administered 2019-06-21 – 2019-09-10 (×162): 250 mg via ORAL
  Filled 2019-06-21 (×170): qty 2

## 2019-06-21 MED ORDER — OLANZAPINE 5 MG PO TABS
5.0000 mg | ORAL_TABLET | Freq: Every day | ORAL | Status: DC | PRN
  Administered 2019-06-21 – 2019-07-06 (×5): 5 mg via ORAL
  Filled 2019-06-21 (×5): qty 1

## 2019-06-21 MED ORDER — LABETALOL HCL 100 MG PO TABS
100.0000 mg | ORAL_TABLET | Freq: Two times a day (BID) | ORAL | Status: DC
  Administered 2019-06-21 – 2019-09-10 (×151): 100 mg via ORAL
  Filled 2019-06-21 (×169): qty 1

## 2019-06-21 NOTE — ED Notes (Signed)
BEHAVIORAL HEALTH ROUNDING Patient sleeping: No. Patient alert : yes Behavior appropriate: Yes.  ; If no, describe:  Nutrition and fluids offered: yes Toileting and hygiene offered: Yes  Sitter present: q15 minute observations and security camera monitoring

## 2019-06-21 NOTE — ED Notes (Signed)
Hourly rounding reveals patient sleeping in room. No complaints, stable, in no acute distress. Q15 minute rounds and monitoring via Security Cameras to continue. 

## 2019-06-21 NOTE — ED Notes (Signed)
BEHAVIORAL HEALTH ROUNDING Patient sleeping: No. Patient alert   yes Behavior appropriate: Yes.  ; If no, describe:  Nutrition and fluids offered: yes Toileting and hygiene offered: Yes  Sitter present: q15 minute observations and security camera monitoring

## 2019-06-21 NOTE — ED Notes (Signed)
He is awake and standing in his room - walks around  - in and out of the doorway   IVC  Plan to refer him for geripsych placement

## 2019-06-21 NOTE — ED Notes (Signed)
Pt given sandwich tray drink and icecream

## 2019-06-21 NOTE — BH Assessment (Addendum)
Writer followed up with referrals for Psychiatric Hospitalization faxed to;    [facility] ([phone]-or- [phone]), declined due to [facility] ([phone]- [phone]), denied.   [facility] [phone]), Information faxed   [facility] ([phone]), Declined due to dementia    [facility] ([phone] -or- [phone]), Denied due to chronicity   [facility] ([phone] or [phone]), declined due to medical acuity.   [facility] ([phone] or [phone]), additional information faxed to them.   [facility] ([phone]), pending review   [facility] [phone])

## 2019-06-21 NOTE — ED Notes (Signed)
Hourly rounding reveals patient awake in room. No complaints, stable, in no acute distress. Q15 minute rounds and monitoring via Security Cameras to continue. 

## 2019-06-21 NOTE — ED Notes (Signed)
ED BHU  Is the patient under IVC or is there intent for IVC: Yes.   Is the patient medically cleared: Yes.   Is there vacancy in the ED BHU: Yes.   Is the population mix appropriate for patient: Yes.  He has confusion and altered elimination  Pt wearing a depend  He is a high fall risk    Is the patient awaiting placement in inpatient or outpatient setting: Yes.   Has the patient had a psychiatric consult: Yes.   Survey of unit performed for contraband, proper placement and condition of furniture, tampering with fixtures in bathroom, shower, and each patient room: Yes.  ; Findings:  APPEARANCE/BEHAVIOR Calm and cooperative NEURO ASSESSMENT Orientation: oriented to self   Attempts made to orient him to place time and situation Denies pain Hallucinations: No.None noted (Hallucinations) Speech: Normal Gait: normal RESPIRATORY ASSESSMENT Even  Unlabored respirations  CARDIOVASCULAR ASSESSMENT Pulses equal   regular rate  Skin warm and dry   GASTROINTESTINAL ASSESSMENT no GI complaint EXTREMITIES Full ROM  PLAN OF CARE Provide calm/safe environment. Vital signs assessed twice daily. ED BHU Assessment once each 12-hour shift.  Assure the ED provider has rounded once each shift. Provide and encourage hygiene. Provide redirection as needed. Assess for escalating behavior; address immediately and inform ED provider.  Assess family dynamic and appropriateness for visitation as needed: Yes.  ; If necessary, describe findings:  Educate the patient/family about BHU procedures/visitation: Yes.  ; If necessary, describe findings:

## 2019-06-21 NOTE — ED Notes (Signed)
BEHAVIORAL HEALTH ROUNDING Patient sleeping: No. Patient alert  - lying in bed at this time  Behavior appropriate: Yes.  ; If no, describe:  Nutrition and fluids offered: yes Toileting and hygiene offered: Yes  Sitter present: q15 minute observations and security camera monitoring   ENVIRONMENTAL ASSESSMENT Potentially harmful objects out of patient reach: Yes.   Personal belongings secured: Yes.   Patient dressed in hospital provided attire only: Yes.   Plastic bags out of patient reach: Yes.   Patient care equipment (cords, cables, call bells, lines, and drains) shortened, removed, or accounted for: Yes.   Equipment and supplies removed from bottom of stretcher: Yes.   Potentially toxic materials out of patient reach: Yes.   Sharps container removed or out of patient reach: Yes.

## 2019-06-21 NOTE — ED Notes (Signed)
Referral information for Psychiatric Hospitalization faxed to;   [facility] [phone]),   . [facility] [phone]- [phone]) [phone]  . [facility] [phone]),   . [facility] [phone] -or- [phone]),   . [facility] [phone] or [phone])  . [facility] [phone] or [phone]),   . [facility] [phone]).  [facility] [phone])
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
## 2019-06-21 NOTE — ED Notes (Signed)
BEHAVIORAL HEALTH ROUNDING Patient sleeping: No. Patient alert: yes Behavior appropriate: Yes.  ; If no, describe:  Nutrition and fluids offered: yes Toileting and hygiene offered: Yes  Sitter present: q15 minute observations and security camera monitoring

## 2019-06-22 DIAGNOSIS — B20 Human immunodeficiency virus [HIV] disease: Secondary | ICD-10-CM | POA: Diagnosis not present

## 2019-06-22 DIAGNOSIS — Z8673 Personal history of transient ischemic attack (TIA), and cerebral infarction without residual deficits: Secondary | ICD-10-CM | POA: Diagnosis not present

## 2019-06-22 DIAGNOSIS — F0281 Dementia in other diseases classified elsewhere with behavioral disturbance: Secondary | ICD-10-CM | POA: Diagnosis not present

## 2019-06-22 DIAGNOSIS — F29 Unspecified psychosis not due to a substance or known physiological condition: Secondary | ICD-10-CM | POA: Diagnosis not present

## 2019-06-22 DIAGNOSIS — N182 Chronic kidney disease, stage 2 (mild): Secondary | ICD-10-CM | POA: Diagnosis not present
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
## 2019-06-22 NOTE — ED Notes (Signed)
Pt. Up using bathroom, pt. Returned to room with steady gait.

## 2019-06-22 NOTE — ED Provider Notes (Signed)
-----------------------------------------   7:07 AM on 06/22/2019 -----------------------------------------   Blood pressure (!) 196/80, pulse 75, temperature 97.7 F (36.5 C), temperature source Oral, resp. rate 16, height 5\' 4"  (1.626 m), weight 65.3 kg, SpO2 96 %.  The patient had no acute events since last update.  Calm and cooperative at this time.  Disposition is pending per Psychiatry/Behavioral Medicine team recommendations.     Alfred Levins, [place], MD 06/22/19 [phone]

## 2019-06-22 NOTE — ED Notes (Signed)
Meal tray provided.
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
## 2019-06-22 NOTE — ED Notes (Signed)
Pt. Requested and was given snack and drink before bed.

## 2019-06-22 NOTE — ED Notes (Signed)
Pt. Currently laying in bed sleeping.

## 2019-06-23 DIAGNOSIS — F0281 Dementia in other diseases classified elsewhere with behavioral disturbance: Secondary | ICD-10-CM

## 2019-06-23 DIAGNOSIS — Z8673 Personal history of transient ischemic attack (TIA), and cerebral infarction without residual deficits: Secondary | ICD-10-CM

## 2019-06-23 DIAGNOSIS — B20 Human immunodeficiency virus [HIV] disease: Secondary | ICD-10-CM | POA: Diagnosis not present

## 2019-06-23 DIAGNOSIS — F29 Unspecified psychosis not due to a substance or known physiological condition: Secondary | ICD-10-CM | POA: Diagnosis not present

## 2019-06-23 DIAGNOSIS — N182 Chronic kidney disease, stage 2 (mild): Secondary | ICD-10-CM | POA: Diagnosis not present

## 2019-06-23 DIAGNOSIS — F1010 Alcohol abuse, uncomplicated: Secondary | ICD-10-CM

## 2019-06-23 NOTE — ED Notes (Signed)
Patient observed lying in bed with eyes closed  Even, unlabored respirations observed   NAD pt appears to be sleeping  I will continue to monitor along with every 15 minute visual observations and ongoing security camera monitoring    
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
## 2019-06-23 NOTE — ED Notes (Signed)
Hourly rounding reveals patient awake in room. No complaints, stable, in no acute distress. Q15 minute rounds and monitoring via Security Cameras to continue. 

## 2019-06-23 NOTE — ED Notes (Signed)
BEHAVIORAL HEALTH ROUNDING Patient sleeping: No. Patient alert : yes Behavior appropriate: Yes.  ; If no, describe:  Nutrition and fluids offered: yes Toileting and hygiene offered: Yes  Sitter present: q15 minute observations and security camera monitoring

## 2019-06-23 NOTE — ED Notes (Signed)
BEHAVIORAL HEALTH ROUNDING Patient sleeping: Yes.   Patient alert and oriented: eyes closed  Appears asleep Behavior appropriate: Yes.  ; If no, describe:  Nutrition and fluids offered: Yes  Toileting and hygiene offered: sleeping Sitter present: q 15 minute observations and security camera monitoring   ENVIRONMENTAL ASSESSMENT Potentially harmful objects out of patient reach: Yes.   Personal belongings secured: Yes.   Patient dressed in hospital provided attire only: Yes.   Plastic bags out of patient reach: Yes.   Patient care equipment (cords, cables, call bells, lines, and drains) shortened, removed, or accounted for: Yes.   Equipment and supplies removed from bottom of stretcher: Yes.   Potentially toxic materials out of patient reach: Yes.   Sharps container removed or out of patient reach: Yes.

## 2019-06-23 NOTE — BH Assessment (Signed)
Writer spoke with patient to complete updated/reassessment. Patient continues to smile and laugh, when writer asked him questions. Writer observed patient encountering and more verbal with other staff members

## 2019-06-23 NOTE — ED Notes (Signed)
Pt showered, new scrubs an clean sheets this morning.   lw edt

## 2019-06-23 NOTE — ED Notes (Signed)
BEHAVIORAL HEALTH ROUNDING Patient sleeping: No. Patient alert: yes Behavior appropriate: Yes.  ; If no, describe:  Nutrition and fluids offered: yes Toileting and hygiene offered: Yes  Sitter present: q15 minute observations and security camera monitoring
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
## 2019-06-23 NOTE — ED Notes (Signed)
Hourly rounding reveals patient sleeping in room. No complaints, stable, in no acute distress. Q15 minute rounds and monitoring via Security Cameras to continue. 

## 2019-06-23 NOTE — ED Notes (Signed)
Report to include Situation, Background, Assessment, and Recommendations received from [facility]. Patient alert and oriented, warm and dry, in no acute distress. Patient denies SI, HI, AVH and pain. Patient made aware of Q15 minute rounds and security cameras for their safety. Patient instructed to come to me with needs or concerns.

## 2019-06-23 NOTE — ED Notes (Signed)
Pt.up using bathroom, pt. Returned to room with steady gait.

## 2019-06-23 NOTE — ED Provider Notes (Signed)
-----------------------------------------   6:01 AM on 06/23/2019 -----------------------------------------   Blood pressure (!) 178/86, pulse 60, temperature 97.9 F (36.6 C), temperature source Oral, resp. rate 16, height 1.626 m (5\' 4" ), weight 65.3 kg, SpO2 100 %.  The patient is calm and cooperative at this time.  There have been no acute events since the last update.  Awaiting disposition plan from Behavioral Medicine and/or Social Work team(s).   Hinda Kehr, MD 06/23/19 [phone]

## 2019-06-23 NOTE — BH Assessment (Signed)
Writer spoke with patient to complete updated/reassessment. Patient when asked yes or no questions, he would laugh and say yes. When asked questions that were opened ended, he would continue to smile and laugh.

## 2019-06-24 ENCOUNTER — Telehealth: Payer: Self-pay

## 2019-06-24 ENCOUNTER — Ambulatory Visit (INDEPENDENT_AMBULATORY_CARE_PROVIDER_SITE_OTHER): Payer: Medicare HMO | Admitting: *Deleted

## 2019-06-24 ENCOUNTER — Ambulatory Visit: Payer: Self-pay | Admitting: *Deleted

## 2019-06-24 DIAGNOSIS — F02818 Dementia in other diseases classified elsewhere, unspecified severity, with other behavioral disturbance: Secondary | ICD-10-CM

## 2019-06-24 DIAGNOSIS — Z8673 Personal history of transient ischemic attack (TIA), and cerebral infarction without residual deficits: Secondary | ICD-10-CM

## 2019-06-24 DIAGNOSIS — F0281 Dementia in other diseases classified elsewhere with behavioral disturbance: Secondary | ICD-10-CM

## 2019-06-24 DIAGNOSIS — F29 Unspecified psychosis not due to a substance or known physiological condition: Secondary | ICD-10-CM | POA: Diagnosis not present
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
## 2019-06-24 NOTE — ED Notes (Signed)
IVC/Pending placement 

## 2019-06-24 NOTE — Telephone Encounter (Signed)
-----   Message from [name], MD sent at 06/24/2019 11:45 AM EST ----- Negative TB screening. Can fax results to assisted living if needed

## 2019-06-24 NOTE — ED Notes (Signed)
ED BHU [facility] Is the patient under IVC or is there intent for IVC: Yes.   Is the patient medically cleared: Yes.   Is there vacancy in the ED BHU: Yes.   Is the population mix appropriate for patient: Yes.   Is the patient awaiting placement in inpatient or outpatient setting: Yes.  He is awaiting placement Has the patient had a psychiatric consult: Yes.   Survey of unit performed for contraband, proper placement and condition of furniture, tampering with fixtures in bathroom, shower, and each patient room: Yes.  ; Findings:  APPEARANCE/BEHAVIOR Calm and cooperative NEURO ASSESSMENT Orientation: oriented to self  Attempts made to reorient him to place time and situation  He denies pain Hallucinations: No.None noted (Hallucinations)  Denies at this time  Speech: Normal Gait: normal RESPIRATORY ASSESSMENT Even  Unlabored respirations  CARDIOVASCULAR ASSESSMENT Pulses equal   regular rate  Skin warm and dry   GASTROINTESTINAL ASSESSMENT no GI complaint EXTREMITIES Full ROM  PLAN OF CARE Provide calm/safe environment. Vital signs assessed twice daily. ED BHU Assessment once each 12-hour shift. Assure the ED provider has rounded once each shift. Provide and encourage hygiene. Provide redirection as needed. Assess for escalating behavior; address immediately and inform ED provider.  Assess family dynamic and appropriateness for visitation as needed: Yes.  ; If necessary, describe findings:  Educate the patient/family about BHU procedures/visitation: Yes.  ; If necessary, describe findings:
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
## 2019-06-24 NOTE — Telephone Encounter (Signed)
Patient's daughter advised as below.  °

## 2019-06-24 NOTE — ED Notes (Signed)
BEHAVIORAL HEALTH ROUNDING Patient sleeping: No. Patient alert: yes Behavior appropriate: Yes.  ; If no, describe:  Nutrition and fluids offered: yes Toileting and hygiene offered: Yes  Sitter present: q15 minute observations and security camera monitoring

## 2019-06-24 NOTE — Telephone Encounter (Signed)
Patient is currently admitted at the [facility]. They are working on placement for now.

## 2019-06-24 NOTE — ED Notes (Signed)
Hourly rounding reveals patient sleeping in room. No complaints, stable, in no acute distress. Q15 minute rounds and monitoring via Security Cameras to continue. 

## 2019-06-24 NOTE — NC FL2 (Addendum)
[place] LEVEL OF CARE SCREENING TOOL     IDENTIFICATION  Patient Name: Peter Cox Birthdate: May 21, 1961 Sex: male Admission Date (Current Location): 06/20/2019  [place] and [place] Number:  Engineering geologist and Address:  [facility], [address] [place], [place] [address]      Provider Number: [id number]  Attending Physician Name and Address:  No att. providers found  Relative Name and Phone Number:       Current Level of Care: Hospital Recommended Level of Care: Memory Care Prior Approval Number:    Date Approved/Denied:   PASRR Number:    Discharge Plan: Other (Comment)(memory care/locked unit)    Current Diagnoses: Patient Active Problem List   Diagnosis Date Noted  . History of substance abuse ([place]) 09/17/2018  . Urinary incontinence 09/17/2018  . Bilateral primary osteoarthritis of knee 09/13/2018  . Decreased activities of daily living (ADL) 09/13/2018  . Alcohol abuse 11/24/2014  . Dementia associated with other underlying disease with behavioral disturbance ([place]) 07/02/2014  . History of CVA (cerebrovascular accident) 07/02/2014  . CKD stage 2 11/20/2008  . Human immunodeficiency virus (HIV) disease ([place]) 08/17/2008  . HLD (hyperlipidemia) 06/26/2008  . Essential hypertension 06/24/2008    Orientation RESPIRATION BLADDER Height & Weight     Self  Normal Incontinent Weight: 65.3 kg Height:  5\' 4"  (162.6 cm)  BEHAVIORAL SYMPTOMS/MOOD NEUROLOGICAL BOWEL NUTRITION STATUS  Wanderer, Dangerous to self, others or property   Incontinent Diet  AMBULATORY STATUS COMMUNICATION OF NEEDS Skin   Limited Assist Verbally(episodes of being non verbal) Normal                       Personal Care Assistance Level of Assistance  Bathing, Feeding, Dressing Bathing Assistance: Limited assistance Feeding assistance: Limited assistance Dressing Assistance: Limited assistance     Functional Limitations Info              SPECIAL CARE FACTORS FREQUENCY                       Contractures      Additional Factors Info                  Current Medications (06/24/2019):  This is the current hospital active medication list Current Facility-Administered Medications  Medication Dose Route Frequency Provider Last Rate Last Admin  . amLODipine (NORVASC) tablet 10 mg  10 mg Oral Daily Earleen Newport, MD   10 mg at 06/24/19 0934  . atorvastatin (LIPITOR) tablet 20 mg  20 mg Oral Daily Earleen Newport, MD   20 mg at 06/24/19 0934  . bictegravir-emtricitabine-tenofovir AF (BIKTARVY) 50-200-25 MG per tablet 1 tablet  1 tablet Oral Daily Earleen Newport, MD   1 tablet at 06/24/19 0933  . divalproex (DEPAKOTE SPRINKLE) capsule 250 mg  250 mg Oral Q12H Earleen Newport, MD   250 mg at 06/24/19 0933  . donepezil (ARICEPT) tablet 10 mg  10 mg Oral QHS Earleen Newport, MD   10 mg at 06/23/19 2112  . labetalol (NORMODYNE) tablet 100 mg  100 mg Oral BID Earleen Newport, MD   100 mg at 06/24/19 0933  . OLANZapine (ZYPREXA) tablet 5 mg  5 mg Oral Daily PRN Earleen Newport, MD   5 mg at 06/23/19 9417   Current Outpatient Medications  Medication Sig Dispense Refill  . amLODipine (NORVASC) 10 MG tablet Take 1  tablet (10 mg total) by mouth daily. 30 tablet 11  . atorvastatin (LIPITOR) 20 MG tablet Take 20 mg by mouth daily.    . bictegravir-emtricitabine-tenofovir AF (BIKTARVY) 50-200-25 MG TABS tablet Take 1 tablet by mouth daily. 30 tablet 2  . divalproex (DEPAKOTE SPRINKLE) 125 MG capsule Take 2 capsules (250 mg total) by mouth every 12 (twelve) hours. 60 capsule 2  . donepezil (ARICEPT) 10 MG tablet Take 1/2 tablet daily for 2 weeks, then increase to 1 tablet daily (Patient taking differently: Take 10 mg by mouth daily. ) 30 tablet 11  . labetalol (NORMODYNE) 100 MG tablet Take 1 tablet (100 mg total) by mouth 2 (two) times daily. 60 tablet 5  . OLANZapine (ZYPREXA) 5 MG  tablet Take 1 tablet (5 mg total) by mouth daily as needed (agitation). 30 tablet 3     Discharge Medications: Please see discharge summary for a list of discharge medications.  Relevant Imaging Results:  Relevant Lab Results:   Additional Information SS# [id number]  Anselm Pancoast, RN

## 2019-06-24 NOTE — ED Notes (Signed)
Hourly rounding reveals patient in room. No complaints, stable, in no acute distress. Q15 minute rounds and monitoring via Security Cameras to continue. 

## 2019-06-24 NOTE — ED Notes (Signed)
BEHAVIORAL HEALTH ROUNDING Patient sleeping: No. Patient alert : yes Behavior appropriate: Yes.  ; If no, describe:  Nutrition and fluids offered: yes Toileting and hygiene offered: Yes  Sitter present: q15 minute observations and security camera monitoring

## 2019-06-24 NOTE — ED Notes (Signed)
Patient observed lying in bed with eyes closed  Even, unlabored respirations observed   NAD pt appears to be sleeping  I will continue to monitor along with every 15 minute visual observations and ongoing security camera monitoring    

## 2019-06-24 NOTE — ED Notes (Signed)
BEHAVIORAL HEALTH ROUNDING Patient sleeping: Yes.   Patient alert and oriented: eyes closed  Appears asleep Behavior appropriate: Yes.  ; If no, describe:  Nutrition and fluids offered: Yes  Toileting and hygiene offered: sleeping Sitter present: q 15 minute observations and security camera monitoring  ENVIRONMENTAL ASSESSMENT Potentially harmful objects out of patient reach: Yes.   Personal belongings secured: Yes.   Patient dressed in hospital provided attire only: Yes.   Plastic bags out of patient reach: Yes.   Patient care equipment (cords, cables, call bells, lines, and drains) shortened, removed, or accounted for: Yes.   Equipment and supplies removed from bottom of stretcher: Yes.   Potentially toxic materials out of patient reach: Yes.   Sharps container removed or out of patient reach: Yes.

## 2019-06-24 NOTE — ED Notes (Signed)
Report to include Situation, Background, Assessment, and Recommendations received from Amy RN. Patient alert and disoriented, warm and dry, in no acute distress.UTA SI, HI, AVH and pain. Patient made aware of Q15 minute rounds and security cameras for their safety. Patient instructed to come to me with needs or concerns.

## 2019-06-24 NOTE — ED Provider Notes (Signed)
-----------------------------------------   9:14 AM on 06/24/2019 -----------------------------------------   Blood pressure (!) 165/79, pulse 72, temperature 98 F (36.7 C), temperature source Oral, resp. rate 20, height 5\' 4"  (1.626 m), weight 65.3 kg, SpO2 100 %.  The patient is calm and cooperative at this time.  There have been no acute events since the last update.  Awaiting disposition plan from Behavioral Medicine and/or Social Work team(s).   Arta Silence, MD 06/24/19 [phone]

## 2019-06-25 DIAGNOSIS — F29 Unspecified psychosis not due to a substance or known physiological condition: Secondary | ICD-10-CM | POA: Diagnosis not present

## 2019-06-25 NOTE — NC FL2 (Signed)
[place] LEVEL OF CARE SCREENING TOOL     IDENTIFICATION  Patient Name: Peter Cox Birthdate: [birth year] Sex: male Admission Date (Current Location): 06/20/2019  [place] and [place] Number:  Engineering geologist and Address:  [facility], [address], [place], [place] [address]      Provider Number: [id number]  Attending Physician Name and Address:  No att. providers found  Relative Name and Phone Number:       Current Level of Care: Hospital Recommended Level of Care: Memory Care Prior Approval Number:    Date Approved/Denied:   PASRR Number:    Discharge Plan: Other (Comment)(memory care/locked unit)    Current Diagnoses: Patient Active Problem List   Diagnosis Date Noted  . History of substance abuse ([place]) 09/17/2018  . Urinary incontinence 09/17/2018  . Bilateral primary osteoarthritis of knee 09/13/2018  . Decreased activities of daily living (ADL) 09/13/2018  . Alcohol abuse 11/24/2014  . Dementia associated with other underlying disease with behavioral disturbance ([place]) 07/02/2014  . History of CVA (cerebrovascular accident) 07/02/2014  . CKD stage 2 11/20/2008  . Human immunodeficiency virus (HIV) disease ([place]) 08/17/2008  . HLD (hyperlipidemia) 06/26/2008  . Essential hypertension 06/24/2008    Orientation RESPIRATION BLADDER Height & Weight     Self  Normal Incontinent Weight: 65.3 kg Height:  5\' 4"  (162.6 cm)  BEHAVIORAL SYMPTOMS/MOOD NEUROLOGICAL BOWEL NUTRITION STATUS  Wanderer, Dangerous to self, others or property   Incontinent Diet  AMBULATORY STATUS COMMUNICATION OF NEEDS Skin   Limited Assist Verbally(episodes of being non verbal) Normal                       Personal Care Assistance Level of Assistance  Bathing, Feeding, Dressing Bathing Assistance: Limited assistance Feeding assistance: Limited assistance Dressing Assistance: Limited assistance     Functional Limitations Info              SPECIAL CARE FACTORS FREQUENCY                       Contractures      Additional Factors Info                  Current Medications (06/25/2019):  This is the current hospital active medication list Current Facility-Administered Medications  Medication Dose Route Frequency Provider Last Rate Last Admin  . amLODipine (NORVASC) tablet 10 mg  10 mg Oral Daily Earleen Newport, MD   10 mg at 06/25/19 3845  . atorvastatin (LIPITOR) tablet 20 mg  20 mg Oral Daily Earleen Newport, MD   20 mg at 06/25/19 3646  . bictegravir-emtricitabine-tenofovir AF (BIKTARVY) 50-200-25 MG per tablet 1 tablet  1 tablet Oral Daily Earleen Newport, MD   1 tablet at 06/25/19 8032  . divalproex (DEPAKOTE SPRINKLE) capsule 250 mg  250 mg Oral Q12H Earleen Newport, MD   250 mg at 06/25/19 1224  . donepezil (ARICEPT) tablet 10 mg  10 mg Oral QHS Earleen Newport, MD   10 mg at 06/24/19 2139  . labetalol (NORMODYNE) tablet 100 mg  100 mg Oral BID Earleen Newport, MD   100 mg at 06/25/19 8250  . OLANZapine (ZYPREXA) tablet 5 mg  5 mg Oral Daily PRN Earleen Newport, MD   5 mg at 06/23/19 0370   Current Outpatient Medications  Medication Sig Dispense Refill  . amLODipine (NORVASC) 10 MG tablet Take 1  tablet (10 mg total) by mouth daily. 30 tablet 11  . atorvastatin (LIPITOR) 20 MG tablet Take 20 mg by mouth daily.    . bictegravir-emtricitabine-tenofovir AF (BIKTARVY) 50-200-25 MG TABS tablet Take 1 tablet by mouth daily. 30 tablet 2  . divalproex (DEPAKOTE SPRINKLE) 125 MG capsule Take 2 capsules (250 mg total) by mouth every 12 (twelve) hours. 60 capsule 2  . donepezil (ARICEPT) 10 MG tablet Take 1/2 tablet daily for 2 weeks, then increase to 1 tablet daily (Patient taking differently: Take 10 mg by mouth daily. ) 30 tablet 11  . labetalol (NORMODYNE) 100 MG tablet Take 1 tablet (100 mg total) by mouth 2 (two) times daily. 60 tablet 5  . OLANZapine (ZYPREXA) 5 MG  tablet Take 1 tablet (5 mg total) by mouth daily as needed (agitation). 30 tablet 3     Discharge Medications: Please see discharge summary for a list of discharge medications.  Relevant Imaging Results:  Relevant Lab Results:   Additional Information SS# [id number]  Anselm Pancoast, RN

## 2019-06-25 NOTE — ED Notes (Signed)
Hourly rounding reveals patient in room. No complaints, stable, in no acute distress. Q15 minute rounds and monitoring via Security Cameras to continue. 

## 2019-06-25 NOTE — Care Management (Signed)
RN CM: Tawni Carnes and request for memory care placement to [facility] in [place] in [place].

## 2019-06-25 NOTE — ED Notes (Signed)
Meal tray provided.

## 2019-06-25 NOTE — TOC Progression Note (Signed)
Transition of Care [facility]) - Progression Note    Patient Details  Name: Peter Cox MRN: [id number] Date of Birth: 12/30/1960  Transition of [facility]) CM/SW [place], RN Phone Number: 06/25/2019, 12:40 PM  Clinical Narrative:    APS social workers here to interview patient. Interview confirmed need for memory care unit. RN CM called to [facility] [place] requesting memory care bed. Sent Fl2 and admissions will review.    Expected Discharge Plan: Memory Care    Expected Discharge Plan and Services Expected Discharge Plan: Memory Care       Living arrangements for the past 2 months: Single Family Home                                       Social Determinants of Health (SDOH) Interventions    Readmission Risk Interventions No flowsheet data found.

## 2019-06-25 NOTE — Chronic Care Management (AMB) (Signed)
  Chronic Care Management    Clinical Social Work Follow Up Note  06/25/2019 Name: Peter Cox MRN: [id number] DOB: May 30, 1961  Peter Cox is a 59 y.o. year old male who is a primary care patient of Bacigalupo, Dionne Bucy, MD. The CCM team was consulted for assistance with Level of Care Concerns.   Review of patient status, including review of consultants reports, other relevant assessments, and collaboration with appropriate care team members and the patient's provider was performed as part of comprehensive patient evaluation and provision of chronic care management services.    Advanced Directives Status: <no information> See Care Plan for related entries.   Facility-Administered Encounter Medications as of 06/24/2019  Medication  . amLODipine (NORVASC) tablet 10 mg  . atorvastatin (LIPITOR) tablet 20 mg  . bictegravir-emtricitabine-tenofovir AF (BIKTARVY) 50-200-25 MG per tablet 1 tablet  . divalproex (DEPAKOTE SPRINKLE) capsule 250 mg  . donepezil (ARICEPT) tablet 10 mg  . labetalol (NORMODYNE) tablet 100 mg  . OLANZapine (ZYPREXA) tablet 5 mg   Outpatient Encounter Medications as of 06/24/2019  Medication Sig  . amLODipine (NORVASC) 10 MG tablet Take 1 tablet (10 mg total) by mouth daily.  Marland Kitchen atorvastatin (LIPITOR) 20 MG tablet Take 20 mg by mouth daily.  . bictegravir-emtricitabine-tenofovir AF (BIKTARVY) 50-200-25 MG TABS tablet Take 1 tablet by mouth daily.  . divalproex (DEPAKOTE SPRINKLE) 125 MG capsule Take 2 capsules (250 mg total) by mouth every 12 (twelve) hours.  Marland Kitchen donepezil (ARICEPT) 10 MG tablet Take 1/2 tablet daily for 2 weeks, then increase to 1 tablet daily (Patient taking differently: Take 10 mg by mouth daily. )  . labetalol (NORMODYNE) 100 MG tablet Take 1 tablet (100 mg total) by mouth 2 (two) times daily.  Marland Kitchen OLANZapine (ZYPREXA) 5 MG tablet Take 1 tablet (5 mg total) by mouth daily as needed (agitation).     Goals Addressed            This Visit's Progress    . "I need help finding placement for my father" (pt-stated)       Current Barriers:  . Level of care concerns associated with Dementia and History of CVA  Clinical Social Work Clinical Goal(s):  Marland Kitchen Over the next 90 days, patient will work with SW to address concerns related to placement in a memory care facility  Interventions:  . Collaboration phone call to inpatient case manager to discuss patient's disposition . Confirmed that patient is a danger to self and others if left unsupervised, case manager agrees to seek ALF placement for patient . Previous bed offer at [facility] discussed, however has now been delayed  due to [place]  . Patient Self Care Activities:  . Attends all scheduled provider appointments . Unable to perform ADLs independently . Unable to perform IADLs independently  Please see past updates related to this goal by clicking on the "Past Updates" button in the selected goal          Follow Up Plan: SW will reach out to client by phone to follow up [place] placement plans    [place], Marceline Worker  [facility] [phone]

## 2019-06-25 NOTE — ED Notes (Signed)
Report to include Situation, Background, Assessment, and Recommendations received from Amy RN. Patient alert and oriented, warm and dry, in no acute distress. Patient denies SI, HI, AVH and pain. Patient made aware of Q15 minute rounds and security cameras for their safety. Patient instructed to come to me with needs or concerns.  

## 2019-06-25 NOTE — ED Provider Notes (Signed)
-----------------------------------------   6:08 AM on 06/25/2019 -----------------------------------------   Blood pressure (!) 175/76, pulse 66, temperature 98.1 F (36.7 C), temperature source Oral, resp. rate 18, height 5\' 4"  (1.626 m), weight 65.3 kg, SpO2 100 %.  The patient is sleeping at this time.  There have been no acute events since the last update.  Awaiting disposition plan from Behavioral Medicine and/or Social Work team(s).   Paulette Blanch, MD 06/25/19 [phone]

## 2019-06-25 NOTE — ED Notes (Signed)
IVC/  PENDING  PLACEMENT 

## 2019-06-25 NOTE — ED Notes (Signed)
Ambulated to bathroom.  Pt back in bed

## 2019-06-25 NOTE — ED Notes (Signed)
Pt had an episode of incontinence.  Bed linen changed. Pt has taken a shower.

## 2019-06-25 NOTE — ED Notes (Signed)
Hourly rounding reveals patient sleeping in room. No complaints, stable, in no acute distress. Q15 minute rounds and monitoring via Security Cameras to continue. 

## 2019-06-25 NOTE — Patient Instructions (Signed)
Thank you allowing the Chronic Care Management Team to be a part of your care! It was a pleasure speaking with you today!  1. Please call this social worker with any questions or concerns regarding patient's placement needs  CCM (Chronic Care Management) Team   Neldon Labella RN, BSN Nurse Care Coordinator  [phone]  Ruben Reason PharmD  Clinical Pharmacist  [phone]   Lorenzo, LCSW Clinical Social Worker [phone]  Goals Addressed            This Visit's Progress   . "I need help finding placement for my father" (pt-stated)       Current Barriers:  . Level of care concerns associated with Dementia and History of CVA  Clinical Social Work Clinical Goal(s):  Marland Kitchen Over the next 90 days, patient will work with SW to address concerns related to placement in a memory care facility  Interventions:  . Collaboration phone call to inpatient case manager to discuss patient's disposition . Confirmed that patient is a danger to self and others if left unsupervised, case manager agrees to seek ALF placement for patient . Previous bed offer at [facility] discussed, however has now been delayed  due to [place]  . Patient Self Care Activities:  . Attends all scheduled provider appointments . Unable to perform ADLs independently . Unable to perform IADLs independently  Please see past updates related to this goal by clicking on the "Past Updates" button in the selected goal          The patient verbalized understanding of instructions provided today and declined a print copy of patient instruction materials.   The care management team will reach out to the patient's daughter again over the next 7-10 business days.

## 2019-06-26 ENCOUNTER — Telehealth: Payer: Self-pay

## 2019-06-26 DIAGNOSIS — F29 Unspecified psychosis not due to a substance or known physiological condition: Secondary | ICD-10-CM | POA: Diagnosis not present

## 2019-06-26 NOTE — ED Notes (Signed)
He has ambulated to the BR with a steady gait  - crackers and juice provided  He reports  " I am ready to go home - go get my stuff - I am ready to go - I feel better"  Pt reassured plan of care discussed including finding him a safe place to stay  He verbalized understanding

## 2019-06-26 NOTE — ED Notes (Signed)
BEHAVIORAL HEALTH ROUNDING Patient sleeping: No. Patient alert  yes Behavior appropriate: Yes.  ; If no, describe:  Nutrition and fluids offered: yes Toileting and hygiene offered: Yes  Sitter present: q15 minute observations and security camera monitoring

## 2019-06-26 NOTE — ED Notes (Signed)
IVC/Pending Placement 

## 2019-06-26 NOTE — ED Notes (Signed)
Hourly rounding reveals patient in room. No complaints, stable, in no acute distress. Q15 minute rounds and monitoring via Security Cameras to continue. 

## 2019-06-26 NOTE — ED Notes (Signed)
BEHAVIORAL HEALTH ROUNDING Patient sleeping: Yes.   Patient alert and oriented: eyes closed  Appears asleep Behavior appropriate: Yes.  ; If no, describe:  Nutrition and fluids offered: Yes  Toileting and hygiene offered: sleeping Sitter present: q 15 minute observations and security camera monitoring

## 2019-06-26 NOTE — ED Provider Notes (Signed)
-----------------------------------------   5:45 AM on 06/26/2019 -----------------------------------------   Blood pressure (!) 149/77, pulse 68, temperature (!) 97.5 F (36.4 C), temperature source Oral, resp. rate 17, height 5\' 4"  (1.626 m), weight 65.3 kg, SpO2 100 %.  The patient is sleeping at this time.  There have been no acute events since the last update.  Awaiting disposition plan from Behavioral Medicine and/or Social Work team(s).   Paulette Blanch, MD 06/26/19 [phone]

## 2019-06-26 NOTE — ED Notes (Signed)
BEHAVIORAL HEALTH ROUNDING Patient sleeping: No. Patient alert : yes Behavior appropriate: Yes.  ; If no, describe:  Nutrition and fluids offered: yes Toileting and hygiene offered: Yes  Sitter present: q15 minute observations and security camera monitoring

## 2019-06-26 NOTE — ED Notes (Signed)
Hourly rounding reveals patient sleeping in room. No complaints, stable, in no acute distress. Q15 minute rounds and monitoring via Security Cameras to continue. 

## 2019-06-26 NOTE — ED Notes (Signed)
ED BHU  Is the patient under IVC or is there intent for IVC: Yes.   Is the patient medically cleared: Yes.   Is there vacancy in the ED BHU: Yes.   Is the population mix appropriate for patient: Yes.   Is the patient awaiting placement in inpatient or outpatient setting: Yes.   Has the patient had a psychiatric consult: Yes.   Survey of unit performed for contraband, proper placement and condition of furniture, tampering with fixtures in bathroom, shower, and each patient room: Yes.  ; Findings:  APPEARANCE/BEHAVIOR Calm and cooperative NEURO ASSESSMENT Orientation: oriented to self and place   Reoriented to time and situation Hallucinations: No.None noted (Hallucinations) denies at this time Speech: Normal Gait: normal RESPIRATORY ASSESSMENT Even  Unlabored respirations  CARDIOVASCULAR ASSESSMENT Pulses equal   regular rate  Skin warm and dry   GASTROINTESTINAL ASSESSMENT no GI complaint EXTREMITIES Full ROM  PLAN OF CARE Provide calm/safe environment. Vital signs assessed twice daily. ED BHU Assessment once each 12-hour shift. Assure the ED provider has rounded once each shift. Provide and encourage hygiene. Provide redirection as needed. Assess for escalating behavior; address immediately and inform ED provider.  Assess family dynamic and appropriateness for visitation as needed: Yes.  ; If necessary, describe findings:  Educate the patient/family about BHU procedures/visitation: Yes.  ; If necessary, describe findings:

## 2019-06-26 NOTE — ED Notes (Signed)
Report to include Situation, Background, Assessment, and Recommendations received from Amy RN. Patient alert and oriented, warm and dry, in no acute distress. Patient denies SI, HI, AVH and pain. Patient made aware of Q15 minute rounds and security cameras for their safety. Patient instructed to come to me with needs or concerns.

## 2019-06-26 NOTE — ED Notes (Signed)
BEHAVIORAL HEALTH ROUNDING Patient sleeping: No. Patient alert and oriented to self and place  Behavior appropriate: Yes.  ; If no, describe:  Nutrition and fluids offered: yes Toileting and hygiene offered: Yes  Sitter present: q15 minute observations and security camera monitoring   ENVIRONMENTAL ASSESSMENT Potentially harmful objects out of patient reach: Yes.   Personal belongings secured: Yes.   Patient dressed in hospital provided attire only: Yes.   Plastic bags out of patient reach: Yes.   Patient care equipment (cords, cables, call bells, lines, and drains) shortened, removed, or accounted for: Yes.   Equipment and supplies removed from bottom of stretcher: Yes.   Potentially toxic materials out of patient reach: Yes.   Sharps container removed or out of patient reach: Yes.

## 2019-06-27 DIAGNOSIS — F29 Unspecified psychosis not due to a substance or known physiological condition: Secondary | ICD-10-CM | POA: Diagnosis not present

## 2019-06-27 NOTE — Care Management (Signed)
RN CM: Incoming call from daughter, advising she had lost her housing and was looking for a new home. Advised bed requests had been sent over for review to numerous places and was waiting for follow up.

## 2019-06-27 NOTE — ED Notes (Signed)
Hourly rounding reveals patient in room. No complaints, stable, in no acute distress. Q15 minute rounds and monitoring via Security Cameras to continue. 

## 2019-06-27 NOTE — ED Notes (Signed)
Patient voided on himself, bed wet, nurse changed linen and gave Patient new scrubs and wash cloth to wash up, Patient is calm and cooperative, will continue to monitor, He denies si/hi or avh, will continue to monitor, q 15 minute checks and camera surveillance in progress for safety.

## 2019-06-27 NOTE — ED Notes (Signed)
Patient ate 100% of lunch and beverage, no behavioral issues, will continue to monitor, Patient remains dry, no incontinent episodes since early this AM.Staff will continue to monitor.

## 2019-06-27 NOTE — ED Provider Notes (Signed)
-----------------------------------------   4:37 AM on 06/27/2019 -----------------------------------------   Blood pressure (!) 140/57, pulse 65, temperature 98 F (36.7 C), resp. rate 17, height 5\' 4"  (1.626 m), weight 65.3 kg, SpO2 100 %.  The patient had no acute events since last update.  Calm and cooperative at this time.  Disposition is pending per Psychiatry/Behavioral Medicine team recommendations.     Alfred Levins, [place], MD 06/27/19 [phone]

## 2019-06-27 NOTE — ED Notes (Signed)
Gave pt [place] tray and drink.AS

## 2019-06-27 NOTE — ED Notes (Signed)
Patient  Is up to the bathroom at this time.

## 2019-06-27 NOTE — ED Notes (Signed)
While taking patient's BP had to encourage to go to the bathroom because pt tends to forget to go to the bathroom and soils the bed. Pt got up and went to bathroom with more encouragement.AS

## 2019-06-27 NOTE — ED Notes (Signed)
Hourly rounding reveals patient in room. No complaints, stable, in no acute distress. Q15 minute rounds and monitoring via [employer] and [employer] to continue.   

## 2019-06-28 ENCOUNTER — Telehealth: Payer: Self-pay

## 2019-06-28 DIAGNOSIS — F29 Unspecified psychosis not due to a substance or known physiological condition: Secondary | ICD-10-CM | POA: Diagnosis not present

## 2019-06-28 LAB — SARS CORONAVIRUS 2 (TAT 6-24 HRS): SARS Coronavirus 2: NEGATIVE

## 2019-06-28 NOTE — ED Notes (Signed)
ED BHU  Is the patient under IVC or is there intent for IVC: Yes.   Is the patient medically cleared: Yes.   Is there vacancy in the ED BHU: Yes.   Is the population mix appropriate for patient: Yes.   Is the patient awaiting placement in inpatient or outpatient setting: Yes.   He is awaiting placement Has the patient had a psychiatric consult: Yes.   Survey of unit performed for contraband, proper placement and condition of furniture, tampering with fixtures in bathroom, shower, and each patient room: Yes.  ; Findings:  APPEARANCE/BEHAVIOR Calm and cooperative NEURO ASSESSMENT Orientation: oriented to self and place  Reorient to time and situation  He denies pain Hallucinations: No.None noted (Hallucinations) denies Speech: Normal Gait: normal RESPIRATORY ASSESSMENT Even  Unlabored respirations  CARDIOVASCULAR ASSESSMENT Pulses equal   regular rate  Skin warm and dry   GASTROINTESTINAL ASSESSMENT no GI complaint EXTREMITIES Full ROM  PLAN OF CARE Provide calm/safe environment. Vital signs assessed twice daily. ED BHU Assessment once each 12-hour shift. Assure the ED provider has rounded once each shift. Provide and encourage hygiene. Provide redirection as needed. Assess for escalating behavior; address immediately and inform ED provider.  Assess family dynamic and appropriateness for visitation as needed: Yes.  ; If necessary, describe findings:  Educate the patient/family about BHU procedures/visitation: Yes.  ; If necessary, describe findings:

## 2019-06-28 NOTE — ED Notes (Signed)
He has ambulated to and from the BR with a steady gait

## 2019-06-28 NOTE — ED Notes (Signed)
Patient observed lying in bed with eyes closed  Even, unlabored respirations observed   NAD pt appears to be sleeping  I will continue to monitor along with every 15 minute visual observations and ongoing security camera monitoring    

## 2019-06-28 NOTE — ED Notes (Signed)
IVC/Pending Placement 

## 2019-06-28 NOTE — ED Notes (Signed)
Pt given lunch meal tray. 

## 2019-06-28 NOTE — ED Provider Notes (Signed)
-----------------------------------------   1:54 AM on 06/28/2019 -----------------------------------------   Blood pressure (!) 145/93, pulse 65, temperature 98.2 F (36.8 C), temperature source Oral, resp. rate 16, height 1.626 m (5\' 4" ), weight 65.3 kg, SpO2 98 %.  The patient is calm and cooperative at this time.  There have been no acute events since the last update.  Awaiting disposition plan from Behavioral Medicine and/or Social Work team(s).   Hinda Kehr, MD 06/28/19 [phone]

## 2019-06-28 NOTE — ED Notes (Signed)
BEHAVIORAL HEALTH ROUNDING Patient sleeping: Yes.   Patient alert and oriented: eyes closed  Appears asleep Behavior appropriate: Yes.  ; If no, describe:  Nutrition and fluids offered: Yes  Toileting and hygiene offered: sleeping Sitter present: q 15 minute observations and security camera monitoring   ENVIRONMENTAL ASSESSMENT Potentially harmful objects out of patient reach: Yes.   Personal belongings secured: Yes.   Patient dressed in hospital provided attire only: Yes.   Plastic bags out of patient reach: Yes.   Patient care equipment (cords, cables, call bells, lines, and drains) shortened, removed, or accounted for: Yes.   Equipment and supplies removed from bottom of stretcher: Yes.   Potentially toxic materials out of patient reach: Yes.   Sharps container removed or out of patient reach: Yes.

## 2019-06-28 NOTE — ED Notes (Signed)
BEHAVIORAL HEALTH ROUNDING Patient sleeping: No. Patient alert : yes Behavior appropriate: Yes.  ; If no, describe:  Nutrition and fluids offered: yes Toileting and hygiene offered: Yes  Sitter present: q15 minute observations and security camera monitoring

## 2019-06-28 NOTE — ED Notes (Signed)
Breakfast meal tray given to pt.

## 2019-06-28 NOTE — ED Notes (Signed)
Pt had a complete linen change, took a shower and provided oral care.

## 2019-06-29 DIAGNOSIS — F29 Unspecified psychosis not due to a substance or known physiological condition: Secondary | ICD-10-CM | POA: Diagnosis not present

## 2019-06-29 NOTE — ED Notes (Signed)
Pt. Soiled pants, pt. Cleaned and changed by tech in bathroom.

## 2019-06-29 NOTE — ED Notes (Signed)
Pt had an episode of incontinence.  Pt given shower supplies and clean scrubs and took a shower. Bed changed and floor mopped.

## 2019-06-29 NOTE — ED Notes (Signed)
Pt given lunch tray.

## 2019-06-29 NOTE — ED Notes (Signed)
Pt. Up using bathroom, pt. Returned to room with steady gait.

## 2019-06-29 NOTE — ED Notes (Signed)
Pt very pleasant. He went to bathroom and I noticed bed was soiled. Pt was unaware he soiled himself. Assisted pt with washing up and changing clothes. A incontinent brief was applied due to patient forgetting to go to the bathroom.AS

## 2019-06-29 NOTE — ED Notes (Signed)
Pt given meal tray.

## 2019-06-30 DIAGNOSIS — F29 Unspecified psychosis not due to a substance or known physiological condition: Secondary | ICD-10-CM | POA: Diagnosis not present

## 2019-06-30 NOTE — ED Notes (Signed)
Report to include Situation, Background, Assessment, and Recommendations received from Amy B. RN. Patient alert, warm and dry, in no acute distress. Patient denies SI, HI, AVH and pain. Patient made aware of Q15 minute rounds and security cameras for their safety. Patient instructed to come to me with needs or concerns.

## 2019-06-30 NOTE — ED Notes (Signed)
Pt given meal tray.

## 2019-06-30 NOTE — ED Notes (Signed)
Hourly rounding reveals patient sleeping in room. No complaints, stable, in no acute distress. Q15 minute rounds and monitoring via Security Cameras to continue. 

## 2019-06-30 NOTE — ED Notes (Signed)
Pt asleep, breakfast tray placed on chair in rm.  

## 2019-06-30 NOTE — ED Notes (Signed)
Pt. Up using bathroom, pt. Returned to room with steady gait.

## 2019-06-30 NOTE — ED Notes (Signed)
Pt had another episode of incontinence.  Bed linen changed. Pt given depend and clean scrub pants.

## 2019-06-30 NOTE — ED Notes (Signed)
Pt had an episode of incontinence.  Bed linen changed by nurse tech.  Pt given clean pants and underwear.

## 2019-06-30 NOTE — ED Notes (Signed)
Hourly rounding reveals patient awake in room. No complaints, stable, in no acute distress. Q15 minute rounds and monitoring via Security Cameras to continue. 

## 2019-06-30 NOTE — ED Provider Notes (Signed)
-----------------------------------------   6:00 AM on 06/30/2019 -----------------------------------------   Blood pressure (!) 162/96, pulse (!) 55, temperature 98 F (36.7 C), temperature source Oral, resp. rate 16, height 5\' 4"  (1.626 m), weight 65.3 kg, SpO2 100 %.  The patient is calm and cooperative at this time.  There have been no acute events since the last update.  Awaiting disposition plan from Behavioral Medicine and/or Social Work team(s).   Paulette Blanch, MD 06/30/19 [phone]

## 2019-07-01 DIAGNOSIS — F29 Unspecified psychosis not due to a substance or known physiological condition: Secondary | ICD-10-CM | POA: Diagnosis not present

## 2019-07-01 NOTE — ED Notes (Signed)
Hourly rounding reveals patient sleeping in room. No complaints, stable, in no acute distress. Q15 minute rounds and monitoring via Security Cameras to continue. 

## 2019-07-01 NOTE — ED Notes (Signed)
Hourly rounding reveals patient awake in room. No complaints, stable, in no acute distress. Q15 minute rounds and monitoring via Security Cameras to continue. 

## 2019-07-01 NOTE — ED Notes (Signed)
Hourly rounding reveals patient asleep in room. No complaints, stable, in no acute distress. Q15 minute rounds and monitoring via [employer] to continue.

## 2019-07-01 NOTE — ED Provider Notes (Signed)
-----------------------------------------   9:03 AM on 07/01/2019 -----------------------------------------   Blood pressure (!) 168/71, pulse 67, temperature 98.3 F (36.8 C), temperature source Oral, resp. rate 16, height 5\' 4"  (1.626 m), weight 65.3 kg, SpO2 100 %.  The patient is calm and cooperative at this time.  There have been no acute events since the last update.  Awaiting disposition plan from Behavioral Medicine and/or Social Work team(s).   Carrie Mew, MD 07/01/19 [phone]

## 2019-07-01 NOTE — ED Notes (Signed)
Hourly rounding reveals patient in day room. No complaints, stable, in no acute distress. Q15 minute rounds and monitoring via Security Cameras to continue. 

## 2019-07-01 NOTE — ED Notes (Signed)
Hourly rounding reveals patient sleeping in room. Pt awakened and given breakfast tray. No complaints, stable, in no acute distress. Q15 minute rounds and monitoring via [employer] to continue.

## 2019-07-01 NOTE — ED Notes (Signed)
Report to include Situation, Background, Assessment, and Recommendations received from [place]. Patient alert and oriented, warm and dry, in no acute distress. Patient denies SI, HI, AVH and pain. Patient made aware of Q15 minute rounds and security cameras for their safety. Patient instructed to come to me with needs or concerns.

## 2019-07-02 DIAGNOSIS — F29 Unspecified psychosis not due to a substance or known physiological condition: Secondary | ICD-10-CM | POA: Diagnosis not present

## 2019-07-02 LAB — SARS CORONAVIRUS 2 (TAT 6-24 HRS): SARS Coronavirus 2: NEGATIVE

## 2019-07-02 NOTE — ED Notes (Signed)
BEHAVIORAL HEALTH ROUNDING Patient sleeping: No. Patient alert : yes Behavior appropriate: Yes.  ; If no, describe:  Nutrition and fluids offered: yes Toileting and hygiene offered: Yes  Sitter present: q15 minute observations and security camera monitoring

## 2019-07-02 NOTE — ED Notes (Signed)
BEHAVIORAL HEALTH ROUNDING Patient sleeping: Yes.   Patient alert and oriented: eyes closed  Appears asleep Behavior appropriate: Yes.  ; If no, describe:  Nutrition and fluids offered: Yes  Toileting and hygiene offered: sleeping Sitter present: q 15 minute observations and security camera monitoring

## 2019-07-02 NOTE — ED Notes (Signed)
Hourly rounding reveals patient sleeping in room. No complaints, stable, in no acute distress. Q15 minute rounds and monitoring via Security Cameras to continue. 

## 2019-07-02 NOTE — ED Notes (Signed)
ED BHU  Is the patient under IVC or is there intent for IVC: Yes.   Is the patient medically cleared: Yes.   Is there vacancy in the ED BHU: Yes.   Is the population mix appropriate for patient: Yes.   Is the patient awaiting placement in inpatient or outpatient setting: Yes. He is waiting for social work to find him a safe place to live   Has the patient had a psychiatric consult: Yes. - he is cleared by psychiatry   Survey of unit performed for contraband, proper placement and condition of furniture, tampering with fixtures in bathroom, shower, and each patient room: Yes.  ; Findings:  APPEARANCE/BEHAVIOR Calm and cooperative NEURO ASSESSMENT Orientation: oriented to self  Reoriented to time, place and situation  Hallucinations: No.None noted (Hallucinations) denies Speech: Normal Gait: normal RESPIRATORY ASSESSMENT Even  Unlabored respirations  CARDIOVASCULAR ASSESSMENT Pulses equal   regular rate  Skin warm and dry   GASTROINTESTINAL ASSESSMENT no GI complaint EXTREMITIES Full ROM  PLAN OF CARE Provide calm/safe environment. Vital signs assessed twice daily. ED BHU Assessment once each 12-hour shift.  Assure the ED provider has rounded once each shift. Provide and encourage hygiene. Provide redirection as needed. Assess for escalating behavior; address immediately and inform ED provider.  Assess family dynamic and appropriateness for visitation as needed: Yes.  ; If necessary, describe findings:  Educate the patient/family about BHU procedures/visitation: Yes.  ; If necessary, describe findings:

## 2019-07-02 NOTE — ED Notes (Signed)
Report to include Situation, Background, Assessment, and Recommendations received from [place]. Patient alert, warm and dry, in no acute distress. Patient denies SI, HI, AVH and pain. Patient made aware of Q15 minute rounds and security cameras for their safety. Patient instructed to come to me with needs or concerns.

## 2019-07-02 NOTE — ED Notes (Signed)
Hourly rounding reveals patient in day room. No complaints, stable, in no acute distress. Q15 minute rounds and monitoring via Security Cameras to continue. 

## 2019-07-02 NOTE — ED Notes (Signed)
BEHAVIORAL HEALTH ROUNDING Patient sleeping: No. Patient alert: yes Behavior appropriate: Yes.  ; If no, describe:  Nutrition and fluids offered: yes Toileting and hygiene offered: Yes  Sitter present: q15 minute observations and security camera monitoring

## 2019-07-02 NOTE — ED Notes (Signed)
Pt ambulated to bathroom 

## 2019-07-02 NOTE — ED Provider Notes (Signed)
-----------------------------------------   3:53 AM on 07/02/2019 -----------------------------------------   Blood pressure 134/85, pulse (!) 104, temperature 98 F (36.7 C), temperature source Oral, resp. rate 17, height 5\' 4"  (1.626 m), weight 65.3 kg, SpO2 100 %.  The patient had no acute events since last update.  Calm and cooperative at this time.  Disposition is pending per Psychiatry/Behavioral Medicine team recommendations.     Alfred Levins, [place], MD 07/02/19 [phone]

## 2019-07-02 NOTE — ED Notes (Addendum)
Patient back to his room after interacting with other patients in day room.

## 2019-07-02 NOTE — ED Notes (Addendum)
Patient in day room.

## 2019-07-02 NOTE — ED Notes (Signed)

## 2019-07-02 NOTE — ED Notes (Signed)
Patient observed lying in bed with eyes closed  Even, unlabored respirations observed   NAD pt appears to be sleeping  I will continue to monitor along with every 15 minute visual observations and ongoing security camera monitoring    

## 2019-07-02 NOTE — ED Notes (Signed)
Hourly rounding reveals patient awake in room. No complaints, stable, in no acute distress. Q15 minute rounds and monitoring via Security Cameras to continue. 

## 2019-07-03 DIAGNOSIS — F29 Unspecified psychosis not due to a substance or known physiological condition: Secondary | ICD-10-CM | POA: Diagnosis not present

## 2019-07-03 NOTE — ED Notes (Signed)
Hourly rounding reveals patient sleeping in room. No complaints, stable, in no acute distress. Q15 minute rounds and monitoring via Security Cameras to continue. 

## 2019-07-03 NOTE — ED Provider Notes (Signed)
-----------------------------------------   8:46 AM on 07/03/2019 -----------------------------------------   BP 129/70 (BP Location: Right Arm)   Pulse 64   Temp (!) 97.3 F (36.3 C) (Oral)   Resp 20   Ht 5\' 4"  (1.626 m)   Wt 65.3 kg   SpO2 100%   BMI 24.72 kg/m   The patient is calm and cooperative at this time.  There have been no acute events since the last update.  Awaiting disposition plan from Behavioral Medicine and/or Social Work team(s).  Awaiting placement.    Lilia Pro., MD 07/03/19 [phone]

## 2019-07-03 NOTE — ED Notes (Signed)
IVC papers expire 1/14 2019 pending placement

## 2019-07-03 NOTE — ED Notes (Signed)
Hourly rounding reveals patient awake in room. No complaints, stable, in no acute distress. Q15 minute rounds and monitoring via Security Cameras to continue. 

## 2019-07-03 NOTE — ED Notes (Signed)
Report to include Situation, Background, Assessment, and Recommendations received from [facility]. Patient alert and oriented, warm and dry, in no acute distress. Unable to assess SI, HI, AVH and pain. Patient made aware of Q15 minute rounds and security cameras for their safety. Patient instructed to come to me with needs or concerns.

## 2019-07-04 ENCOUNTER — Telehealth: Payer: Medicare HMO | Admitting: Neurology

## 2019-07-04 DIAGNOSIS — F29 Unspecified psychosis not due to a substance or known physiological condition: Secondary | ICD-10-CM | POA: Diagnosis not present

## 2019-07-04 NOTE — ED Notes (Signed)
Hourly rounding reveals patient in room. No complaints, stable, in no acute distress. Q15 minute rounds and monitoring via Security Cameras to continue. 

## 2019-07-04 NOTE — ED Provider Notes (Signed)
-----------------------------------------   7:09 AM on 07/04/2019 -----------------------------------------   Blood pressure (!) 148/73, pulse 67, temperature 97.6 F (36.4 C), temperature source Oral, resp. rate 17, height 5\' 4"  (1.626 m), weight 65.3 kg, SpO2 100 %.  The patient is calm and cooperative at this time.  There have been no acute events since the last update.  Awaiting disposition plan from Behavioral Medicine and/or Social Work team(s).   Arta Silence, MD 07/04/19 [phone]

## 2019-07-04 NOTE — ED Notes (Signed)
Pt incontinent of urine.  Pt given clean scrubs and supplies for a shower.  Bed linen changed.

## 2019-07-04 NOTE — ED Notes (Signed)
Pt incontinent of urine.  Clean scrubs and underwear provided. Maintained on 15 minute checks and observation by security camera for safety.

## 2019-07-04 NOTE — ED Notes (Signed)
Pt needing to be re-directed to return to his room. Pt had enter the patient's room across the hall.

## 2019-07-05 DIAGNOSIS — F29 Unspecified psychosis not due to a substance or known physiological condition: Secondary | ICD-10-CM | POA: Diagnosis not present

## 2019-07-05 LAB — SARS CORONAVIRUS 2 (TAT 6-24 HRS): SARS Coronavirus 2: NEGATIVE

## 2019-07-05 NOTE — ED Notes (Signed)
Patient had episode of urinary incontinence. Patient provided with clean clothes and wipes. Patient's bed cleaned, and clean bedding/blankets placed on bed. Patient cleaned himself and changed into clean clothes. Patient's soiled clothes disposed of.

## 2019-07-05 NOTE — ED Notes (Signed)
Pt. Up using bathroom, pt. Returned to room with steady gait.

## 2019-07-05 NOTE — NC FL2 (Signed)
[place] LEVEL OF CARE SCREENING TOOL     IDENTIFICATION  Patient Name: Peter Cox Birthdate: 07/11/1960 Sex: male Admission Date (Current Location): 06/20/2019  [place] and [place] Number:  Engineering geologist and Address:  [facility], [address] [place], [place] [address]      Provider Number: [id number]  Attending Physician Name and Address:  No att. providers found  Relative Name and Phone Number:       Current Level of Care: Hospital Recommended Level of Care: [place] Prior Approval Number:    Date Approved/Denied:   PASRR Number:    Discharge Plan: Other (Comment)(Memory care)    Current Diagnoses: Patient Active Problem List   Diagnosis Date Noted  . History of substance abuse ([place]) 09/17/2018  . Urinary incontinence 09/17/2018  . Bilateral primary osteoarthritis of knee 09/13/2018  . Decreased activities of daily living (ADL) 09/13/2018  . Alcohol abuse 11/24/2014  . Dementia associated with other underlying disease with behavioral disturbance ([place]) 07/02/2014  . History of CVA (cerebrovascular accident) 07/02/2014  . CKD stage 2 11/20/2008  . Human immunodeficiency virus (HIV) disease ([place]) 08/17/2008  . HLD (hyperlipidemia) 06/26/2008  . Essential hypertension 06/24/2008    Orientation RESPIRATION BLADDER Height & Weight     Self  Normal Incontinent(occassional night time incontinence) Weight: 65.3 kg Height:  5\' 4"  (162.6 cm)  BEHAVIORAL SYMPTOMS/MOOD NEUROLOGICAL BOWEL NUTRITION STATUS  Wanderer, Dangerous to self, others or property   Continent Diet  AMBULATORY STATUS COMMUNICATION OF NEEDS Skin   Limited Assist Verbally(episodes of being non verbal) Normal                       Personal Care Assistance Level of Assistance  Bathing, Feeding, Dressing Bathing Assistance: Limited assistance Feeding assistance: Limited assistance Dressing Assistance: Limited assistance      Functional Limitations Info             SPECIAL CARE FACTORS FREQUENCY                       Contractures      Additional Factors Info                  Current Medications (07/05/2019):  This is the current hospital active medication list Current Facility-Administered Medications  Medication Dose Route Frequency Provider Last Rate Last Admin  . amLODipine (NORVASC) tablet 10 mg  10 mg Oral Daily Earleen Newport, MD   10 mg at 07/05/19 1003  . atorvastatin (LIPITOR) tablet 20 mg  20 mg Oral Daily Earleen Newport, MD   20 mg at 07/05/19 1003  . bictegravir-emtricitabine-tenofovir AF (BIKTARVY) 50-200-25 MG per tablet 1 tablet  1 tablet Oral Daily Earleen Newport, MD   1 tablet at 07/05/19 1004  . divalproex (DEPAKOTE SPRINKLE) capsule 250 mg  250 mg Oral Q12H Earleen Newport, MD   250 mg at 07/05/19 1001  . donepezil (ARICEPT) tablet 10 mg  10 mg Oral QHS Earleen Newport, MD   10 mg at 07/04/19 2221  . labetalol (NORMODYNE) tablet 100 mg  100 mg Oral BID Earleen Newport, MD   100 mg at 07/05/19 1004  . OLANZapine (ZYPREXA) tablet 5 mg  5 mg Oral Daily PRN Earleen Newport, MD   5 mg at 06/27/19 1011   Current Outpatient Medications  Medication Sig Dispense Refill  . amLODipine (NORVASC) 10 MG  tablet Take 1 tablet (10 mg total) by mouth daily. 30 tablet 11  . atorvastatin (LIPITOR) 20 MG tablet Take 20 mg by mouth daily.    . bictegravir-emtricitabine-tenofovir AF (BIKTARVY) 50-200-25 MG TABS tablet Take 1 tablet by mouth daily. 30 tablet 2  . divalproex (DEPAKOTE SPRINKLE) 125 MG capsule Take 2 capsules (250 mg total) by mouth every 12 (twelve) hours. 60 capsule 2  . donepezil (ARICEPT) 10 MG tablet Take 1/2 tablet daily for 2 weeks, then increase to 1 tablet daily (Patient taking differently: Take 10 mg by mouth daily. ) 30 tablet 11  . labetalol (NORMODYNE) 100 MG tablet Take 1 tablet (100 mg total) by mouth 2 (two) times daily. 60  tablet 5  . OLANZapine (ZYPREXA) 5 MG tablet Take 1 tablet (5 mg total) by mouth daily as needed (agitation). 30 tablet 3     Discharge Medications: Please see discharge summary for a list of discharge medications.  Relevant Imaging Results:  Relevant Lab Results:   Additional Information SS# [id number]  Anselm Pancoast, RN

## 2019-07-05 NOTE — ED Notes (Signed)
Patient eating breakfast, took morning medications, b/p elevated, He is asymptomatic, He took antihypertensive medications as ordered, Patient is pleasant, cooperative, will continue to monitor, denies Si/hi or avh.

## 2019-07-05 NOTE — TOC Progression Note (Signed)
Transition of Care [facility]) - Progression Note    Patient Details  Name: STUART MIRABILE MRN: [id number] Date of Birth: 04/22/1961  Transition of Care [facility]) CM/SW [place], RN Phone Number: 07/05/2019, 11:01 AM  Clinical Narrative:    Spoke with Freda Munro @ [facility]. Requested fl2 and doctor notes be faxed for review. Possible transfer to facility on Monday. Freda Munro states TB test will be completed on admission to facility and is not needed in hospital.   Expected Discharge Plan: Memory Care    Expected Discharge Plan and Services Expected Discharge Plan: Memory Care       Living arrangements for the past 2 months: Single Family Home                                       Social Determinants of Health (SDOH) Interventions    Readmission Risk Interventions No flowsheet data found.

## 2019-07-05 NOTE — ED Notes (Addendum)
Pt. Appears to be in a good mood.  Pt. Requested to have linens changed on bed.  Pt. Stated he had spilled drink.  Patients sheet and blanket changed and bed wiped down.  Pt. Requested and was given snack with juice.

## 2019-07-05 NOTE — ED Notes (Signed)
Patient took a shower, no signs of distress.

## 2019-07-05 NOTE — ED Provider Notes (Signed)
-----------------------------------------   6:01 AM on 07/05/2019 -----------------------------------------   Blood pressure (!) 190/107, pulse 87, temperature 98.1 F (36.7 C), temperature source Oral, resp. rate 16, height 5\' 4"  (1.626 m), weight 65.3 kg, SpO2 100 %.  The patient is sleeping at this time.  There have been no acute events since the last update.  Awaiting disposition plan from Behavioral Medicine and/or Social Work team(s).   Paulette Blanch, MD 07/05/19 [phone]

## 2019-07-05 NOTE — ED Notes (Signed)
Dinner tray provided

## 2019-07-05 NOTE — ED Notes (Signed)
Patient talked on the phone to His sister, no signs of distress.

## 2019-07-06 DIAGNOSIS — F29 Unspecified psychosis not due to a substance or known physiological condition: Secondary | ICD-10-CM | POA: Diagnosis not present

## 2019-07-06 NOTE — ED Notes (Signed)
BEHAVIORAL HEALTH ROUNDING Patient sleeping: No. Patient alert : yes Behavior appropriate: Yes.  ; If no, describe:  Nutrition and fluids offered: yes Toileting and hygiene offered: Yes  Sitter present: q15 minute observations and security camera monitoring

## 2019-07-06 NOTE — ED Notes (Signed)
ED BHU  Is the patient under IVC or is there intent for IVC: Yes.   Is the patient medically cleared: Yes.   Is there vacancy in the ED BHU: Yes.   Is the population mix appropriate for patient: Yes.   Is the patient awaiting placement in inpatient or outpatient setting: social work consult is in progress  - they are looking for him a safe place to live   Has the patient had a psychiatric consult:  He has been psychiatrically cleared    Survey of unit performed for contraband, proper placement and condition of furniture, tampering with fixtures in bathroom, shower, and each patient room: Yes.  ; Findings:  APPEARANCE/BEHAVIOR Calm and cooperative NEURO ASSESSMENT Orientation: oriented to self  Reoriented to place, time and situation   Denies pain Hallucinations: No.None noted (Hallucinations) denies  Speech: Normal Gait: normal RESPIRATORY ASSESSMENT Even  Unlabored respirations  CARDIOVASCULAR ASSESSMENT Pulses equal   regular rate  Skin warm and dry   GASTROINTESTINAL ASSESSMENT no GI complaint EXTREMITIES Full ROM  PLAN OF CARE Provide calm/safe environment. Vital signs assessed twice daily. ED BHU Assessment once each 12-hour shift. Assure the ED provider has rounded once each shift. Provide and encourage hygiene. Provide redirection as needed. Assess for escalating behavior; address immediately and inform ED provider.  Assess family dynamic and appropriateness for visitation as needed: Yes.  ; If necessary, describe findings:  Educate the patient/family about BHU procedures/visitation: Yes.  ; If necessary, describe findings:

## 2019-07-06 NOTE — ED Provider Notes (Signed)
-----------------------------------------   6:34 AM on 07/06/2019 -----------------------------------------   Blood pressure (!) 156/86, pulse 73, temperature 98.2 F (36.8 C), temperature source Oral, resp. rate 16, height 5\' 4"  (1.626 m), weight 65.3 kg, SpO2 93 %.  The patient is sleeping at this time.  There have been no acute events since the last update.  Awaiting disposition plan from Behavioral Medicine and/or Social Work team(s).   Paulette Blanch, MD 07/06/19 [phone]

## 2019-07-06 NOTE — ED Notes (Signed)
Pt. Up using bathroom, pt. Returned to room with steady gait.

## 2019-07-06 NOTE — ED Notes (Signed)
Patient observed lying in bed with eyes closed  Even, unlabored respirations observed   NAD pt appears to be sleeping  I will continue to monitor along with every 15 minute visual observations and ongoing security camera monitoring    

## 2019-07-06 NOTE — ED Notes (Signed)
BEHAVIORAL HEALTH ROUNDING Patient sleeping: Yes.   Patient alert and oriented: eyes closed  Appears asleep Behavior appropriate: Yes.  ; If no, describe:  Nutrition and fluids offered: Yes  Toileting and hygiene offered: sleeping Sitter present: q 15 minute observations and security camera monitoring

## 2019-07-06 NOTE — ED Notes (Signed)
BEHAVIORAL HEALTH ROUNDING Patient sleeping: Yes.   Patient alert and oriented: eyes closed  Appears asleep Behavior appropriate: Yes.  ; If no, describe:  Nutrition and fluids offered: Yes  Toileting and hygiene offered: sleeping Sitter present: q 15 minute observations and security camera monitoring   ENVIRONMENTAL ASSESSMENT Potentially harmful objects out of patient reach: Yes.   Personal belongings secured: Yes.   Patient dressed in hospital provided attire only: Yes.   Plastic bags out of patient reach: Yes.   Patient care equipment (cords, cables, call bells, lines, and drains) shortened, removed, or accounted for: Yes.   Equipment and supplies removed from bottom of stretcher: Yes.   Potentially toxic materials out of patient reach: Yes.   Sharps container removed or out of patient reach: Yes.

## 2019-07-07 DIAGNOSIS — F29 Unspecified psychosis not due to a substance or known physiological condition: Secondary | ICD-10-CM | POA: Diagnosis not present

## 2019-07-07 NOTE — ED Notes (Signed)
Hourly rounding reveals patient sleeping in room. No complaints, stable, in no acute distress. Q15 minute rounds and monitoring via Security Cameras to continue. 

## 2019-07-07 NOTE — ED Notes (Signed)
This NT gave pt breakfast tray at 9:15am.

## 2019-07-07 NOTE — ED Provider Notes (Signed)
-----------------------------------------   5:24 AM on 07/07/2019 -----------------------------------------   Blood pressure 133/81, pulse 100, temperature 97.9 F (36.6 C), temperature source Oral, resp. rate 16, height 5\' 4"  (1.626 m), weight 65.3 kg, SpO2 97 %.  The patient is calm and cooperative, currently resting comfortably at this time.  There have been no acute events since the last update.  Awaiting disposition plan from Behavioral Medicine and/or Social Work team(s).    Harvest Dark, MD 07/07/19 [phone]

## 2019-07-07 NOTE — ED Notes (Signed)
Hourly rounding reveals patient in room. No complaints, stable, in no acute distress. Q15 minute rounds and monitoring via Security Cameras to continue. 

## 2019-07-07 NOTE — ED Notes (Signed)
IVC with possible transfer on Monday to [facility]

## 2019-07-07 NOTE — ED Notes (Signed)
Report to include Situation, Background, Assessment, and Recommendations received from RN. Patient alert and oriented, warm and dry, in no acute distress. Patient denies SI, HI, AVH and pain. Patient made aware of Q15 minute rounds and security cameras for their safety. Patient instructed to come to me with needs or concerns.

## 2019-07-08 DIAGNOSIS — F29 Unspecified psychosis not due to a substance or known physiological condition: Secondary | ICD-10-CM | POA: Diagnosis not present

## 2019-07-08 NOTE — ED Notes (Signed)
Hourly rounding reveals patient asleep in room. No complaints, stable, in no acute distress. Q15 minute rounds and monitoring via [employer] to continue.

## 2019-07-08 NOTE — ED Notes (Signed)
Hourly rounding reveals patient sleeping in room. No complaints, stable, in no acute distress. Q15 minute rounds and monitoring via Security Cameras to continue. 

## 2019-07-08 NOTE — ED Notes (Signed)
Pt standing in day room in front of window. No needs expressed. Returned to bed after a few minutes

## 2019-07-08 NOTE — Care Management (Signed)
TOC CM: LVMM for [facility] following up on bed request.

## 2019-07-08 NOTE — ED Notes (Signed)
Hourly rounding reveals patient awake in room. No complaints, stable, in no acute distress. Q15 minute rounds and monitoring via Security Cameras to continue. 

## 2019-07-08 NOTE — ED Notes (Signed)
Patient ate 100% of breakfast and beverage, no signs of distress, watching tv quietly in His room, will continue to monitor, denies Si/hi or avh.

## 2019-07-08 NOTE — ED Provider Notes (Signed)
-----------------------------------------   5:08 AM on 07/08/2019 -----------------------------------------   Blood pressure 133/86, pulse 66, temperature (!) 97.5 F (36.4 C), temperature source Oral, resp. rate 16, height 5\' 4"  (1.626 m), weight 65.3 kg, SpO2 100 %.  The patient had no acute events since last update.  Calm and cooperative at this time.  Disposition is pending per Psychiatry/Behavioral Medicine team recommendations.     Alfred Levins, [place], MD 07/08/19 [phone]

## 2019-07-08 NOTE — ED Notes (Signed)
IVC/ Pending placement at [facility] on today

## 2019-07-08 NOTE — ED Notes (Signed)
Report to include Situation, Background, Assessment, and Recommendations received from [facility]. Patient alert, warm and dry, in no acute distress. Patient denies SI, HI, AVH and pain. Patient made aware of Q15 minute rounds and security cameras for their safety. Patient instructed to come to me with needs or concerns.

## 2019-07-08 NOTE — ED Notes (Signed)
Hourly rounding reveals patient in room. No complaints, stable, in no acute distress. Q15 minute rounds and monitoring via Security Cameras to continue. 

## 2019-07-08 NOTE — ED Notes (Signed)
Pt given breakfast tray and a sprite.

## 2019-07-08 NOTE — ED Notes (Signed)
Hourly rounding reveals patient in rest room. No complaints, stable, in no acute distress. Q15 minute rounds and monitoring via Security Cameras to continue. 

## 2019-07-09 DIAGNOSIS — F29 Unspecified psychosis not due to a substance or known physiological condition: Secondary | ICD-10-CM | POA: Diagnosis not present

## 2019-07-09 NOTE — ED Notes (Signed)
Hourly rounding reveals patient awake in room. No complaints, stable, in no acute distress. Q15 minute rounds and monitoring via Security Cameras to continue. 

## 2019-07-09 NOTE — ED Notes (Signed)
Report to include Situation, Background, Assessment, and Recommendations received from Amy B. RN. Patient alert, warm and dry, in no acute distress. Patient denies SI, HI, AVH and pain. Patient made aware of Q15 minute rounds and security cameras for their safety. Patient instructed to come to me with needs or concerns.

## 2019-07-09 NOTE — ED Notes (Signed)
Hourly rounding reveals patient sleeping in room. No complaints, stable, in no acute distress. Q15 minute rounds and monitoring via Security Cameras to continue. 

## 2019-07-09 NOTE — ED Provider Notes (Signed)
-----------------------------------------   3:07 AM on 07/09/2019 -----------------------------------------   Blood pressure (!) 154/73, pulse 66, temperature 98.4 F (36.9 C), temperature source Oral, resp. rate 18, height 1.626 m (5\' 4" ), weight 65.3 kg, SpO2 100 %.  The patient is calm and cooperative at this time.  There have been no acute events since the last update.  Awaiting disposition plan from Behavioral Medicine and/or Social Work team(s).   Hinda Kehr, MD 07/09/19 [phone]

## 2019-07-09 NOTE — ED Notes (Addendum)
Patient alert in room without complaints.

## 2019-07-09 NOTE — ED Notes (Signed)
Pt given sandwich tray, drink and ice cream

## 2019-07-09 NOTE — ED Notes (Signed)
IVC/ Waiting for response back from [facility] for placement

## 2019-07-09 NOTE — ED Notes (Signed)
IVC/ Waiting for response back from [facility] for placement

## 2019-07-09 NOTE — ED Notes (Signed)
Hourly rounding reveals patient in room. No complaints, stable, in no acute distress. Q15 minute rounds and monitoring via Security Cameras to continue. 

## 2019-07-09 NOTE — Social Work (Signed)
TOCSW:  Vonzella Nipple @ [facility] for bed availability status update (fl2 sent on 07/05/2019).  Left voice mail at [phone] and cell phone [phone].

## 2019-07-10 DIAGNOSIS — F29 Unspecified psychosis not due to a substance or known physiological condition: Secondary | ICD-10-CM | POA: Diagnosis not present

## 2019-07-10 NOTE — ED Notes (Signed)
Hourly rounding reveals patient in room. No complaints, stable, in no acute distress. Q15 minute rounds and monitoring via Security Cameras to continue. 

## 2019-07-10 NOTE — ED Notes (Signed)
Hourly rounding reveals patient awake in room. No complaints, stable, in no acute distress. Q15 minute rounds and monitoring via Security Cameras to continue. 

## 2019-07-10 NOTE — ED Notes (Signed)
Checked on pt.  He was soaked in urine.  Changed pts bedding, blankets, and clean scrubs with wipes to clean up.

## 2019-07-10 NOTE — ED Notes (Signed)
Hourly rounding reveals patient sleeping in room. No complaints, stable, in no acute distress. Q15 minute rounds and monitoring via Security Cameras to continue. 

## 2019-07-10 NOTE — ED Notes (Signed)
Patient scheduled med given. Laying in bed watching tv. Patient 100% of his breakfast.

## 2019-07-10 NOTE — Social Work (Addendum)
TOC SW:  Faxed FL2 to new [employer] number, [phone], Attn: Freda Munro, waiting on response about placement.

## 2019-07-10 NOTE — ED Notes (Addendum)
Report to include Situation, Background, Assessment, and Recommendations received from [facility]. Patient alert and oriented, warm and dry, in no acute distress. Patient denies SI, HI, AVH and pain. Patient made aware of Q15 minute rounds and security cameras for their safety. Patient instructed to come to me with needs or concerns.

## 2019-07-10 NOTE — ED Notes (Signed)
Patient up awake laying in bed. Denies any concerns, patients room reaking of urine, patient asked if he had an accident, reports he wet himself through the night. Writer suggested shower and change of clothing. Patient was provided clean set of close and shower supplies. Bed linen changed and room cleaned up. VSS, safety maintained. Will monitor.

## 2019-07-10 NOTE — ED Provider Notes (Signed)
-----------------------------------------   5:39 AM on 07/10/2019 -----------------------------------------   Blood pressure 139/71, pulse 68, temperature (!) 97.5 F (36.4 C), temperature source Oral, resp. rate 18, height 5\' 4"  (1.626 m), weight 65.3 kg, SpO2 99 %.  The patient is calm and cooperative at this time.  There have been no acute events since the last update.  Awaiting disposition plan from Behavioral Medicine and/or Social Work team(s).   Paulette Blanch, MD 07/10/19 [phone]

## 2019-07-10 NOTE — ED Notes (Signed)
IVC/  PENDING  PLACEMENT 

## 2019-07-11 DIAGNOSIS — F29 Unspecified psychosis not due to a substance or known physiological condition: Secondary | ICD-10-CM | POA: Diagnosis not present

## 2019-07-11 NOTE — ED Notes (Signed)
Pt up to nurses station to throw away his trash and get linens to change bedding

## 2019-07-11 NOTE — ED Notes (Signed)
Hourly rounding reveals patient in room. No complaints, stable, in no acute distress. Q15 minute rounds and monitoring via Security Cameras to continue. 

## 2019-07-11 NOTE — ED Provider Notes (Signed)
-----------------------------------------   7:12 AM on 07/11/2019 -----------------------------------------   Blood pressure 140/82, pulse 87, temperature 98.3 F (36.8 C), temperature source Oral, resp. rate 17, height 5\' 4"  (1.626 m), weight 65.3 kg, SpO2 99 %.  The patient is calm and cooperative at this time.  There have been no acute events since the last update.  Awaiting disposition plan from Behavioral Medicine and/or Social Work team(s).    Merlyn Lot, MD 07/11/19 [phone]

## 2019-07-11 NOTE — Care Management (Addendum)
TOC RN CM: Call to Peter Cox requesting locked unit via [place]. Peter Cox states he will send out information to [facility] facility but concern is that family will need to pay 30 days upfront. Will follow for details and update family.  Incoming call from daughter requesting update regarding placement. Advised patient was currently being reviewed for [facility] bed in [place]. Daughter confirms patient has Medicaid, Medicare and receives a disability check for $770. Daughter states the patients sister has called and offered to take patient home with her until placement is found however daughter advised this is not a safe place and she is requesting this not be a discharge plan.

## 2019-07-11 NOTE — ED Notes (Signed)
IVC/Pending placement 

## 2019-07-11 NOTE — ED Notes (Signed)
Patient incontinent of urine through the night. Showed and changed clothes this morning. Linen changed and house keeping called to mop up his room. Patient reports feeling well, denies any concerns, calm and cooperative. Awaiting lunch. Will monitor.

## 2019-07-11 NOTE — ED Notes (Signed)
Pt. Had soiled bed, pt. Given change of sheets and scrubs.   With minimal assistance pt. Able to change self and bed.

## 2019-07-12 DIAGNOSIS — F29 Unspecified psychosis not due to a substance or known physiological condition: Secondary | ICD-10-CM | POA: Diagnosis not present

## 2019-07-12 NOTE — ED Notes (Signed)
Dietary called dinner order placed.

## 2019-07-12 NOTE — ED Notes (Signed)
Pt. Up using bathroom. 

## 2019-07-12 NOTE — ED Notes (Signed)
When pt. Came out of bathroom, patients pants were wet.  Pt. Given another clean pair of scrubs and underwear.  Pt. Changed in room, soiled clothing removed.

## 2019-07-12 NOTE — ED Notes (Signed)
Pt. Laying in bed watching tv.  Patients linens dry, pt. Encouraged to use bathroom before going to bed.  Pt. Appears to be in a good mood.  Pt. Has no questions or concerns at this time.

## 2019-07-12 NOTE — ED Notes (Signed)
Patient awake early laying in his bed incontinent of urine. Patient asked to strip bed and get ready for shower and change of clothes. Patient compliant with suggestions. Safety maintained. VSS. Will continue to monitor.

## 2019-07-12 NOTE — ED Notes (Signed)
Patient sitting up in bed watching TV. Awaiting lunch.

## 2019-07-12 NOTE — ED Notes (Signed)
IVC/  PENDING  PLACEMENT 

## 2019-07-12 NOTE — Social Work (Signed)
TOC SW: Spoke with Geanie Logan [phone], [facility], they do not accept Medicaid, and will not be able to accept patient.

## 2019-07-12 NOTE — ED Provider Notes (Signed)
-----------------------------------------   2:31 AM on 07/12/2019 -----------------------------------------   Blood pressure (!) 148/80, pulse 70, temperature 98.2 F (36.8 C), temperature source Oral, resp. rate 18, height 5\' 4"  (1.626 m), weight 65.3 kg, SpO2 100 %.  The patient had no acute events since last update.  Calm and cooperative at this time.  Disposition is pending per Psychiatry/Behavioral Medicine team recommendations.     Alfred Levins, [place], MD 07/12/19 [phone]

## 2019-07-12 NOTE — Social Work (Signed)
TOC SW: Spoke with Cecille Rubin (admin.) [facility] [phone] in [place], currently no bed availability, bed availability possible in one week.  This SW will call back in one week.

## 2019-07-13 DIAGNOSIS — F29 Unspecified psychosis not due to a substance or known physiological condition: Secondary | ICD-10-CM | POA: Diagnosis not present

## 2019-07-13 NOTE — ED Notes (Signed)
Pt had episode of incontinence. Pt washed up and given clean scrubs.

## 2019-07-13 NOTE — ED Notes (Signed)
Pt given meal tray.

## 2019-07-13 NOTE — ED Provider Notes (Signed)
-----------------------------------------   7:42 AM on 07/13/2019 -----------------------------------------   Blood pressure (!) 146/76, pulse 75, temperature 98.1 F (36.7 C), temperature source Oral, resp. rate 16, height 1.626 m (5\' 4" ), weight 65.3 kg, SpO2 100 %.  The patient is calm and cooperative at this time.  There have been no acute events since the last update.  Awaiting disposition plan from Behavioral Medicine and/or Social Work team(s).   Hinda Kehr, MD 07/13/19 [phone]

## 2019-07-13 NOTE — ED Notes (Signed)
Pt had episode of incontinence. Pt washed up and given clean scrubs.  New linen placed on bed.

## 2019-07-14 DIAGNOSIS — F29 Unspecified psychosis not due to a substance or known physiological condition: Secondary | ICD-10-CM | POA: Diagnosis not present

## 2019-07-14 NOTE — ED Notes (Signed)
Hourly rounding reveals patient sleeping in room. No complaints, stable, in no acute distress. Q15 minute rounds and monitoring via Security Cameras to continue. 

## 2019-07-14 NOTE — ED Notes (Signed)
Pt up to bathroom. Pt has also urinated in bed. Pt bed cleaned and new chux applied.

## 2019-07-14 NOTE — ED Notes (Signed)
Report to include Situation, Background, Assessment, and Recommendations received from [employer]. Patient alert and oriented, warm and dry, in no acute distress. Patient denies SI, HI, AVH and pain. Patient made aware of Q15 minute rounds and security cameras for their safety. Patient instructed to come to me with needs or concerns.

## 2019-07-14 NOTE — ED Notes (Signed)
Pt given meal tray.

## 2019-07-14 NOTE — ED Provider Notes (Signed)
-----------------------------------------   7:32 AM on 07/14/2019 -----------------------------------------   BP (!) 160/91   Pulse 68   Temp 97.9 F (36.6 C) (Oral)   Resp 18   Ht 1.626 m (5\' 4" )   Wt 65.3 kg   SpO2 100%   BMI 24.72 kg/m   No acute events overnight. Vitals reviewed. Patient remains medically cleared.  Disposition is pending per Psychiatry/Behavioral Medicine team recommendations.    Lavonia Drafts, MD 07/14/19 [phone]

## 2019-07-14 NOTE — ED Notes (Signed)
IVC/Pending Placement 

## 2019-07-15 DIAGNOSIS — F29 Unspecified psychosis not due to a substance or known physiological condition: Secondary | ICD-10-CM | POA: Diagnosis not present

## 2019-07-15 NOTE — ED Notes (Signed)
Hourly rounding reveals patient sleeping in room. No complaints, stable, in no acute distress. Q15 minute rounds and monitoring via Security Cameras to continue. 

## 2019-07-15 NOTE — ED Notes (Signed)
Patient observed lying in bed with eyes closed  Even, unlabored respirations observed   NAD pt appears to be sleeping  I will continue to monitor along with every 15 minute visual observations and ongoing security camera monitoring    

## 2019-07-15 NOTE — ED Notes (Signed)
BEHAVIORAL HEALTH ROUNDING Patient sleeping: No. Patient alert and oriented: yes Behavior appropriate: Yes.  ; If no, describe:  Nutrition and fluids offered: yes Toileting and hygiene offered: Yes  Sitter present: q15 minute observations and security camera monitoring   

## 2019-07-15 NOTE — ED Notes (Signed)

## 2019-07-15 NOTE — ED Notes (Addendum)
BEHAVIORAL HEALTH ROUNDING Patient sleeping: No. Patient alert : yes Behavior appropriate: Yes.  ; If no, describe:  Nutrition and fluids offered: yes Toileting and hygiene offered: Yes  Sitter present: q15 minute observations and security camera monitoring

## 2019-07-15 NOTE — ED Notes (Signed)
BEHAVIORAL HEALTH ROUNDING Patient sleeping: No. Patient alert: yes Behavior appropriate: Yes.  ; If no, describe:  Nutrition and fluids offered: yes Toileting and hygiene offered: Yes  Sitter present: q15 minute observations and security camera monitoring

## 2019-07-15 NOTE — ED Provider Notes (Signed)
-----------------------------------------   1:09 PM on 07/15/2019 -----------------------------------------  Blood pressure (!) 159/86, pulse 77, temperature 98 F (36.7 C), temperature source Oral, resp. rate 17, height 5\' 4"  (1.626 m), weight 65.3 kg, SpO2 98 %.  The patient is calm and cooperative at this time.  There have been no acute events since the last update.  Awaiting disposition plan from Behavioral Medicine team.   Blake Divine, MD 07/15/19 [phone]

## 2019-07-15 NOTE — ED Notes (Signed)
Report to include Situation, Background, Assessment, and Recommendations received from Amy, RN. Patient alert and oriented, warm and dry, in no acute distress. Patient denies SI, HI, AVH and pain. Patient made aware of Q15 minute rounds and security cameras for their safety. Patient instructed to come to me with needs or concerns.

## 2019-07-15 NOTE — ED Notes (Signed)
BEHAVIORAL HEALTH ROUNDING Patient sleeping: No. Patient alert : yes Behavior appropriate: Yes.  ; If no, describe:  Nutrition and fluids offered: yes Toileting and hygiene offered: Yes  Sitter present: q15 minute observations and security camera monitoring

## 2019-07-15 NOTE — ED Notes (Signed)
ED BHU Is the patient under IVC or is there intent for IVC: Yes.   Is the patient medically cleared: Yes.   Is there vacancy in the ED BHU: Yes.   Is the population mix appropriate for patient: Yes.   Is the patient awaiting placement in inpatient or outpatient setting: Yes.  Social work is seeking placement for him   Has the patient had a psychiatric consult: Yes.  He has been cleared by psych   Survey of unit performed for contraband, proper placement and condition of furniture, tampering with fixtures in bathroom, shower, and each patient room: Yes.  ; Findings:  APPEARANCE/BEHAVIOR Calm and cooperative NEURO ASSESSMENT Orientation: oriented to self  Reoriented to time, place and situation  Denies pain Hallucinations: No.None noted (Hallucinations) Speech: Normal Gait: normal RESPIRATORY ASSESSMENT Even  Unlabored respirations  CARDIOVASCULAR ASSESSMENT Pulses equal   regular rate  Skin warm and dry   GASTROINTESTINAL ASSESSMENT no GI complaint EXTREMITIES Full ROM  PLAN OF CARE Provide calm/safe environment. Vital signs assessed twice daily. ED BHU Assessment once each 12-hour shift.  Assure the ED provider has rounded once each shift. Provide and encourage hygiene. Provide redirection as needed. Assess for escalating behavior; address immediately and inform ED provider.  Assess family dynamic and appropriateness for visitation as needed: Yes.  ; If necessary, describe findings:  Educate the patient/family about BHU procedures/visitation: Yes.  ; If necessary, describe findings:

## 2019-07-16 DIAGNOSIS — F29 Unspecified psychosis not due to a substance or known physiological condition: Secondary | ICD-10-CM | POA: Diagnosis not present

## 2019-07-16 NOTE — ED Notes (Signed)
Hourly rounding reveals patient sleeping in room. No complaints, stable, in no acute distress. Q15 minute rounds and monitoring via Security Cameras to continue. 

## 2019-07-16 NOTE — ED Notes (Signed)
Hourly rounding reveals patient in room. No complaints, stable, in no acute distress. Q15 minute rounds and monitoring via Security Cameras to continue. 

## 2019-07-16 NOTE — ED Notes (Signed)
IVC/Pending Placement 

## 2019-07-16 NOTE — ED Notes (Signed)
Pt talking to family at this time.

## 2019-07-16 NOTE — ED Notes (Signed)
Pt given lunch tray.

## 2019-07-16 NOTE — ED Notes (Addendum)
Pt cleaned up and bed linens changed with assistance from [facility]. Pt washed with wet cloths and dried.  Pt placed in clean shirt and pants. Brief placed on pt. Pt set up food tray and eating supper at this time.

## 2019-07-16 NOTE — ED Provider Notes (Signed)
-----------------------------------------   6:10 AM on 07/16/2019 -----------------------------------------   Blood pressure (!) 153/82, pulse 78, temperature 98 F (36.7 C), temperature source Oral, resp. rate 16, height 5\' 4"  (1.626 m), weight 65.3 kg, SpO2 98 %.  The patient is sleeping at this time.  There have been no acute events since the last update.  Awaiting disposition plan from Behavioral Medicine and/or Social Work team(s).   Paulette Blanch, MD 07/16/19 [phone]

## 2019-07-16 NOTE — ED Notes (Signed)
Report to include Situation, Background, Assessment, and Recommendations received from  Samantha RN. Patient alert and oriented, warm and dry, in no acute distress. Patient denies SI, HI, AVH and pain. Patient made aware of Q15 minute rounds and security cameras for their safety. Patient instructed to come to me with needs or concerns.  

## 2019-07-16 NOTE — ED Notes (Signed)
IVC/  PENDING  PLACEMENT 

## 2019-07-17 DIAGNOSIS — F29 Unspecified psychosis not due to a substance or known physiological condition: Secondary | ICD-10-CM | POA: Diagnosis not present

## 2019-07-17 NOTE — ED Notes (Signed)
Hourly rounding reveals patient in room. No complaints, stable, in no acute distress. Q15 minute rounds and monitoring via Security Cameras to continue. 

## 2019-07-17 NOTE — ED Notes (Signed)
BEHAVIORAL HEALTH ROUNDING Patient sleeping: No. Patient alert : yes Behavior appropriate: Yes.  ; If no, describe:  Nutrition and fluids offered: yes Toileting and hygiene offered: Yes  Sitter present: q15 minute observations and security camera monitoring

## 2019-07-17 NOTE — ED Provider Notes (Signed)
-----------------------------------------   4:02 AM on 07/17/2019 -----------------------------------------   Blood pressure (!) 113/57, pulse 60, temperature 97.9 F (36.6 C), temperature source Oral, resp. rate (!) 22, height 5\' 4"  (1.626 m), weight 65.3 kg, SpO2 98 %.  The patient had no acute events since last update.  Calm and cooperative at this time.  Disposition is pending per Psychiatry/Behavioral Medicine team recommendations.     Alfred Levins, [place], MD 07/17/19 [phone]

## 2019-07-17 NOTE — ED Notes (Signed)
BEHAVIORAL HEALTH ROUNDING Patient sleeping: No. Patient alert: yes Behavior appropriate: Yes.  ; If no, describe:  Nutrition and fluids offered: yes Toileting and hygiene offered: Yes  Sitter present: q15 minute observations and security camera monitoring

## 2019-07-17 NOTE — ED Notes (Signed)
Hourly rounding reveals patient sleeping in room. No complaints, stable, in no acute distress. Q15 minute rounds and monitoring via Security Cameras to continue. 

## 2019-07-17 NOTE — ED Notes (Signed)
Patient observed lying in bed with eyes closed  Even, unlabored respirations observed   NAD pt appears to be sleeping  I will continue to monitor along with every 15 minute visual observations and ongoing security camera monitoring    

## 2019-07-17 NOTE — ED Notes (Addendum)
BEHAVIORAL HEALTH ROUNDING Patient sleeping: No. Patient alert : yes Behavior appropriate: Yes.  ; If no, describe:  Nutrition and fluids offered: yes Toileting and hygiene offered: Yes  Sitter present: q15 minute observations and security camera monitoring    ENVIRONMENTAL ASSESSMENT Potentially harmful objects out of patient reach: Yes.   Personal belongings secured: Yes.   Patient dressed in hospital provided attire only: Yes.   Plastic bags out of patient reach: Yes.   Patient care equipment (cords, cables, call bells, lines, and drains) shortened, removed, or accounted for: Yes.   Equipment and supplies removed from bottom of stretcher: Yes.   Potentially toxic materials out of patient reach: Yes.   Sharps container removed or out of patient reach: Yes.

## 2019-07-17 NOTE — ED Notes (Signed)
Report to include Situation, Background, Assessment, and Recommendations received from [employer]. Patient alert, warm and dry, in no acute distress. Patient denies SI, HI, AVH and pain. Patient made aware of Q15 minute rounds and security cameras for their safety. Patient instructed to come to me with needs or concerns.

## 2019-07-17 NOTE — ED Notes (Signed)
ED BHU  Is the patient under IVC or is there intent for IVC: Yes.   Is the patient medically cleared: Yes.   Is there vacancy in the ED BHU: Yes.   Is the population mix appropriate for patient: Yes.   Is the patient awaiting placement in inpatient or outpatient setting: Yes.  Social work is finding him a safe place to live  Has the patient had a psychiatric consult: Yes.   Survey of unit performed for contraband, proper placement and condition of furniture, tampering with fixtures in bathroom, shower, and each patient room: Yes.  ; Findings:  APPEARANCE/BEHAVIOR Calm and cooperative NEURO ASSESSMENT Orientation: oriented x self  Reoriented to time, place and situation   Denies pain Hallucinations: No.None noted (Hallucinations) Speech: Normal Gait: normal RESPIRATORY ASSESSMENT Even  Unlabored respirations  CARDIOVASCULAR ASSESSMENT Pulses equal   regular rate  Skin warm and dry   GASTROINTESTINAL ASSESSMENT no GI complaint EXTREMITIES Full ROM  PLAN OF CARE Provide calm/safe environment. Vital signs assessed twice daily. ED BHU Assessment once each 12-hour shift.  Assure the ED provider has rounded once each shift. Provide and encourage hygiene. Provide redirection as needed. Assess for escalating behavior; address immediately and inform ED provider.  Assess family dynamic and appropriateness for visitation as needed: Yes.  ; If necessary, describe findings:  Educate the patient/family about BHU procedures/visitation: Yes.  ; If necessary, describe findings:

## 2019-07-18 DIAGNOSIS — F29 Unspecified psychosis not due to a substance or known physiological condition: Secondary | ICD-10-CM | POA: Diagnosis not present

## 2019-07-18 NOTE — ED Notes (Signed)
Assumed care of patient as per prior nurse patient slept through the night. Will re-assess upon awakening. Safety watch in process. Will continue to monitor.

## 2019-07-18 NOTE — ED Notes (Signed)
dietary calked to place dinner order, reported they already have a ticket for their dinner.

## 2019-07-18 NOTE — ED Notes (Signed)
Hourly rounding reveals patient sleeping in room. No complaints, stable, in no acute distress. Q15 minute rounds and monitoring via Security Cameras to continue. 

## 2019-07-18 NOTE — ED Notes (Signed)
IVC/Pending Placement 

## 2019-07-18 NOTE — ED Notes (Signed)
Hourly rounding reveals patient in room. No complaints, stable, in no acute distress. Q15 minute rounds and monitoring via Security Cameras to continue. 

## 2019-07-18 NOTE — ED Notes (Signed)
Writer went over [employer] and ordered a hot lunch for patient

## 2019-07-18 NOTE — ED Provider Notes (Signed)
-----------------------------------------   4:35 AM on 07/18/2019 -----------------------------------------   Blood pressure 137/71, pulse (!) 16, temperature 98.1 F (36.7 C), temperature source Oral, resp. rate 18, height 1.626 m (5\' 4" ), weight 65.3 kg, SpO2 100 %.  The patient is calm and cooperative at this time.  There have been no acute events since the last update.  Awaiting disposition plan from Behavioral Medicine and/or Social Work team(s).   Hinda Kehr, MD 07/18/19 [phone]

## 2019-07-18 NOTE — Social Work (Signed)
TOC SW:  Waiting for facility reply for bed availability.

## 2019-07-19 DIAGNOSIS — F29 Unspecified psychosis not due to a substance or known physiological condition: Secondary | ICD-10-CM | POA: Diagnosis not present

## 2019-07-19 NOTE — ED Notes (Signed)
Patient out of room to shower. Linen changed, meds given as scheduled.

## 2019-07-19 NOTE — Care Management (Signed)
TOC RN CM: Faxed Fl2 and notes to [facility] to [phone]. Fax failed and sent secure to email @ [email or web address] for review.

## 2019-07-19 NOTE — ED Provider Notes (Signed)
1:23 AM 1/29  Blood pressure 138/89, pulse (!) 57, temperature 98.3 F (36.8 C), temperature source Oral, resp. rate 16, height 5\' 4"  (1.626 m), weight 65.3 kg, SpO2 97 %.  The patient is calm and cooperative at this time.  There have been no acute events since the last update.  Awaiting disposition plan from Behavioral Medicine team.   Vanessa Bayside, MD 07/19/19 [phone]

## 2019-07-19 NOTE — ED Notes (Signed)
Assumed care of patient this morning as per prior nurse, patient incontinent of urine twice through the night. Peri care and clean clothing provided. Otherwise no concerns for harm to self or others. Patient presently up early laying in bed watching TV, will shower this morning. Safety maintained. Will monitor.

## 2019-07-19 NOTE — ED Notes (Signed)
Gave pt snack tray and drink. Encouraged pt to goo use the toilet and he complied.AS

## 2019-07-20 DIAGNOSIS — F29 Unspecified psychosis not due to a substance or known physiological condition: Secondary | ICD-10-CM | POA: Diagnosis not present

## 2019-07-20 NOTE — ED Notes (Signed)
Pt given dinner tray.  LW edt

## 2019-07-20 NOTE — ED Notes (Signed)
ED BHU  Is the patient under IVC or is there intent for IVC: Yes.   Is the patient medically cleared: Yes.   Is there vacancy in the ED BHU: Yes.   Is the population mix appropriate for patient: Yes.   Is the patient awaiting placement in inpatient or outpatient setting: Yes.  He is awaiting social work to find him a safe place to live  Has the patient had a psychiatric consult: Yes.   He has been cleared by psychiatry  Survey of unit performed for contraband, proper placement and condition of furniture, tampering with fixtures in bathroom, shower, and each patient room: Yes.  ; Findings:  APPEARANCE/BEHAVIOR Calm and cooperative NEURO ASSESSMENT Orientation: oriented x self  Reoriented to time, place and situation  Denies pain Hallucinations: No.None noted  denies (Hallucinations) Speech: Normal  - slow to respond at times Gait: normal RESPIRATORY ASSESSMENT Even  Unlabored respirations  CARDIOVASCULAR ASSESSMENT Pulses equal   regular rate  Skin warm and dry   GASTROINTESTINAL ASSESSMENT no GI complaint EXTREMITIES Full ROM  PLAN OF CARE Provide calm/safe environment. Vital signs assessed twice daily. ED BHU Assessment once each 12-hour shift.  Assure the ED provider has rounded once each shift. Provide and encourage hygiene. Provide redirection as needed. Assess for escalating behavior; address immediately and inform ED provider.  Assess family dynamic and appropriateness for visitation as needed: Yes.  ; If necessary, describe findings:  Educate the patient/family about BHU procedures/visitation: Yes.  ; If necessary, describe findings:

## 2019-07-20 NOTE — ED Notes (Signed)
BEHAVIORAL HEALTH ROUNDING Patient sleeping: Yes.   Patient alert and oriented: eyes closed  Appears asleep Behavior appropriate: Yes.  ; If no, describe:  Nutrition and fluids offered: Yes  Toileting and hygiene offered: sleeping Sitter present: q 15 minute observations and security camera monitoring Security present: yes

## 2019-07-20 NOTE — ED Provider Notes (Signed)
-----------------------------------------   6:15 AM on 07/20/2019 -----------------------------------------   Blood pressure 139/75, pulse 98, temperature 98.2 F (36.8 C), temperature source Oral, resp. rate 18, height 5\' 4"  (1.626 m), weight 65.3 kg, SpO2 100 %.  The patient is calm and cooperative at this time.  There have been no acute events since the last update.  Awaiting disposition plan from Behavioral Medicine and/or Social Work team(s).   Paulette Blanch, MD 07/20/19 [phone]

## 2019-07-20 NOTE — ED Notes (Signed)
BEHAVIORAL HEALTH ROUNDING Patient sleeping: No. Patient alert: yes Behavior appropriate: Yes.  ; If no, describe:  Nutrition and fluids offered: yes Toileting and hygiene offered: Yes  Sitter present: q15 minute observations and security camera monitoring security present: Yes

## 2019-07-20 NOTE — ED Notes (Signed)
BEHAVIORAL HEALTH ROUNDING Patient sleeping: No. Patient alert  yes Behavior appropriate: Yes.  ; If no, describe:  Nutrition and fluids offered: yes Toileting and hygiene offered: Yes  Sitter present: q15 minute observations and security camera monitoring  security present: Yes

## 2019-07-20 NOTE — ED Notes (Signed)
BEHAVIORAL HEALTH ROUNDING Patient sleeping: Yes.   Patient alert and oriented: eyes closed  Appears asleep Behavior appropriate: Yes.  ; If no, describe:  Nutrition and fluids offered: Yes  Toileting and hygiene offered: sleeping Sitter present: q 15 minute observations and security camera monitoring Security present: yes    ENVIRONMENTAL ASSESSMENT Potentially harmful objects out of patient reach: Yes.   Personal belongings secured: Yes.   Patient dressed in hospital provided attire only: Yes.   Plastic bags out of patient reach: Yes.   Patient care equipment (cords, cables, call bells, lines, and drains) shortened, removed, or accounted for: Yes.   Equipment and supplies removed from bottom of stretcher: Yes.   Potentially toxic materials out of patient reach: Yes.   Sharps container removed or out of patient reach: Yes.

## 2019-07-20 NOTE — ED Notes (Signed)
BEHAVIORAL HEALTH ROUNDING Patient sleeping: No. Patient alert : yes Behavior appropriate: Yes.  ; If no, describe:  Nutrition and fluids offered: yes Toileting and hygiene offered: Yes  Sitter present: q15 minute observations and security camera monitoring

## 2019-07-20 NOTE — ED Notes (Signed)
Patient observed lying in bed with eyes closed  Even, unlabored respirations observed   NAD pt appears to be sleeping  I will continue to monitor along with every 15 minute visual observations and ongoing security camera monitoring    

## 2019-07-20 NOTE — ED Notes (Signed)
IVC pending placement 

## 2019-07-21 DIAGNOSIS — F29 Unspecified psychosis not due to a substance or known physiological condition: Secondary | ICD-10-CM | POA: Diagnosis not present

## 2019-07-21 NOTE — ED Provider Notes (Signed)
-----------------------------------------   6:30 AM on 07/21/2019 -----------------------------------------   Blood pressure (!) 152/74, pulse 66, temperature (!) 97.5 F (36.4 C), temperature source Oral, resp. rate 16, height 5\' 4"  (1.626 m), weight 65.3 kg, SpO2 100 %.  The patient is calm and cooperative at this time.  There have been no acute events since the last update.  Awaiting disposition plan from Behavioral Medicine and/or Social Work team(s).   Paulette Blanch, MD 07/21/19 0630

## 2019-07-21 NOTE — ED Notes (Signed)
Pt given meal tray.

## 2019-07-21 NOTE — ED Notes (Signed)
Patient observed lying in bed with eyes closed  Even, unlabored respirations observed   NAD pt appears to be sleeping  I will continue to monitor along with every 15 minute visual observations and ongoing security camera monitoring    

## 2019-07-21 NOTE — ED Notes (Signed)
Pt provided sandwich tray, drink and icecream

## 2019-07-21 NOTE — ED Notes (Signed)
Pt bed saturated with urine. Bed cleaned with sani wipes and clean linen placed on bed. Pt in shower at this time.

## 2019-07-22 ENCOUNTER — Emergency Department: Payer: Medicare HMO

## 2019-07-22 DIAGNOSIS — F29 Unspecified psychosis not due to a substance or known physiological condition: Secondary | ICD-10-CM | POA: Diagnosis not present

## 2019-07-22 MED ORDER — ACETAMINOPHEN 500 MG PO TABS
1000.0000 mg | ORAL_TABLET | Freq: Once | ORAL | Status: AC
  Administered 2019-07-22: 1000 mg via ORAL
  Filled 2019-07-22: qty 2

## 2019-07-22 NOTE — ED Notes (Signed)
Nurse heard loud thug in the shower and we took keys to open the door, Peter Cox was lying in the floor, head up looking at staff, denies hitting His head, noted lump to come up on left side and left knee, Patient does not talk often, just agreed that he slipped down, He had has grip socks on, we helped him up to a chair, per security, cna, and nurse, nurse notified MD and she ordered x-rays to effected areas, Patient without any cognitive, mental changes, remains at His base line, v/s obtained, will continue to monitor.

## 2019-07-22 NOTE — ED Notes (Signed)
Hourly rounding reveals patient sleeping in room. No complaints, stable, in no acute distress. Q15 minute rounds and monitoring via Security Cameras to continue. 

## 2019-07-22 NOTE — Care Management (Addendum)
TOC RN CM: Contacted Cassandra @ [employer] who states they are not able to accomodate patient at this time.   TOC RN CM called to [facility] [phone], requested Fl2 be faxed to [phone]. RN faxed over information for review.   TOC RN CM faxed Fl2 to [place] @ [phone] for review.   TOC RN CM faxed Fl2 to [facility] @ [phone] for review.

## 2019-07-22 NOTE — ED Notes (Signed)
Report to include Situation, Background, Assessment, and Recommendations received from [facility]. Patient alert, warm and dry, in no acute distress. Patient denies SI, HI, AVH and pain. Patient made aware of Q15 minute rounds and security cameras for their safety. Patient instructed to come to me with needs or concerns.

## 2019-07-22 NOTE — ED Provider Notes (Signed)
Patient had a fall.  Patient states he slipped.  Reevaluated patient he is got some tenderness on his left knee.  Report of some left chest wall tenderness but none at this time.  Patient dates that he did not hit his head did not lose consciousness.  Notes cervical spine tenderness.  Patient is acting his normal baseline self.  He is not on any blood thinners.  X-ray of the knee was negative and x-ray of the chest was negative.  Will give some Tylenol for the tenderness.    Vanessa [place], MD 07/22/19 1344

## 2019-07-22 NOTE — ED Provider Notes (Signed)
-----------------------------------------   5:03 AM on 07/22/2019 -----------------------------------------   Blood pressure (!) 151/81, pulse 63, temperature 98.5 F (36.9 C), temperature source Oral, resp. rate 18, height 1.626 m (5\' 4" ), weight 65.3 kg, SpO2 100 %.  The patient is calm and cooperative at this time.  There have been no acute events since the last update.  Awaiting disposition plan from Behavioral Medicine and/or Social Work team(s).   Hinda Kehr, MD 07/22/19 [phone]

## 2019-07-22 NOTE — ED Notes (Signed)
Patient is being transferred to x-ray with CNA and security due to him being IVC'd.

## 2019-07-22 NOTE — ED Notes (Signed)
Dr. Jari Pigg went in to assess the Patient, states no fractures noted from x-ray, patient only complained of left knee pain, order for tylenol for pain.

## 2019-07-22 NOTE — ED Notes (Signed)
Patient ate 100% of supper and beverage, no signs of distress.  

## 2019-07-22 NOTE — ED Notes (Signed)
Hourly rounding reveals patient in room. No complaints, stable, in no acute distress. Q15 minute rounds and monitoring via Security Cameras to continue. 

## 2019-07-23 DIAGNOSIS — F29 Unspecified psychosis not due to a substance or known physiological condition: Secondary | ICD-10-CM | POA: Diagnosis not present

## 2019-07-23 LAB — CBC
HCT: 36.8 % — ABNORMAL LOW (ref 39.0–52.0)
Hemoglobin: 12.3 g/dL — ABNORMAL LOW (ref 13.0–17.0)
MCH: 30.8 pg (ref 26.0–34.0)
MCHC: 33.4 g/dL (ref 30.0–36.0)
MCV: 92.2 fL (ref 80.0–100.0)
Platelets: 123 10*3/uL — ABNORMAL LOW (ref 150–400)
RBC: 3.99 MIL/uL — ABNORMAL LOW (ref 4.22–5.81)
RDW: 15.4 % (ref 11.5–15.5)
WBC: 5.9 10*3/uL (ref 4.0–10.5)
nRBC: 0 % (ref 0.0–0.2)

## 2019-07-23 LAB — COMPREHENSIVE METABOLIC PANEL
ALT: 30 U/L (ref 0–44)
AST: 23 U/L (ref 15–41)
Albumin: 3.8 g/dL (ref 3.5–5.0)
Alkaline Phosphatase: 65 U/L (ref 38–126)
Anion gap: 9 (ref 5–15)
BUN: 48 mg/dL — ABNORMAL HIGH (ref 6–20)
CO2: 30 mmol/L (ref 22–32)
Calcium: 9.2 mg/dL (ref 8.9–10.3)
Chloride: 103 mmol/L (ref 98–111)
Creatinine, Ser: 3.12 mg/dL — ABNORMAL HIGH (ref 0.61–1.24)
GFR calc Af Amer: 24 mL/min — ABNORMAL LOW (ref >60)
GFR calc non Af Amer: 21 mL/min — ABNORMAL LOW (ref >60)
Glucose, Bld: 92 mg/dL (ref 70–99)
Potassium: 4.2 mmol/L (ref 3.5–5.1)
Sodium: 142 mmol/L (ref 135–145)
Total Bilirubin: 0.6 mg/dL (ref 0.3–1.2)
Total Protein: 7.4 g/dL (ref 6.5–8.1)

## 2019-07-23 NOTE — ED Notes (Signed)
Hourly rounding reveals patient in rest room. No complaints, stable, in no acute distress. Q15 minute rounds and monitoring via Security Cameras to continue. 

## 2019-07-23 NOTE — Evaluation (Addendum)
Physical Therapy Evaluation Patient Details Name: Peter Cox MRN: [id number] DOB: 05-May-1961 Today's Date: 07/23/2019   History of Present Illness  Pt is a 59 y.o. male presenting to hospital 06/20/19: "Patient presents with hallucinations, possible delusional disorder" resulting in safety concerns at home and family unable to manage care at home.  Per chart pt s/p fall 2/1 (slipped in shower): negative acute L sided rib fx, pleural effusion, or pneumothorax; also no definitive evidence of acute fx or dislocation of L knee (prominent indentation of one of lateral femoral condyles anteriorly may be related to previous injury without associated joint effusion to suggest acute abnormality).  Per chart pt with rapid decline last 6 months (dementia).  PMH includes polysubstance abuse, htn, h/o stroke, HIV infection, seizures, neuromuscular disorder, dementia associated with underlying disease with behavioral disturbance, closed reduction patellar R knee.  Clinical Impression  Prior to hospital admission, pt was independent with ambulation.  Currently pt is modified independent with bed mobility; independent with transfers; and CGA with ambulation (pt demonstrating antalgic gait ambulation without UE support walking but improved gait quality and decreased L knee pain noted with ambulation using RW).  Pain to palpation noted to anterior L knee area and decreased L knee flexion ROM noted d/t L anterior knee pain.  No loss of balance noted during sessions activities.  Pt would benefit from skilled PT to address noted impairments and functional limitations (see below for any additional details).  Upon hospital discharge, pt would benefit from OP PT.    Follow Up Recommendations Outpatient PT    Equipment Recommendations  Rolling walker with 5" wheels    Recommendations for Other Services       Precautions / Restrictions Precautions Precautions: Fall Restrictions Weight Bearing Restrictions: No       Mobility  Bed Mobility Overal bed mobility: Modified Independent             General bed mobility comments: Semi-supine to/from sit without any noted difficulty  Transfers Overall transfer level: Independent Equipment used: None             General transfer comment: steady safe transfers noted (although pt tending to stand with more weight through R LE)  Ambulation/Gait Ambulation/Gait assistance: Min guard Gait Distance (Feet): (40 feet with RW; 10 feet no AD) Assistive device: None;Rolling walker (2 wheeled)   Gait velocity: decreased (improved with RW use)   General Gait Details: Ambulating without AD/UE support pt appearing antalgic with decreased stance time L LE, decreased R LE step length, decreased cadence.  Ambulating with RW pt's gait improved with mild antalgic gait/decreased stance time L LE and improved R LE step length; steady with RW use.  Stairs            Wheelchair Mobility    Modified Rankin (Stroke Patients Only)       Balance Overall balance assessment: Needs assistance Sitting-balance support: No upper extremity supported;Feet supported Sitting balance-Leahy Scale: Normal Sitting balance - Comments: steady sitting reaching outside BOS   Standing balance support: No upper extremity supported;During functional activity Standing balance-Leahy Scale: Good Standing balance comment: no loss of balance noted with ambulation (without or with AD)                             Pertinent Vitals/Pain Pain Assessment: Faces Faces Pain Scale: Hurts a little bit Pain Location: L knee Pain Descriptors / Indicators: Sore;Tender Pain Intervention(s): Limited activity within patient's  tolerance;Monitored during session;Repositioned    Home Living                   Additional Comments: Per chart pt was living with his daughter but unable to take pt back d/t safety concerns (per chart pt with h/o wandering middle of night prior  to ED admit)    Prior Function Level of Independence: Independent               Hand Dominance        Extremity/Trunk Assessment   Upper Extremity Assessment Upper Extremity Assessment: Overall WFL for tasks assessed    Lower Extremity Assessment Lower Extremity Assessment: LLE deficits/detail(R LE WFL) LLE Deficits / Details: good L quad set; hip flexion at least 3/5 AROM; knee flexion AROM limited d/t anterior L knee pain (grossly 70 degrees AROM); neutral knee extension; DF/PF AROM at least 3/5 LLE: Unable to fully assess due to pain    Cervical / Trunk Assessment Cervical / Trunk Assessment: Normal  Communication   Communication: No difficulties  Cognition Arousal/Alertness: Awake/alert Behavior During Therapy: WFL for tasks assessed/performed Overall Cognitive Status: Within Functional Limits for tasks assessed                                 General Comments: Oriented to person and place (therapist did not ask situation or time)      General Comments      Exercises General Exercises - Lower Extremity Long Arc Quad: AROM;Strengthening;Left;10 reps;Seated   Assessment/Plan    PT Assessment Patient needs continued PT services  PT Problem List Decreased strength;Decreased activity tolerance;Decreased range of motion;Decreased mobility;Decreased knowledge of use of DME;Pain       PT Treatment Interventions DME instruction;Gait training;Functional mobility training;Therapeutic activities;Therapeutic exercise;Balance training;Patient/family education    PT Goals (Current goals can be found in the Care Plan section)  Acute Rehab PT Goals Patient Stated Goal: to have less L knee pain PT Goal Formulation: With patient Time For Goal Achievement: 08/06/19 Potential to Achieve Goals: Good    Frequency Min 2X/week   Barriers to discharge        Co-evaluation               AM-PAC PT "6 Clicks" Mobility  Outcome Measure Help needed  turning from your back to your side while in a flat bed without using bedrails?: None Help needed moving from lying on your back to sitting on the side of a flat bed without using bedrails?: None Help needed moving to and from a bed to a chair (including a wheelchair)?: None Help needed standing up from a chair using your arms (e.g., wheelchair or bedside chair)?: None Help needed to walk in hospital room?: A Little Help needed climbing 3-5 steps with a railing? : A Little 6 Click Score: 22    End of Session   Activity Tolerance: Patient tolerated treatment well Patient left: in bed Nurse Communication: Mobility status;Precautions(use of RW improved ambulation and pain) PT Visit Diagnosis: Other abnormalities of gait and mobility (R26.89);History of falling (Z91.81);Difficulty in walking, not elsewhere classified (R26.2);Pain Pain - Right/Left: Left Pain - part of body: Knee    Time: 1440-1452 PT Time Calculation (min) (ACUTE ONLY): 12 min   Charges:   PT Evaluation $PT Eval Low Complexity: 1 Low         Moani Weipert, PT 07/23/19, 4:36 PM

## 2019-07-23 NOTE — ED Notes (Signed)
Report to include Situation, Background, Assessment, and Recommendations received from [facility]. Patient alert, warm and dry, in no acute distress. Patient denies SI, HI, AVH and pain. Patient made aware of Q15 minute rounds and security cameras for their safety. Patient instructed to come to me with needs or concerns.

## 2019-07-23 NOTE — ED Notes (Signed)
Patient evaluated by PT, as per PT patient may benefit from having a walker.

## 2019-07-23 NOTE — ED Notes (Signed)
Labs drawn and sent -

## 2019-07-23 NOTE — ED Notes (Signed)
Hourly rounding reveals patient awake in room. No complaints, stable, in no acute distress. Q15 minute rounds and monitoring via Security Cameras to continue. 

## 2019-07-23 NOTE — ED Notes (Signed)
Hourly rounding reveals patient sleeping in room. No complaints, stable, in no acute distress. Q15 minute rounds and monitoring via Security Cameras to continue. 

## 2019-07-23 NOTE — ED Notes (Signed)
Patient incontinent, bed linen changed, patient refused shower, peri care completed change of clothes provided.

## 2019-07-23 NOTE — ED Notes (Signed)
Assumed care of patient. Patient up early and laying in bed watching tv. VSS this morning. Patient incontinent throughout the night.  Lean dry Linen and clothes provided. Safety maintained. Will monitor.

## 2019-07-23 NOTE — ED Notes (Signed)
Hourly rounding reveals patient in room. No complaints, stable, in no acute distress. Q15 minute rounds and monitoring via Security Cameras to continue. 

## 2019-07-23 NOTE — ED Notes (Signed)
Pt has been here since December decline in mobility, spoke with Dr. Corky Downs EDP in reference to MD re-eval , PT eval .  (pt incontinent of urine and stool). New orders received

## 2019-07-23 NOTE — ED Provider Notes (Signed)
-----------------------------------------   4:54 AM on 07/23/2019 -----------------------------------------   Blood pressure 123/75, pulse 95, temperature 98 F (36.7 C), temperature source Oral, resp. rate 16, height 5\' 4"  (1.626 m), weight 65.3 kg, SpO2 100 %.  The patient had no acute events since last update.  Calm and cooperative at this time.  Disposition is pending per Psychiatry/Behavioral Medicine team recommendations.     Alfred Levins, [place], MD 07/23/19 [phone]

## 2019-07-24 DIAGNOSIS — F29 Unspecified psychosis not due to a substance or known physiological condition: Secondary | ICD-10-CM | POA: Diagnosis not present

## 2019-07-24 NOTE — ED Notes (Signed)
Hourly rounding reveals patient sleeping in room. No complaints, stable, in no acute distress. Q15 minute rounds and monitoring via Security Cameras to continue. 

## 2019-07-24 NOTE — ED Notes (Signed)
Patient in shower asked to help patient refused ,but keep check on patient at this time.

## 2019-07-24 NOTE — ED Notes (Signed)
Pt asked for a drink and it was noticed that he was wet. This NT gave pt a new brief and clean scrubs for him to change in to. His bed was also changed.

## 2019-07-24 NOTE — ED Notes (Signed)
Patient ate 100% of his breakfast. Showered and changed clothes this morning. Patient incontinent of urine clean diaper provided.

## 2019-07-24 NOTE — ED Notes (Signed)
Pt. Laying on bed sleeping with light off.  Patients clothing appears dry at this time.

## 2019-07-24 NOTE — ED Notes (Signed)
Patient had PT today, as per tech patient did well with walker but continues to c/o of discomfort to right knee.

## 2019-07-24 NOTE — ED Notes (Addendum)
Sister called  Peter Cox # [phone]) requesting update and information.sister was advise to leave number and social worker will contact her with further plan of care.

## 2019-07-24 NOTE — ED Notes (Addendum)
Helped pt to bathroom, gave pt a new brief to put on. Bed is dry.

## 2019-07-24 NOTE — ED Notes (Signed)
IVC Renewed/Still Pending Placement

## 2019-07-24 NOTE — ED Notes (Signed)
assumed care of patient patient reports slept all night, but usually an early riser. Patient laying in bed with eyes closed. Safety maintained. Will continue to monitor.

## 2019-07-24 NOTE — Care Management (Signed)
TOC RN CM called to [facility] [phone], requested Fl2 be faxed to [phone]. RN faxed over information for review. **States she did not receive the Fl2 and requested information be refaxed.   TOC RN CM call to [place] @ [phone] for follow up on Fl2-states Blondie is who makes that decision and she is not available at this time. Requested callback later today.    TOC RN CM: Call to [facility] @ [phone] for follow up on Fl2 review. Left contact info for callback.

## 2019-07-24 NOTE — Progress Notes (Addendum)
Physical Therapy Treatment Patient Details Name: Peter Cox MRN: [id number] DOB: [birth year] Today's Date: 07/24/2019    History of Present Illness Pt is a 59 y.o. male presenting to hospital 06/20/19: "Patient presents with hallucinations, possible delusional disorder" resulting in safety concerns at home and family unable to manage care at home. Per chart pt s/p fall 2/1 (slipped in shower): negative acute L sided rib fx, pleural effusion, or pneumothorax; also no definitive evidence of acute fx or dislocation of L knee (prominent indentation of one of lateral femoral condyles anteriorly may be related to previous injury without associated joint effusion to suggest acute abnormality). Per chart pt with rapid decline last 6 months (dementia). PMH includes polysubstance abuse, htn, h/o stroke, HIV infection, seizures, neuromuscular disorder, dementia associated with underlying disease with behavioral disturbance, closed reduction patellar R knee.   PT Comments    Pt is a 59 y.o. male presenting to hospital 06/20/19: "Patient presents with hallucinations, possible delusional disorder" resulting in safety concerns at home and family unable to manage care at home.  Per chart pt s/p fall 2/1 (slipped in shower): negative acute L sided rib fx, pleural effusion, or pneumothorax; also no definitive evidence of acute fx or dislocation of L knee (prominent indentation of one of lateral femoral condyles anteriorly may be related to previous injury without associated joint effusion to suggest acute abnormality).  Per chart pt with rapid decline last 6 months (dementia).  PMH includes polysubstance abuse, htn, h/o stroke, HIV infection, seizures, neuromuscular disorder, dementia associated with underlying disease with behavioral disturbance, closed reduction patellar R knee.     Follow Up Recommendations  Outpatient PT     Equipment Recommendations  Rolling walker with 5" wheels    Recommendations for  Other Services       Precautions / Restrictions Precautions Precautions: Fall Restrictions Weight Bearing Restrictions: No    Mobility  Bed Mobility Overal bed mobility: Independent                Transfers Overall transfer level: Independent Equipment used: None;Rolling walker (2 wheeled)                Ambulation/Gait Ambulation/Gait assistance: Supervision Gait Distance (Feet): 80 Feet Assistive device: Rolling walker (2 wheeled)       General Gait Details: Pt demonstrated decreased step length bilaterally, shuffled feet, overall steady with RW use   Stairs             Wheelchair Mobility    Modified Rankin (Stroke Patients Only)       Balance Overall balance assessment: Needs assistance Sitting-balance support: No upper extremity supported;Feet supported Sitting balance-Leahy Scale: Normal     Standing balance support: No upper extremity supported;During functional activity Standing balance-Leahy Scale: Good Standing balance comment: no loss of balance noted with ambulation (without or with AD)                            Cognition Arousal/Alertness: Awake/alert Behavior During Therapy: WFL for tasks assessed/performed Overall Cognitive Status: Within Functional Limits for tasks assessed                                 General Comments: Oriented to person and place (therapist did not ask situation or time)      Exercises      General Comments  Pertinent Vitals/Pain Pain Assessment: Faces Faces Pain Scale: Hurts a little bit Pain Location: L knee Pain Descriptors / Indicators: Sore;Tender Pain Intervention(s): Limited activity within patient's tolerance;Monitored during session;Repositioned    Home Living                      Prior Function            PT Goals (current goals can now be found in the care plan section) Progress towards PT goals: Progressing toward goals     Frequency    Min 2X/week      PT Plan Current plan remains appropriate    Co-evaluation              AM-PAC PT "6 Clicks" Mobility   Outcome Measure  Help needed turning from your back to your side while in a flat bed without using bedrails?: None Help needed moving from lying on your back to sitting on the side of a flat bed without using bedrails?: None Help needed moving to and from a bed to a chair (including a wheelchair)?: None Help needed standing up from a chair using your arms (e.g., wheelchair or bedside chair)?: None Help needed to walk in hospital room?: A Little Help needed climbing 3-5 steps with a railing? : A Little 6 Click Score: 22    End of Session   Activity Tolerance: Patient tolerated treatment well Patient left: in bed Nurse Communication: Mobility status PT Visit Diagnosis: Other abnormalities of gait and mobility (R26.89);History of falling (Z91.81);Difficulty in walking, not elsewhere classified (R26.2);Pain Pain - Right/Left: Left Pain - part of body: Knee     Time: 9833-8250 PT Time Calculation (min) (ACUTE ONLY): 16 min  Charges:  $Therapeutic Exercise: 8-22 mins                     Lieutenant Diego PT, DPT 4:40 PM,07/24/19

## 2019-07-24 NOTE — ED Notes (Signed)
Patient laying in bed watching tv, patient asked to sit up in bed for breakfast.

## 2019-07-24 NOTE — NC FL2 (Signed)
[place] LEVEL OF CARE SCREENING TOOL     IDENTIFICATION  Patient Name: Peter Cox Birthdate: 1961-03-14 Sex: male Admission Date (Current Location): 06/20/2019  [place] and [place] Number:  Engineering geologist and Address:  [facility], [address] [place], [place] [address]      Provider Number: [id number]  Attending Physician Name and Address:  No att. providers found  Relative Name and Phone Number:       Current Level of Care: Hospital Recommended Level of Care: [place] Prior Approval Number:    Date Approved/Denied:   PASRR Number:    Discharge Plan: Other (Comment)(Memory care)    Current Diagnoses: Patient Active Problem List   Diagnosis Date Noted  . History of substance abuse ([place]) 09/17/2018  . Urinary incontinence 09/17/2018  . Bilateral primary osteoarthritis of knee 09/13/2018  . Decreased activities of daily living (ADL) 09/13/2018  . Alcohol abuse 11/24/2014  . Dementia associated with other underlying disease with behavioral disturbance ([place]) 07/02/2014  . History of CVA (cerebrovascular accident) 07/02/2014  . CKD stage 2 11/20/2008  . Human immunodeficiency virus (HIV) disease ([place]) 08/17/2008  . HLD (hyperlipidemia) 06/26/2008  . Essential hypertension 06/24/2008    Orientation RESPIRATION BLADDER Height & Weight     Self  Normal Incontinent(occassional night time incontinence) Weight: 144 lb (65.3 kg) Height:  5\' 4"  (162.6 cm)  BEHAVIORAL SYMPTOMS/MOOD NEUROLOGICAL BOWEL NUTRITION STATUS  Wanderer, Dangerous to self, others or property   Continent Diet  AMBULATORY STATUS COMMUNICATION OF NEEDS Skin   Limited Assist Verbally(episodes of being non verbal) Normal                       Personal Care Assistance Level of Assistance  Bathing, Feeding, Dressing Bathing Assistance: Limited assistance Feeding assistance: Limited assistance Dressing Assistance: Limited  assistance     Functional Limitations Info             SPECIAL CARE FACTORS FREQUENCY                       Contractures      Additional Factors Info                  Current Medications (07/24/2019):  This is the current hospital active medication list Current Facility-Administered Medications  Medication Dose Route Frequency Provider Last Rate Last Admin  . amLODipine (NORVASC) tablet 10 mg  10 mg Oral Daily Earleen Newport, MD   10 mg at 07/23/19 0933  . atorvastatin (LIPITOR) tablet 20 mg  20 mg Oral Daily Earleen Newport, MD   20 mg at 07/23/19 3762  . bictegravir-emtricitabine-tenofovir AF (BIKTARVY) 50-200-25 MG per tablet 1 tablet  1 tablet Oral Daily Earleen Newport, MD   1 tablet at 07/23/19 0934  . divalproex (DEPAKOTE SPRINKLE) capsule 250 mg  250 mg Oral Q12H Earleen Newport, MD   250 mg at 07/23/19 2103  . donepezil (ARICEPT) tablet 10 mg  10 mg Oral QHS Earleen Newport, MD   10 mg at 07/23/19 2103  . labetalol (NORMODYNE) tablet 100 mg  100 mg Oral BID Earleen Newport, MD   100 mg at 07/23/19 2103  . OLANZapine (ZYPREXA) tablet 5 mg  5 mg Oral Daily PRN Earleen Newport, MD   5 mg at 07/06/19 8315   Current Outpatient Medications  Medication Sig Dispense Refill  . amLODipine (NORVASC)  10 MG tablet Take 1 tablet (10 mg total) by mouth daily. 30 tablet 11  . atorvastatin (LIPITOR) 20 MG tablet Take 20 mg by mouth daily.    . bictegravir-emtricitabine-tenofovir AF (BIKTARVY) 50-200-25 MG TABS tablet Take 1 tablet by mouth daily. 30 tablet 2  . divalproex (DEPAKOTE SPRINKLE) 125 MG capsule Take 2 capsules (250 mg total) by mouth every 12 (twelve) hours. 60 capsule 2  . donepezil (ARICEPT) 10 MG tablet Take 1/2 tablet daily for 2 weeks, then increase to 1 tablet daily (Patient taking differently: Take 10 mg by mouth daily. ) 30 tablet 11  . labetalol (NORMODYNE) 100 MG tablet Take 1 tablet (100 mg total) by mouth 2 (two) times  daily. 60 tablet 5  . OLANZapine (ZYPREXA) 5 MG tablet Take 1 tablet (5 mg total) by mouth daily as needed (agitation). 30 tablet 3     Discharge Medications: Please see discharge summary for a list of discharge medications.  Relevant Imaging Results:  Relevant Lab Results:   Additional Information SS# [id number]  Adelene Amas, LCSWA

## 2019-07-25 DIAGNOSIS — F29 Unspecified psychosis not due to a substance or known physiological condition: Secondary | ICD-10-CM | POA: Diagnosis not present

## 2019-07-25 NOTE — ED Notes (Signed)
Assisted patient with changing soiled clothing.AS

## 2019-07-25 NOTE — ED Notes (Signed)
Pt ate 90% of breakfast.

## 2019-07-25 NOTE — ED Notes (Signed)
Fall bracelet and yellow socks placed on pt.

## 2019-07-25 NOTE — ED Notes (Signed)
Gave pt [place] tray and drink.AS

## 2019-07-25 NOTE — ED Notes (Signed)
Pt. Up using bathroom, pt. Returned to room with steady gait.

## 2019-07-25 NOTE — NC FL2 (Signed)
[place] LEVEL OF CARE SCREENING TOOL     IDENTIFICATION  Patient Name: Peter Cox Birthdate: [birth year] Sex: male Admission Date (Current Location): 06/20/2019  [facility] and [place] Number:  Engineering geologist and Address:  [facility], [address], [place], [place] [address]      Provider Number: [id number]  Attending Physician Name and Address:  No att. providers found  Relative Name and Phone Number:       Current Level of Care: Hospital Recommended Level of Care: [place], [place], Memory Care Prior Approval Number:    Date Approved/Denied:   PASRR Number:    Discharge Plan: SNF    Current Diagnoses: Patient Active Problem List   Diagnosis Date Noted  . History of substance abuse ([place]) 09/17/2018  . Urinary incontinence 09/17/2018  . Bilateral primary osteoarthritis of knee 09/13/2018  . Decreased activities of daily living (ADL) 09/13/2018  . Alcohol abuse 11/24/2014  . Dementia associated with other underlying disease with behavioral disturbance ([place]) 07/02/2014  . History of CVA (cerebrovascular accident) 07/02/2014  . CKD stage 2 11/20/2008  . Human immunodeficiency virus (HIV) disease ([place]) 08/17/2008  . HLD (hyperlipidemia) 06/26/2008  . Essential hypertension 06/24/2008    Orientation RESPIRATION BLADDER Height & Weight     Self  Normal Incontinent(occassional night time incontinence) Weight: 144 lb (65.3 kg) Height:  5\' 4"  (162.6 cm)  BEHAVIORAL SYMPTOMS/MOOD NEUROLOGICAL BOWEL NUTRITION STATUS  Wanderer, Dangerous to self, others or property   Continent Diet  AMBULATORY STATUS COMMUNICATION OF NEEDS Skin   Limited Assist Verbally(episodes of being non verbal) Normal                       Personal Care Assistance Level of Assistance  Bathing, Feeding, Dressing Bathing Assistance: Limited assistance Feeding assistance: Limited assistance Dressing Assistance:  Limited assistance     Functional Limitations Info             SPECIAL CARE FACTORS FREQUENCY                       Contractures      Additional Factors Info                  Current Medications (07/25/2019):  This is the current hospital active medication list Current Facility-Administered Medications  Medication Dose Route Frequency Provider Last Rate Last Admin  . amLODipine (NORVASC) tablet 10 mg  10 mg Oral Daily Earleen Newport, MD   10 mg at 07/25/19 0915  . atorvastatin (LIPITOR) tablet 20 mg  20 mg Oral Daily Earleen Newport, MD   20 mg at 07/25/19 0915  . bictegravir-emtricitabine-tenofovir AF (BIKTARVY) 50-200-25 MG per tablet 1 tablet  1 tablet Oral Daily Earleen Newport, MD   1 tablet at 07/25/19 0915  . divalproex (DEPAKOTE SPRINKLE) capsule 250 mg  250 mg Oral Q12H Earleen Newport, MD   250 mg at 07/25/19 0915  . donepezil (ARICEPT) tablet 10 mg  10 mg Oral QHS Earleen Newport, MD   10 mg at 07/24/19 2113  . labetalol (NORMODYNE) tablet 100 mg  100 mg Oral BID Earleen Newport, MD   100 mg at 07/25/19 0915  . OLANZapine (ZYPREXA) tablet 5 mg  5 mg Oral Daily PRN Earleen Newport, MD   5 mg at 07/06/19 0932   Current Outpatient Medications  Medication Sig Dispense Refill  .  amLODipine (NORVASC) 10 MG tablet Take 1 tablet (10 mg total) by mouth daily. 30 tablet 11  . atorvastatin (LIPITOR) 20 MG tablet Take 20 mg by mouth daily.    . bictegravir-emtricitabine-tenofovir AF (BIKTARVY) 50-200-25 MG TABS tablet Take 1 tablet by mouth daily. 30 tablet 2  . divalproex (DEPAKOTE SPRINKLE) 125 MG capsule Take 2 capsules (250 mg total) by mouth every 12 (twelve) hours. 60 capsule 2  . donepezil (ARICEPT) 10 MG tablet Take 1/2 tablet daily for 2 weeks, then increase to 1 tablet daily (Patient taking differently: Take 10 mg by mouth daily. ) 30 tablet 11  . labetalol (NORMODYNE) 100 MG tablet Take 1 tablet (100 mg total) by mouth 2  (two) times daily. 60 tablet 5  . OLANZapine (ZYPREXA) 5 MG tablet Take 1 tablet (5 mg total) by mouth daily as needed (agitation). 30 tablet 3     Discharge Medications: Please see discharge summary for a list of discharge medications.  Relevant Imaging Results:  Relevant Lab Results:   Additional Information SS# [id number]  Adelene Amas, LCSWA

## 2019-07-25 NOTE — ED Provider Notes (Signed)
-----------------------------------------   5:43 AM on 07/25/2019 -----------------------------------------   Blood pressure (!) 141/75, pulse 65, temperature 97.8 F (36.6 C), temperature source Oral, resp. rate 16, height 5\' 4"  (1.626 m), weight 65.3 kg, SpO2 100 %.  The patient had no acute events since last update.  Calm and cooperative at this time.  Disposition is pending per Psychiatry/Behavioral Medicine team recommendations.     Alfred Levins, [place], MD 07/25/19 [phone]

## 2019-07-25 NOTE — ED Notes (Signed)
Pt given meal tray.

## 2019-07-25 NOTE — Care Management (Signed)
TOC RN CM: Received voicemail from [place], patients sister, requesting update regarding discharge plan. States she is wanting patient placed closer to [place] to be close to family. Requested callback to [phone]. RN CM attempted to callback and number is disconnected.   TOC RN CM: Call to daughter updating that sister had called but number was not valid. Daughter states that his sister is wanting to take care of patient at her house however she is not a safe discharge plan due to her own health concerns and inability to monitor patients wanderer. Daughter is concerned about other family members trying to take advantage of patient financially. Updated sister on bed search and confirmed RN CM contact info.

## 2019-07-25 NOTE — ED Notes (Signed)
IVC/  PENDING  PLACEMENT 

## 2019-07-25 NOTE — TOC Progression Note (Addendum)
Transition of Care [facility]) - CM/SW Discharge Note   Patient Details  Name: ETHANAEL VEITH MRN: [id number] Date of Birth: 1960-06-29  [facility] CM/SW Contact:  Anselm Pancoast, RN Phone Number: 07/25/2019, 12:30 PM   Clinical Narrative:    Tawni Carnes  To [facility] in [place]  for potential placement. Faxed to [phone].   Call from [facility] @ [facility] states no availability in memory care unit and unable to accept patients in ALF or skilled unit.         Patient Goals and CMS Choice Patient states their goals for this hospitalization and ongoing recovery are:: no answer from patient-daughter states she wants him safe      Discharge Placement                       Discharge Plan and Services                                     Social Determinants of Health (SDOH) Interventions     Readmission Risk Interventions No flowsheet data found.

## 2019-07-26 DIAGNOSIS — F29 Unspecified psychosis not due to a substance or known physiological condition: Secondary | ICD-10-CM | POA: Diagnosis not present

## 2019-07-26 NOTE — ED Provider Notes (Signed)
-----------------------------------------   5:16 AM on 07/26/2019 -----------------------------------------   Blood pressure (!) 151/95, pulse (!) 58, temperature 97.8 F (36.6 C), temperature source Oral, resp. rate 16, height 1.626 m (5\' 4" ), weight 65.3 kg, SpO2 100 %.  The patient is calm and cooperative at this time.  There have been no acute events since the last update.  Awaiting disposition plan from Behavioral Medicine and/or Social Work team(s).   Hinda Kehr, MD 07/26/19 [phone]

## 2019-07-26 NOTE — ED Notes (Signed)
IVC/Pending Placement 

## 2019-07-26 NOTE — Progress Notes (Signed)
Physical Therapy Treatment Patient Details Name: Peter Cox MRN: [id number] DOB: 11/12/1960 Today's Date: 07/26/2019    History of Present Illness Pt is a 59 y.o. male presenting to hospital 06/20/19: "Patient presents with hallucinations, possible delusional disorder" resulting in safety concerns at home and family unable to manage care at home.  Per chart pt s/p fall 2/1 (slipped in shower): negative acute L sided rib fx, pleural effusion, or pneumothorax; also no definitive evidence of acute fx or dislocation of L knee (prominent indentation of one of lateral femoral condyles anteriorly may be related to previous injury without associated joint effusion to suggest acute abnormality).  Per chart pt with rapid decline last 6 months (dementia).  PMH includes polysubstance abuse, htn, h/o stroke, HIV infection, seizures, neuromuscular disorder, dementia associated with underlying disease with behavioral disturbance, closed reduction patellar R knee.    PT Comments    Pt sleeping lightly but awakens easily.  Bed mobility without assist.  Seated AROM BLE with cues to complete full ROM and to slow down.  He is able to stand to RW and walk 1 1/2 laps x 2 around unit - 120' each.  Overall steady gait with RW despite decreased step height and length which increases fall risk.  Pt should have +1 assist with mobility for safety.  Overall pleasant and cooperative with treatment and remained sitting EOB after session.     Follow Up Recommendations  Outpatient PT     Equipment Recommendations  Rolling walker with 5" wheels    Recommendations for Other Services       Precautions / Restrictions Precautions Precautions: Fall Restrictions Weight Bearing Restrictions: No    Mobility  Bed Mobility Overal bed mobility: Independent                Transfers Overall transfer level: Independent                  Ambulation/Gait Ambulation/Gait assistance: Supervision Gait Distance  (Feet): 120 Feet Assistive device: Rolling walker (2 wheeled) Gait Pattern/deviations: Step-through pattern;Decreased step length - right;Decreased step length - left     General Gait Details: Pt demonstrated decreased step length bilaterally, shuffled feet, overall steady with RW use   Stairs             Wheelchair Mobility    Modified Rankin (Stroke Patients Only)       Balance Overall balance assessment: Needs assistance Sitting-balance support: No upper extremity supported;Feet supported Sitting balance-Leahy Scale: Normal Sitting balance - Comments: steady sitting reaching outside BOS   Standing balance support: No upper extremity supported;During functional activity Standing balance-Leahy Scale: Good Standing balance comment: no loss of balance noted with ambulation (without or with AD)                            Cognition Arousal/Alertness: Awake/alert Behavior During Therapy: WFL for tasks assessed/performed Overall Cognitive Status: Within Functional Limits for tasks assessed                                        Exercises General Exercises - Lower Extremity Long Arc Quad: AROM;Strengthening;Left;10 reps;Seated    General Comments        Pertinent Vitals/Pain Pain Assessment: Faces Faces Pain Scale: Hurts a little bit Pain Location: L knee Pain Descriptors / Indicators: Sore;Tender Pain Intervention(s): Limited activity within  patient's tolerance;Monitored during session    Home Living                      Prior Function            PT Goals (current goals can now be found in the care plan section) Progress towards PT goals: Progressing toward goals    Frequency    Min 2X/week      PT Plan Current plan remains appropriate    Co-evaluation              AM-PAC PT "6 Clicks" Mobility   Outcome Measure  Help needed turning from your back to your side while in a flat bed without using  bedrails?: None Help needed moving from lying on your back to sitting on the side of a flat bed without using bedrails?: None Help needed moving to and from a bed to a chair (including a wheelchair)?: None Help needed standing up from a chair using your arms (e.g., wheelchair or bedside chair)?: None Help needed to walk in hospital room?: A Little Help needed climbing 3-5 steps with a railing? : A Little 6 Click Score: 22    End of Session Equipment Utilized During Treatment: Gait belt Activity Tolerance: Patient tolerated treatment well Patient left: in bed Nurse Communication: Mobility status Pain - Right/Left: Left Pain - part of body: Knee     Time: 1440-1451 PT Time Calculation (min) (ACUTE ONLY): 11 min  Charges:  $Gait Training: 8-22 mins                    Chesley Noon, PTA 07/26/19, 3:14 PM

## 2019-07-26 NOTE — Care Management (Signed)
TOC RN CM: LVMM completing APS report for assistance with confirming who has legal guardianship.

## 2019-07-26 NOTE — Care Management (Addendum)
TOC RN CM: Call from daughter, Peter Cox, [phone] requesting no information be released to other family members.   Incoming call from patients sister, Peter Cox [phone] wanting medical update. RN CM advised daughter is not really his daughter and is only wanting his money. RN CM explained concerns regarding placement and plans to outreach to APS for confirmation. Sister very frustrated with lack of placement options and states they will not agree to anything further than [place].   Call to [facility] @ DSS who is covering for [employer]. Explained concerns over who is able to make decisions for this patient and need for assistance with placement. Magda Paganini suggested that another APS report be filed for an incompetent adult with no legal authority to speak for himself. Family is fighting over placement issues but both agreeable to APS report being filed.

## 2019-07-27 DIAGNOSIS — F29 Unspecified psychosis not due to a substance or known physiological condition: Secondary | ICD-10-CM | POA: Diagnosis not present

## 2019-07-27 NOTE — ED Notes (Signed)
Pt easily awakened, but has stayed in bed since his shower this morning. Pt denies any medical complaints.  Pt appear to be dry and states he is dry at his time.

## 2019-07-27 NOTE — ED Notes (Signed)
Pt in shower.  

## 2019-07-27 NOTE — ED Notes (Signed)
Pt given breakfast tray

## 2019-07-28 DIAGNOSIS — F29 Unspecified psychosis not due to a substance or known physiological condition: Secondary | ICD-10-CM | POA: Diagnosis not present

## 2019-07-28 NOTE — ED Notes (Signed)
Patient given Sprite and encouraged to hydrate.

## 2019-07-28 NOTE — ED Notes (Signed)
Pt ambulated to bathroom. RN provided walker for assistance.

## 2019-07-28 NOTE — ED Notes (Signed)
Hourly rounding reveals patient sleeping in room. No complaints, stable, in no acute distress. Q15 minute rounds and monitoring via Security Cameras to continue. 

## 2019-07-28 NOTE — ED Notes (Signed)
Pt ate 100% of breakfast.

## 2019-07-28 NOTE — ED Notes (Signed)
Hourly rounding reveals patient awake in room. No complaints, stable, in no acute distress. Q15 minute rounds and monitoring via Security Cameras to continue. 

## 2019-07-28 NOTE — ED Notes (Signed)
Pt given lunch tray.

## 2019-07-28 NOTE — ED Notes (Signed)
Pt given meal tray. Pt alert and up eating.

## 2019-07-28 NOTE — ED Notes (Addendum)
Pt's walker kept in nurses station.  RN told pt to let staff know (wave) when  needs to get up so he can use the walker.

## 2019-07-28 NOTE — ED Notes (Signed)
Pt has taken a shower with chair and monitored by EDT.  Bed linens changed. Trash taken out of room.

## 2019-07-28 NOTE — ED Notes (Signed)
Pt assisted to bathroom and changing wet clothes. Bed changed. Pt returned to bed. No co's voiced encouraged to call for assistance if needed.

## 2019-07-28 NOTE — ED Notes (Signed)
Report to include Situation, Background, Assessment, and Recommendations received from Amy B. RN. Patient alert and, warm and dry, in no acute distress. Patient denies SI, HI, AVH and pain. Patient made aware of Q15 minute rounds and security cameras for their safety. Patient instructed to come to me with needs or concerns.

## 2019-07-28 NOTE — ED Provider Notes (Signed)
-----------------------------------------   6:18 AM on 07/28/2019 -----------------------------------------   Blood pressure (!) 157/76, pulse 76, temperature 98.2 F (36.8 C), temperature source Oral, resp. rate 18, height 5\' 4"  (1.626 m), weight 65.3 kg, SpO2 100 %.  The patient is calm and cooperative at this time.  There have been no acute events since the last update.  Awaiting disposition plan from Behavioral Medicine and/or Social Work team(s).    Merlyn Lot, MD 07/28/19 [phone]

## 2019-07-28 NOTE — ED Notes (Signed)
Pt ate 100% of lunch tray

## 2019-07-28 NOTE — ED Notes (Signed)
Patient to rest room without assistance. No problems noted.

## 2019-07-29 DIAGNOSIS — F29 Unspecified psychosis not due to a substance or known physiological condition: Secondary | ICD-10-CM | POA: Diagnosis not present

## 2019-07-29 NOTE — ED Notes (Signed)
Hourly rounding reveals patient awake in room. No complaints, stable, in no acute distress. Q15 minute rounds and monitoring via Security Cameras to continue. 

## 2019-07-29 NOTE — ED Provider Notes (Signed)
-----------------------------------------   3:34 AM on 07/29/2019 -----------------------------------------   Blood pressure (!) 154/73, pulse 63, temperature 98.2 F (36.8 C), temperature source Oral, resp. rate 16, height 1.626 m (5\' 4" ), weight 65.3 kg, SpO2 100 %.  The patient is calm and cooperative at this time.  There have been no acute events since the last update.  Awaiting disposition plan from Behavioral Medicine and/or Social Work team(s).   Hinda Kehr, MD 07/29/19 [phone]

## 2019-07-29 NOTE — ED Notes (Signed)
Nurse ask Patient if He needed to go to restroom and He ask for walker, states that He will go soon, He is eating lunch at this time, will continue to monitor, no signs of distress. Patient denies Si/hi or avh. Patient refuses shower at this time.

## 2019-07-29 NOTE — ED Notes (Signed)
Hourly rounding reveals patient sleeping in room. No complaints, stable, in no acute distress. Q15 minute rounds and monitoring via Security Cameras to continue. 

## 2019-07-29 NOTE — ED Notes (Signed)
Report to include Situation, Background, Assessment, and Recommendations received from [facility]. Patient alert, warm and dry, in no acute distress. Patient denies SI, HI, AVH and pain. Patient made aware of Q15 minute rounds and security cameras for their safety. Patient instructed to come to me with needs or concerns.

## 2019-07-29 NOTE — Care Management (Addendum)
TOC RN CM: Faxed Fl2 to [facility] for review.   LVMM for follow up on APS report. Report was started last week with no follow up.

## 2019-07-29 NOTE — ED Notes (Signed)
Patient to rest room without incident. Ice water given.

## 2019-07-29 NOTE — ED Notes (Signed)
Snack and beverage given. 

## 2019-07-30 DIAGNOSIS — F29 Unspecified psychosis not due to a substance or known physiological condition: Secondary | ICD-10-CM | POA: Diagnosis not present

## 2019-07-30 NOTE — ED Notes (Signed)
Hourly rounding reveals patient sleeping in room. No complaints, stable, in no acute distress. Q15 minute rounds and monitoring via Security Cameras to continue. 

## 2019-07-30 NOTE — ED Notes (Signed)
Hourly rounding reveals patient awake in room. No complaints, stable, in no acute distress. Q15 minute rounds and monitoring via Security Cameras to continue. 

## 2019-07-30 NOTE — Progress Notes (Signed)
Physical Therapy Treatment Patient Details Name: Peter Cox MRN: [id number] DOB: [birth year] Today's Date: 07/30/2019    History of Present Illness Pt is a 59 y.o. male presenting to hospital 06/20/19: "Patient presents with hallucinations, possible delusional disorder" resulting in safety concerns at home and family unable to manage care at home.  Per chart pt s/p fall 2/1 (slipped in shower): negative acute L sided rib fx, pleural effusion, or pneumothorax; also no definitive evidence of acute fx or dislocation of L knee (prominent indentation of one of lateral femoral condyles anteriorly may be related to previous injury without associated joint effusion to suggest acute abnormality).  Per chart pt with rapid decline last 6 months (dementia).  PMH includes polysubstance abuse, htn, h/o stroke, HIV infection, seizures, neuromuscular disorder, dementia associated with underlying disease with behavioral disturbance, closed reduction patellar R knee.    PT Comments    Pt is making good progress towards goals with ability to ambulate around common area in multiple directions including sideways, anterior/posterior direction. RW used with safe technique with occasional cues for step clearance. Good endurance with there-ex. Will continue to progress. Pt is making progress towards PLOF.  Follow Up Recommendations  Outpatient PT     Equipment Recommendations  Rolling walker with 5" wheels    Recommendations for Other Services       Precautions / Restrictions Precautions Precautions: Fall Restrictions Weight Bearing Restrictions: No    Mobility  Bed Mobility Overal bed mobility: Independent             General bed mobility comments: safe technique with ease of transfer in/out of bed.  Transfers Overall transfer level: Independent Equipment used: None             General transfer comment: safe transfers with upright posture.  Ambulation/Gait Ambulation/Gait assistance: Min  guard Gait Distance (Feet): 200 Feet Assistive device: Rolling walker (2 wheeled) Gait Pattern/deviations: Step-through pattern;Decreased step length - right;Decreased step length - left     General Gait Details: shuffle gait with low foot clearance on R foot, improves with cues. Multiple laps performed in common space. Safe technique using RW without LOB    Stairs             Wheelchair Mobility    Modified Rankin (Stroke Patients Only)       Balance Overall balance assessment: Needs assistance Sitting-balance support: No upper extremity supported;Feet supported Sitting balance-Leahy Scale: Normal Sitting balance - Comments: steady sitting reaching outside BOS   Standing balance support: No upper extremity supported;During functional activity Standing balance-Leahy Scale: Good Standing balance comment: no loss of balance noted with ambulation (without or with AD)                            Cognition Arousal/Alertness: Awake/alert Behavior During Therapy: WFL for tasks assessed/performed Overall Cognitive Status: Within Functional Limits for tasks assessed                                 General Comments: Oriented to person and place (therapist did not ask situation or time)      Exercises Other Exercises Other Exercises: standing ther-ex performed including B LE squats, hip abd, stepping/toe taps in forward direction, and heel raises. All ther-ex performed x 10 reps with supervision. Follows commands well Other Exercises: Performed gait ther-ex including sidestepping and retroambulation without RW. Decreased step length  and occasional LOB in post direction with retroambulation. 2 laps performed in each direction    General Comments        Pertinent Vitals/Pain Pain Assessment: No/denies pain    Home Living                      Prior Function            PT Goals (current goals can now be found in the care plan section)  Acute Rehab PT Goals Patient Stated Goal: to have less L knee pain PT Goal Formulation: With patient Time For Goal Achievement: 08/06/19 Potential to Achieve Goals: Good Progress towards PT goals: Progressing toward goals    Frequency    Min 2X/week      PT Plan Current plan remains appropriate    Co-evaluation              AM-PAC PT "6 Clicks" Mobility   Outcome Measure  Help needed turning from your back to your side while in a flat bed without using bedrails?: None Help needed moving from lying on your back to sitting on the side of a flat bed without using bedrails?: None Help needed moving to and from a bed to a chair (including a wheelchair)?: None Help needed standing up from a chair using your arms (e.g., wheelchair or bedside chair)?: None Help needed to walk in hospital room?: A Little Help needed climbing 3-5 steps with a railing? : A Little 6 Click Score: 22    End of Session Equipment Utilized During Treatment: Gait belt Activity Tolerance: Patient tolerated treatment well Patient left: in bed Nurse Communication: Mobility status PT Visit Diagnosis: Other abnormalities of gait and mobility (R26.89);History of falling (Z91.81);Difficulty in walking, not elsewhere classified (R26.2);Pain Pain - Right/Left: Left Pain - part of body: Knee     Time: 6759-1638 PT Time Calculation (min) (ACUTE ONLY): 24 min  Charges:  $Gait Training: 8-22 mins $Therapeutic Exercise: 8-22 mins                     Greggory Stallion, PT, DPT [phone]    Peter Cox 07/30/2019, 4:37 PM

## 2019-07-30 NOTE — ED Notes (Signed)
Report to include Situation, Background, Assessment, and Recommendations received from [place]. Patient alert, warm and dry, in no acute distress. Patient denies SI, HI, AVH and pain. Patient made aware of Q15 minute rounds and security cameras for their safety. Patient instructed to come to me with needs or concerns.

## 2019-07-30 NOTE — ED Notes (Signed)
IVC/ Pending Placement through CSW/Care Management/ Legal papers to be renewed on 07/31/19

## 2019-07-30 NOTE — ED Provider Notes (Signed)
-----------------------------------------   7:58 AM on 07/30/2019 -----------------------------------------  Blood pressure (!) 150/71, pulse (!) 54, temperature 97.9 F (36.6 C), temperature source Oral, resp. rate 16, height 5\' 4"  (1.626 m), weight 65.3 kg, SpO2 100 %.  The patient is calm and cooperative at this time.  There have been no acute events since the last update.  Awaiting disposition plan from Behavioral Medicine team.   Blake Divine, MD 07/30/19 [phone]

## 2019-07-30 NOTE — Care Management (Signed)
TOC RN CM: Interview from Manuela Schwartz with [facility] for potential placement. [place] requested follow up on special assistance Medicaid and guardianship confirmation through APS. Potential placement early next week for Higher Standards of Care.

## 2019-07-31 DIAGNOSIS — F29 Unspecified psychosis not due to a substance or known physiological condition: Secondary | ICD-10-CM | POA: Diagnosis not present

## 2019-07-31 NOTE — ED Notes (Signed)
Hourly rounding reveals patient sleeping in room. No complaints, stable, in no acute distress. Q15 minute rounds and monitoring via Security Cameras to continue. 

## 2019-07-31 NOTE — Progress Notes (Signed)
Physical Therapy Treatment Patient Details Name: Peter Cox MRN: [id number] DOB: 02/26/1961 Today's Date: 07/31/2019    History of Present Illness Pt is a 59 y.o. male presenting to hospital 06/20/19: "Patient presents with hallucinations, possible delusional disorder" resulting in safety concerns at home and family unable to manage care at home.  Per chart pt s/p fall 2/1 (slipped in shower): negative acute L sided rib fx, pleural effusion, or pneumothorax; also no definitive evidence of acute fx or dislocation of L knee (prominent indentation of one of lateral femoral condyles anteriorly may be related to previous injury without associated joint effusion to suggest acute abnormality).  Per chart pt with rapid decline last 6 months (dementia).  PMH includes polysubstance abuse, htn, h/o stroke, HIV infection, seizures, neuromuscular disorder, dementia associated with underlying disease with behavioral disturbance, closed reduction patellar R knee.    PT Comments    Pt is making good progress towards goals, anticipate 1 more session prior to dc from acute services. Pt sleeping upon arrival, however agreeable to therapy. Engaged and is demonstrating improved balance with majority of ambulation performed without AD. Higher level strength performed demonstrating improvement in B LE strength/function. Reports no pain in knees this date. Performed TUG in 26 seconds demonstrating decreased power. No LOB noted. Will continue to progress.   Follow Up Recommendations  Outpatient PT     Equipment Recommendations  Rolling walker with 5" wheels    Recommendations for Other Services       Precautions / Restrictions Precautions Precautions: Fall Restrictions Weight Bearing Restrictions: No    Mobility  Bed Mobility Overal bed mobility: Independent             General bed mobility comments: safe technique with ease of transfer in/out of bed.  Transfers Overall transfer level:  Independent Equipment used: None             General transfer comment: safe transfers with upright posture.  Ambulation/Gait Ambulation/Gait assistance: Supervision Gait Distance (Feet): 300 Feet Assistive device: Rolling walker (2 wheeled) Gait Pattern/deviations: Step-through pattern;Decreased step length - right;Decreased step length - left     General Gait Details: improved gait pattern this session with reciprocal gait pattern and improved foot clearance. No fatigue present and pt able to navigate turns without LOB   [employer] Rankin (Stroke Patients Only)       Balance Overall balance assessment: Needs assistance Sitting-balance support: No upper extremity supported;Feet supported Sitting balance-Leahy Scale: Normal Sitting balance - Comments: steady sitting reaching outside BOS   Standing balance support: No upper extremity supported;During functional activity Standing balance-Leahy Scale: Good Standing balance comment: no loss of balance noted with ambulation (without or with AD)                            Cognition Arousal/Alertness: Awake/alert Behavior During Therapy: WFL for tasks assessed/performed Overall Cognitive Status: Within Functional Limits for tasks assessed                                 General Comments: Oriented to person and place (therapist did not ask situation or time)      Exercises Other Exercises Other Exercises: Standing ther-ex performed in common space including B LE toe taps (using standard chair) without UE support x 20,  sit<>stand transfers x 10, squats x 12 reps, and TUG in 26 seconds using RW. Other Exercises: Performed gait ther-ex including sidestepping without RW. Decreased step length. and slow speed. 2 laps performed in each direction    General Comments        Pertinent Vitals/Pain Pain Assessment: No/denies pain    Home Living                       Prior Function            PT Goals (current goals can now be found in the care plan section) Acute Rehab PT Goals Patient Stated Goal: to have less L knee pain PT Goal Formulation: With patient Time For Goal Achievement: 08/06/19 Potential to Achieve Goals: Good Progress towards PT goals: Progressing toward goals    Frequency    Min 2X/week      PT Plan Current plan remains appropriate    Co-evaluation              AM-PAC PT "6 Clicks" Mobility   Outcome Measure  Help needed turning from your back to your side while in a flat bed without using bedrails?: None Help needed moving from lying on your back to sitting on the side of a flat bed without using bedrails?: None Help needed moving to and from a bed to a chair (including a wheelchair)?: None Help needed standing up from a chair using your arms (e.g., wheelchair or bedside chair)?: None Help needed to walk in hospital room?: A Little Help needed climbing 3-5 steps with a railing? : A Little 6 Click Score: 22    End of Session Equipment Utilized During Treatment: Gait belt Activity Tolerance: Patient tolerated treatment well Patient left: in bed Nurse Communication: Mobility status PT Visit Diagnosis: Other abnormalities of gait and mobility (R26.89);History of falling (Z91.81);Difficulty in walking, not elsewhere classified (R26.2);Pain Pain - Right/Left: Left Pain - part of body: Knee     Time: 1525-1550 PT Time Calculation (min) (ACUTE ONLY): 25 min  Charges:  $Gait Training: 8-22 mins $Therapeutic Exercise: 8-22 mins                     Greggory Stallion, PT, DPT [phone]    Alvena Kiernan 07/31/2019, 4:21 PM

## 2019-07-31 NOTE — ED Notes (Signed)
Pt given meal tray.

## 2019-07-31 NOTE — Care Management (Signed)
TOC RN CM: LVMM for [place] [facility] requesting follow up on previous messages left for APS report. Need to get clarification on guardianship, special assistance medicaid and who specific case worker is in order to finalize transfer to [facility].

## 2019-07-31 NOTE — ED Provider Notes (Signed)
-----------------------------------------   4:43 AM on 07/31/2019 -----------------------------------------   Blood pressure 138/80, pulse 60, temperature (!) 97.5 F (36.4 C), temperature source Oral, resp. rate 18, height 5\' 4"  (1.626 m), weight 65.3 kg, SpO2 100 %.  The patient had no acute events since last update.  Calm and cooperative at this time.  Disposition is pending per Psychiatry/Behavioral Medicine team recommendations.     Rudene Re, MD 07/31/19 [phone]

## 2019-08-01 DIAGNOSIS — F29 Unspecified psychosis not due to a substance or known physiological condition: Secondary | ICD-10-CM | POA: Diagnosis not present

## 2019-08-01 NOTE — ED Notes (Signed)
Pt given meal tray.

## 2019-08-01 NOTE — ED Provider Notes (Signed)
-----------------------------------------   10:00 AM on 08/01/2019 -----------------------------------------   Blood pressure (!) 171/90, pulse (!) 57, temperature 97.7 F (36.5 C), temperature source Oral, resp. rate 18, height 5\' 4"  (1.626 m), weight 65.3 kg, SpO2 100 %.  The patient is calm and cooperative at this time.  There have been no acute events since the last update.  Awaiting disposition plan from Behavioral Medicine and/or Social Work team(s).    Merlyn Lot, MD 08/01/19 1000

## 2019-08-01 NOTE — ED Notes (Signed)
IVC/Still Pending Placement

## 2019-08-01 NOTE — Social Work (Signed)
TOC SW:  Contacted [name] (APS supervisor) [phone], for status update on patient.  Left voicemail awaiting a response.

## 2019-08-01 NOTE — ED Notes (Signed)
Pt given breakfast tray

## 2019-08-02 DIAGNOSIS — F29 Unspecified psychosis not due to a substance or known physiological condition: Secondary | ICD-10-CM | POA: Diagnosis not present

## 2019-08-02 NOTE — ED Notes (Signed)
IVC/  PENDING  PLACEMENT 

## 2019-08-02 NOTE — ED Notes (Signed)
Gave patient [place] tray and drink. Gave clean blankets.AS

## 2019-08-02 NOTE — Progress Notes (Signed)
PT Cancellation Note  Patient Details Name: Peter Cox MRN: [id number] DOB: Jan 22, 1961   Cancelled Treatment:    Reason Eval/Treat Not Completed: Other (comment). Treatment attempted, however pt sleeping in bed and reports he is very fatigued and politely refuses therapy this date. Will re-attempt next available date.    Zoha Spranger 08/02/2019, 4:13 PM  Greggory Stallion, PT, DPT [phone]

## 2019-08-02 NOTE — ED Provider Notes (Signed)
-----------------------------------------   6:36 AM on 08/02/2019 -----------------------------------------   Blood pressure 125/61, pulse 61, temperature 98.2 F (36.8 C), resp. rate 18, height 1.626 m (5\' 4" ), weight 65.3 kg, SpO2 100 %.  The patient is calm and cooperative at this time.  There have been no acute events since the last update.  Awaiting disposition plan from Behavioral Medicine and/or Social Work team(s).   Hinda Kehr, MD 08/02/19 [phone]

## 2019-08-02 NOTE — ED Notes (Signed)
Patient had episode of urinary incontinence. Patient given clean clothes and underwear to change into. Full linen/bed changed performed by this RN. Soiled linens/clothes removed from room and placed into appropriate receptacles.

## 2019-08-03 DIAGNOSIS — F29 Unspecified psychosis not due to a substance or known physiological condition: Secondary | ICD-10-CM | POA: Diagnosis not present

## 2019-08-03 NOTE — ED Notes (Signed)
Pt. Currently sleeping on bed.

## 2019-08-03 NOTE — ED Notes (Signed)
Pt. Up walking to bathroom, pt. Used bathroom and returned to room #3 with steady gait.

## 2019-08-03 NOTE — ED Notes (Signed)
Pt given lunch tray.

## 2019-08-03 NOTE — ED Notes (Signed)
Pt. Up using bathroom, pt. Returned to room with steady gait.

## 2019-08-03 NOTE — ED Provider Notes (Signed)
-----------------------------------------   3:57 AM on 08/03/2019 -----------------------------------------   Blood pressure (!) 142/71, pulse 60, temperature 97.6 F (36.4 C), temperature source Oral, resp. rate 15, height 5\' 4"  (1.626 m), weight 65.3 kg, SpO2 100 %.  The patient is calm and cooperative at this time.  There have been no acute events since the last update.  Awaiting disposition plan from Behavioral Medicine and/or Social Work team(s).    Harvest Dark, MD 08/03/19 [phone]

## 2019-08-03 NOTE — ED Notes (Signed)
This RN discussed with charge RN moving patient back to major side ED due to patient needing walker and being high fall risk and no bed alarm to see if patient getting up due to patient not being allowed to have walker in room due to having high violence risk patients around him.

## 2019-08-03 NOTE — ED Notes (Signed)
Gave pt food tray with juice. 

## 2019-08-03 NOTE — ED Notes (Signed)
ivc/pending placement.. 

## 2019-08-04 DIAGNOSIS — F29 Unspecified psychosis not due to a substance or known physiological condition: Secondary | ICD-10-CM | POA: Diagnosis not present

## 2019-08-04 NOTE — ED Notes (Signed)
Pt. Up using bathroom.  Pt. Requested and was given drink.

## 2019-08-04 NOTE — ED Notes (Signed)
Pt given items to take shower

## 2019-08-04 NOTE — ED Provider Notes (Signed)
-----------------------------------------   4:00 AM on 08/04/2019 -----------------------------------------  Blood pressure (!) 168/88, pulse 70, temperature 98.2 F (36.8 C), temperature source Oral, resp. rate 18, height 5\' 4"  (1.626 m), weight 65.3 kg, SpO2 100 %.  The patient is calm and cooperative at this time.  There have been no acute events since the last update.  Awaiting disposition plan from Behavioral Medicine team.   Blake Divine, MD 08/04/19 0400

## 2019-08-04 NOTE — ED Notes (Signed)
Pt. Up using bathroom, pt. Returned to room with steady gait.

## 2019-08-05 DIAGNOSIS — F29 Unspecified psychosis not due to a substance or known physiological condition: Secondary | ICD-10-CM | POA: Diagnosis not present

## 2019-08-05 NOTE — ED Notes (Signed)
Pt given dinner tray.

## 2019-08-05 NOTE — ED Notes (Signed)
Report to include Situation, Background, Assessment, and Recommendations received from [facility]. Patient alert, warm and dry, in no acute distress. Patient denies SI, HI, AVH and pain. Patient made aware of Q15 minute rounds and security cameras for their safety. Patient instructed to come to me with needs or concerns.

## 2019-08-05 NOTE — ED Notes (Signed)
PT at bedside working with patient at this time.

## 2019-08-05 NOTE — ED Notes (Signed)
Pt had saturated his bed linens. Pt  linens changed and new ones placed. Pt given shower supplies and assisted to shower. Pt showered and new clothes on at this time. Pt assisted back to room. Pt's medications given and vitals taken. Pt eating breakfast at this time.

## 2019-08-05 NOTE — ED Notes (Signed)
Pt given cola. Pt ambulatory with steady gait back to room. Pt updated on breakfast coming soon.

## 2019-08-05 NOTE — ED Notes (Signed)
Hourly rounding reveals patient awake in room. No complaints, stable, in no acute distress. Q15 minute rounds and monitoring via Security Cameras to continue. 

## 2019-08-05 NOTE — ED Notes (Signed)
IVC/ Still pending placement

## 2019-08-05 NOTE — ED Notes (Signed)
Clean chux placed on pt's bed. Pt given wipes to clean himself up with. Pt assisted into clean brief. Pt food tray set up and pt eating at this time.

## 2019-08-05 NOTE — Progress Notes (Signed)
Physical Therapy Treatment Patient Details Name: Peter Cox MRN: [id number] DOB: 1961-04-21 Today's Date: 08/05/2019    History of Present Illness Pt is a 59 y.o. male presenting to hospital 06/20/19: "Patient presents with hallucinations, possible delusional disorder" resulting in safety concerns at home and family unable to manage care at home.  Per chart pt s/p fall 2/1 (slipped in shower): negative acute L sided rib fx, pleural effusion, or pneumothorax; also no definitive evidence of acute fx or dislocation of L knee (prominent indentation of one of lateral femoral condyles anteriorly may be related to previous injury without associated joint effusion to suggest acute abnormality).  Per chart pt with rapid decline last 6 months (dementia).  PMH includes polysubstance abuse, htn, h/o stroke, HIV infection, seizures, neuromuscular disorder, dementia associated with underlying disease with behavioral disturbance, closed reduction patellar R knee.    PT Comments    Pt ready for session.  Bed mobility without assist.  Stood and was able to walk 40' without AD with short shuffling gait.  Pt stated he felt more comfortable with walker verses no AD.  Obtained walker and he is able to walk forward on unit with supervision.  When asked to walk backwards, he has more trouble and requires min a to prevent post LOB after more than a few steps.  He is aware of imbalances but does not stop and self correct.  Cues to stop walking and gain balance.  Verbal cues and education given and he is able to control gait with slow step to backwards gait.  Participated in exercises as described below.  Attempted to do [employer] test but he does not remain standing long enough for testing before initiating sitting.  Stated he is fatigued.  He is able to stand from chair with general ease.  Laughing throughout session.  Per chart, Primary PT noted possible discharge from therapy after 1 more session.  She is not here  today to talk with so I will defer to her to decide if he will continue with therapy or if today will be considered last session and to update chart as appropriate.   Follow Up Recommendations  Outpatient PT     Equipment Recommendations  Rolling walker with 5" wheels    Recommendations for Other Services       Precautions / Restrictions Precautions Precautions: Fall    Mobility  Bed Mobility Overal bed mobility: Independent             General bed mobility comments: safe technique with ease of transfer in/out of bed.  Transfers Overall transfer level: Independent Equipment used: None                Ambulation/Gait Ambulation/Gait assistance: Supervision;Min guard Gait Distance (Feet): 300 Feet Assistive device: Rolling walker (2 wheeled);None Gait Pattern/deviations: Step-through pattern;Decreased step length - right;Decreased step length - left;Shuffle Gait velocity: decreased (improved with RW use)   General Gait Details: 80' without AD - pt stated he prefers RW vs no AD.  Obtained RW from nursing station.  He is able to walk throughout session with min gaurd and RW,  Difficulty with backwards gait requires min a at times and verbal cues for safety/sequencing   Stairs             Wheelchair Mobility    Modified Rankin (Stroke Patients Only)       Balance Overall balance assessment: Needs assistance Sitting-balance support: No upper extremity supported;Feet supported Sitting balance-Leahy Scale: Normal Sitting  balance - Comments: steady sitting reaching outside BOS   Standing balance support: No upper extremity supported;During functional activity Standing balance-Leahy Scale: Good Standing balance comment: no loss of balance noted with ambulation (without or with AD) when forward.  Increased difficulty with post gait.                            Cognition Arousal/Alertness: Awake/alert Behavior During Therapy: WFL for tasks  assessed/performed Overall Cognitive Status: Within Functional Limits for tasks assessed                                        Exercises Other Exercises Other Exercises: heel raises and SLR in standing with RW Other Exercises: emphasis on post gait.    General Comments        Pertinent Vitals/Pain Pain Assessment: No/denies pain    Home Living                      Prior Function            PT Goals (current goals can now be found in the care plan section) Progress towards PT goals: Progressing toward goals    Frequency    Min 2X/week      PT Plan Current plan remains appropriate    Co-evaluation              AM-PAC PT "6 Clicks" Mobility   Outcome Measure  Help needed turning from your back to your side while in a flat bed without using bedrails?: None Help needed moving from lying on your back to sitting on the side of a flat bed without using bedrails?: None Help needed moving to and from a bed to a chair (including a wheelchair)?: None Help needed standing up from a chair using your arms (e.g., wheelchair or bedside chair)?: None Help needed to walk in hospital room?: A Little Help needed climbing 3-5 steps with a railing? : A Little 6 Click Score: 22    End of Session Equipment Utilized During Treatment: Gait belt Activity Tolerance: Patient tolerated treatment well Patient left: in bed Nurse Communication: Mobility status Pain - Right/Left: Left Pain - part of body: Knee     Time: 6606-3016 PT Time Calculation (min) (ACUTE ONLY): 23 min  Charges:  $Gait Training: 8-22 mins $Therapeutic Exercise: 8-22 mins                     Chesley Noon, PTA 08/05/19, 2:47 PM

## 2019-08-05 NOTE — ED Notes (Signed)
Pt given lunch tray.

## 2019-08-05 NOTE — ED Notes (Signed)
Hourly rounding reveals patient sleeping in room. No complaints, stable, in no acute distress. Q15 minute rounds and monitoring via Security Cameras to continue. 

## 2019-08-05 NOTE — ED Provider Notes (Signed)
-----------------------------------------   5:52 AM on 08/05/2019 -----------------------------------------   Blood pressure 133/85, pulse 82, temperature 97.8 F (36.6 C), temperature source Oral, resp. rate 14, height 5\' 4"  (1.626 m), weight 65.3 kg, SpO2 100 %.  The patient is calm and cooperative at this time.  There have been no acute events since the last update.  Awaiting disposition plan from Behavioral Medicine and/or Social Work team(s).    Harvest Dark, MD 08/05/19 [phone]

## 2019-08-06 DIAGNOSIS — F29 Unspecified psychosis not due to a substance or known physiological condition: Secondary | ICD-10-CM | POA: Diagnosis not present

## 2019-08-06 NOTE — ED Notes (Signed)
Hourly rounding reveals patient awake in room. No complaints, stable, in no acute distress. Q15 minute rounds and monitoring via Security Cameras to continue. 

## 2019-08-06 NOTE — Care Management (Addendum)
TOC SW:  Contacted [name] (APS supervisor) [phone], for status update on patient. States patient has been assigned to [employer] [facility]. Virgilio Belling states she will have [employer] to [facility] for follow up. Patient is pending group home placement for DSS confirmation on special assistance medicaid and guardianship.

## 2019-08-06 NOTE — ED Notes (Signed)
Pt asleep. Meal tray placed in pt rm.

## 2019-08-06 NOTE — ED Notes (Signed)
IVC/  PENDING  PLACEMENT 

## 2019-08-06 NOTE — ED Notes (Signed)
Report to include Situation, Background, Assessment, and Recommendations received from Amy Teague RN. Patient alert and oriented, warm and dry, in no acute distress. Patient denies SI, HI, AVH and pain. Patient made aware of Q15 minute rounds and security cameras for their safety. Patient instructed to come to me with needs or concerns. 

## 2019-08-06 NOTE — ED Notes (Signed)
Hourly rounding reveals patient sleeping in room. No complaints, stable, in no acute distress. Q15 minute rounds and monitoring via Security Cameras to continue. 

## 2019-08-06 NOTE — ED Provider Notes (Signed)
-----------------------------------------   12:18 AM on 08/06/2019 -----------------------------------------  Blood pressure 140/83, pulse 75, temperature 97.6 F (36.4 C), temperature source Oral, resp. rate 16, height 5\' 4"  (1.626 m), weight 65.3 kg, SpO2 100 %.  The patient is calm and cooperative at this time.  There have been no acute events since the last update.  Awaiting disposition plan from Behavioral Medicine team.   Blake Divine, MD 08/06/19 [phone]

## 2019-08-07 DIAGNOSIS — F29 Unspecified psychosis not due to a substance or known physiological condition: Secondary | ICD-10-CM | POA: Diagnosis not present

## 2019-08-07 NOTE — ED Notes (Signed)
Hourly rounding reveals patient sleeping in room. No complaints, stable, in no acute distress. Q15 minute rounds and monitoring via Security Cameras to continue. 

## 2019-08-07 NOTE — ED Notes (Signed)
Hourly rounding reveals patient awake in room. No complaints, stable, in no acute distress. Q15 minute rounds and monitoring via Security Cameras to continue. 

## 2019-08-07 NOTE — ED Notes (Signed)
Pt. Up using bathroom, pt returned to room with steady gait.

## 2019-08-07 NOTE — ED Notes (Signed)
Pt. Laying in bed watching tv.  Pt. Appears to be in a good mood.  Pt. Requested and was given a cup of soda.

## 2019-08-07 NOTE — ED Notes (Signed)
Pt provided with lunch tray and a ginger ale.

## 2019-08-07 NOTE — Care Management (Signed)
RN CM: Spoke to [place] @ [phone]. Advised patient was looking at [facility] and would need special assistance Medicaid to cover cost. Advised [place] once admitted to a family care home.   RN CM: Spoke to daughter and advised she would start the [place] once accepted to [facility].   RN CM outreached to [place] @ [phone] to update daughter is guardian and will start [place] medicaid process. Baxter Flattery states he is waiting on a patient to move out and should be having an open room soon. Will call RN CM back and update of specific availability.

## 2019-08-07 NOTE — ED Notes (Signed)
Pt provided with meal tray.

## 2019-08-07 NOTE — ED Provider Notes (Signed)
-----------------------------------------   5:20 AM on 08/07/2019 -----------------------------------------   Blood pressure 124/70, pulse (!) 51, temperature 98.2 F (36.8 C), temperature source Oral, resp. rate 16, height 1.626 m (5\' 4" ), weight 65.3 kg, SpO2 100 %.  The patient is calm and cooperative at this time.  There have been no acute events since the last update.  Awaiting disposition plan from Behavioral Medicine and/or Social Work team(s).   Hinda Kehr, MD 08/07/19 [phone]

## 2019-08-07 NOTE — ED Notes (Addendum)
Hourly rounding reveals patient awake in room. No complaints, stable, in no acute distress. Q15 minute rounds and monitoring via Security Cameras to continue. 

## 2019-08-07 NOTE — ED Notes (Signed)
Pt given breakfast tray

## 2019-08-08 DIAGNOSIS — F29 Unspecified psychosis not due to a substance or known physiological condition: Secondary | ICD-10-CM | POA: Diagnosis not present

## 2019-08-08 NOTE — ED Provider Notes (Signed)
-----------------------------------------   7:29 AM on 08/08/2019 -----------------------------------------   Blood pressure 106/83, pulse (!) 57, temperature 98.1 F (36.7 C), temperature source Oral, resp. rate 18, height 5\' 4"  (1.626 m), weight 65.3 kg, SpO2 100 %.  The patient is calm and cooperative at this time.  There have been no acute events since the last update.  Awaiting disposition plan from Behavioral Medicine and/or Social Work team(s).    Duffy Bruce, MD 08/08/19 [phone]

## 2019-08-08 NOTE — ED Notes (Signed)
Assumed care of patient patient up and out of bed early this morning. Patient ambulated to bathroom w/o assist. Patient was incontinent of bladder. Patient was given supplies for AM care and change of clothing/ diaper. Awaiting breakfast. Safety maintained. Will continue to monitor.

## 2019-08-08 NOTE — ED Notes (Signed)
Dinner tray provided

## 2019-08-08 NOTE — ED Notes (Signed)
patient out of bed to bathroom with no assist, patient ambulates with minimal shuffle.

## 2019-08-08 NOTE — ED Notes (Signed)
PT at bedside to consult patient.

## 2019-08-08 NOTE — ED Notes (Signed)
Laying in ed shower offered patient declined.

## 2019-08-08 NOTE — Evaluation (Signed)
Physical Therapy Re-Evaluation Patient Details Name: Peter Cox MRN: [id number] DOB: [birth year] Today's Date: 08/08/2019   History of Present Illness  Pt is a 59 y.o. male presenting to hospital 06/20/19: "Patient presents with hallucinations, possible delusional disorder" resulting in safety concerns at home and family unable to manage care at home.  Per chart pt s/p fall 2/1 (slipped in shower): negative acute L sided rib fx, pleural effusion, or pneumothorax; also no definitive evidence of acute fx or dislocation of L knee (prominent indentation of one of lateral femoral condyles anteriorly may be related to previous injury without associated joint effusion to suggest acute abnormality).  Per chart pt with rapid decline last 6 months (dementia).  PMH includes polysubstance abuse, htn, h/o stroke, HIV infection, seizures, neuromuscular disorder, dementia associated with underlying disease with behavioral disturbance, closed reduction patellar R knee.  Clinical Impression  PT re-evaluation performed d/t pt's extended hospitalization.  Pt reporting L knee pain but pt did not rate it when asked and pt reporting no pain to palpation L knee.  Pt initially with mild decreased stance time L LE when ambulating (pt reporting d/t L knee feeling stiff) but as session continued pt demonstrating continued decreased stance time L LE with L knee mildly flexed during L LE stance phase.  Pt able to walk without AD and appearing steady (mild shuffling gait noted) but improved gait pattern noted with ambulation using RW (pt reports he prefers using RW for walking).  Nurse reports pt not demonstrating this altered gait pattern earlier today and pt was actually walking like he did prior to fall in shower (with mild shuffling gait but no antalgic type gait).  Question if altered gait mechanics behavioral related.  Will plan to see pt 1 more session to monitor pt's progress and any further PT needs.    Follow Up  Recommendations Outpatient PT    Equipment Recommendations  Rolling walker with 5" wheels    Recommendations for Other Services       Precautions / Restrictions Precautions Precautions: Fall Restrictions Weight Bearing Restrictions: No      Mobility  Bed Mobility Overal bed mobility: Independent             General bed mobility comments: No difficulties noted supine to/from sit  Transfers Overall transfer level: Independent Equipment used: None             General transfer comment: steady safe transfers noted (with or without RW use)  Ambulation/Gait Ambulation/Gait assistance: Supervision Gait Distance (Feet): (20 feet no AD; 120 feet RW; 30 feet no AD; 30 feet RW) Assistive device: Rolling walker (2 wheeled);None   Gait velocity: decreased (improved with RW use)   General Gait Details: shuffling gait; increased antalgic gait as session continued (decreased stance time L LE); steady with or without AD  Stairs            Wheelchair Mobility    Modified Rankin (Stroke Patients Only)       Balance Overall balance assessment: Needs assistance Sitting-balance support: No upper extremity supported;Feet supported Sitting balance-Leahy Scale: Normal Sitting balance - Comments: steady sitting reaching outside BOS   Standing balance support: No upper extremity supported;During functional activity Standing balance-Leahy Scale: Good Standing balance comment: no loss of balance noted with ambulation                             Pertinent Vitals/Pain Pain Assessment: Faces Pain Location: L  knee Pain Descriptors / Indicators: Sore;Tender Pain Intervention(s): Limited activity within patient's tolerance;Monitored during session;Repositioned    Home Living                   Additional Comments: Per chart pt was living with his daughter but unable to take pt back d/t safety concerns (per chart pt with h/o wandering middle of night  prior to ED admit)    Prior Function Level of Independence: Independent               Hand Dominance        Extremity/Trunk Assessment   Upper Extremity Assessment Upper Extremity Assessment: Overall WFL for tasks assessed    Lower Extremity Assessment Lower Extremity Assessment: (R LE WFL) LLE Deficits / Details: 4+/5 hip flexion; 4/5 L knee extension and flexion; 4+/5 DF    Cervical / Trunk Assessment Cervical / Trunk Assessment: Normal  Communication   Communication: No difficulties(pt often laughing during session)  Cognition Arousal/Alertness: Awake/alert Behavior During Therapy: WFL for tasks assessed/performed                                   General Comments: Oriented to at least person (pt only laughed when therapist asked place and time)      General Comments   Nursing cleared pt for participation in physical therapy.  Pt agreeable to PT session.    Exercises     Assessment/Plan    PT Assessment Patient needs continued PT services  PT Problem List Decreased strength;Decreased activity tolerance;Decreased range of motion;Decreased mobility;Decreased knowledge of use of DME;Pain       PT Treatment Interventions DME instruction;Gait training;Functional mobility training;Therapeutic activities;Therapeutic exercise;Balance training;Patient/family education    PT Goals (Current goals can be found in the Care Plan section)  Acute Rehab PT Goals Patient Stated Goal: to improve walking PT Goal Formulation: With patient Time For Goal Achievement: 08/22/19 Potential to Achieve Goals: Good    Frequency Min 2X/week   Barriers to discharge        Co-evaluation               AM-PAC PT "6 Clicks" Mobility  Outcome Measure Help needed turning from your back to your side while in a flat bed without using bedrails?: None Help needed moving from lying on your back to sitting on the side of a flat bed without using bedrails?: None Help  needed moving to and from a bed to a chair (including a wheelchair)?: None Help needed standing up from a chair using your arms (e.g., wheelchair or bedside chair)?: None Help needed to walk in hospital room?: A Little Help needed climbing 3-5 steps with a railing? : A Little 6 Click Score: 22    End of Session Equipment Utilized During Treatment: Gait belt Activity Tolerance: Patient tolerated treatment well Patient left: in bed Nurse Communication: Mobility status;Precautions PT Visit Diagnosis: Other abnormalities of gait and mobility (R26.89);History of falling (Z91.81);Difficulty in walking, not elsewhere classified (R26.2);Pain Pain - Right/Left: Left Pain - part of body: Knee    Time: 4193-7902 PT Time Calculation (min) (ACUTE ONLY): 18 min   Charges:   PT Evaluation $PT Re-evaluation: 1 Re-eval         Leitha Bleak, PT 08/08/19, 3:17 PM

## 2019-08-08 NOTE — ED Notes (Signed)
IVC/  PENDING  PLACEMENT 

## 2019-08-08 NOTE — ED Notes (Signed)
Pt. Up using bathroom.  Pt. Came out of bathroom and requested cup of juice, pt. Given cup of apple juice.

## 2019-08-09 DIAGNOSIS — F29 Unspecified psychosis not due to a substance or known physiological condition: Secondary | ICD-10-CM | POA: Diagnosis not present

## 2019-08-09 NOTE — ED Notes (Signed)
Changed pt bed linens and pad while walking with PT. When finished assisted pt in changing brief and put on fresh scrubs.

## 2019-08-09 NOTE — ED Notes (Signed)
Dinner tray provided

## 2019-08-09 NOTE — ED Notes (Signed)
Patient sitting up in bed awake and watching TV, breakfast provided.

## 2019-08-09 NOTE — ED Notes (Signed)
Patient ambulated to bathroom w/o difficulties.

## 2019-08-09 NOTE — ED Notes (Signed)
Patient out of bed to nurses station to bring his trash to garbage. Patient ambulating without difficulties.

## 2019-08-09 NOTE — ED Notes (Signed)
Pt given dinner tray.

## 2019-08-09 NOTE — ED Notes (Signed)
Patient found incontinent patient advised to let nurse or tech know when he has an accident. Patient given supplies for peri care and change of clothes.

## 2019-08-09 NOTE — ED Notes (Signed)
Assumed care of patient. Patient slept through the night, as reported by prior nurse patient was up 4 times through the to go to bathroom unassisted with no difficulties. Patient was not incontinent this morning due to patient getting up on his own to go void. Safety maintained. Will continue to monitor throughout the day.

## 2019-08-09 NOTE — Progress Notes (Signed)
Physical Therapy Treatment Patient Details Name: RODGERS LIKES MRN: [id number] DOB: 1961-03-23 Today's Date: 08/09/2019    History of Present Illness Pt is a 59 y.o. male presenting to hospital 06/20/19: "Patient presents with hallucinations, possible delusional disorder" resulting in safety concerns at home and family unable to manage care at home.  Per chart pt s/p fall 2/1 (slipped in shower): negative acute L sided rib fx, pleural effusion, or pneumothorax; also no definitive evidence of acute fx or dislocation of L knee (prominent indentation of one of lateral femoral condyles anteriorly may be related to previous injury without associated joint effusion to suggest acute abnormality).  Per chart pt with rapid decline last 6 months (dementia).  PMH includes polysubstance abuse, htn, h/o stroke, HIV infection, seizures, neuromuscular disorder, dementia associated with underlying disease with behavioral disturbance, closed reduction patellar R knee.    PT Comments    Pt sitting up in bed eating lunch upon PT arrival.  Pt independent with bed mobility, transfers, and ambulation.  Shuffling gait still noted (per staff appears to be baseline gait mechanics); very mild decreased stance time noted L LE.  Overall pt appearing steady and safe with ambulation without AD.  No further acute PT needs identified; will sign off (discussed with pt's nurse).  Please re-consult PT if pt's status changes and acute PT needs are identified.   Follow Up Recommendations  No PT follow up     Equipment Recommendations  None recommended by PT    Recommendations for Other Services       Precautions / Restrictions Precautions Precautions: Fall Restrictions Weight Bearing Restrictions: No    Mobility  Bed Mobility Overal bed mobility: Independent             General bed mobility comments: semi-supine to sitting edge of bed  Transfers Overall transfer level: Independent Equipment used: None              General transfer comment: steady safe transfer noted from bed (no AD use)  Ambulation/Gait Ambulation/Gait assistance: Independent Gait Distance (Feet): 150 Feet Assistive device: None   Gait velocity: decreased   General Gait Details: shuffling gait; very mild decreased stance time L LE; steady   Stairs             Wheelchair Mobility    Modified Rankin (Stroke Patients Only)       Balance Overall balance assessment: Independent Sitting-balance support: No upper extremity supported;Feet supported Sitting balance-Leahy Scale: Normal Sitting balance - Comments: steady sitting reaching outside BOS   Standing balance support: No upper extremity supported;During functional activity Standing balance-Leahy Scale: Good Standing balance comment: no loss of balance noted with ambulation                            Cognition Arousal/Alertness: Awake/alert Behavior During Therapy: WFL for tasks assessed/performed                                   General Comments: Oriented to at least person      Exercises      General Comments   Nursing cleared pt for participation in physical therapy.  Pt agreeable to PT session.  Upon pt getting OOB, pt's bed noted to be wet (NT notified and came to assist pt with clean-up).      Pertinent Vitals/Pain Pain Assessment: Faces Faces Pain Scale: No  hurt Pain Intervention(s): Limited activity within patient's tolerance;Monitored during session;Repositioned    Home Living                      Prior Function            PT Goals (current goals can now be found in the care plan section) Acute Rehab PT Goals Patient Stated Goal: to improve walking PT Goal Formulation: With patient Time For Goal Achievement: 08/22/19 Potential to Achieve Goals: Good Progress towards PT goals: Progressing toward goals    Frequency    Min 2X/week      PT Plan Current plan remains appropriate     Co-evaluation              AM-PAC PT "6 Clicks" Mobility   Outcome Measure  Help needed turning from your back to your side while in a flat bed without using bedrails?: None Help needed moving from lying on your back to sitting on the side of a flat bed without using bedrails?: None Help needed moving to and from a bed to a chair (including a wheelchair)?: None Help needed standing up from a chair using your arms (e.g., wheelchair or bedside chair)?: None Help needed to walk in hospital room?: None Help needed climbing 3-5 steps with a railing? : None 6 Click Score: 24    End of Session   Activity Tolerance: Patient tolerated treatment well Patient left: (standing with NT present to assist pt with changing clothes) Nurse Communication: Mobility status;Precautions PT Visit Diagnosis: Other abnormalities of gait and mobility (R26.89);History of falling (Z91.81);Difficulty in walking, not elsewhere classified (R26.2);Pain Pain - Right/Left: Left Pain - part of body: Knee     Time: 3419-6222 PT Time Calculation (min) (ACUTE ONLY): 8 min  Charges:  $Therapeutic Activity: 8-22 mins                    Leitha Bleak, PT 08/09/19, 2:32 PM

## 2019-08-09 NOTE — ED Provider Notes (Signed)
-----------------------------------------   8:13 AM on 08/09/2019 -----------------------------------------   Blood pressure (!) 144/77, pulse 65, temperature 98.3 F (36.8 C), temperature source Oral, resp. rate 17, height 5\' 4"  (1.626 m), weight 65.3 kg, SpO2 98 %.  The patient is calm and cooperative at this time.  There have been no acute events since the last update.  Awaiting disposition plan from Behavioral Medicine and/or Social Work team(s).    Delman Kitten, MD 08/09/19 [phone]

## 2019-08-09 NOTE — ED Notes (Signed)
Pt given lunch tray.

## 2019-08-09 NOTE — TOC Progression Note (Signed)
Transition of Care [facility]) - Progression Note    Patient Details  Name: Peter Cox MRN: [id number] Date of Birth: 13-Apr-1961  [facility] CM/SW Hardy, RN Phone Number: 08/09/2019, 2:38 PM  Clinical Narrative:    Discussed case with Kase @ [facility]. Plan remains to admit to [facility] once bed open. Waiting on bed availability, possible opening in next week. Will continue to monitor bed availability.    Expected Discharge Plan: Memory Care    Expected Discharge Plan and Services Expected Discharge Plan: Memory Care       Living arrangements for the past 2 months: Single Family Home                                       Social Determinants of Health (SDOH) Interventions    Readmission Risk Interventions No flowsheet data found.

## 2019-08-10 DIAGNOSIS — F29 Unspecified psychosis not due to a substance or known physiological condition: Secondary | ICD-10-CM | POA: Diagnosis not present

## 2019-08-10 NOTE — ED Notes (Signed)
Pt urinated on himself. Pt up to bathroom and told staff. Pt showered and linens cleaned.

## 2019-08-10 NOTE — ED Notes (Signed)
Pt sleeping at this time.

## 2019-08-10 NOTE — ED Notes (Signed)
Pt ate 100% of meal tray 

## 2019-08-10 NOTE — ED Notes (Signed)
Meal tray given 

## 2019-08-10 NOTE — ED Notes (Signed)
Pt was given food tray and drink. Which he took and started eating.

## 2019-08-10 NOTE — ED Notes (Signed)
Pt. Currently sleeping on bed in room.

## 2019-08-11 DIAGNOSIS — F29 Unspecified psychosis not due to a substance or known physiological condition: Secondary | ICD-10-CM | POA: Diagnosis not present

## 2019-08-11 NOTE — ED Notes (Signed)
Hourly rounding reveals patient sleeping in room. No complaints, stable, in no acute distress. Q15 minute rounds and monitoring via Security Cameras to continue. 

## 2019-08-11 NOTE — ED Notes (Signed)
Pt continues to rest in bed, pt continues to be eating his meal tray at this time.

## 2019-08-11 NOTE — ED Notes (Signed)
Pt continues to rest in bed with lights dimmed and TV on.

## 2019-08-11 NOTE — ED Notes (Signed)
Pt given meal tray at this time by Nicki Reaper, EDT.

## 2019-08-11 NOTE — ED Notes (Signed)
Pt resting in bed at this time, pt with lights dimmed and TV on at this time.

## 2019-08-11 NOTE — ED Notes (Addendum)
Dietary called regarding patient's meal tray at this time. States will send another tray for patient.

## 2019-08-11 NOTE — ED Notes (Signed)
Pt. Up using bathroom, pt. Returned to bed with steady gait.

## 2019-08-11 NOTE — ED Notes (Signed)
Medications administered per order, 100mg  Labetolol held due to patient HR 61 with VS check. EDP made aware that patient received 10mg  Norvasc with daily meds.

## 2019-08-11 NOTE — ED Notes (Signed)
Pt. Up using bathroom, pt. Returned to room #B[facility] #4

## 2019-08-11 NOTE — ED Notes (Signed)
Pt. Up using bathroom.  Pt. Brought up his dinner tray to throw away.  Pt. Returned to room with steady gait.

## 2019-08-11 NOTE — ED Notes (Signed)
Pt visualized resting in bed at this time, lights remain dimmed and TV remains on.

## 2019-08-11 NOTE — ED Notes (Signed)
Pt visualized resting in bed, NAD noted at this time. Pt with lights dimmed for comfort. Pt visualized smiling at TV at this time.

## 2019-08-11 NOTE — ED Notes (Signed)
Pt continues to rest in bed, continuing to await meal tray delivery from dining services. TV on for patient comfort and entertainment.

## 2019-08-11 NOTE — ED Notes (Signed)
Hourly rounding reveals patient awake in room. No complaints, stable, in no acute distress. Q15 minute rounds and monitoring via Security Cameras to continue. 

## 2019-08-11 NOTE — ED Notes (Signed)
Pt bed linens and pad changed while up. Assisted pt with shower and changed into clean burgundy scrubs.

## 2019-08-11 NOTE — ED Notes (Signed)
Clean sheets placed on bed by Elmyra Ricks, EDT due to patient soiling sheets with urine. Pt assisted with shower by Elmyra Ricks, EDT, assisted with ADL's by Elmyra Ricks, EDT.

## 2019-08-11 NOTE — ED Notes (Signed)
Report to include Situation, Background, Assessment, and Recommendations received from [facility]. Patient alert and oriented, warm and dry, in no acute distress. Patient denies SI, HI, AVH and pain. Patient made aware of Q15 minute rounds and security cameras for their safety. Patient instructed to come to me with needs or concerns.

## 2019-08-12 DIAGNOSIS — F29 Unspecified psychosis not due to a substance or known physiological condition: Secondary | ICD-10-CM | POA: Diagnosis not present

## 2019-08-12 NOTE — ED Notes (Signed)
Hourly rounding reveals patient sleeping in room. No complaints, stable, in no acute distress. Q15 minute rounds and monitoring via Security Cameras to continue. 

## 2019-08-12 NOTE — ED Notes (Signed)
Hourly rounding reveals patient in day room. No complaints, stable, in no acute distress. Q15 minute rounds and monitoring via Security Cameras to continue. 

## 2019-08-12 NOTE — ED Notes (Signed)
Patient ate 75% of lunch and beverage, will continue to monitor.

## 2019-08-12 NOTE — ED Provider Notes (Signed)
6:00 AM on 08/12/2019  Blood pressure 129/77, pulse 65, temperature 97.7 F (36.5 C), temperature source Oral, resp. rate 16, height 1.626 m (5\' 4" ), weight 65.3 kg, SpO2 100 %.  The patient is calm and cooperative at this time.  There have been no acute events since the last update.  Awaiting disposition plan from Behavioral Medicine team.   Gregor Hams, MD 08/12/19 [phone]

## 2019-08-12 NOTE — ED Notes (Signed)
IVC/ Still pending placement/ Legal papers to be renewed on 08/14/19 by 0930

## 2019-08-12 NOTE — ED Notes (Signed)
Hourly rounding reveals patient awake in room. No complaints, stable, in no acute distress. Q15 minute rounds and monitoring via Security Cameras to continue. 

## 2019-08-12 NOTE — ED Notes (Signed)
Hourly rounding reveals patient in rest room. No complaints, stable, in no acute distress. Q15 minute rounds and monitoring via Security Cameras to continue. 

## 2019-08-12 NOTE — ED Notes (Signed)
Patient is eating dinner, no signs of distress, He has been cooperative, no signs of distress.

## 2019-08-12 NOTE — ED Notes (Signed)
Pt given meal tray.

## 2019-08-13 DIAGNOSIS — F29 Unspecified psychosis not due to a substance or known physiological condition: Secondary | ICD-10-CM | POA: Diagnosis not present

## 2019-08-13 NOTE — ED Provider Notes (Signed)
-----------------------------------------   5:59 AM on 08/13/2019 -----------------------------------------   Blood pressure 130/81, pulse 61, temperature 97.6 F (36.4 C), temperature source Oral, resp. rate 16, height 5\' 4"  (1.626 m), weight 65.3 kg, SpO2 100 %.  The patient is calm and cooperative at this time.  There have been no acute events since the last update.  Awaiting disposition plan from Behavioral Medicine and/or Social Work team(s).   Paulette Blanch, MD 08/13/19 [phone]

## 2019-08-13 NOTE — ED Notes (Signed)
Hourly rounding reveals patient sleeping in room. No complaints, stable, in no acute distress. Q15 minute rounds and monitoring via Security Cameras to continue. 

## 2019-08-13 NOTE — ED Notes (Signed)
Report to include Situation, Background, Assessment, and Recommendations received from Amy Teague RN. Patient alert and oriented, warm and dry, in no acute distress. Patient denies SI, HI, AVH and pain. Patient made aware of Q15 minute rounds and security cameras for their safety. Patient instructed to come to me with needs or concerns. 

## 2019-08-13 NOTE — ED Notes (Signed)
Hourly rounding reveals patient awake in room. No complaints, stable, in no acute distress. Q15 minute rounds and monitoring via Security Cameras to continue. 

## 2019-08-13 NOTE — ED Notes (Signed)
Pt up to door. Pt soiled self. New scrubs and brief provided for pt. Pts pad in bed changed.

## 2019-08-13 NOTE — ED Notes (Signed)
Hourly rounding reveals patient asleep in room. No complaints, stable, in no acute distress. Q15 minute rounds and monitoring via [employer] to continue.

## 2019-08-13 NOTE — ED Notes (Signed)
Pt up to take meds and urinates on self and bed. Bed linens and pt changed and cleaned. Pts floor and commons area cleaned by this RN and EDT, Mickel Baas. EVS called to clean as well.

## 2019-08-13 NOTE — ED Notes (Signed)
IVC  PENDING  PLACEMENT  / IVC NEEDS TO  BE  RENEWED ON 08/14/19

## 2019-08-14 DIAGNOSIS — F29 Unspecified psychosis not due to a substance or known physiological condition: Secondary | ICD-10-CM | POA: Diagnosis not present

## 2019-08-14 NOTE — ED Notes (Signed)
Hourly rounding reveals patient sleeping in room. No complaints, stable, in no acute distress. Q15 minute rounds and monitoring via Security Cameras to continue. 

## 2019-08-14 NOTE — TOC Progression Note (Addendum)
Transition of Care [facility]) - Progression Note    Patient Details  Name: Peter Cox MRN: [id number] Date of Birth: 1960/09/12  Transition of Care [facility]) CM/SW [place], RN Phone Number: 08/14/2019, 11:27 AM  Clinical Narrative:    Damaris Schooner with Kasey @ [facility]-continues to be working for placement. No estimated date at this time. RN CM will outreach to alternate options as well. Sent email to Mr. Hinda Kehr asking for availability at alternate group homes.    Expected Discharge Plan: Memory Care    Expected Discharge Plan and Services Expected Discharge Plan: Memory Care       Living arrangements for the past 2 months: Single Family Home                                       Social Determinants of Health (SDOH) Interventions    Readmission Risk Interventions No flowsheet data found.

## 2019-08-14 NOTE — ED Notes (Signed)
Pt. Currently sleeping in [place] bed #3.

## 2019-08-14 NOTE — ED Notes (Signed)
Hourly rounding reveals patient awake in room after changing his pants and his bed for urination. No complaints, stable, in no acute distress. Q15 minute rounds and monitoring via [employer] to continue.

## 2019-08-14 NOTE — ED Provider Notes (Signed)
-----------------------------------------   5:49 AM on 08/14/2019 -----------------------------------------   Blood pressure 136/82, pulse 80, temperature 98.2 F (36.8 C), temperature source Oral, resp. rate 17, height 5\' 4"  (1.626 m), weight 65.3 kg, SpO2 100 %.  The patient is sleeping at this time.  There have been no acute events since the last update.  Awaiting disposition plan from Behavioral Medicine and/or Social Work team(s).   Paulette Blanch, MD 08/14/19 [phone]

## 2019-08-14 NOTE — ED Notes (Signed)
Hourly rounding reveals patient awake in room. No complaints, stable, in no acute distress. Q15 minute rounds and monitoring via Security Cameras to continue. 

## 2019-08-14 NOTE — ED Notes (Deleted)
Hourly rounding reveals patient sleeping in room. No complaints, stable, in no acute distress. Q15 minute rounds and monitoring via Security Cameras to continue. 

## 2019-08-14 NOTE — ED Notes (Signed)
He has talked with his sister on the phone

## 2019-08-15 DIAGNOSIS — F29 Unspecified psychosis not due to a substance or known physiological condition: Secondary | ICD-10-CM | POA: Diagnosis not present

## 2019-08-15 NOTE — ED Notes (Signed)
Pt. Up using bathroom, pt. Returned to room.

## 2019-08-15 NOTE — ED Notes (Signed)
IVC/ Pending Placement @ [facility] when bed comes available

## 2019-08-15 NOTE — ED Notes (Signed)
Pt. Up requesting bed to be changed and a change of scrubs.  Pt. Had urinated in bed.  Pt. Able to clean up and change into new set of scrubs.  Patients linens changed.  Pt. Returned to bed.

## 2019-08-15 NOTE — ED Provider Notes (Signed)
-----------------------------------------   6:02 AM on 08/15/2019 -----------------------------------------   Blood pressure 137/74, pulse 70, temperature 98.1 F (36.7 C), temperature source Oral, resp. rate 18, height 5\' 4"  (1.626 m), weight 65.3 kg, SpO2 99 %.  The patient had no acute events since last update.  Calm and cooperative at this time.  Disposition is pending per Psychiatry/Behavioral Medicine team recommendations.     Alfred Levins, [place], MD 08/15/19 [phone]

## 2019-08-15 NOTE — ED Notes (Signed)
Pt resting in bed, watching TV. Pts bed I dry at this time. No request by pt.

## 2019-08-16 DIAGNOSIS — F29 Unspecified psychosis not due to a substance or known physiological condition: Secondary | ICD-10-CM | POA: Diagnosis not present

## 2019-08-16 NOTE — ED Notes (Signed)
Pt to the door asking for linen change.  Pt given clean scrubs and washcloth and pt cleaned himself and changed scrubs in bathroom.  Linens changed.

## 2019-08-16 NOTE — ED Provider Notes (Signed)
-----------------------------------------   4:03 AM on 08/16/2019 -----------------------------------------   Blood pressure 133/72, pulse 65, temperature 98.2 F (36.8 C), temperature source Oral, resp. rate 16, height 5\' 4"  (1.626 m), weight 65.3 kg, SpO2 100 %.  The patient had no acute events since last update.  Calm and cooperative at this time.  Disposition is pending per Psychiatry/Behavioral Medicine team recommendations.     Alfred Levins, [place], MD 08/16/19 [phone]

## 2019-08-16 NOTE — ED Notes (Signed)
Pt ate 100% of lunch tray

## 2019-08-16 NOTE — ED Notes (Signed)
Pt urinated on self. Pt walks to nurses' station and gets new clothes and changes independently.

## 2019-08-16 NOTE — ED Notes (Signed)
Pt given meal tray. Pt eating currently.

## 2019-08-16 NOTE — ED Notes (Signed)
Pt ate 100% of meal tray 

## 2019-08-16 NOTE — TOC Progression Note (Signed)
Transition of Care [facility]) - Progression Note    Patient Details  Name: Peter Cox MRN: [id number] Date of Birth: 07/14/1960  Transition of Care [facility]) CM/SW [place], RN Phone Number: 08/16/2019, 1:57 PM  Clinical Narrative:    Damaris Schooner with [name] @ [facility] for potential placement. Patient will require COVID vaccine prior to admission to [facility]. RN CM will fax over [place] for review for possible placement as previous placement is experiencing delays in ability to accept.   Expected Discharge Plan: Memory Care    Expected Discharge Plan and Services Expected Discharge Plan: Memory Care       Living arrangements for the past 2 months: Single Family Home                                       Social Determinants of Health (SDOH) Interventions    Readmission Risk Interventions No flowsheet data found.

## 2019-08-16 NOTE — ED Notes (Signed)
Pt. Laying in bed watching tv.  Pt. Calm and cooperative.  Pt. Appears to be in a good mood. Pt. Has no questions or concerns at this time.

## 2019-08-16 NOTE — ED Notes (Signed)
IVC/  PENDING  PLACEMENT 

## 2019-08-17 DIAGNOSIS — F29 Unspecified psychosis not due to a substance or known physiological condition: Secondary | ICD-10-CM | POA: Diagnosis not present

## 2019-08-17 NOTE — ED Notes (Signed)
Another patient wandered in while this patient was eating in his bed. This patient remained calm and continued eating.

## 2019-08-17 NOTE — ED Notes (Signed)
Pt up walking in dayroom, asking for juice, pt provided orange juice, and breakfast tray arrived as well.  Pt reports sheets are dry at this time and does not need to be changed.  Pt ambulatory with steady gait.

## 2019-08-17 NOTE — ED Notes (Signed)
Report given to Sherry RN

## 2019-08-17 NOTE — ED Notes (Signed)
IVC/Pending Placement 

## 2019-08-17 NOTE — ED Notes (Signed)
Pt up to the door smiling. Pt is calm and cooperative. No needs expressed at this time.

## 2019-08-17 NOTE — ED Provider Notes (Signed)
-----------------------------------------   5:56 AM on 08/17/2019 -----------------------------------------   Blood pressure (!) 168/76, pulse (!) 106, temperature (!) 97.5 F (36.4 C), temperature source Oral, resp. rate 18, height 5\' 4"  (1.626 m), weight 65.3 kg, SpO2 100 %.  The patient is calm and cooperative at this time.  There have been no acute events since the last update.  Awaiting disposition plan from Behavioral Medicine and/or Social Work team(s).   Paulette Blanch, MD 08/17/19 [phone]

## 2019-08-18 DIAGNOSIS — F29 Unspecified psychosis not due to a substance or known physiological condition: Secondary | ICD-10-CM | POA: Diagnosis not present

## 2019-08-18 NOTE — ED Provider Notes (Signed)
1:13 AM   Blood pressure 127/81, pulse (!) 58, temperature 98 F (36.7 C), temperature source Oral, resp. rate 16, height 5\' 4"  (1.626 m), weight 65.3 kg, SpO2 100 %.  There have been no acute events since the last update.  Awaiting disposition plan from Behavioral Medicine team.   Vanessa Leota, MD 08/18/19 [phone]

## 2019-08-18 NOTE — ED Notes (Signed)
Report to include Situation, Background, Assessment, and Recommendations received from [facility]. Patient alert, warm and dry, in no acute distress. Patient denies SI, HI, AVH and pain. Patient made aware of Q15 minute rounds and Engineer, drilling presence for their safety. Patient instructed to come to me with needs or concerns.

## 2019-08-18 NOTE — ED Notes (Signed)
Hourly rounding reveals patient sleeping in hall bed. No complaints, stable, in no acute distress. Q15 minute rounds and monitoring via Rover and Officer to continue.  

## 2019-08-18 NOTE — ED Notes (Signed)
IVC/Pending Placement 

## 2019-08-18 NOTE — ED Notes (Signed)
Breakfast tray given. °

## 2019-08-18 NOTE — ED Notes (Signed)
Pt went to restroom. Able to ambulate without assistance

## 2019-08-18 NOTE — ED Notes (Signed)
Pt given supper tray.

## 2019-08-18 NOTE — ED Notes (Signed)
Lunch tray given. 

## 2019-08-18 NOTE — ED Notes (Signed)
Pt went to restroom. Ambulated with no assistance

## 2019-08-19 DIAGNOSIS — F29 Unspecified psychosis not due to a substance or known physiological condition: Secondary | ICD-10-CM | POA: Diagnosis not present

## 2019-08-19 NOTE — ED Notes (Signed)
Hourly rounding reveals patient sleeping in room. No complaints, stable, in no acute distress. Q15 minute rounds and monitoring via Security Cameras to continue. 

## 2019-08-19 NOTE — ED Notes (Signed)
Hourly rounding reveals patient sleeping in hall bed. No complaints, stable, in no acute distress. Q15 minute rounds and monitoring via Rover and Officer to continue.  

## 2019-08-19 NOTE — ED Notes (Signed)
IVC/  PENDING  PLACEMENT 

## 2019-08-19 NOTE — ED Notes (Addendum)
Hourly rounding reveals patient sleeping in hall bed. No complaints, stable, in no acute distress. Q15 minute rounds and monitoring via Rover and Officer to continue.  

## 2019-08-19 NOTE — ED Notes (Signed)
Hourly rounding reveals patient awake in hall bed. No complaints, stable, in no acute distress. Q15 minute rounds and monitoring via Rover and Officer to continue.  

## 2019-08-19 NOTE — ED Notes (Signed)
Pt. Transferred to [place] from ED to room 3 after screening for contraband. Report to include Situation, Background, Assessment and Recommendations from [employer]. Pt. Oriented to unit including Q15 minute rounds as well as the security cameras for their protection. Patient is alert and oriented, warm and dry in no acute distress. Patient denies SI, HI, and AVH. Pt. Encouraged to let me know if needs arise.

## 2019-08-19 NOTE — ED Provider Notes (Signed)
-----------------------------------------   6:25 AM on 08/19/2019 -----------------------------------------   Blood pressure (!) 157/74, pulse 66, temperature 98.6 F (37 C), temperature source Oral, resp. rate 18, height 5\' 4"  (1.626 m), weight 65.3 kg, SpO2 97 %.  The patient is sleeping at this time.  There have been no acute events since the last update.  Awaiting disposition plan from Behavioral Medicine and/or Social Work team(s).   Paulette Blanch, MD 08/19/19 [phone]

## 2019-08-19 NOTE — ED Notes (Signed)
Pt asked if he wanted night time snack. He asked for a sandwich tray and coke and extra blanket. Pt was provided these things. Pt seems happy to be back over in this unit. Pt resting in bed and is calm.  lw edt

## 2019-08-20 DIAGNOSIS — F29 Unspecified psychosis not due to a substance or known physiological condition: Secondary | ICD-10-CM | POA: Diagnosis not present

## 2019-08-20 NOTE — ED Notes (Signed)
Patient up and about in day room, awaiting shower supplies and change of clothes. Bed linen changed

## 2019-08-20 NOTE — ED Provider Notes (Signed)
-----------------------------------------   1:57 AM on 08/20/2019 -----------------------------------------   Blood pressure (!) 165/84, pulse (!) 59, temperature 97.7 F (36.5 C), temperature source Oral, resp. rate 16, height 5\' 4"  (1.626 m), weight 65.3 kg, SpO2 100 %.  The patient is calm and cooperative at this time.  There have been no acute events since the last update.  Awaiting disposition plan from Behavioral Medicine and/or Social Work team(s).    Delman Kitten, MD 08/20/19 [phone]

## 2019-08-20 NOTE — ED Notes (Signed)
IVC/  PENDING  PLACEMENT 

## 2019-08-20 NOTE — ED Notes (Signed)
Dinner tray provided

## 2019-08-20 NOTE — ED Notes (Signed)
Hourly rounding reveals patient asleep in room. No complaints, stable, in no acute distress. Q15 minute rounds and monitoring via [employer] to continue.

## 2019-08-20 NOTE — ED Notes (Signed)
Hourly rounding reveals patient sleeping in room. No complaints, stable, in no acute distress. Q15 minute rounds and monitoring via Security Cameras to continue. 

## 2019-08-20 NOTE — ED Notes (Signed)
Laying in bed watching TV. Safety maintained.

## 2019-08-20 NOTE — ED Notes (Signed)
Report to include Situation, Background, Assessment, and Recommendations received from [facility]. Patient alert and oriented, warm and dry, in no acute distress. Patient denies SI, HI, AVH and pain. Patient made aware of Q15 minute rounds and security cameras for their safety. Patient instructed to come to me with needs or concerns.

## 2019-08-20 NOTE — ED Notes (Signed)
Patient up in bed watching tv. Breakfast provided.safety maintained.

## 2019-08-20 NOTE — ED Notes (Signed)
Patient oob to nursing station with steady gait went to bathroom with out difficulties.

## 2019-08-21 DIAGNOSIS — F29 Unspecified psychosis not due to a substance or known physiological condition: Secondary | ICD-10-CM | POA: Diagnosis not present

## 2019-08-21 NOTE — Progress Notes (Signed)
PT Cancellation Note  Patient Details Name: Peter Cox MRN: [id number] DOB: 1961/02/16   Cancelled Treatment:    Reason Eval/Treat Not Completed: Other (comment);PT screened, no needs identified, will sign off  PT entered room, pt alert, oriented to name, reported several times during session "I want to go home". Pt was able to perform bed and sit <> stand transfers independently, don and doff shoes independently, and ambulated >55ft with supervision. Pt did exhibit shuffling gait but overall, patient demonstrated baseline level of functioning, no further acute PT needs indicated. PT to sign off. Please reconsult PT if pt status changes or acute needs are identified.     Lieutenant Diego PT, DPT 3:53 PM,08/21/19

## 2019-08-21 NOTE — ED Notes (Signed)
Patient completed PT today no further eval needed unless an acute change is demonstrated. Awaiting SW plan for disposition.

## 2019-08-21 NOTE — ED Notes (Signed)
Hourly rounding reveals patient sleeping in room. No complaints, stable, in no acute distress. Q15 minute rounds and monitoring via Security Cameras to continue. 

## 2019-08-21 NOTE — ED Notes (Signed)
Dinner tray not received, dietary called for his tray will be sending one up soon.

## 2019-08-21 NOTE — TOC Progression Note (Addendum)
Transition of Care [facility]) - Progression Note    Patient Details  Name: Peter Cox MRN: [id number] Date of Birth: 1960-07-06  Transition of Care [facility]) CM/SW [place], RN Phone Number: 08/21/2019, 1:06 PM  Clinical Narrative:    Outreach to Kase @ [facility] regarding potential placement. Bed is no longer available. RN CM will research other options and request PT eval for possible therapy needs.    Expected Discharge Plan: Memory Care    Expected Discharge Plan and Services Expected Discharge Plan: Memory Care       Living arrangements for the past 2 months: Single Family Home                                       Social Determinants of Health (SDOH) Interventions    Readmission Risk Interventions No flowsheet data found.

## 2019-08-21 NOTE — TOC Progression Note (Signed)
Transition of Care [facility]) - Progression Note    Patient Details  Name: Peter Cox MRN: [id number] Date of Birth: 11-05-1960  Transition of Care [facility]) CM/SW [place], RN Phone Number: 08/21/2019, 2:00 PM  Clinical Narrative:    RN CM called Melissa @ [facility] to discuss potential placement. Patient will need a skilled need for placement to [facility]. RN CM outreach to [employer] @ [place] [phone] for possible memory care placement. Will fax Fl2 for review to [phone]. Peggy states facility is currently under admission restrictions from the state however they are expecting to resume admissions next week.    Expected Discharge Plan: Memory Care    Expected Discharge Plan and Services Expected Discharge Plan: Memory Care       Living arrangements for the past 2 months: Single Family Home                                       Social Determinants of Health (SDOH) Interventions    Readmission Risk Interventions No flowsheet data found.

## 2019-08-21 NOTE — ED Provider Notes (Signed)
-----------------------------------------   3:10 AM on 08/21/2019 -----------------------------------------   Blood pressure (!) 147/80, pulse 75, temperature (!) 97.5 F (36.4 C), temperature source Oral, resp. rate 16, height 1.626 m (5\' 4" ), weight 65.3 kg, SpO2 100 %.  The patient is calm and cooperative at this time.  There have been no acute events since the last update.  Awaiting disposition plan from Behavioral Medicine and/or Social Work team(s).   Hinda Kehr, MD 08/21/19 [phone]

## 2019-08-21 NOTE — ED Notes (Signed)
Breakfast provided.

## 2019-08-21 NOTE — ED Notes (Signed)
Patient ambulated with out difficulties into day room. Sitting in day room watching TV. Safety maintained.

## 2019-08-21 NOTE — ED Notes (Signed)
Patient incontinent at 1300, bed linen changed, patient showered and changed into clean dry clothes.

## 2019-08-21 NOTE — ED Notes (Signed)
Patient up early ambulated to bathroom without assist. Patient with shuffle and steady gait. Vss, safety maintained. Will continue to monitor.

## 2019-08-22 DIAGNOSIS — F29 Unspecified psychosis not due to a substance or known physiological condition: Secondary | ICD-10-CM | POA: Diagnosis not present

## 2019-08-22 NOTE — ED Notes (Signed)
Pt escorted from quad to [facility] by wheelchair.  Ambulatory with slow steady gait into room.  Patient introduced to his room and common area, shown where the bathroom is.  Offered snack, drink, and blankets.  Patient is resting calmly in bed at this time and denies other needs at this time.  Will continue to round on patient.

## 2019-08-22 NOTE — ED Notes (Signed)
Dinner tray provided

## 2019-08-22 NOTE — ED Notes (Signed)
Patient encouraged to try to use restroom, pt up and ambulated to urinate and then back to bed.

## 2019-08-22 NOTE — ED Notes (Signed)
Patient encouraged to use restroom at this time, patient up with slow steady gait to throw away trash and use bathroom.  Patient back to bed and resting under covers.

## 2019-08-22 NOTE — ED Notes (Signed)
Patient ambulated to bathroom by self with steady gait and then back to bed.

## 2019-08-22 NOTE — ED Provider Notes (Signed)
-----------------------------------------   7:02 AM on 08/22/2019 -----------------------------------------   BP (!) 151/84 (BP Location: Left Arm)   Pulse 83   Temp 98.1 F (36.7 C) (Oral)   Resp 17   Ht 1.626 m (5\' 4" )   Wt 65.3 kg   SpO2 99%   BMI 24.72 kg/m   No acute events overnight. Vitals reviewed. Patient remains medically cleared.  Disposition is pending per Psychiatry/Behavioral Medicine team recommendations.    Lavonia Drafts, MD 08/22/19 [phone]

## 2019-08-22 NOTE — ED Notes (Signed)
Patient in shower at this time. 

## 2019-08-23 DIAGNOSIS — F29 Unspecified psychosis not due to a substance or known physiological condition: Secondary | ICD-10-CM | POA: Diagnosis not present

## 2019-08-23 NOTE — ED Notes (Signed)
Pt given dinner tray.

## 2019-08-23 NOTE — ED Notes (Signed)
Pt given hot food tray

## 2019-08-23 NOTE — ED Notes (Signed)
Pt turned lights off in room at this time, lying quietly in bed and denies needs.

## 2019-08-23 NOTE — TOC Progression Note (Signed)
Transition of Care [facility]) - Progression Note    Patient Details  Name: Peter Cox MRN: [id number] Date of Birth: [birth year]  Transition of Care [facility]) CM/SW Contact  Anselm Pancoast, RN Phone Number: 08/23/2019, 10:51 AM  Clinical Narrative:    Spoke to Peggy @ [facility]-confirmed fax had not been received as fax had been broken but was now fixed. Refaxed information to [phone] for review.    Expected Discharge Plan: Memory Care    Expected Discharge Plan and Services Expected Discharge Plan: Memory Care       Living arrangements for the past 2 months: Single Family Home                                       Social Determinants of Health (SDOH) Interventions    Readmission Risk Interventions No flowsheet data found.

## 2019-08-23 NOTE — ED Notes (Signed)
This RN checking to see if patient needed to use bathroom.  Patient reports already using bathroom on self, urination and BM.  Patient taken to bathroom and cleaned and changed into fresh clothes by Alissa, EDT and bed linens changed by this RN.  Patient back to bed and lying down, denies other needs at this time.  Will continue to round and monitor patient.

## 2019-08-23 NOTE — ED Provider Notes (Signed)
-----------------------------------------   5:57 AM on 08/23/2019 -----------------------------------------   Blood pressure (!) 171/76, pulse 72, temperature 97.6 F (36.4 C), temperature source Oral, resp. rate 16, height 5\' 4"  (1.626 m), weight 65.3 kg, SpO2 100 %.  The patient is sleeping at this time.  There have been no acute events since the last update.  Awaiting disposition plan from Behavioral Medicine and/or Social Work team(s).   Paulette Blanch, MD 08/23/19 [phone]

## 2019-08-23 NOTE — ED Notes (Signed)
Pt given sandwich tray and orange juice. Encouraged to go to the bathroom but says he does not need to at this time.

## 2019-08-23 NOTE — ED Notes (Signed)
VOL/ Pending Placement/ Moved to BHU-3

## 2019-08-24 DIAGNOSIS — F29 Unspecified psychosis not due to a substance or known physiological condition: Secondary | ICD-10-CM | POA: Diagnosis not present

## 2019-08-24 NOTE — ED Notes (Signed)
Pt given breakfast tray

## 2019-08-24 NOTE — ED Notes (Signed)
Pt. Laying in bed watching tv.  Pt. Calm and cooperative.  Pt. Has no questions or concerns at this time and appears to be in a good mood.

## 2019-08-24 NOTE — ED Notes (Signed)
Vol pending placementt

## 2019-08-24 NOTE — ED Notes (Signed)
Pt asking to take a shower after an episode of incontinence.  Clean scrubs and suplies given to pt.  Linen changed and bed wiped down.

## 2019-08-24 NOTE — ED Notes (Signed)
Pt given dinner tray.

## 2019-08-24 NOTE — ED Provider Notes (Signed)
-----------------------------------------   5:53 AM on 08/24/2019 -----------------------------------------   Blood pressure (!) 173/89, pulse 74, temperature (!) 97.5 F (36.4 C), temperature source Oral, resp. rate 18, height 5\' 4"  (1.626 m), weight 65.3 kg, SpO2 100 %.  The patient is sleeping at this time.  There have been no acute events since the last update.  Awaiting disposition plan from Behavioral Medicine and/or Social Work team(s).   Paulette Blanch, MD 08/24/19 [phone]

## 2019-08-24 NOTE — ED Notes (Signed)
Pt. Up using bathroom, pt. Returned to room with steady gait.

## 2019-08-24 NOTE — ED Notes (Signed)
Vol pending SW placement

## 2019-08-25 DIAGNOSIS — F29 Unspecified psychosis not due to a substance or known physiological condition: Secondary | ICD-10-CM | POA: Diagnosis not present

## 2019-08-25 NOTE — ED Notes (Signed)
Pt's daughter here to visit.

## 2019-08-25 NOTE — ED Notes (Signed)
Pt's daughter called to ask about visitation. RN spoke with charge nurse and pt will be allowed to have a 15 minute visit during visitation hours.

## 2019-08-25 NOTE — ED Notes (Signed)
Pt given breakfast tray

## 2019-08-25 NOTE — ED Notes (Signed)
Pt given clean scrubs and supplies to take a shower.

## 2019-08-25 NOTE — ED Notes (Signed)
Pt given lunch tray.

## 2019-08-25 NOTE — ED Notes (Signed)
Hourly rounding reveals patient awake in room. No complaints, stable, in no acute distress. Q15 minute rounds and monitoring via Security Cameras to continue. 

## 2019-08-25 NOTE — ED Notes (Signed)
Hourly rounding reveals patient sleeping in room. No complaints, stable, in no acute distress. Q15 minute rounds and monitoring via Security Cameras to continue. 

## 2019-08-25 NOTE — ED Provider Notes (Signed)
-----------------------------------------   5:29 AM on 08/25/2019 -----------------------------------------   Blood pressure (!) 142/75, pulse (!) 57, temperature 98.6 F (37 C), temperature source Oral, resp. rate 18, height 5\' 4"  (1.626 m), weight 65.3 kg, SpO2 100 %.  The patient had no acute events since last update.  Calm and cooperative at this time.  Disposition is pending per Psychiatry/Behavioral Medicine team recommendations.     Alfred Levins, [place], MD 08/25/19 [phone]

## 2019-08-25 NOTE — ED Notes (Signed)
Pt given dinner tray.

## 2019-08-25 NOTE — ED Notes (Signed)
Report to include Situation, Background, Assessment, and Recommendations received from Amy B. RN. Patient alert, warm and dry, in no acute distress. Patient denies SI, HI, AVH and pain. Patient made aware of Q15 minute rounds and security cameras for their safety. Patient instructed to come to me with needs or concerns.

## 2019-08-25 NOTE — ED Notes (Signed)
Hourly rounding reveals patient in room. No complaints, stable, in no acute distress. Q15 minute rounds and monitoring via Security Cameras to continue. 

## 2019-08-25 NOTE — ED Notes (Signed)
Pt. Up using bathroom, pt. Returned to room with steady gait.

## 2019-08-26 DIAGNOSIS — F29 Unspecified psychosis not due to a substance or known physiological condition: Secondary | ICD-10-CM | POA: Diagnosis not present

## 2019-08-26 NOTE — ED Notes (Signed)
Hourly rounding reveals patient sleeping in room. No complaints, stable, in no acute distress. Q15 minute rounds and monitoring via Security Cameras to continue. 

## 2019-08-26 NOTE — ED Notes (Signed)
Hourly rounding reveals patient in hall. No complaints, stable, in no acute distress. Q15 minute rounds and monitoring via [employer] to continue.

## 2019-08-26 NOTE — ED Notes (Signed)
Hourly rounding reveals patient awake in room. No complaints, stable, in no acute distress. Q15 minute rounds and monitoring via Security Cameras to continue. 

## 2019-08-26 NOTE — ED Notes (Signed)
Patient is eating lunch, has good appetite, no signs of distress, will continue to monitor.

## 2019-08-26 NOTE — ED Provider Notes (Signed)
-----------------------------------------   6:44 AM on 08/26/2019 -----------------------------------------   Blood pressure (!) 142/72, pulse 68, temperature 98.5 F (36.9 C), temperature source Oral, resp. rate 16, height 1.626 m (5\' 4" ), weight 65.3 kg, SpO2 99 %.  The patient is calm and cooperative at this time.  There have been no acute events since the last update.  Awaiting disposition plan from Behavioral Medicine and/or Social Work team(s).   Gregor Hams, MD 08/26/19 [phone]

## 2019-08-26 NOTE — ED Notes (Signed)
Pt wed, pt changed into clean dry brief.

## 2019-08-26 NOTE — ED Notes (Signed)
Report to include Situation, Background, Assessment, and Recommendations received from [facility]. Patient alert, warm and dry, in no acute distress. Patient denies SI, HI, AVH and pain. Patient made aware of Q15 minute rounds and security cameras for their safety. Patient instructed to come to me with needs or concerns.

## 2019-08-26 NOTE — ED Notes (Signed)
Pt given breakfast tray

## 2019-08-26 NOTE — ED Notes (Signed)
Pt given another blanket per request.

## 2019-08-26 NOTE — ED Notes (Signed)
Pt wet. Assisted pt with shower, clean burgundy scrubs and brief put on.

## 2019-08-27 DIAGNOSIS — F29 Unspecified psychosis not due to a substance or known physiological condition: Secondary | ICD-10-CM | POA: Diagnosis not present

## 2019-08-27 NOTE — ED Notes (Signed)
Hourly rounding reveals patient awake in room. No complaints, stable, in no acute distress. Q15 minute rounds and monitoring via Security Cameras to continue. 

## 2019-08-27 NOTE — ED Notes (Signed)
Hourly rounding reveals patient sleeping in room. No complaints, stable, in no acute distress. Q15 minute rounds and monitoring via Security Cameras to continue. 

## 2019-08-27 NOTE — ED Notes (Signed)
VOL  PENDING  PLACEMENT 

## 2019-08-27 NOTE — ED Notes (Signed)
assisted him with stripping and putting new sheets on his bed  - incontinent pad placed face down on mattress and one up on top of sheet

## 2019-08-27 NOTE — ED Notes (Signed)
Pt given dinner tray.

## 2019-08-27 NOTE — ED Notes (Signed)
Report to include Situation, Background, Assessment, and Recommendations received from [facility]. Patient alert, warm and dry, in no acute distress. Patient denies SI, HI, AVH and pain. Patient made aware of Q15 minute rounds and security cameras for their safety. Patient instructed to come to me with needs or concerns.

## 2019-08-27 NOTE — ED Notes (Signed)
Pt wet, pt changed into clean dry brief and clean dry scrub pants.

## 2019-08-27 NOTE — ED Notes (Signed)
Pt received sandwich tray, ice cream and coke for bed time snack. Pt comfortable and resting at this time.  lw edt

## 2019-08-27 NOTE — ED Notes (Signed)
Patient in shower at this time. 

## 2019-08-27 NOTE — TOC Progression Note (Addendum)
Transition of Care [facility]) - Progression Note    Patient Details  Name: Peter Cox MRN: [id number] Date of Birth: Apr 02, 1961  Transition of Care [facility]) CM/SW [place], RN Phone Number: 08/27/2019, 10:57 AM  Clinical Narrative:    Damaris Schooner with Scot Jun from [facility] regarding possible memory care placement. Availability in [facility] facility. Confirmed patient information and facility will review for potential placement.   Spoke with [facility] [phone] regarding previous request for placement. Confirmed [facility] is willing to admit patient but are waiting on the state to restore admitting privilieges. RN CM will outreach later in the week for follow up.    Expected Discharge Plan: Memory Care    Expected Discharge Plan and Services Expected Discharge Plan: Memory Care       Living arrangements for the past 2 months: Single Family Home                                       Social Determinants of Health (SDOH) Interventions    Readmission Risk Interventions No flowsheet data found.

## 2019-08-28 DIAGNOSIS — F29 Unspecified psychosis not due to a substance or known physiological condition: Secondary | ICD-10-CM | POA: Diagnosis not present

## 2019-08-28 NOTE — ED Notes (Signed)
Pt. Alert laying on bed watching tv.  Pt. Calm and cooperative and appears to be in a good mood.  Pt. Has no concerns or questions at this time.

## 2019-08-28 NOTE — ED Notes (Signed)
Pt ambulated to bathroom 

## 2019-08-28 NOTE — ED Notes (Signed)
Hourly rounding reveals patient sleeping in room. No complaints, stable, in no acute distress. Q15 minute rounds and monitoring via Security Cameras to continue. 

## 2019-08-28 NOTE — TOC Progression Note (Signed)
Transition of Care [facility]) - Progression Note    Patient Details  Name: Peter Cox MRN: [id number] Date of Birth: May 27, 1961  Transition of Care [facility]) CM/SW [place], RN Phone Number: 08/28/2019, 2:59 PM  Clinical Narrative:    Spoke with Dianne @ [phone] discussed possible bed availability later this week at [facility]. Working on Media planner with facility. Dianne advised she would follow up later this week.    Expected Discharge Plan: Memory Care    Expected Discharge Plan and Services Expected Discharge Plan: Memory Care       Living arrangements for the past 2 months: Single Family Home                                       Social Determinants of Health (SDOH) Interventions    Readmission Risk Interventions No flowsheet data found.

## 2019-08-28 NOTE — ED Notes (Signed)
Pt. Encouraged to use rest room before going to bed.

## 2019-08-28 NOTE — ED Provider Notes (Signed)
-----------------------------------------   7:45 AM on 08/28/2019 -----------------------------------------   Blood pressure (!) 148/77, pulse 70, temperature 98.1 F (36.7 C), temperature source Oral, resp. rate 18, height 5\' 4"  (1.626 m), weight 65.3 kg, SpO2 97 %.  The patient is calm and cooperative at this time.  There have been no acute events since the last update.  Awaiting disposition plan from Behavioral Medicine and/or Social Work team(s).    Delman Kitten, MD 08/28/19 [phone]

## 2019-08-28 NOTE — ED Notes (Signed)
VOL/Pending Placement 

## 2019-08-29 DIAGNOSIS — F29 Unspecified psychosis not due to a substance or known physiological condition: Secondary | ICD-10-CM | POA: Diagnosis not present

## 2019-08-29 NOTE — ED Notes (Signed)
Pt. Up using bathroom, pt. Returned to room with steady gait.

## 2019-08-29 NOTE — ED Provider Notes (Signed)
-----------------------------------------   2:47 AM on 08/29/2019 -----------------------------------------   Blood pressure (!) 149/62, pulse 60, temperature 98.2 F (36.8 C), temperature source Oral, resp. rate 17, height 5\' 4"  (1.626 m), weight 65.3 kg, SpO2 99 %.  The patient is calm and cooperative at this time.  There have been no acute events since the last update.  Awaiting disposition plan from Behavioral Medicine and/or Social Work team(s).    Merlyn Lot, MD 08/29/19 [phone]

## 2019-08-29 NOTE — ED Notes (Signed)
VOL/Pending Placement 

## 2019-08-29 NOTE — ED Notes (Signed)
Pt given breakfast tray

## 2019-08-29 NOTE — ED Notes (Signed)
Dinner tray provided

## 2019-08-29 NOTE — ED Notes (Signed)
Patient in bed watching TV gets up out of bed and walks to bathroom without difficulties. Safety maintained will continue to monitor.

## 2019-08-29 NOTE — ED Notes (Addendum)
Patient resting comfortably in bed.

## 2019-08-29 NOTE — ED Notes (Signed)
Lunch provided.

## 2019-08-29 NOTE — ED Notes (Signed)
Pt asked for shower. Given clean scrubs, brief and shower supplies. Assisted pt with dressing. Put fresh linens on bed and pad.

## 2019-08-29 NOTE — ED Notes (Signed)
assumed care of patient, patient up early ambulated to bathroom with steady gait. As per prior nurse patient has been getting up throughout the night and using the bathroom. Patient denied any issues or concerns this morning. Awaiting am hygiene and breakfast. Will continue to monitor. Safety maintained/.

## 2019-08-29 NOTE — ED Notes (Signed)
Patient to bathroom with steady gait and back to his room

## 2019-08-29 NOTE — ED Notes (Signed)
Patient showered , house keeping called, room mopped and linen on bed was changed.

## 2019-08-29 NOTE — ED Notes (Signed)
Patient up to nursing station to ask for beverage. Beverage provided. Patient ambulated with steady gait back to room.

## 2019-08-30 DIAGNOSIS — F29 Unspecified psychosis not due to a substance or known physiological condition: Secondary | ICD-10-CM | POA: Diagnosis not present

## 2019-08-30 NOTE — ED Notes (Signed)
Pt given meal tray.

## 2019-08-30 NOTE — ED Notes (Signed)
VOL  PENDING  PLACEMENT 

## 2019-08-30 NOTE — ED Notes (Signed)
Pt. Up using bathroom, pt. Requested and was given cup of soda.

## 2019-08-30 NOTE — TOC Progression Note (Signed)
Transition of Care [facility]) - Progression Note    Patient Details  Name: Peter Cox MRN: [id number] Date of Birth: 1961/04/28  Transition of Care [facility]) CM/SW Contact  Anselm Pancoast, RN Phone Number: 08/30/2019, 2:52 PM  Clinical Narrative:     Spoke with daughter and updated on current bed search. Daughter concerned about patients toenails and fingernails needing trimmed. Updated that consult had been placed however procedure was determined to be outpatient and could not be completed at this time. Daughter was concerned about cough patient was experiencing during her visit over the weekend. Writer spoke with nurse who advised cough had resolved.   Expected Discharge Plan: Memory Care    Expected Discharge Plan and Services Expected Discharge Plan: Memory Care       Living arrangements for the past 2 months: Single Family Home                                       Social Determinants of Health (SDOH) Interventions    Readmission Risk Interventions No flowsheet data found.

## 2019-08-30 NOTE — ED Notes (Signed)
Pt. Laying in bed watching tv.  Pt. Has no concerns or questions at this time.

## 2019-08-30 NOTE — ED Provider Notes (Signed)
-----------------------------------------   6:04 AM on 08/30/2019 -----------------------------------------   Blood pressure (!) 155/91, pulse 61, temperature 98.4 F (36.9 C), resp. rate 16, height 5\' 4"  (1.626 m), weight 65.3 kg, SpO2 100 %.  The patient is calm and cooperative at this time.  There have been no acute events since the last update.  Awaiting disposition plan from Behavioral Medicine and/or Social Work team(s).   Carrie Mew, MD 08/30/19 [phone]

## 2019-08-31 DIAGNOSIS — F29 Unspecified psychosis not due to a substance or known physiological condition: Secondary | ICD-10-CM | POA: Diagnosis not present

## 2019-08-31 NOTE — ED Notes (Signed)
Pt urinated in bed. Changed into new clothes and new sheets given.

## 2019-08-31 NOTE — ED Notes (Signed)
Pt ate 98% of breakfast tray.  Pt calm, resting and waiting for shower supplies.   lw edt

## 2019-08-31 NOTE — ED Notes (Signed)
Pt received lunch tray   lw edt

## 2019-08-31 NOTE — ED Notes (Signed)
Pt. Up using bathroom, returned to room with steady gait.

## 2019-08-31 NOTE — ED Notes (Signed)
Pt given meal tray.

## 2019-08-31 NOTE — ED Notes (Signed)
Per Nail and Foot care CE skill and Marya Amsler, Surveyor, quantity, this RN clipped and filed pt's nails and toenails. Clipped to edge of finger and filed down. Pt denied any discomfort during procedure.

## 2019-08-31 NOTE — ED Notes (Signed)
Pt showering and changing into clean scrubs.  Clean bedding and brief.   lw edt

## 2019-08-31 NOTE — ED Provider Notes (Signed)
-----------------------------------------   1:18 AM on 08/31/2019 -----------------------------------------  Blood pressure (!) 144/80, pulse (!) 56, temperature 97.7 F (36.5 C), temperature source Oral, resp. rate 16, height 5\' 4"  (1.626 m), weight 65.3 kg, SpO2 100 %.  The patient is calm and cooperative at this time.  There have been no acute events since the last update.  Awaiting disposition plan from Behavioral Medicine team.   Blake Divine, MD 08/31/19 [phone]

## 2019-08-31 NOTE — ED Notes (Addendum)
Pt's sister called to talk to pt. Sister states that someone told them it was okay for 3-4 people to come see pt on his birthday [birth year] and bring him a cupcake. Per Marya Amsler, Surveyor, quantity only one visitor is allowed and no outside food. This RN will inform sister.

## 2019-09-01 DIAGNOSIS — F29 Unspecified psychosis not due to a substance or known physiological condition: Secondary | ICD-10-CM | POA: Diagnosis not present

## 2019-09-01 NOTE — ED Notes (Addendum)
Pt given lunch tray and drink.  Pt input 95% of lunch tray.   lw edt

## 2019-09-01 NOTE — ED Notes (Signed)
Pt showered and given clean scrubs and brief   lw edt

## 2019-09-01 NOTE — ED Notes (Signed)
Pt received hot dinner tray and beverage.  Pt ate 90% of meal Pt calm and resting in room at this time.   lw edt

## 2019-09-01 NOTE — ED Provider Notes (Signed)
-----------------------------------------   5:59 AM on 09/01/2019 -----------------------------------------   Blood pressure (!) 133/92, pulse 60, temperature 97.8 F (36.6 C), temperature source Oral, resp. rate 16, height 1.626 m (5\' 4" ), weight 65.3 kg, SpO2 99 %.  The patient is calm and cooperative at this time.  There have been no acute events since the last update.  Awaiting disposition plan from Behavioral Medicine and/or Social Work team(s).   Hinda Kehr, MD 09/01/19 [phone]

## 2019-09-01 NOTE — ED Notes (Signed)
Pt up to bathroom after being incont of urine. Pt given clean scrubs and bed changed.

## 2019-09-02 DIAGNOSIS — F29 Unspecified psychosis not due to a substance or known physiological condition: Secondary | ICD-10-CM | POA: Diagnosis not present

## 2019-09-02 NOTE — ED Notes (Signed)
Hourly rounding reveals patient awake in room. No complaints, stable, in no acute distress. Q15 minute rounds and monitoring via Security Cameras to continue. 

## 2019-09-02 NOTE — TOC Progression Note (Addendum)
Transition of Care [facility]) - Progression Note    Patient Details  Name: HARSHAN KEARLEY MRN: [id number] Date of Birth: 1961-01-03  Transition of Care [facility]) CM/SW [place], RN Phone Number: 09/02/2019, 3:36 PM  Clinical Narrative:    Call to [phone] following up on previous bed request. Left voicemail message. Call to [facility] [phone] states they continue to wait for clearance to begin admitting patients and are anticipated it should open any day but have no way to know how soon that will be. Advised patient remains approved on their list.    Expected Discharge Plan: Memory Care    Expected Discharge Plan and Services Expected Discharge Plan: Memory Care       Living arrangements for the past 2 months: Single Family Home                                       Social Determinants of Health (SDOH) Interventions    Readmission Risk Interventions No flowsheet data found.

## 2019-09-02 NOTE — ED Notes (Signed)
Hourly rounding reveals patient sleeping in room. No complaints, stable, in no acute distress. Q15 minute rounds and monitoring via Security Cameras to continue. 

## 2019-09-02 NOTE — ED Notes (Signed)
Patient voided in bed, nurse and tech cleaned patient and changed his linens

## 2019-09-02 NOTE — ED Provider Notes (Signed)
-----------------------------------------   3:18 AM on 09/02/2019 -----------------------------------------  Blood pressure 140/82, pulse 80, temperature 97.8 F (36.6 C), temperature source Oral, resp. rate 16, height 5\' 4"  (1.626 m), weight 65.3 kg, SpO2 100 %.  The patient is calm and cooperative at this time.  There have been no acute events since the last update.  Awaiting disposition plan from Behavioral Medicine team.   Blake Divine, MD 09/02/19 [phone]

## 2019-09-02 NOTE — ED Notes (Signed)
Hourly rounding reveals patient in day room. No complaints, stable, in no acute distress. Q15 minute rounds and monitoring via Security Cameras to continue. 

## 2019-09-02 NOTE — ED Notes (Signed)
VOL/Pending Placement 

## 2019-09-02 NOTE — ED Notes (Signed)
Report to include Situation, Background, Assessment, and Recommendations received from Jadeka RN. Patient alert and oriented, warm and dry, in no acute distress. Patient denies SI, HI, AVH and pain. Patient made aware of Q15 minute rounds and security cameras for their safety. Patient instructed to come to me with needs or concerns. 

## 2019-09-02 NOTE — ED Notes (Signed)
Ambulated to bathroom

## 2019-09-03 DIAGNOSIS — F29 Unspecified psychosis not due to a substance or known physiological condition: Secondary | ICD-10-CM | POA: Diagnosis not present

## 2019-09-03 NOTE — ED Notes (Signed)
Pt given clean scrubs and supplies to take a shower.

## 2019-09-03 NOTE — ED Notes (Signed)
Hourly rounding reveals patient sleeping in room. No complaints, stable, in no acute distress. Q15 minute rounds and monitoring via Security Cameras to continue. 

## 2019-09-03 NOTE — ED Notes (Signed)
Meal tray given 

## 2019-09-03 NOTE — ED Notes (Signed)
Hourly rounding reveals patient awake in room. No complaints, stable, in no acute distress. Q15 minute rounds and monitoring via Security Cameras to continue. 

## 2019-09-03 NOTE — ED Notes (Signed)
Hourly rounding reveals patient asleep in room. No complaints, stable, in no acute distress. Q15 minute rounds and monitoring via [employer] to continue.

## 2019-09-03 NOTE — ED Notes (Signed)
VOL  PENDING  PLACEMENT 

## 2019-09-03 NOTE — ED Notes (Signed)
Pt received orange sherbet for bedtime snack  Pt calm and relaxing   lw edt

## 2019-09-03 NOTE — ED Notes (Signed)
Report to include Situation, Background, Assessment, and Recommendations received from Amy B. RN. Patient alert and oriented, warm and dry, in no acute distress. Patient denies SI, HI, AVH and pain. Patient made aware of Q15 minute rounds and security cameras for their safety. Patient instructed to come to me with needs or concerns. 

## 2019-09-03 NOTE — ED Provider Notes (Signed)
-----------------------------------------   8:12 AM on 09/03/2019 -----------------------------------------   Blood pressure 129/68, pulse 60, temperature 97.9 F (36.6 C), temperature source Oral, resp. rate 20, height 5\' 4"  (1.626 m), weight 65.3 kg, SpO2 98 %.  The patient is calm and cooperative at this time.  There have been no acute events since the last update.  Awaiting disposition plan from Behavioral Medicine and/or Social Work team(s).   Carrie Mew, MD 09/03/19 [phone]

## 2019-09-04 DIAGNOSIS — F29 Unspecified psychosis not due to a substance or known physiological condition: Secondary | ICD-10-CM | POA: Diagnosis not present

## 2019-09-04 NOTE — ED Notes (Signed)
Hourly rounding reveals patient awake in room. No complaints, stable, in no acute distress. Q15 minute rounds and monitoring via Security Cameras to continue. 

## 2019-09-04 NOTE — ED Notes (Signed)
Hourly rounding reveals patient sleeping in room. No complaints, stable, in no acute distress. Q15 minute rounds and monitoring via Security Cameras to continue. 

## 2019-09-04 NOTE — ED Provider Notes (Signed)
The patient has been placed in psychiatric observation due to the need to provide a safe environment for the patient while obtaining psychiatric consultation and evaluation, as well as ongoing medical and medication management to treat the patient's condition.  The patient has not been placed under full IVC at this time.    Alfred Levins, [place], MD 09/04/19 [phone]

## 2019-09-04 NOTE — ED Notes (Signed)
Pt. Appears to be in a good mood.  Pt. Stated it is his birthday today, pt. Has had family visit today.  Pt. Has no concerns or questions at this time.

## 2019-09-04 NOTE — ED Notes (Signed)
Pt given orange juice per request.

## 2019-09-04 NOTE — ED Notes (Signed)
Pt given meal tray and a cup of sprite.

## 2019-09-05 DIAGNOSIS — F29 Unspecified psychosis not due to a substance or known physiological condition: Secondary | ICD-10-CM | POA: Diagnosis not present

## 2019-09-05 NOTE — ED Notes (Signed)
Pt awoken by nurse and found to be soaked with urine. This EDT cleaned bed and changed sheet while pt was allowed to take shower.

## 2019-09-05 NOTE — ED Notes (Signed)
Breakfast tray provided. 

## 2019-09-05 NOTE — ED Notes (Signed)
Pt given meal tray.

## 2019-09-05 NOTE — ED Provider Notes (Signed)
-----------------------------------------   7:23 AM on 09/05/2019 -----------------------------------------   Blood pressure (!) 173/91, pulse 76, temperature 98 F (36.7 C), temperature source Oral, resp. rate 18, height 5\' 4"  (1.626 m), weight 65.3 kg, SpO2 100 %.  The patient is calm and cooperative at this time.  There have been no acute events since the last update.  Awaiting disposition plan from Behavioral Medicine and/or Social Work team(s).   Paulette Blanch, MD 09/05/19 [phone]

## 2019-09-05 NOTE — ED Notes (Signed)
Pt. Up using bathroom, pt returned to room with steady gait.

## 2019-09-05 NOTE — ED Notes (Signed)
Patient out of bed o bring his trash to nursing station. Ambulates with shuffle but steady. Back to bed.

## 2019-09-05 NOTE — ED Notes (Signed)
In bed awake watching TV. House keeping called to mop his room.

## 2019-09-05 NOTE — ED Notes (Signed)
Patient ate 100% of his lunch.

## 2019-09-05 NOTE — TOC Progression Note (Signed)
Transition of Care [facility]) - Progression Note    Patient Details  Name: [name] MRN: [id number] Date of Birth: 06-15-1961  Transition of Care [facility]) CM/SW [place], RN Phone Number: 09/05/2019, 2:55 PM  Clinical Narrative:    Incoming call from [place] with [facility] states facility can accept patient under Medicaid if he is denied from [facility] for skilled care. Patient is in need of memory care/Medicaid placement. RN CM will begin initiating SNF request via [employer].    Expected Discharge Plan: Memory Care    Expected Discharge Plan and Services Expected Discharge Plan: Memory Care       Living arrangements for the past 2 months: Single Family Home                                       Social Determinants of Health (SDOH) Interventions    Readmission Risk Interventions No flowsheet data found.

## 2019-09-05 NOTE — ED Notes (Signed)
Patient out of bed to bathroom with a shuffle and steady gait.

## 2019-09-05 NOTE — TOC Progression Note (Addendum)
Transition of Care [facility]) - Progression Note    Patient Details  Name: Peter Cox MRN: [id number] Date of Birth: [birth year]  Transition of Care [facility]) CM/SW [place], RN Phone Number: 09/05/2019, 10:59 AM  Clinical Narrative:    Received call from [facility] [phone] requesting if patient continued to be looking for bed. RN CM advised patient remains looking for bed. Vickii Chafe states they are waiting for final clearance from state to begin admitting and he will be first on the list. Expected this week. Call to [phone] following up on previous bed request. Levander Campion does not recall previous conversations with RN CM  and states that she is not able to take patient to her facilities at this time due to unable to accommodate managed care patients. Levander Campion states she will discuss with her leadership possibility of pursuing LTC placement via Medicaid if denied skilled services through managed care provider.  RN CM will continue to pursue alternate options.    Expected Discharge Plan: Memory Care    Expected Discharge Plan and Services Expected Discharge Plan: Memory Care       Living arrangements for the past 2 months: Single Family Home                                       Social Determinants of Health (SDOH) Interventions    Readmission Risk Interventions No flowsheet data found.

## 2019-09-05 NOTE — Progress Notes (Signed)
Started auth with [employer]-faxed requested information to [employer] as requested.

## 2019-09-05 NOTE — ED Notes (Signed)
Patient up early incontinent of bladder, requesting shower early this morning. Shower supplies provided. Tech at door to assure safety, patient changed into clean dry clothes, bed linen changed.awaiting breakfast abd scheduled meds.

## 2019-09-05 NOTE — ED Notes (Signed)
VOL  PENDING  PLACEMENT 

## 2019-09-05 NOTE — ED Notes (Signed)
Pt up to use bathroom at this time.

## 2019-09-06 DIAGNOSIS — F29 Unspecified psychosis not due to a substance or known physiological condition: Secondary | ICD-10-CM | POA: Diagnosis not present

## 2019-09-06 NOTE — ED Notes (Signed)
Hourly rounding reveals patient sleeping in room. No complaints, stable, in no acute distress. Q15 minute rounds and monitoring via Security Cameras to continue. 

## 2019-09-06 NOTE — ED Notes (Signed)
Patient ambulated with steady gait to bathroom. No distress noted. Patient provided beverage per request.

## 2019-09-06 NOTE — ED Notes (Signed)
Pt. Requested and was given soda and ice cream upon request.

## 2019-09-06 NOTE — TOC Progression Note (Signed)
Transition of Care [facility]) - Progression Note    Patient Details  Name: Peter Cox MRN: [id number] Date of Birth: 06/18/1961  Transition of Care [facility]) CM/SW [place], RN Phone Number: 09/06/2019, 11:03 AM  Clinical Narrative:    Spoke with Diane @ [facility]- RN CM updated that [name] was working on review for SNF request and would update RN CM once decision made. Possible transfer to [facility] early next week if denied and able to transfer under Medicaid however depending on insurance authorization. Will check in with Diane on Monday or once insurance decision completed.    Expected Discharge Plan: Memory Care    Expected Discharge Plan and Services Expected Discharge Plan: Memory Care       Living arrangements for the past 2 months: Single Family Home                                       Social Determinants of Health (SDOH) Interventions    Readmission Risk Interventions No flowsheet data found.

## 2019-09-06 NOTE — ED Notes (Signed)
Patient placed food and spoon in the trash can in the nurses station

## 2019-09-06 NOTE — ED Notes (Signed)
Assisted patient with breakfast tray, patient able to feed himself

## 2019-09-06 NOTE — ED Notes (Signed)
Pt. Laying in bed eating dinner.  Pt. Calm and cooperative.  Pt. Appears to be in a good mood and continued a conversation with this nurse about tv program playing on tv.  Pt. Has no questions or concerns at this time.

## 2019-09-06 NOTE — ED Notes (Signed)
Asked patient if he wanted to take a shower, patient said no he took one yesterday

## 2019-09-06 NOTE — ED Provider Notes (Signed)
Emergency Medicine Observation Re-evaluation Note  Peter Cox is a 59 y.o. male, seen on rounds today.  Pt initially presented to the ED for complaints of Psychiatric Evaluation Currently, the patient is stable.  Physical Exam  BP (!) 163/76   Pulse 62   Temp 98.3 F (36.8 C)   Resp 16   Ht 5\' 4"  (1.626 m)   Wt 65.3 kg   SpO2 100%   BMI 24.72 kg/m  Physical Exam  ED Course / MDM  I have reviewed the labs performed to date as well as medications administered while in observation.  Recent changes in the last 24 hours include none. Plan  Current plan is for psychiatric disposition. Patient is under full IVC at this time.   Alfred Levins, [place], MD 09/06/19 [phone]

## 2019-09-06 NOTE — ED Notes (Signed)
Patient given a spoon for breakfast

## 2019-09-06 NOTE — TOC Progression Note (Signed)
Transition of Care [facility]) - Progression Note    Patient Details  Name: Peter Cox MRN: [id number] Date of Birth: 15-Nov-1960  Transition of Care [facility]) CM/SW [place], RN Phone Number: 09/06/2019, 9:00 AM  Clinical Narrative:    Damaris Schooner to [facility] states patient was denied for SNF however case is going to [employer] for review. [employer] will update once decision made.    Expected Discharge Plan: Memory Care    Expected Discharge Plan and Services Expected Discharge Plan: Memory Care       Living arrangements for the past 2 months: Single Family Home                                       Social Determinants of Health (SDOH) Interventions    Readmission Risk Interventions No flowsheet data found.

## 2019-09-06 NOTE — ED Notes (Signed)
Patient returned the spoon from lunch and placed his lunch tray in the trash can

## 2019-09-06 NOTE — ED Notes (Signed)
Patient returned spoon from lunch and placed it in the garbage can in the nurses station

## 2019-09-06 NOTE — ED Notes (Signed)
Gave patient a spoon for lunch

## 2019-09-07 DIAGNOSIS — F29 Unspecified psychosis not due to a substance or known physiological condition: Secondary | ICD-10-CM | POA: Diagnosis not present

## 2019-09-07 LAB — URINALYSIS, COMPLETE (UACMP) WITH MICROSCOPIC
Bacteria, UA: NONE SEEN
Bilirubin Urine: NEGATIVE
Glucose, UA: NEGATIVE mg/dL
Hgb urine dipstick: NEGATIVE
Ketones, ur: NEGATIVE mg/dL
Nitrite: NEGATIVE
Protein, ur: 30 mg/dL — AB
Specific Gravity, Urine: 1.017 (ref 1.005–1.030)
pH: 5 (ref 5.0–8.0)

## 2019-09-07 NOTE — ED Notes (Signed)
Dinner meal tray provided.

## 2019-09-07 NOTE — ED Provider Notes (Signed)
-----------------------------------------   6:10 AM on 09/07/2019 -----------------------------------------   Blood pressure (!) 160/75, pulse 62, temperature 98.1 F (36.7 C), temperature source Oral, resp. rate 16, height 5\' 4"  (1.626 m), weight 65.3 kg, SpO2 97 %.  The patient is calm and cooperative at this time.  There have been no acute events since the last update.  Awaiting disposition plan from Behavioral Medicine and/or Social Work team(s).   Paulette Blanch, MD 09/07/19 [phone]

## 2019-09-07 NOTE — ED Notes (Signed)
Pt given snack of sandwich tray and sprite

## 2019-09-07 NOTE — ED Notes (Signed)
Meal tray provided.

## 2019-09-07 NOTE — ED Notes (Signed)
Pt given supplies and clean scrubs to take a shower. Bed cleaned and linen changed by EDT.

## 2019-09-07 NOTE — ED Notes (Signed)
Pt. Up using bathroom, pt returned to room with steady gait.

## 2019-09-07 NOTE — ED Notes (Signed)
VOL/Pending Placement 

## 2019-09-08 DIAGNOSIS — F29 Unspecified psychosis not due to a substance or known physiological condition: Secondary | ICD-10-CM | POA: Diagnosis not present

## 2019-09-08 NOTE — ED Notes (Signed)
Patient given a spoon for lunch

## 2019-09-08 NOTE — ED Notes (Signed)
Hourly rounding reveals patient sleeping in room. No complaints, stable, in no acute distress. Q15 minute rounds and monitoring via Security Cameras to continue. 

## 2019-09-08 NOTE — ED Notes (Signed)
Report to include Situation, Background, Assessment, and Recommendations received from Jadeka RN. Patient alert and oriented, warm and dry, in no acute distress. Patient denies SI, HI, AVH and pain. Patient made aware of Q15 minute rounds and security cameras for their safety. Patient instructed to come to me with needs or concerns. 

## 2019-09-08 NOTE — ED Notes (Signed)
Spoon given back to staff by patient

## 2019-09-08 NOTE — ED Notes (Signed)
Patient gave spoon back with dinner and placed in trash

## 2019-09-08 NOTE — ED Notes (Signed)
Patient voided in bed, nurse asked patient if he wanted to take a shower, RN placed blue chair and his toiletries in the shower with him along the side and showed patient how to turn the shower on. When asked if he needed assistance, patient said no I got it.

## 2019-09-08 NOTE — ED Notes (Signed)
Hourly rounding reveals patient in rest room. No complaints, stable, in no acute distress. Q15 minute rounds and monitoring via [employer] to continue. Patient assisted after wetting his cloths and bed.

## 2019-09-08 NOTE — ED Notes (Signed)
Hourly rounding reveals patient awake in room. No complaints, stable, in no acute distress. Q15 minute rounds and monitoring via Security Cameras to continue. 

## 2019-09-08 NOTE — ED Notes (Signed)
Patient given spoon for dinner

## 2019-09-08 NOTE — ED Notes (Signed)
Spoon given back by patient

## 2019-09-08 NOTE — ED Notes (Signed)
Patient given a spoon for breakfast

## 2019-09-08 NOTE — ED Provider Notes (Signed)
-----------------------------------------   7:55 AM on 09/08/2019 -----------------------------------------   Blood pressure (!) 156/88, pulse 67, temperature 97.8 F (36.6 C), temperature source Oral, resp. rate 18, height 5\' 4"  (1.626 m), weight 65.3 kg, SpO2 99 %.  The patient is calm and cooperative at this time.  There have been no acute events since the last update.  Awaiting disposition plan from Behavioral Medicine and/or Social Work team(s).    Delman Kitten, MD 09/08/19 [phone]

## 2019-09-09 DIAGNOSIS — F29 Unspecified psychosis not due to a substance or known physiological condition: Secondary | ICD-10-CM | POA: Diagnosis not present

## 2019-09-09 NOTE — ED Notes (Signed)
Hourly rounding reveals patient awake in room. No complaints, stable, in no acute distress. Q15 minute rounds and monitoring via Security Cameras to continue. 

## 2019-09-09 NOTE — ED Notes (Signed)
Pt. Ate good supper.

## 2019-09-09 NOTE — ED Provider Notes (Signed)
-----------------------------------------   6:43 AM on 09/09/2019 -----------------------------------------   Blood pressure 132/73, pulse 68, temperature 97.7 F (36.5 C), temperature source Oral, resp. rate 16, height 1.626 m (5\' 4" ), weight 65.3 kg, SpO2 98 %.  The patient is calm and cooperative at this time.  There have been no acute events since the last update.  Awaiting disposition plan from Behavioral Medicine and/or Social Work team(s).   Hinda Kehr, MD 09/09/19 [phone]

## 2019-09-09 NOTE — ED Notes (Signed)
Report to include Situation, Background, Assessment, and Recommendations received from [facility]. Patient alert, warm and dry, in no acute distress. Patient denies SI, HI, AVH and pain. Patient made aware of Q15 minute rounds and security cameras for their safety. Patient instructed to come to me with needs or concerns.

## 2019-09-09 NOTE — ED Notes (Signed)
Patient got up to go to the bathroom, no signs of distress, will continue to monitor, q 15 min checks and camera surveillance for safety.

## 2019-09-09 NOTE — ED Notes (Signed)
Hourly rounding reveals patient sleeping in room. No complaints, stable, in no acute distress. Q15 minute rounds and monitoring via Security Cameras to continue. 

## 2019-09-09 NOTE — TOC Progression Note (Signed)
Transition of Care [facility]) - Progression Note    Patient Details  Name: Peter Cox MRN: [id number] Date of Birth: [birth year]  Transition of Care [facility]) CM/SW [place], RN Phone Number: 09/09/2019, 11:08 AM  Clinical Narrative:    Received VM from [facility] states patient was denied SNF. RN CM attempted to outreach to Manuela Schwartz, Firefighter with [employer] and request denial letter be faxed to [facility]. Had to leave message for Manuela Schwartz to call back as she was unavailable.    Expected Discharge Plan: Memory Care    Expected Discharge Plan and Services Expected Discharge Plan: Memory Care       Living arrangements for the past 2 months: Single Family Home                                       Social Determinants of Health (SDOH) Interventions    Readmission Risk Interventions No flowsheet data found.

## 2019-09-09 NOTE — ED Notes (Signed)
Patient is cooperative, has came to the door twice and ask for shoes, and states that He wants to leave this place, Patient had snack, awaiting His meal tray, will continue to monitor.

## 2019-09-09 NOTE — ED Notes (Signed)
Patient in shower 

## 2019-09-09 NOTE — ED Notes (Signed)
VOL  PENDING  PLACEMENT 

## 2019-09-10 DIAGNOSIS — F29 Unspecified psychosis not due to a substance or known physiological condition: Secondary | ICD-10-CM | POA: Diagnosis not present

## 2019-09-10 LAB — RESPIRATORY PANEL BY RT PCR (FLU A&B, COVID)
Influenza A by PCR: NEGATIVE
Influenza B by PCR: NEGATIVE
SARS Coronavirus 2 by RT PCR: NEGATIVE

## 2019-09-10 NOTE — ED Notes (Signed)
Hourly rounding reveals patient sleeping in room. No complaints, stable, in no acute distress. Q15 minute rounds and monitoring via Security Cameras to continue. 

## 2019-09-10 NOTE — TOC Progression Note (Signed)
Transition of Care [facility]) - Progression Note    Patient Details  Name: Peter Cox MRN: [id number] Date of Birth: 10-18-1960  Transition of Care [facility]) CM/SW [place], RN Phone Number: 09/10/2019, 10:36 AM  Clinical Narrative:    Damaris Schooner to Diane with [facility] to update of insurance denial for SNF. Requested rapid COVID test be completed ASAP.    Expected Discharge Plan: Memory Care    Expected Discharge Plan and Services Expected Discharge Plan: Memory Care       Living arrangements for the past 2 months: Single Family Home                                       Social Determinants of Health (SDOH) Interventions    Readmission Risk Interventions No flowsheet data found.

## 2019-09-10 NOTE — ED Notes (Signed)
Hourly rounding reveals patient awake in room. No complaints, stable, in no acute distress. Q15 minute rounds and monitoring via Security Cameras to continue. 

## 2019-09-10 NOTE — TOC Progression Note (Addendum)
Transition of Care [facility]) - Progression Note    Patient Details  Name: Peter Cox MRN: [id number] Date of Birth: [birth year]  Transition of Care [facility]) CM/SW Contact  Anselm Pancoast, RN Phone Number: 09/10/2019, 12:45 PM  Clinical Narrative:    Confirmed patient is COVID negative. Will outreach to Diane @ [facility] to begin transfer. Diane confirmed patient is ready for transfer today if able to complete before evening. Patient will admit to room 223-B at [facility]. Report to be called to [phone]. RN CM contacted EMS who are unable to transfer today. RN CM Confirmed with Beverely Low @ [facility] patient can be transported today @ 2:30pm.    Expected Discharge Plan: Memory Care    Expected Discharge Plan and Services Expected Discharge Plan: [place] arrangements for the past 2 months: Single Family Home                                       Social Determinants of Health (SDOH) Interventions    Readmission Risk Interventions No flowsheet data found.

## 2019-09-10 NOTE — TOC Transition Note (Signed)
Transition of Care [facility]) - CM/SW Discharge Note   Patient Details  Name: Peter Cox MRN: [id number] Date of Birth: 01-11-1961  Transition of Care [facility]) CM/SW Contact:  Anselm Pancoast, RN Phone Number: 09/10/2019, 1:17 PM   Clinical Narrative:    Patient scheduled for pickup at 2:30pm by [facility]. Discharging to [place]. RN CM attempted to call daughter and notify of transfer today but no answer and no voicemail available. Daughter has spoken with RN CM and staff from [facility] and is aware of transfer to [facility].          Patient Goals and CMS Choice Patient states their goals for this hospitalization and ongoing recovery are:: no answer from patient-daughter states she wants him safe      Discharge Placement                       Discharge Plan and Services                                     Social Determinants of Health (SDOH) Interventions     Readmission Risk Interventions No flowsheet data found.

## 2019-09-10 NOTE — TOC Progression Note (Signed)
Transition of Care [facility]) - Progression Note    Patient Details  Name: MAZIN EMMA MRN: [id number] Date of Birth: 10-29-1960  Transition of Care [facility]) CM/SW [place], RN Phone Number: 09/10/2019, 10:11 AM  Clinical Narrative:    Damaris Schooner with Preeti [facility]ner, [place] with ref # [id number]. Advised SNF request had been denied by [facility]. Denial letter would be mailed to ED Dr. Lavonia Drafts.    Expected Discharge Plan: Memory Care    Expected Discharge Plan and Services Expected Discharge Plan: Memory Care       Living arrangements for the past 2 months: Single Family Home                                       Social Determinants of Health (SDOH) Interventions    Readmission Risk Interventions No flowsheet data found.

## 2019-09-10 NOTE — ED Provider Notes (Signed)
-----------------------------------------   7:12 AM on 09/10/2019 -----------------------------------------   BP (!) 149/81 (BP Location: Right Arm)   Pulse 61   Temp 97.8 F (36.6 C) (Oral)   Resp 16   Ht 1.626 m (5\' 4" )   Wt 65.3 kg   SpO2 100%   BMI 24.72 kg/m   No acute events overnight. Vitals reviewed. Patient remains medically cleared.  Disposition is pending per Psychiatry/Behavioral Medicine team recommendations.    Lavonia Drafts, MD 09/10/19 [phone]

## 2019-09-10 NOTE — ED Notes (Signed)
ALL  PAPERWORK  SENT  WITH PT  TO  [facility]

## 2019-09-10 NOTE — ED Provider Notes (Signed)
Patient is to be discharged today, social work has found a place that will accept him   Lavonia Drafts, MD 09/10/19 [phone]

## 2019-09-24 ENCOUNTER — Other Ambulatory Visit: Payer: Self-pay | Admitting: Pharmacist

## 2019-09-24 ENCOUNTER — Telehealth: Payer: Self-pay

## 2019-09-24 DIAGNOSIS — B20 Human immunodeficiency virus [HIV] disease: Secondary | ICD-10-CM

## 2019-09-24 MED FILL — DONEPEZIL HCL 10 MG TABLET: 10 | 30 days supply | Qty: 30 | Fill #2

## 2019-09-24 NOTE — Telephone Encounter (Signed)
Received refill request for patient's Biktarvy. Last refill was sent in 04/2019.Spoke with [facility] to follow up medication refill history. Last filled on 05/2473/20. Last update was from patient's daughter who reports patient is in a nursing home. Medication is being filled by their pharmacy. [place]

## 2019-09-26 ENCOUNTER — Other Ambulatory Visit: Payer: Medicare HMO

## 2019-09-26 ENCOUNTER — Other Ambulatory Visit: Payer: Self-pay

## 2019-09-26 DIAGNOSIS — B20 Human immunodeficiency virus [HIV] disease: Secondary | ICD-10-CM

## 2019-09-27 ENCOUNTER — Other Ambulatory Visit: Payer: Self-pay | Admitting: Family Medicine

## 2019-09-27 ENCOUNTER — Other Ambulatory Visit (HOSPITAL_COMMUNITY): Payer: Self-pay | Admitting: Physician Assistant

## 2019-09-27 LAB — T-HELPER CELL (CD4) - (RCID CLINIC ONLY)
CD4 % Helper T Cell: 33 % (ref 33–65)
CD4 T Cell Abs: 409 /uL (ref 400–1790)

## 2019-09-27 MED ORDER — ATORVASTATIN CALCIUM 20 MG PO TABS: 20.0000 mg | ORAL_TABLET | Freq: Every day | ORAL | 0 refills | Status: DC

## 2019-09-27 MED ORDER — OLANZAPINE 5 MG PO TABS: 5.0000 mg | ORAL_TABLET | Freq: Every day | ORAL | 3 refills | Status: DC | PRN

## 2019-09-27 MED ORDER — DIVALPROEX SODIUM 125 MG PO CSDR: 250.0000 mg | DELAYED_RELEASE_CAPSULE | Freq: Two times a day (BID) | ORAL | 2 refills | Status: DC

## 2019-09-27 MED ORDER — AMLODIPINE BESYLATE 10 MG PO TABS: 10.0000 mg | ORAL_TABLET | Freq: Every day | ORAL | 11 refills | Status: DC

## 2019-09-27 MED FILL — OLANZapine 5 MG TABS: 5 | 30 days supply | Qty: 30 | Fill #0

## 2019-09-27 MED FILL — DIVALPROEX SODIUM 125 MG CA: 125 | 15 days supply | Qty: 60 | Fill #0

## 2019-09-27 MED FILL — AMLODIPINE BESYLATE 10 MG T: 10 | 30 days supply | Qty: 30 | Fill #0

## 2019-09-27 MED FILL — BIKTARVY 50-200-25 MG TABS: 50-200-25 | 30 days supply | Qty: 30 | Fill #0

## 2019-09-27 MED FILL — ATORVASTATIN 20 MG TABLET: 20 | 90 days supply | Qty: 90 | Fill #0

## 2019-09-27 NOTE — Telephone Encounter (Signed)
Peter Cox with [facility] is calling and dr b filled out fl2 and pt needs refills on cyanocobalamine 100 mcg, amlodipine 10 mg, atorvastatin 20 mg, olanzapine 5 mg  and depakote sprinkle 25 mg 90 day supply sent to [place] outpt pharm. Pt has an appt on 10-15-2019

## 2019-09-28 LAB — COMPLETE METABOLIC PANEL WITH GFR
AG Ratio: 1.4 (calc) (ref 1.0–2.5)
ALT: 14 U/L (ref 9–46)
AST: 17 U/L (ref 10–35)
Albumin: 4.2 g/dL (ref 3.6–5.1)
Alkaline phosphatase (APISO): 88 U/L (ref 35–144)
BUN/Creatinine Ratio: 12 (calc) (ref 6–22)
BUN: 32 mg/dL — ABNORMAL HIGH (ref 7–25)
CO2: 32 mmol/L (ref 20–32)
Calcium: 9.6 mg/dL (ref 8.6–10.3)
Chloride: 108 mmol/L (ref 98–110)
Creat: 2.58 mg/dL — ABNORMAL HIGH (ref 0.70–1.25)
GFR, Est African American: 30 mL/min/{1.73_m2} — ABNORMAL LOW (ref >=60)
GFR, Est Non African American: 26 mL/min/{1.73_m2} — ABNORMAL LOW (ref >=60)
Globulin: 3.1 g/dL (calc) (ref 1.9–3.7)
Glucose, Bld: 80 mg/dL (ref 65–99)
Potassium: 4.5 mmol/L (ref 3.5–5.3)
Sodium: 144 mmol/L (ref 135–146)
Total Bilirubin: 0.3 mg/dL (ref 0.2–1.2)
Total Protein: 7.3 g/dL (ref 6.1–8.1)

## 2019-09-28 LAB — CBC WITH DIFFERENTIAL/PLATELET
Absolute Monocytes: 384 cells/uL (ref 200–950)
Basophils Absolute: 43 cells/uL (ref 0–200)
Basophils Relative: 0.7 %
Eosinophils Absolute: 140 cells/uL (ref 15–500)
Eosinophils Relative: 2.3 %
HCT: 39.1 % (ref 38.5–50.0)
Hemoglobin: 13.1 g/dL — ABNORMAL LOW (ref 13.2–17.1)
Lymphs Abs: 1129 cells/uL (ref 850–3900)
MCH: 32.1 pg (ref 27.0–33.0)
MCHC: 33.5 g/dL (ref 32.0–36.0)
MCV: 95.8 fL (ref 80.0–100.0)
MPV: 10.4 fL (ref 7.5–12.5)
Monocytes Relative: 6.3 %
Neutro Abs: 4404 cells/uL (ref 1500–7800)
Neutrophils Relative %: 72.2 %
Platelets: 158 10*3/uL (ref 140–400)
RBC: 4.08 10*6/uL — ABNORMAL LOW (ref 4.20–5.80)
RDW: 14.1 % (ref 11.0–15.0)
Total Lymphocyte: 18.5 %
WBC: 6.1 10*3/uL (ref 3.8–10.8)

## 2019-09-28 LAB — HIV-1 RNA QUANT-NO REFLEX-BLD
HIV 1 RNA Quant: <20 copies/mL — AB
HIV-1 RNA Quant, Log: <1.3 Log copies/mL — AB

## 2019-10-01 ENCOUNTER — Other Ambulatory Visit: Payer: Self-pay | Admitting: Family Medicine

## 2019-10-01 ENCOUNTER — Telehealth: Payer: Self-pay

## 2019-10-01 MED ORDER — LABETALOL HCL 100 MG PO TABS: 100.0000 mg | ORAL_TABLET | Freq: Two times a day (BID) | ORAL | 5 refills | Status: DC

## 2019-10-01 NOTE — Telephone Encounter (Signed)
Have you seen this form yet?  Thanks,   -Mickel Baas

## 2019-10-01 NOTE — Telephone Encounter (Signed)
Requested medication (s) are due for refill today: yes  Requested medication (s) are on the active medication list: yes  Last refill:  last refilled by another provider  Future visit scheduled: yes  Notes to clinic:  Elita with [facility] called to request refill for pt's labetalol (NORMODYNE) 100 MG tablet/B12      Requested Prescriptions  Pending Prescriptions Disp Refills   labetalol (NORMODYNE) 100 MG tablet 60 tablet 5    Sig: Take 1 tablet (100 mg total) by mouth 2 (two) times daily.      Cardiovascular:  Beta Blockers Failed - 10/01/2019 10:18 AM      Failed - Last BP in normal range    BP Readings from Last 1 Encounters:  09/10/19 (!) 155/88          Passed - Last Heart Rate in normal range    Pulse Readings from Last 1 Encounters:  09/10/19 66          Passed - Valid encounter within last 6 months    Recent Outpatient Visits           4 months ago Dementia associated with other underlying disease with behavioral disturbance [facility])   [facility] [place], Dionne Bucy, MD   1 year ago Essential hypertension   [place], Dionne Bucy, MD       Future Appointments             In 2 weeks [place], Dionne Bucy, MD [facility], Fernando [place]

## 2019-10-01 NOTE — Telephone Encounter (Signed)
Elita with [facility] called to request refill for pt's labetalol ([place]) 100 MG tablet/B12  Stated she did not know if it should go to  [id number] Lorina Rabon, [place] Phone:  [phone]  Fax:  [phone]

## 2019-10-01 NOTE — Telephone Encounter (Signed)
LOV 07/10/18

## 2019-10-01 NOTE — Telephone Encounter (Signed)
I have not seen this

## 2019-10-01 NOTE — Telephone Encounter (Signed)
Copied from [place] [id number]. Topic: General - Other >> Oct 01, 2019 11:51 AM Oneta Rack wrote: Caller name: Aldea  Relation to pt: from [facility]  Call back number: [phone] fax [phone]   Reason for call:  Checking on the status if form was received, form faxed today to [phone]. Please note when received

## 2019-10-01 NOTE — Telephone Encounter (Signed)
Copied from [place] [id number]. Topic: General - Other >> Oct 01, 2019 11:51 AM Oneta Rack wrote: Caller name: Aldea  Relation to pt: from [facility]  Call back number: [phone] fax [phone]   Reason for call:  Checking on the status if form was received, form faxed today to [phone]. Please note when received

## 2019-10-04 NOTE — Telephone Encounter (Signed)
Form was faxed on 10/02/2019. Aldea reports she has received form.

## 2019-10-10 ENCOUNTER — Other Ambulatory Visit: Payer: Self-pay

## 2019-10-10 ENCOUNTER — Ambulatory Visit (INDEPENDENT_AMBULATORY_CARE_PROVIDER_SITE_OTHER): Payer: Medicare HMO | Admitting: Family

## 2019-10-10 ENCOUNTER — Encounter: Payer: Self-pay | Admitting: Family

## 2019-10-10 ENCOUNTER — Other Ambulatory Visit (HOSPITAL_COMMUNITY): Payer: Self-pay | Admitting: Family

## 2019-10-10 VITALS — BP 158/91 | HR 79 | Temp 97.6°F | Ht 64.0 in | Wt 136.6 lb

## 2019-10-10 DIAGNOSIS — F0281 Dementia in other diseases classified elsewhere with behavioral disturbance: Secondary | ICD-10-CM

## 2019-10-10 DIAGNOSIS — F02818 Dementia in other diseases classified elsewhere, unspecified severity, with other behavioral disturbance: Secondary | ICD-10-CM

## 2019-10-10 DIAGNOSIS — Z113 Encounter for screening for infections with a predominantly sexual mode of transmission: Secondary | ICD-10-CM

## 2019-10-10 DIAGNOSIS — B20 Human immunodeficiency virus [HIV] disease: Secondary | ICD-10-CM

## 2019-10-10 DIAGNOSIS — I10 Essential (primary) hypertension: Secondary | ICD-10-CM

## 2019-10-10 DIAGNOSIS — N1832 Chronic kidney disease, stage 3b: Secondary | ICD-10-CM | POA: Diagnosis not present

## 2019-10-10 MED ORDER — BIKTARVY 50-200-25 MG PO TABS: 1.0000 | ORAL_TABLET | Freq: Every day | ORAL | 11 refills | Status: DC

## 2019-10-10 NOTE — Patient Instructions (Signed)
Nice to see you.  We will continue your current dose of Biktarvy.  Refills will be sent to the pharmacy.  Covid Vaccination available at [employer].   [email or web address]  We will plan on follow up in 6 months or sooner if needed with lab work 1-2 weeks prior to your appointment.   Have a great day and stay safe!

## 2019-10-10 NOTE — Assessment & Plan Note (Signed)
Blood pressure elevated today above goal 140/90 with current medication regimen.  No red flag/warning symptoms concerning for intracranial pathology although he does have worsening renal function at times which is likely multifactorial between HIV and hypertension.  Continue current dose of amlodipine and labetalol per primary care recommendations.

## 2019-10-10 NOTE — Assessment & Plan Note (Signed)
Mr. Fagin has well-controlled HIV disease with good adherence and tolerance to his ART regimen of Biktarvy.  No signs/symptoms of opportunistic infection or new progressive HIV disease.  Reviewed lab work and discussed plan of care.  Continue to monitor renal function with worsening creatinine and creatinine clearance.  Continue current dose of Biktarvy for now.  Plan for follow-up in 6 months or sooner if needed with lab work 1 to 2 weeks prior to appointment.

## 2019-10-10 NOTE — Progress Notes (Signed)
Subjective:    Patient ID: Peter Cox, male    DOB: 1960-10-28, 59 y.o.   MRN: [id number]  Chief Complaint  Patient presents with  . Follow-up     HPI:  Peter Cox is a 59 y.o. male with HIV disease and dementia last seen in the office on 09/13/2018 with good adherence and tolerance to his ART regimen of Biktarvy.  CD4 count was 310 with a viral load that was undetectable.  There was concern he was not taking medication at the time.  He was referred to neurology for further assessment and treatment of dementia.  Most recent blood work completed on 09/26/2019 with a viral load that remains undetectable and CD4 count of 409.  Renal function with creatinine of 2.58.  His daughter is present today for his office visit.  [name] continues to take his [place] Odor as prescribed with no adverse side effects or missed doses since his last office visit.  Overall feeling well today with no new concerns/complaints. Denies fevers, chills, night sweats, headaches, changes in vision, neck pain/stiffness, nausea, diarrhea, vomiting, lesions or rashes.  [name] has no problems obtaining his medication from the pharmacy and remains covered through [facility].  Denies feelings of being down, depressed, or hopeless recently.  No recreational or illicit drug use, tobacco use, or alcohol consumption.  He is not currently sexually active and requesting condoms today.  He is contemplating receiving the Covid vaccination series.  Most recent dental cleaning/screening was approximately 2 weeks ago.   No Known Allergies    Outpatient Medications Prior to Visit  Medication Sig Dispense Refill  . amLODipine (NORVASC) 10 MG tablet Take 1 tablet (10 mg total) by mouth daily. 30 tablet 11  . atorvastatin (LIPITOR) 20 MG tablet Take 1 tablet (20 mg total) by mouth daily. 90 tablet 0  . divalproex (DEPAKOTE SPRINKLE) 125 MG capsule Take 2 capsules (250 mg total) by mouth every 12 (twelve) hours. 60 capsule 2   . donepezil (ARICEPT) 10 MG tablet Take 1/2 tablet daily for 2 weeks, then increase to 1 tablet daily (Patient taking differently: Take 10 mg by mouth daily. ) 30 tablet 11  . labetalol (NORMODYNE) 100 MG tablet Take 1 tablet (100 mg total) by mouth 2 (two) times daily. 60 tablet 5  . OLANZapine (ZYPREXA) 5 MG tablet Take 1 tablet (5 mg total) by mouth daily as needed (agitation). 30 tablet 3  . BIKTARVY 50-200-25 MG TABS tablet TAKE 1 TABLET BY MOUTH DAILY. 30 tablet 0   No facility-administered medications prior to visit.     Past Medical History:  Diagnosis Date  . Alcohol abuse    heavy, clean since April  . Alcohol abuse 11/24/2014  . Cocaine abuse ([place])     clean since about 2000  . Drug abuse ([place]) 11/24/2014  . HIV infection ([place])    dx 08/08/2008 (unknown how he was exposed)  Initial VL 43,200 and CD4 410 on 08/28/08  . Hyperlipidemia   . Hypertension   . Marijuana abuse   . Mild cognitive impairment    likely vascular dementia/psa/?HIV componentCT Head 02/12/08 for htn crisis and concern for stroke: impression-1. Old right internal capsule and thalamic lacunar infarcts. 2. Mild to moderate chronic small vessell white matter ischemic changes in both cerebral hemisphere, greater on the right  . Neuromuscular disorder ([place])   . Seizures ([place])    "last year had seizure"  . Stroke ([place])   . Tobacco abuse  Past Surgical History:  Procedure Laterality Date  . CLOSED REDUCTION PATELLAR     right knee  . HEMORRHOID SURGERY    . SHOULDER ARTHROSCOPY  01/2008   w/extensive debridement (Dr Mardelle Matte)     Review of Systems  Constitutional: Negative for appetite change, chills, fatigue, fever and unexpected weight change.  Eyes: Negative for visual disturbance.  Respiratory: Negative for cough, chest tightness, shortness of breath and wheezing.   Cardiovascular: Negative for chest pain and leg swelling.  Gastrointestinal: Negative for abdominal pain, constipation, diarrhea,  nausea and vomiting.  Genitourinary: Negative for dysuria, flank pain, frequency, genital sores, hematuria and urgency.  Skin: Negative for rash.  Allergic/Immunologic: Negative for immunocompromised state.  Neurological: Negative for dizziness and headaches.      Objective:    BP (!) 158/91 (BP Location: Left Arm)   Pulse 79   Temp 97.6 F (36.4 C)   Ht 5\' 4"  (1.626 m)   Wt 136 lb 9.6 oz (62 kg)   SpO2 100%   BMI 23.45 kg/m  Nursing note and vital signs reviewed.  Physical Exam Constitutional:      General: He is not in acute distress.    Appearance: He is well-developed.  Eyes:     Conjunctiva/sclera: Conjunctivae normal.  Cardiovascular:     Rate and Rhythm: Normal rate and regular rhythm.     Heart sounds: Normal heart sounds. No murmur. No friction rub. No gallop.   Pulmonary:     Effort: Pulmonary effort is normal. No respiratory distress.     Breath sounds: Normal breath sounds. No wheezing or rales.  Chest:     Chest wall: No tenderness.  Abdominal:     General: Bowel sounds are normal.     Palpations: Abdomen is soft.     Tenderness: There is no abdominal tenderness.  Musculoskeletal:     Cervical back: Neck supple.  Lymphadenopathy:     Cervical: No cervical adenopathy.  Skin:    General: Skin is warm and dry.     Findings: No rash.  Neurological:     Mental Status: He is alert and oriented to person, place, and time.  Psychiatric:        Behavior: Behavior normal.        Thought Content: Thought content normal.        Judgment: Judgment normal.      Depression screen [facility] 2/9 09/17/2018 04/17/2018 03/19/2018 12/04/2014 11/24/2014  Decreased Interest 0 0 0 1 0  Down, Depressed, Hopeless 2 - 0 1 0  PHQ - 2 Score 2 0 0 2 0  Altered sleeping 0 - - 1 -  Tired, decreased energy 0 - - 0 -  Change in appetite 3 - - 1 -  Feeling bad or failure about yourself  0 - - 0 -  Trouble concentrating 1 - - 0 -  Moving slowly or fidgety/restless 3 - - - -  Suicidal  thoughts 0 - - 0 -  PHQ-9 Score 9 - - 4 -  Difficult doing work/chores Not difficult at all - - Somewhat difficult -  Some recent data might be hidden       Assessment & Plan:    Patient Active Problem List   Diagnosis Date Noted  . History of substance abuse ([place]) 09/17/2018  . Urinary incontinence 09/17/2018  . Bilateral primary osteoarthritis of knee 09/13/2018  . Decreased activities of daily living (ADL) 09/13/2018  . Alcohol abuse 11/24/2014  . Dementia associated  with other underlying disease with behavioral disturbance ([place]) 07/02/2014  . History of CVA (cerebrovascular accident) 07/02/2014  . CKD (chronic kidney disease), stage III 11/20/2008  . Human immunodeficiency virus (HIV) disease ([place]) 08/17/2008  . HLD (hyperlipidemia) 06/26/2008  . Essential hypertension 06/24/2008     Problem List Items Addressed This Visit      Cardiovascular and Mediastinum   Essential hypertension (Chronic)    Blood pressure elevated today above goal 140/90 with current medication regimen.  No red flag/warning symptoms concerning for intracranial pathology although he does have worsening renal function at times which is likely multifactorial between HIV and hypertension.  Continue current dose of amlodipine and labetalol per primary care recommendations.        Nervous and Auditory   Dementia associated with other underlying disease with behavioral disturbance ([place])    Dementia appears stable and currently managed by neurology.  Family have noted no further progressions since last office visit.  Continue management per neurology recommendations.        Genitourinary   CKD (chronic kidney disease), stage III    Renal function continues to remain labile between 2 and 3.  Continue current dose of Biktarvy for now we will consider change of medication if worsening renal function.        Other   Human immunodeficiency virus (HIV) disease ([place]) - Primary (Chronic)    Mr. Leeman has  well-controlled HIV disease with good adherence and tolerance to his ART regimen of Biktarvy.  No signs/symptoms of opportunistic infection or new progressive HIV disease.  Reviewed lab work and discussed plan of care.  Continue to monitor renal function with worsening creatinine and creatinine clearance.  Continue current dose of Biktarvy for now.  Plan for follow-up in 6 months or sooner if needed with lab work 1 to 2 weeks prior to appointment.      Relevant Medications   bictegravir-emtricitabine-tenofovir AF (BIKTARVY) 50-200-25 MG TABS tablet   Other Relevant Orders   HIV-1 RNA quant-no reflex-bld   T-helper cell (CD4)- ([facility] clinic only)   Comprehensive metabolic panel    Other Visit Diagnoses    Screening for STDs (sexually transmitted diseases)       Relevant Orders   RPR       I have changed Rolland Porter. [place]'s Biktarvy. I am also having him maintain his donepezil, amLODipine, atorvastatin, divalproex, OLANZapine, and labetalol.   Meds ordered this encounter  Medications  . bictegravir-emtricitabine-tenofovir AF (BIKTARVY) 50-200-25 MG TABS tablet    Sig: Take 1 tablet by mouth daily.    Dispense:  30 tablet    Refill:  11    Order Specific Question:   Supervising Provider    Answer:   Carlyle Basques [[id number]]     Follow-up: Return in about 6 months (around 04/10/2020), or if symptoms worsen or fail to improve.   Terri Piedra, MSN, FNP-C Nurse Practitioner [facility] [place] number: [phone]

## 2019-10-10 NOTE — Assessment & Plan Note (Signed)
Renal function continues to remain labile between 2 and 3.  Continue current dose of Biktarvy for now we will consider change of medication if worsening renal function.

## 2019-10-10 NOTE — Assessment & Plan Note (Signed)
Dementia appears stable and currently managed by neurology.  Family have noted no further progressions since last office visit.  Continue management per neurology recommendations.

## 2019-10-11 NOTE — Progress Notes (Signed)
Established patient visit   Patient: Peter Cox   DOB: June 11, 1961   59 y.o. Male  MRN: [id number] Visit Date: 10/16/2019  Today's healthcare provider: Lavon Paganini, MD   Chief Complaint  Patient presents with  . Hypertension  . HIV Positive/AIDS  . Dementia   Subjective    HPI Hypertension, follow-up  BP Readings from Last 3 Encounters:  10/15/19 (!) 147/90  10/10/19 (!) 158/91  09/10/19 (!) 155/88   Wt Readings from Last 3 Encounters:  10/15/19 141 lb 9.6 oz (64.2 kg)  10/10/19 136 lb 9.6 oz (62 kg)  08/05/19 144 lb (65.3 kg)     He was last seen for hypertension 1 years ago.  BP at that visit was 241/114. Management since that visit includes resume Lasix, Amlodipine, Labetalol. Daughter reports that his Blood pressure has been running higher but doesn't have the numbers.   He reports good compliance with treatment. He is not having side effects.  He is following a Regular diet. He is not exercising. He does smoke.  Symptoms:  YES NO    []    [x]    Chest Pain   []    [x]    Chest pressure/discomfort   []    [x]    Palpitations   []    [x]    Dyspnea   []    [x]    Orthopnea   []    [x]    Paroxysmal nocturnal dyspnea   []   [x]    Lower extremity edema   []    [x]   Syncope   Pertinent labs: Lab Results  Component Value Date   CHOL 129 10/15/2019   HDL 52 10/15/2019   LDLCALC 60 10/15/2019   TRIG 91 10/15/2019   CHOLHDL 2.5 10/15/2019   Lab Results  Component Value Date   NA 144 09/26/2019   K 4.5 09/26/2019   CO2 32 09/26/2019   GLUCOSE 80 09/26/2019   BUN 32 (H) 09/26/2019   CREATININE 2.58 (H) 09/26/2019   CALCIUM 9.6 09/26/2019   GFRNONAA 26 (L) 09/26/2019   GFRAA 30 (L) 09/26/2019     The ASCVD Risk score (Goff DC Jr., et al., 2013) failed to calculate for the following reasons:   The patient has a prior MI or stroke diagnosis   --------------------------------------------------------------------------------------------------- XKG:YJEHUDJS  ID. He has just saw ID last Thursday.   Dementia: patient on Aricept. Reports that some days we just don't know who is who. Per daughter he is worsening.  Hemorrhoids present per aide with no bleeding. Patient denies pain  Patient's aide is requesting something for pain. Patient does not request prns  She is also requesting physical therapy for him to be done at [facility]. For deconditioning and slow gait    Patient Active Problem List   Diagnosis Date Noted  . History of substance abuse ([place]) 09/17/2018  . Urinary incontinence 09/17/2018  . Bilateral primary osteoarthritis of knee 09/13/2018  . Decreased activities of daily living (ADL) 09/13/2018  . Alcohol abuse 11/24/2014  . Dementia associated with other underlying disease with behavioral disturbance ([place]) 07/02/2014  . History of CVA (cerebrovascular accident) 07/02/2014  . CKD (chronic kidney disease), stage III 11/20/2008  . Human immunodeficiency virus (HIV) disease ([place]) 08/17/2008  . HLD (hyperlipidemia) 06/26/2008  . Essential hypertension 06/24/2008   Social History   Tobacco Use  . Smoking status: Current Every Day Smoker    Packs/day: 0.50    Years: 40.00    Pack years: 20.00    Types: Cigarettes  .  Smokeless tobacco: Never Used  . Tobacco comment: Quit  x 2 week.  Substance Use Topics  . Alcohol use: Yes    Alcohol/week: 0.0 standard drinks    Comment: drink beer  . Drug use: Yes    Frequency: 7.0 times per week    Types: Marijuana    Comment: use Marijuana every day per pt.       Medications: Outpatient Medications Prior to Visit  Medication Sig  . amLODipine (NORVASC) 10 MG tablet Take 1 tablet (10 mg total) by mouth daily.  Marland Kitchen atorvastatin (LIPITOR) 20 MG tablet Take 1 tablet (20 mg total) by mouth daily.  . bictegravir-emtricitabine-tenofovir AF (BIKTARVY) 50-200-25 MG TABS tablet Take 1 tablet by mouth daily.  . divalproex (DEPAKOTE SPRINKLE) 125 MG capsule Take 2 capsules (250 mg total)  by mouth every 12 (twelve) hours.  Marland Kitchen donepezil (ARICEPT) 10 MG tablet Take 1/2 tablet daily for 2 weeks, then increase to 1 tablet daily (Patient taking differently: Take 10 mg by mouth daily. )  . labetalol (NORMODYNE) 100 MG tablet Take 1 tablet (100 mg total) by mouth 2 (two) times daily.  Marland Kitchen OLANZapine (ZYPREXA) 5 MG tablet Take 1 tablet (5 mg total) by mouth daily as needed (agitation).  . Vitamins/Minerals TABS Take by mouth.   No facility-administered medications prior to visit.    Review of Systems  Last CBC Lab Results  Component Value Date   WBC 6.1 09/26/2019   HGB 13.1 (L) 09/26/2019   HCT 39.1 09/26/2019   MCV 95.8 09/26/2019   MCH 32.1 09/26/2019   RDW 14.1 09/26/2019   PLT 158 60/63/0160   Last metabolic panel Lab Results  Component Value Date   GLUCOSE 80 09/26/2019   NA 144 09/26/2019   K 4.5 09/26/2019   CL 108 09/26/2019   CO2 32 09/26/2019   BUN 32 (H) 09/26/2019   CREATININE 2.58 (H) 09/26/2019   GFRNONAA 26 (L) 09/26/2019   GFRAA 30 (L) 09/26/2019   CALCIUM 9.6 09/26/2019   PROT 7.3 09/26/2019   ALBUMIN 3.8 07/23/2019   BILITOT 0.3 09/26/2019   ALKPHOS 65 07/23/2019   AST 17 09/26/2019   ALT 14 09/26/2019   ANIONGAP 9 07/23/2019   Last lipids Lab Results  Component Value Date   CHOL 129 10/15/2019   HDL 52 10/15/2019   LDLCALC 60 10/15/2019   TRIG 91 10/15/2019   CHOLHDL 2.5 10/15/2019   Last hemoglobin A1c Lab Results  Component Value Date   HGBA1C 4.5 (L) 08/24/2017   Last thyroid functions Lab Results  Component Value Date   TSH 0.516 02/04/2010       Objective    BP (!) 147/90 (BP Location: Left Arm, Patient Position: Sitting, Cuff Size: Normal)   Pulse 69   Temp (!) 97.3 F (36.3 C) (Temporal)   Resp 16   Wt 141 lb 9.6 oz (64.2 kg)   BMI 24.31 kg/m  BP Readings from Last 3 Encounters:  10/15/19 (!) 147/90  10/10/19 (!) 158/91  09/10/19 (!) 155/88   Wt Readings from Last 3 Encounters:  10/15/19 141 lb 9.6 oz  (64.2 kg)  10/10/19 136 lb 9.6 oz (62 kg)  08/05/19 144 lb (65.3 kg)      Physical Exam Vitals reviewed.  Constitutional:      General: He is not in acute distress.    Appearance: Normal appearance. He is not diaphoretic.  HENT:     Head: Normocephalic and atraumatic.  Eyes:  General: No scleral icterus.    Conjunctiva/sclera: Conjunctivae normal.  Cardiovascular:     Rate and Rhythm: Normal rate and regular rhythm.     Pulses: Normal pulses.     Heart sounds: Normal heart sounds. No murmur.  Pulmonary:     Effort: Pulmonary effort is normal. No respiratory distress.     Breath sounds: Normal breath sounds. No wheezing or rhonchi.  Abdominal:     General: There is no distension.     Palpations: Abdomen is soft.     Tenderness: There is no abdominal tenderness.  Musculoskeletal:     Cervical back: Neck supple.     Right lower leg: No edema.     Left lower leg: No edema.  Lymphadenopathy:     Cervical: No cervical adenopathy.  Skin:    General: Skin is warm and dry.     Capillary Refill: Capillary refill takes less than 2 seconds.     Findings: No rash.  Neurological:     Mental Status: He is alert and oriented to person, place, and time. Mental status is at baseline.     Comments: Walking with cane  Psychiatric:        Mood and Affect: Mood normal.        Behavior: Behavior normal.       Results for orders placed or performed in visit on 10/15/19  Lipid panel  Result Value Ref Range   Cholesterol, Total 129 100 - 199 mg/dL   Triglycerides 91 0 - 149 mg/dL   HDL 52 >39 mg/dL   VLDL Cholesterol Cal 17 5 - 40 mg/dL   LDL Chol Calc (NIH) 60 0 - 99 mg/dL   Chol/HDL Ratio 2.5 0.0 - 5.0 ratio    Assessment & Plan     Problem List Items Addressed This Visit      Cardiovascular and Mediastinum   Essential hypertension (Chronic)    Blood pressure elevated today No red flag symptoms Continue amlodipine Increase lisinopril to 20 mg daily Recheck metabolic  panel Follow-up in 3 months Continue to monitor at facility      Relevant Medications   lisinopril (ZESTRIL) 20 MG tablet     Nervous and Auditory   Dementia associated with other underlying disease with behavioral disturbance (HCC)    Chronic and stable Followed by neurology Continue Aricept        Genitourinary   CKD (chronic kidney disease), stage III - Primary    Chronic and stable with creatinine between 2 and 3 Avoid NSAIDs Encourage hydration Recheck metabolic panel      Relevant Orders   Ambulatory referral to Nephrology     Other   Human immunodeficiency virus (HIV) disease (HCC) (Chronic)    Chronic and stable Continue Biktarvy Followed by ID No signs of opportunistic infection      HLD (hyperlipidemia) (Chronic)    Continue atorvastatin 20 mg daily Recheck lipid panel Goal LDL less than 70 As dementia gets more progressive, may consider whether there is actual value added from this medication      Relevant Medications   lisinopril (ZESTRIL) 20 MG tablet   Other Relevant Orders   Lipid panel (Completed)   Urinary incontinence    Related to advanced dementia Rx signed for supplies at facility       Other Visit Diagnoses    Physical deconditioning       Relevant Orders   Ambulatory referral to Physical Therapy   External hemorrhoids  Relevant Medications   lisinopril (ZESTRIL) 20 MG tablet      Return in about 3 months (around 01/14/2020) for chronic disease f/u.      I, Lavon Paganini, MD, have reviewed all documentation for this visit. The documentation on 10/16/19 for the exam, diagnosis, procedures, and orders are all accurate and complete.   Pegge Cumberledge, Dionne Bucy, MD, MPH [facility]

## 2019-10-15 ENCOUNTER — Encounter: Payer: Self-pay | Admitting: Family Medicine

## 2019-10-15 ENCOUNTER — Ambulatory Visit (INDEPENDENT_AMBULATORY_CARE_PROVIDER_SITE_OTHER): Payer: Medicare HMO | Admitting: Family Medicine

## 2019-10-15 ENCOUNTER — Other Ambulatory Visit: Payer: Self-pay

## 2019-10-15 VITALS — BP 147/90 | HR 69 | Temp 97.3°F | Resp 16 | Wt 141.6 lb

## 2019-10-15 DIAGNOSIS — B20 Human immunodeficiency virus [HIV] disease: Secondary | ICD-10-CM | POA: Diagnosis not present

## 2019-10-15 DIAGNOSIS — E782 Mixed hyperlipidemia: Secondary | ICD-10-CM | POA: Diagnosis not present

## 2019-10-15 DIAGNOSIS — F0281 Dementia in other diseases classified elsewhere with behavioral disturbance: Secondary | ICD-10-CM

## 2019-10-15 DIAGNOSIS — I10 Essential (primary) hypertension: Secondary | ICD-10-CM

## 2019-10-15 DIAGNOSIS — K644 Residual hemorrhoidal skin tags: Secondary | ICD-10-CM

## 2019-10-15 DIAGNOSIS — N1832 Chronic kidney disease, stage 3b: Secondary | ICD-10-CM | POA: Diagnosis not present

## 2019-10-15 DIAGNOSIS — R32 Unspecified urinary incontinence: Secondary | ICD-10-CM

## 2019-10-15 DIAGNOSIS — F02818 Dementia in other diseases classified elsewhere, unspecified severity, with other behavioral disturbance: Secondary | ICD-10-CM

## 2019-10-15 DIAGNOSIS — R5381 Other malaise: Secondary | ICD-10-CM

## 2019-10-15 MED ORDER — PREPARATION H 0.25-14-74.9 % RE OINT: 1.0000 "application " | TOPICAL_OINTMENT | Freq: Two times a day (BID) | RECTAL | 1 refills | Status: DC | PRN

## 2019-10-15 MED ORDER — LISINOPRIL 20 MG PO TABS: 20.0000 mg | ORAL_TABLET | Freq: Every day | ORAL | 3 refills | Status: DC

## 2019-10-15 MED ORDER — ACETAMINOPHEN 500 MG PO TABS: 500.0000 mg | ORAL_TABLET | Freq: Two times a day (BID) | ORAL | 11 refills | Status: DC

## 2019-10-16 ENCOUNTER — Telehealth: Payer: Self-pay

## 2019-10-16 ENCOUNTER — Other Ambulatory Visit: Payer: Self-pay | Admitting: Pharmacist

## 2019-10-16 DIAGNOSIS — B20 Human immunodeficiency virus [HIV] disease: Secondary | ICD-10-CM

## 2019-10-16 LAB — LIPID PANEL
Chol/HDL Ratio: 2.5 ratio (ref 0.0–5.0)
Cholesterol, Total: 129 mg/dL (ref 100–199)
HDL: 52 mg/dL (ref >39)
LDL Chol Calc (NIH): 60 mg/dL (ref 0–99)
Triglycerides: 91 mg/dL (ref 0–149)
VLDL Cholesterol Cal: 17 mg/dL (ref 5–40)

## 2019-10-16 NOTE — Assessment & Plan Note (Signed)
Continue atorvastatin 20 mg daily Recheck lipid panel Goal LDL less than 70 As dementia gets more progressive, may consider whether there is actual value added from this medication

## 2019-10-16 NOTE — Telephone Encounter (Signed)
Pt's daughter Samul Dada advised.  (On DPR)   Thanks,   -Mickel Baas

## 2019-10-16 NOTE — Assessment & Plan Note (Signed)
Chronic and stable Followed by neurology Continue Aricept

## 2019-10-16 NOTE — Telephone Encounter (Signed)
-----   Message from [name], MD sent at 10/16/2019  9:12 AM EDT ----- Normal labs

## 2019-10-16 NOTE — Assessment & Plan Note (Signed)
Related to advanced dementia Rx signed for supplies at facility

## 2019-10-16 NOTE — Assessment & Plan Note (Signed)
Chronic and stable with creatinine between 2 and 3 Avoid NSAIDs Encourage hydration Recheck metabolic panel

## 2019-10-16 NOTE — Assessment & Plan Note (Signed)
Blood pressure elevated today No red flag symptoms Continue amlodipine Increase lisinopril to 20 mg daily Recheck metabolic panel Follow-up in 3 months Continue to monitor at facility

## 2019-10-16 NOTE — Assessment & Plan Note (Signed)
Chronic and stable Continue Biktarvy Followed by ID No signs of opportunistic infection

## 2019-10-18 ENCOUNTER — Telehealth: Payer: Self-pay | Admitting: Family Medicine

## 2019-10-18 DIAGNOSIS — F02818 Dementia in other diseases classified elsewhere, unspecified severity, with other behavioral disturbance: Secondary | ICD-10-CM

## 2019-10-18 DIAGNOSIS — R5381 Other malaise: Secondary | ICD-10-CM

## 2019-10-18 DIAGNOSIS — F0281 Dementia in other diseases classified elsewhere with behavioral disturbance: Secondary | ICD-10-CM

## 2019-10-18 NOTE — Telephone Encounter (Signed)
Pt's daughter Samul Dada and care director Juluis Rainier at  [facility] are requesting [facility] home health referral for physical therapy. They do not have in house PT

## 2019-10-21 MED FILL — DIVALPROEX SODIUM 125 MG CA: 125 | 15 days supply | Qty: 60 | Fill #1

## 2019-10-21 MED FILL — OLANZapine 5 MG TABS: 5 | 30 days supply | Qty: 30 | Fill #1

## 2019-10-21 MED FILL — BIKTARVY 50-200-25 MG TABS: 50-200-25 | 30 days supply | Qty: 30 | Fill #0

## 2019-10-21 MED FILL — AMLODIPINE BESYLATE 10 MG T: 10 | 30 days supply | Qty: 30 | Fill #1

## 2019-10-21 MED FILL — DONEPEZIL HCL 10 MG TABLET: 10 | 30 days supply | Qty: 30 | Fill #3

## 2019-10-21 NOTE — Telephone Encounter (Signed)
Referral sent 

## 2019-10-21 NOTE — Telephone Encounter (Signed)
Ok to place referral to home health for PT.  Can put in comments that they are requesting [facility].

## 2019-10-22 NOTE — Telephone Encounter (Signed)
Referral is still under PT and not home health care. Can you fix so home health care agency will accept.Put PT in disciplines,Thanks

## 2019-10-22 NOTE — Addendum Note (Signed)
Addended by: Shawna Orleans on: 10/22/2019 01:57 PM   Modules accepted: Orders

## 2019-11-19 ENCOUNTER — Other Ambulatory Visit (HOSPITAL_COMMUNITY): Payer: Self-pay | Admitting: Nephrology

## 2019-11-19 ENCOUNTER — Other Ambulatory Visit: Payer: Self-pay | Admitting: Nephrology

## 2019-11-19 DIAGNOSIS — I129 Hypertensive chronic kidney disease with stage 1 through stage 4 chronic kidney disease, or unspecified chronic kidney disease: Secondary | ICD-10-CM | POA: Insufficient documentation

## 2019-11-19 DIAGNOSIS — R809 Proteinuria, unspecified: Secondary | ICD-10-CM | POA: Insufficient documentation

## 2019-11-19 DIAGNOSIS — N189 Chronic kidney disease, unspecified: Secondary | ICD-10-CM

## 2019-11-19 DIAGNOSIS — N184 Chronic kidney disease, stage 4 (severe): Secondary | ICD-10-CM

## 2019-11-19 MED FILL — DIVALPROEX SODIUM 125 MG CA: 125 | 15 days supply | Qty: 60 | Fill #2

## 2019-11-19 MED FILL — DONEPEZIL HCL 10 MG TABLET: 10 | 30 days supply | Qty: 30 | Fill #4

## 2019-11-19 MED FILL — hydrALAZINE HCL 25 MG TABS: 25 | 30 days supply | Qty: 60 | Fill #0

## 2019-11-19 MED FILL — OLANZapine 5 MG TABS: 5 | 30 days supply | Qty: 30 | Fill #2

## 2019-11-19 MED FILL — AMLODIPINE BESYLATE 10 MG T: 10 | 30 days supply | Qty: 30 | Fill #2

## 2019-11-19 MED FILL — BIKTARVY 50-200-25 MG TABS: 50-200-25 | 30 days supply | Qty: 30 | Fill #1

## 2019-11-25 ENCOUNTER — Other Ambulatory Visit: Payer: Self-pay | Admitting: Physician Assistant

## 2019-11-28 ENCOUNTER — Other Ambulatory Visit: Payer: Self-pay

## 2019-11-28 ENCOUNTER — Ambulatory Visit (HOSPITAL_COMMUNITY)
Admission: RE | Admit: 2019-11-28 | Discharge: 2019-11-28 | Disposition: A | Discharge status: Home / Self Care | Payer: Medicare HMO | Source: Ambulatory Visit | Attending: Nephrology | Admitting: Nephrology

## 2019-11-28 DIAGNOSIS — N189 Chronic kidney disease, unspecified: Secondary | ICD-10-CM

## 2019-11-28 DIAGNOSIS — N184 Chronic kidney disease, stage 4 (severe): Secondary | ICD-10-CM | POA: Diagnosis present

## 2019-12-11 ENCOUNTER — Other Ambulatory Visit: Payer: Self-pay | Admitting: Physician Assistant

## 2019-12-11 NOTE — Telephone Encounter (Signed)
Requested medication (s) are due for refill today - yes  Requested medication (s) are on the active medication list -yes  Future visit scheduled -yes  Last refill: 09/27/19 2RF  Notes to clinic: Request for non delegated Rx  Requested Prescriptions  Pending Prescriptions Disp Refills   divalproex (DEPAKOTE SPRINKLE) 125 MG capsule [Pharmacy Med Name: DIVALPROEX SODIUM 125 MG CA 125 Capsule Delayed Release Sprinkle] 60 capsule 2    Sig: Take 2 capsules (250 mg total) by mouth every 12 (twelve) hours.      Not Delegated - Neurology:  Anticonvulsants - Valproates Failed - 12/11/2019 11:25 AM      Failed - This refill cannot be delegated      Failed - HGB in normal range and within 360 days    Hemoglobin  Date Value Ref Range Status  09/26/2019 13.1 (L) 13.2 - 17.1 g/dL Final          Failed - Valproic Acid (serum) in normal range and within 360 days    No results found for: VALPROATE, VPAT        Passed - AST in normal range and within 360 days    AST  Date Value Ref Range Status  09/26/2019 17 10 - 35 U/L Final          Passed - ALT in normal range and within 360 days    ALT  Date Value Ref Range Status  09/26/2019 14 9 - 46 U/L Final          Passed - PLT in normal range and within 360 days    Platelets  Date Value Ref Range Status  09/26/2019 158 140 - 400 Thousand/uL Final          Passed - WBC in normal range and within 360 days    WBC  Date Value Ref Range Status  09/26/2019 6.1 3.8 - 10.8 Thousand/uL Final          Passed - HCT in normal range and within 360 days    HCT  Date Value Ref Range Status  09/26/2019 39.1 38 - 50 % Final          Passed - Valid encounter within last 12 months    Recent Outpatient Visits           1 month ago Stage 3b chronic kidney disease   [facility] [place], Dionne Bucy, MD   6 months ago Dementia associated with other underlying disease with behavioral disturbance [facility])   [place]  Bacigal[place], Dionne Bucy, MD   1 year ago Essential hypertension   [place], Dionne Bucy, MD       Future Appointments             In 1 month Bacigalupo, Dionne Bucy, MD [facility]                Requested Prescriptions  Pending Prescriptions Disp Refills   divalproex (DEPAKOTE SPRINKLE) 125 MG capsule [Pharmacy Med Name: DIVALPROEX SODIUM 125 MG CA 125 Capsule Delayed Release Sprinkle] 60 capsule 2    Sig: Take 2 capsules (250 mg total) by mouth every 12 (twelve) hours.      Not Delegated - Neurology:  Anticonvulsants - Valproates Failed - 12/11/2019 11:25 AM      Failed - This refill cannot be delegated      Failed - HGB in normal range and within 360 days    Hemoglobin  Date  Value Ref Range Status  09/26/2019 13.1 (L) 13.2 - 17.1 g/dL Final          Failed - Valproic Acid (serum) in normal range and within 360 days    No results found for: VALPROATE, VPAT        Passed - AST in normal range and within 360 days    AST  Date Value Ref Range Status  09/26/2019 17 10 - 35 U/L Final          Passed - ALT in normal range and within 360 days    ALT  Date Value Ref Range Status  09/26/2019 14 9 - 46 U/L Final          Passed - PLT in normal range and within 360 days    Platelets  Date Value Ref Range Status  09/26/2019 158 140 - 400 Thousand/uL Final          Passed - WBC in normal range and within 360 days    WBC  Date Value Ref Range Status  09/26/2019 6.1 3.8 - 10.8 Thousand/uL Final          Passed - HCT in normal range and within 360 days    HCT  Date Value Ref Range Status  09/26/2019 39.1 38 - 50 % Final          Passed - Valid encounter within last 12 months    Recent Outpatient Visits           1 month ago Stage 3b chronic kidney disease   [facility] [place], Dionne Bucy, MD   6 months ago Dementia associated with other underlying disease with behavioral disturbance [facility])    [place] Bacigalupo, Dionne Bucy, MD   1 year ago Essential hypertension   [place], Dionne Bucy, MD       Future Appointments             In 1 month Bacigalupo, Dionne Bucy, MD [facility], [facility]

## 2019-12-12 MED FILL — DIVALPROEX SODIUM 125 MG CA: 125 | 15 days supply | Qty: 60 | Fill #0

## 2019-12-17 MED FILL — ATORVASTATIN 20 MG TABLET: 20 | 90 days supply | Qty: 90 | Fill #0

## 2019-12-17 MED FILL — AMLODIPINE BESYLATE 10 MG T: 10 | 30 days supply | Qty: 30 | Fill #3

## 2019-12-17 MED FILL — hydrALAZINE HCL 25 MG TABS: 25 | 30 days supply | Qty: 60 | Fill #1

## 2019-12-17 MED FILL — DONEPEZIL HCL 10 MG TABLET: 10 | 30 days supply | Qty: 30 | Fill #5

## 2019-12-17 MED FILL — OLANZapine 5 MG TABS: 5 | 30 days supply | Qty: 30 | Fill #3

## 2019-12-17 MED FILL — BIKTARVY 50-200-25 MG TABS: 50-200-25 | 30 days supply | Qty: 30 | Fill #2

## 2019-12-20 ENCOUNTER — Telehealth: Payer: Self-pay

## 2019-12-20 NOTE — Telephone Encounter (Signed)
Copied from [place] [id number]. Topic: General - Other >> Dec 20, 2019  9:20 AM Oneta Rack wrote: Benjamine Mola  from [facility]- [phone] fax # [phone] preoperative medical clearance form faxed to  [phone] please note when received

## 2019-12-24 NOTE — Telephone Encounter (Signed)
Please review

## 2019-12-25 NOTE — Telephone Encounter (Signed)
Spoke with [employer].  They are going to try and email me the form.    Thanks,   -Mickel Baas

## 2019-12-25 NOTE — Telephone Encounter (Signed)
I checked Dr. Nancy Nordmann box and the pre-op form that was received via fax on 12/20/2019 is in Dr. Sharmaine Base box. Since Dr. Jacinto Reap is out of the office can another provider complete form? If so, does pt need to be schedule for OV? Please advise. Thanks TNP

## 2019-12-26 NOTE — Telephone Encounter (Signed)
Apt with Adriana on 01/01/2020.  Thanks,   -Mickel Baas

## 2019-12-31 NOTE — Progress Notes (Signed)
Established patient visit   Patient: Peter Cox   DOB: [birth year]   59 y.o. Male  MRN: [id number] Visit Date: 01/01/2020  Today's healthcare provider: Trinna Post, PA-C   Chief Complaint  Patient presents with  . Pre-op Exam   Subjective    HPI   Preoperative Visit Patient presents today for preoperative medica clearance per [facility]. He is to have all of his teeth removed for permanent dentures. He has a history of HTN, CKD IV, HIV, dementia, and CVA.   Patient reports he has had prior operations without issue. He has not had issues with anesthetic. He does not have sleep apnea. He is followed by nephrology and infectious disease for CKD and HIV, respectively. As of 09/26/2019 his CKD was stable and his helper T cells were in normal range. His HIV RNA at the time was <20. He had a visit with infectious disease on 09/26/2019 and was instructed to continue biktarvy. He denies any chest pain, SOB, recent respiratory infections.      Medications: Outpatient Medications Prior to Visit  Medication Sig  . acetaminophen (TYLENOL) 500 MG tablet Take 1 tablet (500 mg total) by mouth in the morning and at bedtime.  Marland Kitchen amLODipine (NORVASC) 10 MG tablet Take 1 tablet (10 mg total) by mouth daily.  Marland Kitchen atorvastatin (LIPITOR) 20 MG tablet TAKE 1 TABLET (20 MG TOTAL) BY MOUTH DAILY.  . bictegravir-emtricitabine-tenofovir AF (BIKTARVY) 50-200-25 MG TABS tablet Take 1 tablet by mouth daily.  . divalproex (DEPAKOTE SPRINKLE) 125 MG capsule TAKE 2 CAPSULES (250 MG TOTAL) BY MOUTH EVERY 12 (TWELVE) HOURS.  Marland Kitchen donepezil (ARICEPT) 10 MG tablet Take 1/2 tablet daily for 2 weeks, then increase to 1 tablet daily (Patient taking differently: Take 10 mg by mouth daily. )  . labetalol (NORMODYNE) 100 MG tablet Take 1 tablet (100 mg total) by mouth 2 (two) times daily.  Marland Kitchen lisinopril (ZESTRIL) 20 MG tablet Take 1 tablet (20 mg total) by mouth daily.  Marland Kitchen  OLANZapine (ZYPREXA) 5 MG tablet Take 1 tablet (5 mg total) by mouth daily as needed (agitation).  . phenylephrine-shark liver oil-mineral oil-petrolatum (PREPARATION H) 0.25-14-74.9 % rectal ointment Place 1 application rectally 2 (two) times daily as needed for hemorrhoids.  . Vitamins/Minerals TABS Take by mouth.   No facility-administered medications prior to visit.    Review of Systems    Objective    BP (!) 161/86   Pulse 60   Temp 98 F (36.7 C)   Wt 136 lb (61.7 kg)   BMI 23.34 kg/m    Physical Exam Constitutional:      Appearance: Normal appearance.  Cardiovascular:     Rate and Rhythm: Normal rate.     Heart sounds: Normal heart sounds.  Pulmonary:     Effort: Pulmonary effort is normal.     Breath sounds: Normal breath sounds.  Skin:    General: Skin is warm and dry.  Neurological:     Mental Status: He is alert and oriented to person, place, and time. Mental status is at baseline.  Psychiatric:        Mood and Affect: Mood normal.        Behavior: Behavior normal.       No results found for any visits on 01/01/20.  Assessment & Plan    1. Pre-operative clearance  Patient presents a moderate to high surgical risk. He has well controlled HIV though has poorly  controlled HTN which has in part contributed to his stage IV CKD. He has dementia and is a poor historian today. He is partially dependent with his ADLs and lives in a continued care setting. His BP is currently uncontrolled today. He is followed by nephrology. Would recommend better BP control prior to surgery with clearance by his nephrologist and infectious disease doctor.   - EKG 12-Lead  2. Dementia associated with other underlying disease with behavioral disturbance ([place])   3. Essential hypertension   4. CKD (chronic kidney disease), stage IV (HCC)     No follow-ups on file.      ITrinna Post, PA-C, have reviewed all documentation for this visit. The documentation on 01/10/20  for the exam, diagnosis, procedures, and orders are all accurate and complete.  I have spent 25 minutes with this patient, >50% of which was spent on counseling and coordination of care.     Paulene Floor  [facility] [phone] (phone) [phone] (fax)  [place]

## 2020-01-01 ENCOUNTER — Other Ambulatory Visit: Payer: Self-pay | Admitting: Nephrology

## 2020-01-01 ENCOUNTER — Ambulatory Visit (INDEPENDENT_AMBULATORY_CARE_PROVIDER_SITE_OTHER): Payer: Medicare HMO | Admitting: Physician Assistant

## 2020-01-01 ENCOUNTER — Encounter: Payer: Self-pay | Admitting: Physician Assistant

## 2020-01-01 ENCOUNTER — Other Ambulatory Visit: Payer: Self-pay

## 2020-01-01 VITALS — BP 161/86 | HR 60 | Temp 98.0°F | Wt 136.0 lb

## 2020-01-01 DIAGNOSIS — Z01818 Encounter for other preprocedural examination: Secondary | ICD-10-CM | POA: Diagnosis not present

## 2020-01-01 DIAGNOSIS — N184 Chronic kidney disease, stage 4 (severe): Secondary | ICD-10-CM | POA: Diagnosis not present

## 2020-01-01 DIAGNOSIS — F0281 Dementia in other diseases classified elsewhere with behavioral disturbance: Secondary | ICD-10-CM | POA: Diagnosis not present

## 2020-01-01 DIAGNOSIS — I129 Hypertensive chronic kidney disease with stage 1 through stage 4 chronic kidney disease, or unspecified chronic kidney disease: Secondary | ICD-10-CM

## 2020-01-01 DIAGNOSIS — N2889 Other specified disorders of kidney and ureter: Secondary | ICD-10-CM | POA: Insufficient documentation

## 2020-01-01 DIAGNOSIS — I10 Essential (primary) hypertension: Secondary | ICD-10-CM | POA: Diagnosis not present

## 2020-01-01 DIAGNOSIS — F02818 Dementia in other diseases classified elsewhere, unspecified severity, with other behavioral disturbance: Secondary | ICD-10-CM

## 2020-01-01 DIAGNOSIS — N2581 Secondary hyperparathyroidism of renal origin: Secondary | ICD-10-CM | POA: Insufficient documentation

## 2020-01-01 DIAGNOSIS — R809 Proteinuria, unspecified: Secondary | ICD-10-CM

## 2020-01-08 ENCOUNTER — Other Ambulatory Visit (HOSPITAL_COMMUNITY): Payer: Self-pay | Admitting: Family Medicine

## 2020-01-08 ENCOUNTER — Other Ambulatory Visit: Payer: Self-pay | Admitting: Physician Assistant

## 2020-01-08 NOTE — Telephone Encounter (Signed)
Requested medication (s) are due for refill today: yes  Requested medication (s) are on the active medication list: yes  Last refill: 12/17/2019  Future visit scheduled: yes  Notes to clinic:  this refill cannot be delegated The atorvastatin is not due yet but it won't allow me to remove it    Requested Prescriptions  Pending Prescriptions Disp Refills   atorvastatin (LIPITOR) 20 MG tablet [Pharmacy Med Name: ATORVASTATIN 20 MG TABLET 20 Tablet] 90 tablet 0    Sig: TAKE 1 TABLET BY MOUTH ONCE DAILY      Cardiovascular:  Antilipid - Statins Failed - 01/08/2020  2:20 PM      Failed - LDL in normal range and within 360 days    LDL Cholesterol (Calc)  Date Value Ref Range Status  09/04/2018 127 (H) mg/dL (calc) Final    Comment:    Reference range: <100 . Desirable range <100 mg/dL for primary prevention;   <70 mg/dL for patients with CHD or diabetic patients  with > or = 2 CHD risk factors. Marland Kitchen LDL-C is now calculated using the Martin-Hopkins  calculation, which is a validated novel method providing  better accuracy than the Friedewald equation in the  estimation of LDL-C.  Cresenciano Genre et al. Annamaria Helling. 6578;469(62): 2061-2068  (http://education.[employer].com/faq/FAQ164)    LDL Chol Calc (NIH)  Date Value Ref Range Status  10/15/2019 60 0 - 99 mg/dL Final          Passed - Total Cholesterol in normal range and within 360 days    Cholesterol, Total  Date Value Ref Range Status  10/15/2019 129 100 - 199 mg/dL Final          Passed - HDL in normal range and within 360 days    HDL  Date Value Ref Range Status  10/15/2019 52 >39 mg/dL Final          Passed - Triglycerides in normal range and within 360 days    Triglycerides  Date Value Ref Range Status  10/15/2019 91 0 - 149 mg/dL Final          Passed - Patient is not pregnant      Passed - Valid encounter within last 12 months    Recent Outpatient Visits           1 week ago Pre-operative clearance    [place], Adriana M, PA-C   2 months ago Stage 3b chronic kidney disease   [facility], Dionne Bucy, MD   7 months ago Dementia associated with other underlying disease with behavioral disturbance [facility])   [facility] Bacigal[place], Dionne Bucy, MD   1 year ago Essential hypertension   [place], Dionne Bucy, MD       Future Appointments             In 4 weeks Bacigalupo, Dionne Bucy, MD [facility], [facility]              OLANZapine (ZYPREXA) 5 MG tablet [Pharmacy Med Name: OLANZapine 5 MG TABS 5 Tablet] 30 tablet 3    Sig: Take 1 tablet (5 mg total) by mouth daily as needed (agitation).      Not Delegated - Psychiatry:  Antipsychotics - Second Generation (Atypical) - olanzapine Failed - 01/08/2020  2:20 PM      Failed - This refill cannot be delegated      Failed - Last BP in normal range    BP Readings  from Last 1 Encounters:  01/01/20 (!) 161/86          Passed - ALT in normal range and within 360 days    ALT  Date Value Ref Range Status  09/26/2019 14 9 - 46 U/L Final          Passed - AST in normal range and within 360 days    AST  Date Value Ref Range Status  09/26/2019 17 10 - 35 U/L Final          Passed - Valid encounter within last 6 months    Recent Outpatient Visits           1 week ago Pre-operative clearance   [place], PA-C   2 months ago Stage 3b chronic kidney disease   [facility], Dionne Bucy, MD   7 months ago Dementia associated with other underlying disease with behavioral disturbance [facility])   [facility] Bac[place], Dionne Bucy, MD   1 year ago Essential hypertension   [place], Dionne Bucy, MD       Future Appointments             In 4 weeks Bacigalupo, Dionne Bucy, MD [facility], [place]

## 2020-01-09 MED FILL — hydrALAZINE HCL 25 MG TABS: 25 | 30 days supply | Qty: 60 | Fill #2

## 2020-01-09 MED FILL — BIKTARVY 50-200-25 MG TABS: 50-200-25 | 30 days supply | Qty: 30 | Fill #3

## 2020-01-09 MED FILL — DONEPEZIL HCL 10 MG TABLET: 10 | 30 days supply | Qty: 30 | Fill #6

## 2020-01-09 MED FILL — AMLODIPINE BESYLATE 10 MG T: 10 | 30 days supply | Qty: 30 | Fill #4

## 2020-01-10 ENCOUNTER — Telehealth: Payer: Self-pay | Admitting: Physician Assistant

## 2020-01-10 NOTE — Telephone Encounter (Signed)
Filled out surgical clearance form. Can we fax most recent note, EKG, and form to his dentist? Thanks.

## 2020-01-10 NOTE — Telephone Encounter (Signed)
Faxed

## 2020-01-16 ENCOUNTER — Ambulatory Visit: Payer: Medicare HMO | Admitting: Family Medicine

## 2020-01-21 ENCOUNTER — Ambulatory Visit: Payer: Medicare HMO | Attending: Nephrology

## 2020-01-27 MED FILL — DIVALPROEX SODIUM 125 MG CA: 125 | 15 days supply | Qty: 60 | Fill #1

## 2020-01-30 ENCOUNTER — Ambulatory Visit: Payer: Medicare HMO | Admitting: Urology

## 2020-01-31 ENCOUNTER — Encounter: Payer: Self-pay | Admitting: Urology

## 2020-02-04 DIAGNOSIS — R32 Unspecified urinary incontinence: Secondary | ICD-10-CM | POA: Diagnosis not present

## 2020-02-05 MED FILL — hydrALAZINE HCL 25 MG TABS: 25 | 30 days supply | Qty: 60 | Fill #3

## 2020-02-05 MED FILL — AMLODIPINE BESYLATE 10 MG T: 10 | 30 days supply | Qty: 30 | Fill #5

## 2020-02-05 MED FILL — DONEPEZIL HCL 10 MG TABLET: 10 | 30 days supply | Qty: 30 | Fill #7

## 2020-02-05 MED FILL — BIKTARVY 50-200-25 MG TABS: 50-200-25 | 30 days supply | Qty: 30 | Fill #4

## 2020-02-05 MED FILL — OLANZapine 5 MG TABS: 5 | 30 days supply | Qty: 30 | Fill #0

## 2020-02-06 ENCOUNTER — Encounter: Payer: Self-pay | Admitting: Family Medicine

## 2020-02-06 ENCOUNTER — Ambulatory Visit (INDEPENDENT_AMBULATORY_CARE_PROVIDER_SITE_OTHER): Payer: Medicare HMO | Admitting: Family Medicine

## 2020-02-06 ENCOUNTER — Other Ambulatory Visit: Payer: Self-pay

## 2020-02-06 VITALS — BP 148/82 | HR 70 | Temp 98.2°F | Wt 138.0 lb

## 2020-02-06 DIAGNOSIS — I10 Essential (primary) hypertension: Secondary | ICD-10-CM | POA: Diagnosis not present

## 2020-02-06 DIAGNOSIS — R1310 Dysphagia, unspecified: Secondary | ICD-10-CM | POA: Insufficient documentation

## 2020-02-06 DIAGNOSIS — F02818 Dementia in other diseases classified elsewhere, unspecified severity, with other behavioral disturbance: Secondary | ICD-10-CM

## 2020-02-06 DIAGNOSIS — F0281 Dementia in other diseases classified elsewhere with behavioral disturbance: Secondary | ICD-10-CM | POA: Diagnosis not present

## 2020-02-06 DIAGNOSIS — Z789 Other specified health status: Secondary | ICD-10-CM | POA: Diagnosis not present

## 2020-02-06 DIAGNOSIS — N1832 Chronic kidney disease, stage 3b: Secondary | ICD-10-CM | POA: Diagnosis not present

## 2020-02-06 DIAGNOSIS — R5381 Other malaise: Secondary | ICD-10-CM

## 2020-02-06 DIAGNOSIS — E782 Mixed hyperlipidemia: Secondary | ICD-10-CM

## 2020-02-06 MED ORDER — LISINOPRIL 40 MG PO TABS: 40.0000 mg | ORAL_TABLET | Freq: Every day | ORAL | 1 refills | Status: DC

## 2020-02-06 MED FILL — LISINOPRIL 40 MG TABS: 40 | 90 days supply | Qty: 90 | Fill #0

## 2020-02-06 NOTE — Assessment & Plan Note (Signed)
Chronic and progressing  Pt's daughter reports he is less verbal Also is having trouble swallowing and has choked a few times.  Will refer to home heath for PT, Speech Therapy, and Nursing to help with his medications.  Continue Aricept and follow up with Neurology.

## 2020-02-06 NOTE — Assessment & Plan Note (Signed)
Previously well controlled Continue statin Reviewed FLP and CMP Goal LDL < 100

## 2020-02-06 NOTE — Assessment & Plan Note (Addendum)
BP slightly improved but still not to goal.  Will increase Lisinopril to 40mg  a day. Recheck BMP Follow up in 4 months.  Also followed by Nephrology

## 2020-02-06 NOTE — Assessment & Plan Note (Signed)
Abnormal Gait Feels unsteady at time on his feet.  Denies falls.  Will refer to [place] (Physical Therapy)

## 2020-02-06 NOTE — Assessment & Plan Note (Addendum)
New problem.  Pt's daughter has notice he is taking longer to chew and swallow food.  He is also having trouble swallowing his medications.  Will refer to home heath (Speech Therapy) to evaluate and treat.  Likely related to dementia

## 2020-02-06 NOTE — Assessment & Plan Note (Signed)
Needs assistance with dressing, bathing, and preparing food Related to advanced dementia HH PT, Nursing Consider SW f/u for possible SNF

## 2020-02-06 NOTE — Progress Notes (Signed)
I,Laura E Walsh,acting as a scribe for Lavon Paganini, MD.,have documented all relevant documentation on the behalf of Lavon Paganini, MD,as directed by  Lavon Paganini, MD while in the presence of Lavon Paganini, MD.    Established patient visit   Patient: Peter Cox   DOB: 02-17-1961   59 y.o. Male  MRN: [id number] Visit Date: 02/06/2020  Today's healthcare provider: Lavon Paganini, MD   Chief Complaint  Patient presents with  . Hypertension  . Dementia   Subjective    HPI    Hypertension, follow-up  BP Readings from Last 3 Encounters:  02/06/20 (!) 148/82  01/01/20 (!) 161/86  10/15/19 (!) 147/90   Wt Readings from Last 3 Encounters:  02/06/20 138 lb (62.6 kg)  01/01/20 136 lb (61.7 kg)  10/15/19 141 lb 9.6 oz (64.2 kg)     He was last seen for hypertension 4 months ago.  BP at that visit was 147/90. Management since that visit includes increasing lisinopril to 20mg  a day.  He reports excellent compliance with treatment. He is not having side effects.  He is following a Regular diet. He is exercising. He does smoke.  Use of agents associated with hypertension: none.   Outside blood pressures are not being checked. Symptoms: No chest pain No chest pressure  No palpitations No syncope  No dyspnea No orthopnea  No paroxysmal nocturnal dyspnea No lower extremity edema   Pertinent labs: Lab Results  Component Value Date   CHOL 129 10/15/2019   HDL 52 10/15/2019   LDLCALC 60 10/15/2019   TRIG 91 10/15/2019   CHOLHDL 2.5 10/15/2019   Lab Results  Component Value Date   NA 144 09/26/2019   K 4.5 09/26/2019   CREATININE 2.58 (H) 09/26/2019   GFRNONAA 26 (L) 09/26/2019   GFRAA 30 (L) 09/26/2019   GLUCOSE 80 09/26/2019     The ASCVD Risk score (Goff DC Jr., et al., 2013) failed to calculate for the following reasons:   The patient has a prior MI or stroke diagnosis    ---------------------------------------------------------------------------------------------------   Patient Active Problem List   Diagnosis Date Noted  . Dysphagia 02/06/2020  . Physical deconditioning 02/06/2020  . History of substance abuse ([place]) 09/17/2018  . Urinary incontinence 09/17/2018  . Bilateral primary osteoarthritis of knee 09/13/2018  . Decreased activities of daily living (ADL) 09/13/2018  . Alcohol abuse 11/24/2014  . Dementia associated with other underlying disease with behavioral disturbance ([place]) 07/02/2014  . History of CVA (cerebrovascular accident) 07/02/2014  . CKD (chronic kidney disease), stage III 11/20/2008  . Human immunodeficiency virus (HIV) disease ([place]) 08/17/2008  . HLD (hyperlipidemia) 06/26/2008  . Essential hypertension 06/24/2008   Past Medical History:  Diagnosis Date  . Alcohol abuse    heavy, clean since Peter  . Alcohol abuse 11/24/2014  . Cocaine abuse ([place])     clean since about 2000  . Drug abuse ([place]) 11/24/2014  . HIV infection ([place])    dx 08/08/2008 (unknown how he was exposed)  Initial VL 43,200 and CD4 410 on 08/28/08  . Hyperlipidemia   . Hypertension   . Marijuana abuse   . Mild cognitive impairment    likely vascular dementia/psa/?HIV componentCT Head 02/12/08 for htn crisis and concern for stroke: impression-1. Old right internal capsule and thalamic lacunar infarcts. 2. Mild to moderate chronic small vessell white matter ischemic changes in both cerebral hemisphere, greater on the right  . Neuromuscular disorder ([place])   . Seizures ([place])    "  last year had seizure"  . Stroke ([place])   . Tobacco abuse    Social History   Tobacco Use  . Smoking status: Current Every Day Smoker    Packs/day: 0.50    Years: 40.00    Pack years: 20.00    Types: Cigarettes  . Smokeless tobacco: Never Used  . Tobacco comment: Quit  x 2 week.  Vaping Use  . Vaping Use: Never used  Substance Use Topics  . Alcohol use: Yes     Alcohol/week: 0.0 standard drinks    Comment: drink beer  . Drug use: Yes    Frequency: 7.0 times per week    Types: Marijuana    Comment: use Marijuana every day per pt.   No Known Allergies   Medications: Outpatient Medications Prior to Visit  Medication Sig  . acetaminophen (TYLENOL) 500 MG tablet Take 1 tablet (500 mg total) by mouth in the morning and at bedtime.  Marland Kitchen amLODipine (NORVASC) 10 MG tablet Take 1 tablet (10 mg total) by mouth daily.  Marland Kitchen atorvastatin (LIPITOR) 20 MG tablet TAKE 1 TABLET BY MOUTH ONCE DAILY  . bictegravir-emtricitabine-tenofovir AF (BIKTARVY) 50-200-25 MG TABS tablet Take 1 tablet by mouth daily.  . divalproex (DEPAKOTE SPRINKLE) 125 MG capsule TAKE 2 CAPSULES (250 MG TOTAL) BY MOUTH EVERY 12 (TWELVE) HOURS.  Marland Kitchen donepezil (ARICEPT) 10 MG tablet Take 1/2 tablet daily for 2 weeks, then increase to 1 tablet daily (Patient taking differently: Take 10 mg by mouth daily. )  . hydrALAZINE (APRESOLINE) 25 MG tablet   . labetalol (NORMODYNE) 100 MG tablet Take 1 tablet (100 mg total) by mouth 2 (two) times daily.  Marland Kitchen OLANZapine (ZYPREXA) 5 MG tablet TAKE 1 TABLET (5 MG TOTAL) BY MOUTH DAILY AS NEEDED (AGITATION).  Marland Kitchen phenylephrine-shark liver oil-mineral oil-petrolatum (PREPARATION H) 0.25-14-74.9 % rectal ointment Place 1 application rectally 2 (two) times daily as needed for hemorrhoids.  . Vitamins/Minerals TABS Take by mouth.  . [DISCONTINUED] lisinopril (ZESTRIL) 20 MG tablet Take 1 tablet (20 mg total) by mouth daily.   No facility-administered medications prior to visit.    Review of Systems  Constitutional: Negative.   HENT: Positive for trouble swallowing (Also occasionally chokes).   Respiratory: Positive for cough. Negative for apnea, choking, chest tightness, shortness of breath, wheezing and stridor.   Cardiovascular: Negative.   Gastrointestinal: Negative.   Musculoskeletal: Positive for gait problem (Unsteady gait at times).  Neurological: Negative  for dizziness, light-headedness and headaches.      Objective    BP (!) 148/82 (BP Location: Right Arm, Patient Position: Sitting, Cuff Size: Large)   Pulse 70   Temp 98.2 F (36.8 C) (Oral)   Wt 138 lb (62.6 kg)   SpO2 98%   BMI 23.69 kg/m    Physical Exam Constitutional:      Appearance: Normal appearance.  HENT:     Head: Normocephalic and atraumatic.  Eyes:     Conjunctiva/sclera: Conjunctivae normal.  Cardiovascular:     Rate and Rhythm: Normal rate and regular rhythm.     Heart sounds: Normal heart sounds. No murmur heard.   Pulmonary:     Effort: Pulmonary effort is normal. No respiratory distress.     Breath sounds: Normal breath sounds. No wheezing.  Abdominal:     Palpations: Abdomen is soft.     Tenderness: There is no abdominal tenderness.  Musculoskeletal:     Cervical back: Neck supple.     Right lower leg: No edema.  Left lower leg: No edema.  Lymphadenopathy:     Cervical: No cervical adenopathy.  Neurological:     Mental Status: He is alert. Mental status is at baseline.  Psychiatric:        Mood and Affect: Mood and affect normal.        Behavior: Behavior is cooperative.        Cognition and Memory: Cognition is impaired. Memory is impaired.     No results found for any visits on 02/06/20.  Assessment & Plan     Problem List Items Addressed This Visit      Cardiovascular and Mediastinum   Essential hypertension - Primary (Chronic)    BP slightly improved but still not to goal.  Will increase Lisinopril to 40mg  a day. Recheck BMP Follow up in 4 months.  Also followed by Nephrology      Relevant Medications   hydrALAZINE (APRESOLINE) 25 MG tablet   lisinopril (ZESTRIL) 40 MG tablet     Digestive   Dysphagia    New problem.  Pt's daughter has notice he is taking longer to chew and swallow food.  He is also having trouble swallowing his medications.  Will refer to home heath (Speech Therapy) to evaluate and treat.  Likely  related to dementia        Nervous and Auditory   Dementia associated with other underlying disease with behavioral disturbance ([place])    Chronic and progressing  Pt's daughter reports he is less verbal Also is having trouble swallowing and has choked a few times.  Will refer to home heath for PT, Speech Therapy, and Nursing to help with his medications.  Continue Aricept and follow up with Neurology.       Relevant Orders   Ambulatory referral to Home Health     Genitourinary   CKD (chronic kidney disease), stage III    Chronic and Stable.  Followed by Nephrology Recheck BMP.      Relevant Orders   Basic Metabolic Panel (BMET)     Other   HLD (hyperlipidemia) (Chronic)    Previously well controlled Continue statin Reviewed FLP and CMP Goal LDL < 100       Relevant Medications   hydrALAZINE (APRESOLINE) 25 MG tablet   lisinopril (ZESTRIL) 40 MG tablet   Decreased activities of daily living (ADL)    Needs assistance with dressing, bathing, and preparing food Related to advanced dementia HH PT, Nursing Consider SW f/u for possible SNF      Physical deconditioning    Abnormal Gait Feels unsteady at time on his feet.  Denies falls.  Will refer to [place] (Physical Therapy)          Return in about 2 months (around 04/07/2020) for Hypertension.      I, Lavon Paganini, MD, have reviewed all documentation for this visit. The documentation on 02/06/20 for the exam, diagnosis, procedures, and orders are all accurate and complete.   Geraline Halberstadt, Dionne Bucy, MD, MPH [facility]

## 2020-02-06 NOTE — Patient Instructions (Signed)

## 2020-02-06 NOTE — Assessment & Plan Note (Signed)
Chronic and Stable.  Followed by Nephrology Recheck BMP.

## 2020-02-07 ENCOUNTER — Telehealth: Payer: Self-pay

## 2020-02-07 LAB — BASIC METABOLIC PANEL
BUN/Creatinine Ratio: 11 (ref 10–24)
BUN: 27 mg/dL (ref 8–27)
CO2: 19 mmol/L — ABNORMAL LOW (ref 20–29)
Calcium: 9.5 mg/dL (ref 8.6–10.2)
Chloride: 107 mmol/L — ABNORMAL HIGH (ref 96–106)
Creatinine, Ser: 2.43 mg/dL — ABNORMAL HIGH (ref 0.76–1.27)
GFR calc Af Amer: 32 mL/min/{1.73_m2} — ABNORMAL LOW (ref >59)
GFR calc non Af Amer: 28 mL/min/{1.73_m2} — ABNORMAL LOW (ref >59)
Glucose: 74 mg/dL (ref 65–99)
Potassium: 4.4 mmol/L (ref 3.5–5.2)
Sodium: 142 mmol/L (ref 134–144)

## 2020-02-07 NOTE — Telephone Encounter (Signed)
Patient's daughter advised as below.  °

## 2020-02-07 NOTE — Telephone Encounter (Signed)
-----   Message from [name], MD sent at 02/07/2020  8:16 AM EDT ----- Stable labs

## 2020-02-12 ENCOUNTER — Telehealth: Payer: Self-pay | Admitting: Family Medicine

## 2020-02-12 DIAGNOSIS — E782 Mixed hyperlipidemia: Secondary | ICD-10-CM | POA: Diagnosis not present

## 2020-02-12 DIAGNOSIS — F17210 Nicotine dependence, cigarettes, uncomplicated: Secondary | ICD-10-CM | POA: Diagnosis not present

## 2020-02-12 DIAGNOSIS — I129 Hypertensive chronic kidney disease with stage 1 through stage 4 chronic kidney disease, or unspecified chronic kidney disease: Secondary | ICD-10-CM | POA: Diagnosis not present

## 2020-02-12 DIAGNOSIS — B20 Human immunodeficiency virus [HIV] disease: Secondary | ICD-10-CM | POA: Diagnosis not present

## 2020-02-12 DIAGNOSIS — F0391 Unspecified dementia with behavioral disturbance: Secondary | ICD-10-CM | POA: Diagnosis not present

## 2020-02-12 DIAGNOSIS — N184 Chronic kidney disease, stage 4 (severe): Secondary | ICD-10-CM | POA: Diagnosis not present

## 2020-02-12 DIAGNOSIS — R1310 Dysphagia, unspecified: Secondary | ICD-10-CM | POA: Diagnosis not present

## 2020-02-12 DIAGNOSIS — Z79899 Other long term (current) drug therapy: Secondary | ICD-10-CM | POA: Diagnosis not present

## 2020-02-12 DIAGNOSIS — M170 Bilateral primary osteoarthritis of knee: Secondary | ICD-10-CM | POA: Diagnosis not present

## 2020-02-12 NOTE — Telephone Encounter (Signed)
Copied from [place] [id number]. Topic: Quick Communication - Home Health Verbal Orders >> Feb 12, 2020  4:27 PM Jodie Echevaria wrote: Caller/Agency: Lorella Nimrod / [place] Number: [phone] [place] [facility]  Requesting OT/PT/Skilled Nursing/Social Work/Speech Therapy: Nursing  Frequency: 1 w 4 wks every other week for disease process and management Also want to know if they should moniter his [place] level please advise

## 2020-02-13 NOTE — Telephone Encounter (Signed)
Left message advising [place].  Thanks,   -Mickel Baas

## 2020-02-13 NOTE — Telephone Encounter (Signed)
OK for verbals. Yes please monitor depakote level every 3 months for now.

## 2020-02-14 DIAGNOSIS — I129 Hypertensive chronic kidney disease with stage 1 through stage 4 chronic kidney disease, or unspecified chronic kidney disease: Secondary | ICD-10-CM | POA: Diagnosis not present

## 2020-02-14 DIAGNOSIS — N184 Chronic kidney disease, stage 4 (severe): Secondary | ICD-10-CM | POA: Diagnosis not present

## 2020-02-14 DIAGNOSIS — Z79899 Other long term (current) drug therapy: Secondary | ICD-10-CM | POA: Diagnosis not present

## 2020-02-14 DIAGNOSIS — E782 Mixed hyperlipidemia: Secondary | ICD-10-CM | POA: Diagnosis not present

## 2020-02-14 DIAGNOSIS — B20 Human immunodeficiency virus [HIV] disease: Secondary | ICD-10-CM | POA: Diagnosis not present

## 2020-02-14 DIAGNOSIS — M170 Bilateral primary osteoarthritis of knee: Secondary | ICD-10-CM | POA: Diagnosis not present

## 2020-02-14 DIAGNOSIS — F17210 Nicotine dependence, cigarettes, uncomplicated: Secondary | ICD-10-CM | POA: Diagnosis not present

## 2020-02-14 DIAGNOSIS — F0391 Unspecified dementia with behavioral disturbance: Secondary | ICD-10-CM | POA: Diagnosis not present

## 2020-02-14 DIAGNOSIS — R1310 Dysphagia, unspecified: Secondary | ICD-10-CM | POA: Diagnosis not present

## 2020-02-18 DIAGNOSIS — R1310 Dysphagia, unspecified: Secondary | ICD-10-CM | POA: Diagnosis not present

## 2020-02-18 DIAGNOSIS — F0391 Unspecified dementia with behavioral disturbance: Secondary | ICD-10-CM | POA: Diagnosis not present

## 2020-02-18 DIAGNOSIS — E782 Mixed hyperlipidemia: Secondary | ICD-10-CM | POA: Diagnosis not present

## 2020-02-18 DIAGNOSIS — B20 Human immunodeficiency virus [HIV] disease: Secondary | ICD-10-CM | POA: Diagnosis not present

## 2020-02-18 DIAGNOSIS — M170 Bilateral primary osteoarthritis of knee: Secondary | ICD-10-CM | POA: Diagnosis not present

## 2020-02-18 DIAGNOSIS — F17210 Nicotine dependence, cigarettes, uncomplicated: Secondary | ICD-10-CM | POA: Diagnosis not present

## 2020-02-18 DIAGNOSIS — N184 Chronic kidney disease, stage 4 (severe): Secondary | ICD-10-CM | POA: Diagnosis not present

## 2020-02-18 DIAGNOSIS — I129 Hypertensive chronic kidney disease with stage 1 through stage 4 chronic kidney disease, or unspecified chronic kidney disease: Secondary | ICD-10-CM | POA: Diagnosis not present

## 2020-02-18 DIAGNOSIS — Z79899 Other long term (current) drug therapy: Secondary | ICD-10-CM | POA: Diagnosis not present

## 2020-02-20 DIAGNOSIS — I129 Hypertensive chronic kidney disease with stage 1 through stage 4 chronic kidney disease, or unspecified chronic kidney disease: Secondary | ICD-10-CM | POA: Diagnosis not present

## 2020-02-20 DIAGNOSIS — Z79899 Other long term (current) drug therapy: Secondary | ICD-10-CM | POA: Diagnosis not present

## 2020-02-20 DIAGNOSIS — F17210 Nicotine dependence, cigarettes, uncomplicated: Secondary | ICD-10-CM | POA: Diagnosis not present

## 2020-02-20 DIAGNOSIS — N184 Chronic kidney disease, stage 4 (severe): Secondary | ICD-10-CM | POA: Diagnosis not present

## 2020-02-20 DIAGNOSIS — R1310 Dysphagia, unspecified: Secondary | ICD-10-CM | POA: Diagnosis not present

## 2020-02-20 DIAGNOSIS — F0391 Unspecified dementia with behavioral disturbance: Secondary | ICD-10-CM | POA: Diagnosis not present

## 2020-02-20 DIAGNOSIS — E782 Mixed hyperlipidemia: Secondary | ICD-10-CM | POA: Diagnosis not present

## 2020-02-20 DIAGNOSIS — B20 Human immunodeficiency virus [HIV] disease: Secondary | ICD-10-CM | POA: Diagnosis not present

## 2020-02-20 DIAGNOSIS — M170 Bilateral primary osteoarthritis of knee: Secondary | ICD-10-CM | POA: Diagnosis not present

## 2020-02-22 MED FILL — DIVALPROEX SODIUM 125 MG CA: 125 | 15 days supply | Qty: 60 | Fill #2

## 2020-02-25 DIAGNOSIS — E782 Mixed hyperlipidemia: Secondary | ICD-10-CM | POA: Diagnosis not present

## 2020-02-25 DIAGNOSIS — N184 Chronic kidney disease, stage 4 (severe): Secondary | ICD-10-CM | POA: Diagnosis not present

## 2020-02-25 DIAGNOSIS — Z79899 Other long term (current) drug therapy: Secondary | ICD-10-CM | POA: Diagnosis not present

## 2020-02-25 DIAGNOSIS — F0391 Unspecified dementia with behavioral disturbance: Secondary | ICD-10-CM | POA: Diagnosis not present

## 2020-02-25 DIAGNOSIS — F17210 Nicotine dependence, cigarettes, uncomplicated: Secondary | ICD-10-CM | POA: Diagnosis not present

## 2020-02-25 DIAGNOSIS — M170 Bilateral primary osteoarthritis of knee: Secondary | ICD-10-CM | POA: Diagnosis not present

## 2020-02-25 DIAGNOSIS — R1310 Dysphagia, unspecified: Secondary | ICD-10-CM | POA: Diagnosis not present

## 2020-02-25 DIAGNOSIS — B20 Human immunodeficiency virus [HIV] disease: Secondary | ICD-10-CM | POA: Diagnosis not present

## 2020-02-25 DIAGNOSIS — I129 Hypertensive chronic kidney disease with stage 1 through stage 4 chronic kidney disease, or unspecified chronic kidney disease: Secondary | ICD-10-CM | POA: Diagnosis not present

## 2020-02-27 DIAGNOSIS — R1310 Dysphagia, unspecified: Secondary | ICD-10-CM | POA: Diagnosis not present

## 2020-02-27 DIAGNOSIS — F17210 Nicotine dependence, cigarettes, uncomplicated: Secondary | ICD-10-CM | POA: Diagnosis not present

## 2020-02-27 DIAGNOSIS — N184 Chronic kidney disease, stage 4 (severe): Secondary | ICD-10-CM | POA: Diagnosis not present

## 2020-02-27 DIAGNOSIS — E782 Mixed hyperlipidemia: Secondary | ICD-10-CM | POA: Diagnosis not present

## 2020-02-27 DIAGNOSIS — I129 Hypertensive chronic kidney disease with stage 1 through stage 4 chronic kidney disease, or unspecified chronic kidney disease: Secondary | ICD-10-CM | POA: Diagnosis not present

## 2020-02-27 DIAGNOSIS — Z79899 Other long term (current) drug therapy: Secondary | ICD-10-CM | POA: Diagnosis not present

## 2020-02-27 DIAGNOSIS — F0391 Unspecified dementia with behavioral disturbance: Secondary | ICD-10-CM | POA: Diagnosis not present

## 2020-02-27 DIAGNOSIS — M170 Bilateral primary osteoarthritis of knee: Secondary | ICD-10-CM | POA: Diagnosis not present

## 2020-02-27 DIAGNOSIS — B20 Human immunodeficiency virus [HIV] disease: Secondary | ICD-10-CM | POA: Diagnosis not present

## 2020-03-02 DIAGNOSIS — F17210 Nicotine dependence, cigarettes, uncomplicated: Secondary | ICD-10-CM | POA: Diagnosis not present

## 2020-03-02 DIAGNOSIS — N184 Chronic kidney disease, stage 4 (severe): Secondary | ICD-10-CM | POA: Diagnosis not present

## 2020-03-02 DIAGNOSIS — B20 Human immunodeficiency virus [HIV] disease: Secondary | ICD-10-CM | POA: Diagnosis not present

## 2020-03-02 DIAGNOSIS — Z79899 Other long term (current) drug therapy: Secondary | ICD-10-CM | POA: Diagnosis not present

## 2020-03-02 DIAGNOSIS — F0391 Unspecified dementia with behavioral disturbance: Secondary | ICD-10-CM | POA: Diagnosis not present

## 2020-03-02 DIAGNOSIS — M170 Bilateral primary osteoarthritis of knee: Secondary | ICD-10-CM | POA: Diagnosis not present

## 2020-03-02 DIAGNOSIS — I129 Hypertensive chronic kidney disease with stage 1 through stage 4 chronic kidney disease, or unspecified chronic kidney disease: Secondary | ICD-10-CM | POA: Diagnosis not present

## 2020-03-02 DIAGNOSIS — R1310 Dysphagia, unspecified: Secondary | ICD-10-CM | POA: Diagnosis not present

## 2020-03-02 DIAGNOSIS — E782 Mixed hyperlipidemia: Secondary | ICD-10-CM | POA: Diagnosis not present

## 2020-03-03 DIAGNOSIS — F17210 Nicotine dependence, cigarettes, uncomplicated: Secondary | ICD-10-CM | POA: Diagnosis not present

## 2020-03-03 DIAGNOSIS — E782 Mixed hyperlipidemia: Secondary | ICD-10-CM | POA: Diagnosis not present

## 2020-03-03 DIAGNOSIS — M170 Bilateral primary osteoarthritis of knee: Secondary | ICD-10-CM | POA: Diagnosis not present

## 2020-03-03 DIAGNOSIS — I129 Hypertensive chronic kidney disease with stage 1 through stage 4 chronic kidney disease, or unspecified chronic kidney disease: Secondary | ICD-10-CM | POA: Diagnosis not present

## 2020-03-03 DIAGNOSIS — F0391 Unspecified dementia with behavioral disturbance: Secondary | ICD-10-CM | POA: Diagnosis not present

## 2020-03-03 DIAGNOSIS — N184 Chronic kidney disease, stage 4 (severe): Secondary | ICD-10-CM | POA: Diagnosis not present

## 2020-03-03 DIAGNOSIS — Z79899 Other long term (current) drug therapy: Secondary | ICD-10-CM | POA: Diagnosis not present

## 2020-03-03 DIAGNOSIS — B20 Human immunodeficiency virus [HIV] disease: Secondary | ICD-10-CM | POA: Diagnosis not present

## 2020-03-03 DIAGNOSIS — R1310 Dysphagia, unspecified: Secondary | ICD-10-CM | POA: Diagnosis not present

## 2020-03-03 MED FILL — AMLODIPINE BESYLATE 10 MG T: 10 | 30 days supply | Qty: 30 | Fill #6

## 2020-03-03 MED FILL — DONEPEZIL HCL 10 MG TABLET: 10 | 30 days supply | Qty: 30 | Fill #8

## 2020-03-03 MED FILL — HYDRALAZINE HCL 25 MG TABS: 25 | 30 days supply | Qty: 60 | Fill #4

## 2020-03-03 MED FILL — OLANZapine 5 MG TABS: 5 | 30 days supply | Qty: 30 | Fill #1

## 2020-03-03 MED FILL — BIKTARVY 50-200-25 MG TABS: 50-200-25 | 30 days supply | Qty: 30 | Fill #5

## 2020-03-09 DIAGNOSIS — M170 Bilateral primary osteoarthritis of knee: Secondary | ICD-10-CM | POA: Diagnosis not present

## 2020-03-09 DIAGNOSIS — R1310 Dysphagia, unspecified: Secondary | ICD-10-CM | POA: Diagnosis not present

## 2020-03-09 DIAGNOSIS — N184 Chronic kidney disease, stage 4 (severe): Secondary | ICD-10-CM | POA: Diagnosis not present

## 2020-03-09 DIAGNOSIS — B20 Human immunodeficiency virus [HIV] disease: Secondary | ICD-10-CM | POA: Diagnosis not present

## 2020-03-09 DIAGNOSIS — I129 Hypertensive chronic kidney disease with stage 1 through stage 4 chronic kidney disease, or unspecified chronic kidney disease: Secondary | ICD-10-CM | POA: Diagnosis not present

## 2020-03-09 DIAGNOSIS — Z79899 Other long term (current) drug therapy: Secondary | ICD-10-CM | POA: Diagnosis not present

## 2020-03-09 DIAGNOSIS — R32 Unspecified urinary incontinence: Secondary | ICD-10-CM | POA: Diagnosis not present

## 2020-03-09 DIAGNOSIS — E782 Mixed hyperlipidemia: Secondary | ICD-10-CM | POA: Diagnosis not present

## 2020-03-09 DIAGNOSIS — F17210 Nicotine dependence, cigarettes, uncomplicated: Secondary | ICD-10-CM | POA: Diagnosis not present

## 2020-03-09 DIAGNOSIS — F0391 Unspecified dementia with behavioral disturbance: Secondary | ICD-10-CM | POA: Diagnosis not present

## 2020-03-12 ENCOUNTER — Other Ambulatory Visit: Payer: Self-pay

## 2020-03-12 ENCOUNTER — Other Ambulatory Visit: Payer: Medicare HMO

## 2020-03-12 DIAGNOSIS — Z113 Encounter for screening for infections with a predominantly sexual mode of transmission: Secondary | ICD-10-CM

## 2020-03-12 DIAGNOSIS — B20 Human immunodeficiency virus [HIV] disease: Secondary | ICD-10-CM | POA: Diagnosis not present

## 2020-03-13 DIAGNOSIS — B20 Human immunodeficiency virus [HIV] disease: Secondary | ICD-10-CM | POA: Diagnosis not present

## 2020-03-13 DIAGNOSIS — Z79899 Other long term (current) drug therapy: Secondary | ICD-10-CM | POA: Diagnosis not present

## 2020-03-13 DIAGNOSIS — E782 Mixed hyperlipidemia: Secondary | ICD-10-CM | POA: Diagnosis not present

## 2020-03-13 DIAGNOSIS — N184 Chronic kidney disease, stage 4 (severe): Secondary | ICD-10-CM | POA: Diagnosis not present

## 2020-03-13 DIAGNOSIS — F17210 Nicotine dependence, cigarettes, uncomplicated: Secondary | ICD-10-CM | POA: Diagnosis not present

## 2020-03-13 DIAGNOSIS — I129 Hypertensive chronic kidney disease with stage 1 through stage 4 chronic kidney disease, or unspecified chronic kidney disease: Secondary | ICD-10-CM | POA: Diagnosis not present

## 2020-03-13 DIAGNOSIS — M170 Bilateral primary osteoarthritis of knee: Secondary | ICD-10-CM | POA: Diagnosis not present

## 2020-03-13 DIAGNOSIS — F0391 Unspecified dementia with behavioral disturbance: Secondary | ICD-10-CM | POA: Diagnosis not present

## 2020-03-13 DIAGNOSIS — R1310 Dysphagia, unspecified: Secondary | ICD-10-CM | POA: Diagnosis not present

## 2020-03-13 LAB — T-HELPER CELL (CD4) - (RCID CLINIC ONLY)
CD4 % Helper T Cell: 31 % — ABNORMAL LOW (ref 33–65)
CD4 T Cell Abs: 338 /uL — ABNORMAL LOW (ref 400–1790)

## 2020-03-16 LAB — COMPREHENSIVE METABOLIC PANEL
AG Ratio: 1.6 (calc) (ref 1.0–2.5)
ALT: 11 U/L (ref 9–46)
AST: 14 U/L (ref 10–35)
Albumin: 4.4 g/dL (ref 3.6–5.1)
Alkaline phosphatase (APISO): 67 U/L (ref 35–144)
BUN/Creatinine Ratio: 12 (calc) (ref 6–22)
BUN: 34 mg/dL — ABNORMAL HIGH (ref 7–25)
CO2: 26 mmol/L (ref 20–32)
Calcium: 9.4 mg/dL (ref 8.6–10.3)
Chloride: 110 mmol/L (ref 98–110)
Creat: 2.8 mg/dL — ABNORMAL HIGH (ref 0.70–1.25)
Globulin: 2.8 g/dL (calc) (ref 1.9–3.7)
Glucose, Bld: 59 mg/dL — ABNORMAL LOW (ref 65–99)
Potassium: 4.1 mmol/L (ref 3.5–5.3)
Sodium: 145 mmol/L (ref 135–146)
Total Bilirubin: 0.4 mg/dL (ref 0.2–1.2)
Total Protein: 7.2 g/dL (ref 6.1–8.1)

## 2020-03-16 LAB — RPR: RPR Ser Ql: NONREACTIVE

## 2020-03-16 LAB — HIV-1 RNA QUANT-NO REFLEX-BLD
HIV 1 RNA Quant: <20 {copies}/mL — ABNORMAL HIGH
HIV-1 RNA Quant, Log: <1.3 {Log_copies}/mL — ABNORMAL HIGH

## 2020-03-18 MED FILL — DIVALPROEX SODIUM 125 MG CA: 125 | 15 days supply | Qty: 60 | Fill #2

## 2020-03-19 DIAGNOSIS — E782 Mixed hyperlipidemia: Secondary | ICD-10-CM | POA: Diagnosis not present

## 2020-03-19 DIAGNOSIS — I129 Hypertensive chronic kidney disease with stage 1 through stage 4 chronic kidney disease, or unspecified chronic kidney disease: Secondary | ICD-10-CM | POA: Diagnosis not present

## 2020-03-19 DIAGNOSIS — F0391 Unspecified dementia with behavioral disturbance: Secondary | ICD-10-CM | POA: Diagnosis not present

## 2020-03-19 DIAGNOSIS — R1310 Dysphagia, unspecified: Secondary | ICD-10-CM | POA: Diagnosis not present

## 2020-03-19 DIAGNOSIS — M170 Bilateral primary osteoarthritis of knee: Secondary | ICD-10-CM | POA: Diagnosis not present

## 2020-03-19 DIAGNOSIS — N184 Chronic kidney disease, stage 4 (severe): Secondary | ICD-10-CM | POA: Diagnosis not present

## 2020-03-19 DIAGNOSIS — F17210 Nicotine dependence, cigarettes, uncomplicated: Secondary | ICD-10-CM | POA: Diagnosis not present

## 2020-03-19 DIAGNOSIS — B20 Human immunodeficiency virus [HIV] disease: Secondary | ICD-10-CM | POA: Diagnosis not present

## 2020-03-19 DIAGNOSIS — Z79899 Other long term (current) drug therapy: Secondary | ICD-10-CM | POA: Diagnosis not present

## 2020-03-26 ENCOUNTER — Encounter: Payer: Medicare HMO | Admitting: Family

## 2020-03-30 MED FILL — BIKTARVY 50-200-25 MG TABS: 50-200-25 | 30 days supply | Qty: 30 | Fill #6

## 2020-04-08 ENCOUNTER — Other Ambulatory Visit (HOSPITAL_COMMUNITY): Payer: Self-pay | Admitting: Family Medicine

## 2020-04-08 ENCOUNTER — Other Ambulatory Visit: Payer: Self-pay | Admitting: Family Medicine

## 2020-04-08 ENCOUNTER — Other Ambulatory Visit: Payer: Self-pay | Admitting: Physician Assistant

## 2020-04-08 DIAGNOSIS — R32 Unspecified urinary incontinence: Secondary | ICD-10-CM | POA: Diagnosis not present

## 2020-04-08 MED FILL — ATORVASTATIN CALCIUM 20 MG: 20 | 90 days supply | Qty: 90 | Fill #0

## 2020-04-08 NOTE — Telephone Encounter (Signed)
Requested medication (s) are due for refill today: no  Requested medication (s) are on the active medication list: yes  Last refill: 03/18/2020  Future visit scheduled: yes  Notes to clinic:  this refill cannot be delegated    Requested Prescriptions  Pending Prescriptions Disp Refills   divalproex (DEPAKOTE SPRINKLE) 125 MG capsule [Pharmacy Med Name: DIVALPROEX SODIUM 125 MG CA 125 Capsule Delayed Release Sprinkle] 60 capsule 2    Sig: TAKE 2 CAPSULES (250 MG TOTAL) BY MOUTH EVERY 12 (TWELVE) HOURS.      Not Delegated - Neurology:  Anticonvulsants - Valproates Failed - 04/08/2020  2:08 PM      Failed - This refill cannot be delegated      Failed - HGB in normal range and within 360 days    Hemoglobin  Date Value Ref Range Status  09/26/2019 13.1 (L) 13.2 - 17.1 g/dL Final          Failed - Valproic Acid (serum) in normal range and within 360 days    No results found for: VALPROATE, VPAT        Passed - AST in normal range and within 360 days    AST  Date Value Ref Range Status  03/12/2020 14 10 - 35 U/L Final          Passed - ALT in normal range and within 360 days    ALT  Date Value Ref Range Status  03/12/2020 11 9 - 46 U/L Final          Passed - PLT in normal range and within 360 days    Platelets  Date Value Ref Range Status  09/26/2019 158 140 - 400 Thousand/uL Final          Passed - WBC in normal range and within 360 days    WBC  Date Value Ref Range Status  09/26/2019 6.1 3.8 - 10.8 Thousand/uL Final          Passed - HCT in normal range and within 360 days    HCT  Date Value Ref Range Status  09/26/2019 39.1 38 - 50 % Final          Passed - Valid encounter within last 12 months    Recent Outpatient Visits           2 months ago Essential hypertension   [place], Dionne Bucy, MD   3 months ago Pre-operative clearance   [place], [place], [place]   5 months ago Stage 3b chronic  kidney disease   [facility], Dionne Bucy, MD   10 months ago Dementia associated with other underlying disease with behavioral disturbance [facility])   [facility], Dionne Bucy, MD   1 year ago Essential hypertension   [place], Dionne Bucy, MD       Future Appointments             In 2 months Bacigalupo, Dionne Bucy, MD [facility], [place]

## 2020-04-09 ENCOUNTER — Other Ambulatory Visit (HOSPITAL_COMMUNITY): Payer: Self-pay | Admitting: Family Medicine

## 2020-04-09 ENCOUNTER — Ambulatory Visit: Payer: Self-pay | Admitting: *Deleted

## 2020-04-09 MED FILL — DIVALPROEX SODIUM 125 MG CA: 125 | 15 days supply | Qty: 60 | Fill #0

## 2020-04-09 NOTE — Chronic Care Management (AMB) (Signed)
Patient's CCM status changed to previously enrolled. ° ° ° ° °Gaje Tennyson, LCSW °Clinical Social Worker  °[facility] Care Management °[phone] ° °

## 2020-04-13 ENCOUNTER — Other Ambulatory Visit: Payer: Self-pay | Admitting: Family Medicine

## 2020-04-21 ENCOUNTER — Other Ambulatory Visit: Payer: Self-pay | Admitting: Physician Assistant

## 2020-04-22 ENCOUNTER — Encounter: Payer: Medicare HMO | Admitting: Family

## 2020-04-23 ENCOUNTER — Other Ambulatory Visit: Payer: Self-pay

## 2020-04-23 ENCOUNTER — Ambulatory Visit (INDEPENDENT_AMBULATORY_CARE_PROVIDER_SITE_OTHER): Payer: Medicare HMO | Admitting: Family

## 2020-04-23 ENCOUNTER — Other Ambulatory Visit (HOSPITAL_COMMUNITY): Payer: Self-pay | Admitting: Family Medicine

## 2020-04-23 ENCOUNTER — Encounter: Payer: Self-pay | Admitting: Family

## 2020-04-23 ENCOUNTER — Other Ambulatory Visit: Payer: Self-pay | Admitting: Family Medicine

## 2020-04-23 VITALS — BP 172/91 | HR 68 | Temp 97.5°F | Wt 137.0 lb

## 2020-04-23 DIAGNOSIS — N1832 Chronic kidney disease, stage 3b: Secondary | ICD-10-CM

## 2020-04-23 DIAGNOSIS — B20 Human immunodeficiency virus [HIV] disease: Secondary | ICD-10-CM

## 2020-04-23 DIAGNOSIS — F0390 Unspecified dementia without behavioral disturbance: Secondary | ICD-10-CM

## 2020-04-23 NOTE — Assessment & Plan Note (Signed)
Peter Cox creatinine has increased slightly.  His daughter states he has not been drinking very much water but does consume a significant amount of soda which is likely contributing to this.  Encourage water and hydration.  He does have follow-up with nephrology.

## 2020-04-23 NOTE — Assessment & Plan Note (Signed)
Peter Cox continues to have well-controlled HIV disease with good adherence and tolerance to his ART regimen of Biktarvy.  Unfortunately it appears his dementia has worsened which HIV is likely a contributing factor to this based on previous amount of time off of medications.  Continue current dose of Biktarvy.  Discussed with daughter the reduction of office visits and can be done via telehealth to reduce the risk of strain on both Mr. Adelson as well as herself.  Plan for follow-up in 6 months or sooner if needed.

## 2020-04-23 NOTE — Assessment & Plan Note (Signed)
Peter Cox has had worsening of his memory and dementia over the past several months.  Able to answer some basic questions today with memory noted to be fluctuating.  Remains in care with neurology. Best thing from ID standpoint would be continue Biktarvy. Other medication adjustments and needs per neurology.

## 2020-04-23 NOTE — Progress Notes (Signed)
Subjective:    Patient ID: Peter Cox, male    DOB: Feb 06, 1961, 59 y.o.   MRN: [id number]  Chief Complaint  Patient presents with  . Follow-up     HPI:  Peter Cox is a 59 y.o. male with HIV disease who was last seen in the office on 10/10/2019 with good adherence and tolerance to his ART regimen of Biktarvy.  Chronic kidney disease remain labile between stages 2 and 3.  Lab work at the time showed a viral load that was undetectable and CD4 count of 409.  Most recent blood work completed on 03/12/2020 with a viral load that remains undetectable and CD4 count of 338.  RPR nonreactive for syphilis.  Renal function slightly up from previous now at 2.84 creatinine compared to previous 2.43.  GFR stable from previous.  Here today for routine follow-up with his daughter.  Peter Cox daughter provides majority of the history given his worsening dementia.  He has been receiving his medications as prescribed with no adverse side effects.  Mental status has declined over the last several months.  She is concerned about his renal function as well as having difficulties with eating.  She is not aware of any fevers or chills.  No recreational or illicit drug use, tobacco use, or alcohol consumption.  No condoms necessary.  Unable to express any depression or feelings of depression.  He is tearful at times intermixed with laughing.   No Known Allergies    Outpatient Medications Prior to Visit  Medication Sig Dispense Refill  . acetaminophen (TYLENOL) 500 MG tablet Take 1 tablet (500 mg total) by mouth in the morning and at bedtime. 60 tablet 11  . amLODipine (NORVASC) 10 MG tablet Take 1 tablet (10 mg total) by mouth daily. 30 tablet 11  . atorvastatin (LIPITOR) 20 MG tablet TAKE 1 TABLET BY MOUTH ONCE DAILY 90 tablet 0  . bictegravir-emtricitabine-tenofovir AF (BIKTARVY) 50-200-25 MG TABS tablet Take 1 tablet by mouth daily. 30 tablet 11  . cyanocobalamin 100 MCG tablet Take by mouth.    .  divalproex (DEPAKOTE SPRINKLE) 125 MG capsule TAKE 2 CAPSULES (250 MG TOTAL) BY MOUTH EVERY 12 (TWELVE) HOURS. 60 capsule 2  . donepezil (ARICEPT) 10 MG tablet Take 1/2 tablet daily for 2 weeks, then increase to 1 tablet daily (Patient taking differently: Take 10 mg by mouth daily. ) 30 tablet 11  . hydrALAZINE (APRESOLINE) 25 MG tablet     . labetalol (NORMODYNE) 100 MG tablet TAKE 1 TABLET BY MOUTH TWICE A DAY 180 tablet 0  . lisinopril (ZESTRIL) 40 MG tablet Take 1 tablet (40 mg total) by mouth daily. 90 tablet 1  . OLANZapine (ZYPREXA) 5 MG tablet TAKE 1 TABLET (5 MG TOTAL) BY MOUTH DAILY AS NEEDED (AGITATION). 30 tablet 3  . phenylephrine-shark liver oil-mineral oil-petrolatum (PREPARATION H) 0.25-14-74.9 % rectal ointment Place 1 application rectally 2 (two) times daily as needed for hemorrhoids. 56 g 1  . Vitamins/Minerals TABS Take by mouth.     No facility-administered medications prior to visit.     Past Medical History:  Diagnosis Date  . Alcohol abuse    heavy, clean since April  . Alcohol abuse 11/24/2014  . Cocaine abuse ([place])     clean since about 2000  . Drug abuse ([place]) 11/24/2014  . HIV infection ([place])    dx 08/08/2008 (unknown how he was exposed)  Initial VL 43,200 and CD4 410 on 08/28/08  . Hyperlipidemia   .  Hypertension   . Marijuana abuse   . Mild cognitive impairment    likely vascular dementia/psa/?HIV componentCT Head 02/12/08 for htn crisis and concern for stroke: impression-1. Old right internal capsule and thalamic lacunar infarcts. 2. Mild to moderate chronic small vessell white matter ischemic changes in both cerebral hemisphere, greater on the right  . Neuromuscular disorder ([place])   . Seizures ([place])    "last year had seizure"  . Stroke ([place])   . Tobacco abuse      Past Surgical History:  Procedure Laterality Date  . CLOSED REDUCTION PATELLAR     right knee  . HEMORRHOID SURGERY    . SHOULDER ARTHROSCOPY  01/2008   w/extensive debridement (Dr  Mardelle Matte)       Review of Systems  Constitutional: Negative for appetite change, chills, fatigue, fever and unexpected weight change.  Eyes: Negative for visual disturbance.  Respiratory: Negative for cough, chest tightness, shortness of breath and wheezing.   Cardiovascular: Negative for chest pain and leg swelling.  Gastrointestinal: Negative for abdominal pain, constipation, diarrhea, nausea and vomiting.  Genitourinary: Negative for dysuria, flank pain, frequency, genital sores, hematuria and urgency.  Skin: Negative for rash.  Allergic/Immunologic: Negative for immunocompromised state.  Neurological: Negative for dizziness and headaches.      Objective:    BP (!) 172/91   Pulse 68   Temp (!) 97.5 F (36.4 C) (Oral)   Wt 137 lb (62.1 kg)   SpO2 100%   BMI 23.52 kg/m  Nursing note and vital signs reviewed.  Physical Exam Constitutional:      General: He is not in acute distress.    Appearance: He is well-developed.  Eyes:     Conjunctiva/sclera: Conjunctivae normal.  Cardiovascular:     Rate and Rhythm: Normal rate and regular rhythm.     Heart sounds: Normal heart sounds. No murmur heard.  No friction rub. No gallop.   Pulmonary:     Effort: Pulmonary effort is normal. No respiratory distress.     Breath sounds: Normal breath sounds. No wheezing or rales.  Chest:     Chest wall: No tenderness.  Abdominal:     General: Bowel sounds are normal.     Palpations: Abdomen is soft.     Tenderness: There is no abdominal tenderness.  Musculoskeletal:     Cervical back: Neck supple.  Lymphadenopathy:     Cervical: No cervical adenopathy.  Skin:    General: Skin is warm and dry.     Findings: No rash.  Neurological:     Mental Status: He is alert. He is disoriented.      Depression screen [facility] 2/9 04/23/2020 09/17/2018 04/17/2018 03/19/2018 12/04/2014  Decreased Interest 0 0 0 0 1  Down, Depressed, Hopeless 0 2 - 0 1  PHQ - 2 Score 0 2 0 0 2  Altered sleeping - 0 -  - 1  Tired, decreased energy - 0 - - 0  Change in appetite - 3 - - 1  Feeling bad or failure about yourself  - 0 - - 0  Trouble concentrating - 1 - - 0  Moving slowly or fidgety/restless - 3 - - -  Suicidal thoughts - 0 - - 0  PHQ-9 Score - 9 - - 4  Difficult doing work/chores - Not difficult at all - - Somewhat difficult  Some recent data might be hidden       Assessment & Plan:    Patient Active Problem List  Diagnosis Date Noted  . Dysphagia 02/06/2020  . Physical deconditioning 02/06/2020  . History of substance abuse ([place]) 09/17/2018  . Urinary incontinence 09/17/2018  . Bilateral primary osteoarthritis of knee 09/13/2018  . Decreased activities of daily living (ADL) 09/13/2018  . Alcohol abuse 11/24/2014  . Dementia without behavioral disturbance ([place]) 07/02/2014  . History of CVA (cerebrovascular accident) 07/02/2014  . CKD (chronic kidney disease), stage III ([place]) 11/20/2008  . Human immunodeficiency virus (HIV) disease ([place]) 08/17/2008  . HLD (hyperlipidemia) 06/26/2008  . Essential hypertension 06/24/2008     Problem List Items Addressed This Visit      Nervous and Auditory   Dementia without behavioral disturbance [facility])    Peter Cox has had worsening of his memory and dementia over the past several months.  Able to answer some basic questions today with memory noted to be fluctuating.  Remains in care with neurology. Best thing from ID standpoint would be continue Biktarvy. Other medication adjustments and needs per neurology.         Genitourinary   CKD (chronic kidney disease), stage III [facility])    Peter Cox creatinine has increased slightly.  His daughter states he has not been drinking very much water but does consume a significant amount of soda which is likely contributing to this.  Encourage water and hydration.  He does have follow-up with nephrology.        Other   Human immunodeficiency virus (HIV) disease ([place]) - Primary (Chronic)    Peter Cox  continues to have well-controlled HIV disease with good adherence and tolerance to his ART regimen of Biktarvy.  Unfortunately it appears his dementia has worsened which HIV is likely a contributing factor to this based on previous amount of time off of medications.  Continue current dose of Biktarvy.  Discussed with daughter the reduction of office visits and can be done via telehealth to reduce the risk of strain on both Peter Cox as well as herself.  Plan for follow-up in 6 months or sooner if needed.          I am having Peter Cox. Peter Cox maintain his donepezil, amLODipine, Biktarvy, Vitamins/Minerals, Preparation H, acetaminophen, OLANZapine, hydrALAZINE, lisinopril, atorvastatin, divalproex, labetalol, and cyanocobalamin.   Follow-up: Return in about 6 months (around 10/21/2020), or if symptoms worsen or fail to improve.   Terri Piedra, MSN, FNP-C Nurse Practitioner [facility] for Infectious Disease [place] number: [phone]

## 2020-04-23 NOTE — Patient Instructions (Signed)
Nice to see you.  Continue take your [place] daily as prescribed.  Plan for follow-up in 6 months or sooner if needed through virtual visit.  Have a great day and stay safe!

## 2020-04-28 MED FILL — BIKTARVY 50-200-25 MG TABS: 50-200-25 | 30 days supply | Qty: 30 | Fill #7

## 2020-05-04 ENCOUNTER — Encounter: Payer: Self-pay | Admitting: Urology

## 2020-05-04 ENCOUNTER — Other Ambulatory Visit: Payer: Self-pay | Admitting: Family Medicine

## 2020-05-04 ENCOUNTER — Ambulatory Visit
Admission: RE | Admit: 2020-05-04 | Discharge: 2020-05-04 | Disposition: A | Discharge status: Home / Self Care | Payer: Medicare HMO | Source: Ambulatory Visit | Attending: Nephrology | Admitting: Nephrology

## 2020-05-04 ENCOUNTER — Other Ambulatory Visit: Payer: Self-pay

## 2020-05-04 ENCOUNTER — Ambulatory Visit (INDEPENDENT_AMBULATORY_CARE_PROVIDER_SITE_OTHER): Payer: Medicare HMO | Admitting: Urology

## 2020-05-04 VITALS — BP 126/87 | HR 67 | Ht 69.0 in | Wt 134.0 lb

## 2020-05-04 DIAGNOSIS — N281 Cyst of kidney, acquired: Secondary | ICD-10-CM | POA: Diagnosis not present

## 2020-05-04 DIAGNOSIS — I129 Hypertensive chronic kidney disease with stage 1 through stage 4 chronic kidney disease, or unspecified chronic kidney disease: Secondary | ICD-10-CM | POA: Diagnosis not present

## 2020-05-04 DIAGNOSIS — N184 Chronic kidney disease, stage 4 (severe): Secondary | ICD-10-CM | POA: Diagnosis not present

## 2020-05-04 DIAGNOSIS — N2581 Secondary hyperparathyroidism of renal origin: Secondary | ICD-10-CM

## 2020-05-04 DIAGNOSIS — N2889 Other specified disorders of kidney and ureter: Secondary | ICD-10-CM

## 2020-05-04 DIAGNOSIS — R188 Other ascites: Secondary | ICD-10-CM | POA: Diagnosis not present

## 2020-05-04 DIAGNOSIS — I700 Atherosclerosis of aorta: Secondary | ICD-10-CM | POA: Diagnosis not present

## 2020-05-04 DIAGNOSIS — R809 Proteinuria, unspecified: Secondary | ICD-10-CM | POA: Diagnosis not present

## 2020-05-04 MED FILL — DONEPEZIL HCL 10 MG TABLET: 10 | 30 days supply | Qty: 30 | Fill #9

## 2020-05-04 MED FILL — AMLODIPINE BESYLATE 10 MG T: 10 | 30 days supply | Qty: 30 | Fill #7

## 2020-05-04 MED FILL — DIVALPROEX SODIUM 125 MG CA: 125 | 15 days supply | Qty: 60 | Fill #1

## 2020-05-04 NOTE — Progress Notes (Signed)
05/04/2020 10:37 AM   Peter Cox June 02, 1961 [id number]  Referring provider: Virginia Cox, Peter Cox,  Peter Cox [id number]  Chief Complaint  Patient presents with  . Other    renal mass    HPI: Peter Cox is a 59 y.o. male with a history of stage IV CKD, HIV and dementia seen at the request of Dr. Juleen Cox for ED evaluation of a right renal mass.  He did not provide any history and presents today with his daughter.   RUS performed 11/28/2019 remarkable for a 13 mm solid-appearing echogenic mass upper pole right kidney  CT abdomen pelvis without contrast ordered and performed today however study suboptimal secondary to motion degradation and lack of IV contrast.  12 mm lesion anterior right upper kidney which was indeterminant  Radiology recommended MRI and if GFR >30 would be a candidate for half contrast however his GFR is < 30   No apparent lower urinary tract symptoms or gross hematuria   PMH: Past Medical History:  Diagnosis Date  . Alcohol abuse    heavy, clean since April  . Alcohol abuse 11/24/2014  . Cocaine abuse ([place])     clean since about 2000  . Drug abuse ([place]) 11/24/2014  . HIV infection ([place])    dx 08/08/2008 (unknown how he was exposed)  Initial VL 43,200 and CD4 410 on 08/28/08  . Hyperlipidemia   . Hypertension   . Marijuana abuse   . Mild cognitive impairment    likely vascular dementia/psa/?HIV componentCT Head 02/12/08 for htn crisis and concern for stroke: impression-1. Old right internal capsule and thalamic lacunar infarcts. 2. Mild to moderate chronic small vessell white matter ischemic changes in both cerebral hemisphere, greater on the right  . Neuromuscular disorder ([place])   . Seizures ([place])    "last year had seizure"  . Stroke ([place])   . Tobacco abuse     Surgical History: Past Surgical History:  Procedure Laterality Date  . CLOSED REDUCTION PATELLAR     right knee  . HEMORRHOID SURGERY    . SHOULDER  ARTHROSCOPY  01/2008   w/extensive debridement (Dr Mardelle Matte)    Home Medications:  Allergies as of 05/04/2020   No Known Allergies     Medication List       Accurate as of May 04, 2020 10:37 AM. If you have any questions, ask your nurse or doctor.        acetaminophen 500 MG tablet Commonly known as: TYLENOL Take 1 tablet (500 mg total) by mouth in the morning and at bedtime.   amLODipine 10 MG tablet Commonly known as: NORVASC Take 1 tablet (10 mg total) by mouth daily.   atorvastatin 20 MG tablet Commonly known as: LIPITOR TAKE 1 TABLET BY MOUTH ONCE DAILY   Biktarvy 50-200-25 MG Tabs tablet Generic drug: bictegravir-emtricitabine-tenofovir AF Take 1 tablet by mouth daily.   cyanocobalamin 100 MCG tablet Take by mouth.   divalproex 125 MG capsule Commonly known as: DEPAKOTE SPRINKLE TAKE 2 CAPSULES (250 MG TOTAL) BY MOUTH EVERY 12 (TWELVE) HOURS.   donepezil 10 MG tablet Commonly known as: ARICEPT Take 1/2 tablet daily for 2 weeks, then increase to 1 tablet daily What changed:   how much to take  how to take this  when to take this  additional instructions   hydrALAZINE 25 MG tablet Commonly known as: APRESOLINE   labetalol 100 MG tablet Commonly known as: NORMODYNE TAKE 1 TABLET BY MOUTH  TWICE A DAY   lisinopril 40 MG tablet Commonly known as: ZESTRIL Take 1 tablet (40 mg total) by mouth daily.   OLANZapine 5 MG tablet Commonly known as: ZYPREXA TAKE 1 TABLET (5 MG TOTAL) BY MOUTH DAILY AS NEEDED (AGITATION).   Preparation H 0.25-14-74.9 % rectal ointment Generic drug: phenylephrine-shark liver oil-mineral oil-petrolatum Place 1 application rectally 2 (two) times daily as needed for hemorrhoids.   Vitamins/Minerals Tabs Take by mouth.       Allergies: No Known Allergies  Family History: Family History  Problem Relation Age of Onset  . Diabetes Mother   . Hypertension Mother   . Stroke Mother   . Diabetes Father   .  Hypertension Sister   . Hypertension Sister   . Colon cancer Neg Hx     Social History:  reports that he has been smoking cigarettes. He has a 20.00 pack-year smoking history. He has never used smokeless tobacco. He reports previous alcohol use. He reports previous drug use. Frequency: 7.00 times per week. Drug: Marijuana.   Physical Exam: BP 126/87   Pulse 67   Ht 5\' 9"  (1.753 m)   Wt 134 lb (60.8 kg)   BMI 19.79 kg/m   Constitutional: Lethargic, No acute distress. HEENT: [place] AT, moist mucus membranes.  Trachea midline, no masses. Cardiovascular: No clubbing, cyanosis, or edema. Respiratory: Normal respiratory effort, no increased work of breathing.   Assessment & Plan:    1.  Right renal mass  13 mm indeterminate right renal mass  Discussed with daughter the possibility of a small renal cell carcinoma if this lesion is solid  She states that even if there was a diagnosis of renal cell carcinoma they would not desire any treatment based on his comorbidities  We discussed that surveillance of renal masses <4 cm is an acceptable option and in the case of Peter Cox would be ideal  She is in agreement and will schedule a follow-up renal ultrasound in 6 months to assess for interval growth.  All questions were answered   Peter Sons, MD  [facility] [address] [place], [place] [address] [phone]

## 2020-05-05 ENCOUNTER — Encounter: Payer: Self-pay | Admitting: Urology

## 2020-05-06 DIAGNOSIS — R32 Unspecified urinary incontinence: Secondary | ICD-10-CM | POA: Diagnosis not present

## 2020-05-18 MED FILL — HYDRALAZINE HCL 25 MG TABS: 25 | 30 days supply | Qty: 60 | Fill #5

## 2020-05-18 MED FILL — OLANZapine 5 MG TABS: 5 | 30 days supply | Qty: 30 | Fill #2

## 2020-05-25 DIAGNOSIS — N184 Chronic kidney disease, stage 4 (severe): Secondary | ICD-10-CM | POA: Diagnosis not present

## 2020-05-25 DIAGNOSIS — N2889 Other specified disorders of kidney and ureter: Secondary | ICD-10-CM | POA: Diagnosis not present

## 2020-05-25 DIAGNOSIS — I129 Hypertensive chronic kidney disease with stage 1 through stage 4 chronic kidney disease, or unspecified chronic kidney disease: Secondary | ICD-10-CM | POA: Diagnosis not present

## 2020-05-25 DIAGNOSIS — N2581 Secondary hyperparathyroidism of renal origin: Secondary | ICD-10-CM | POA: Diagnosis not present

## 2020-05-25 DIAGNOSIS — R809 Proteinuria, unspecified: Secondary | ICD-10-CM | POA: Diagnosis not present

## 2020-05-25 MED FILL — BIKTARVY 50-200-25 MG TABS: 50-200-25 | 30 days supply | Qty: 30 | Fill #8

## 2020-06-05 DIAGNOSIS — R32 Unspecified urinary incontinence: Secondary | ICD-10-CM | POA: Diagnosis not present

## 2020-06-05 NOTE — Progress Notes (Deleted)
Established patient visit   Patient: Peter Cox   DOB: June 26, 1960   59 y.o. Male  MRN: [id number] Visit Date: 06/08/2020  Today's healthcare provider: Lavon Paganini, MD   No chief complaint on file.  Subjective    HPI  Hypertension, follow-up  BP Readings from Last 3 Encounters:  05/04/20 126/87  04/23/20 (!) 172/91  02/06/20 (!) 148/82   Wt Readings from Last 3 Encounters:  05/04/20 134 lb (60.8 kg)  04/23/20 137 lb (62.1 kg)  02/06/20 138 lb (62.6 kg)     He was last seen for hypertension 4 months ago.  BP at that visit was 148/82. Management since that visit includes increase Lisinopril to 40mg .  He reports {excellent/good/fair/poor:19665} compliance with treatment. He {is/is not:9024} having side effects. {document side effects if present:1} He is following a {diet:21022986} diet. He {is/is not:9024} exercising. He {does/does not:200015} smoke.  Use of agents associated with hypertension: {bp agents assoc with hypertension:511::"none"}.   Outside blood pressures are {***enter patient reported home BP readings, or 'not being checked':1}. Symptoms: {Yes/No:20286} chest pain {Yes/No:20286} chest pressure  {Yes/No:20286} palpitations {Yes/No:20286} syncope  {Yes/No:20286} dyspnea {Yes/No:20286} orthopnea  {Yes/No:20286} paroxysmal nocturnal dyspnea {Yes/No:20286} lower extremity edema   Pertinent labs: Lab Results  Component Value Date   CHOL 129 10/15/2019   HDL 52 10/15/2019   LDLCALC 60 10/15/2019   TRIG 91 10/15/2019   CHOLHDL 2.5 10/15/2019   Lab Results  Component Value Date   NA 145 03/12/2020   K 4.1 03/12/2020   CREATININE 2.80 (H) 03/12/2020   GFRNONAA 28 (L) 02/06/2020   GFRAA 32 (L) 02/06/2020   GLUCOSE 59 (L) 03/12/2020     The ASCVD Risk score (Goff DC Jr., et al., 2013) failed to calculate for the following reasons:   The patient has a prior MI or stroke diagnosis    ---------------------------------------------------------------------------------------------------   {Show patient history (optional):23778::" "}   Medications: Outpatient Medications Prior to Visit  Medication Sig  . acetaminophen (TYLENOL) 500 MG tablet Take 1 tablet (500 mg total) by mouth in the morning and at bedtime.  [place] amLODipine (NORVASC) 10 MG tablet Take 1 tablet (10 mg total) by mouth daily.  [place] atorvastatin (LIPITOR) 20 MG tablet TAKE 1 TABLET BY MOUTH ONCE DAILY  . bictegravir-emtricitabine-tenofovir AF (BIKTARVY) 50-200-25 MG TABS tablet Take 1 tablet by mouth daily.  . cyanocobalamin 100 MCG tablet Take by mouth.  . divalproex (DEPAKOTE SPRINKLE) 125 MG capsule TAKE 2 CAPSULES (250 MG TOTAL) BY MOUTH EVERY 12 (TWELVE) HOURS.  [place] Kitchen donepezil (ARICEPT) 10 MG tablet Take 1/2 tablet daily for 2 weeks, then increase to 1 tablet daily (Patient taking differently: Take 10 mg by mouth daily. )  . hydrALAZINE (APRESOLINE) 25 MG tablet   . labetalol (NORMODYNE) 100 MG tablet TAKE 1 TABLET BY MOUTH TWICE A DAY  . lisinopril (ZESTRIL) 40 MG tablet Take 1 tablet (40 mg total) by mouth daily.  [place] Kitchen OLANZapine (ZYPREXA) 5 MG tablet TAKE 1 TABLET (5 MG TOTAL) BY MOUTH DAILY AS NEEDED (AGITATION).  [place] Kitchen phenylephrine-shark liver oil-mineral oil-petrolatum (PREPARATION H) 0.25-14-74.9 % rectal ointment Place 1 application rectally 2 (two) times daily as needed for hemorrhoids.  . Vitamins/Minerals TABS Take by mouth.   No facility-administered medications prior to visit.    Review of Systems  {Heme  Chem  Endocrine  Serology  Results Review (optional):23779::" "}  Objective    There were no vitals taken for this visit. {Show previous vital signs (optional):23777::" "}  Physical Exam  ***  No results found for any visits on 06/08/20.  Assessment & Plan     ***  No follow-ups on file.      {provider attestation***:1}   Lavon Paganini, MD  [facility] [phone] (phone) [phone] (fax)  [place]

## 2020-06-08 ENCOUNTER — Ambulatory Visit: Payer: Medicare HMO | Admitting: Family Medicine

## 2020-06-09 IMAGING — CR DG RIBS W/ CHEST 3+V*L*
1 series · 3 of 3 positions shown · non-contrast
Comparison: Chest radiographs 06/21/2019 and 05/10/2019.

CLINICAL DATA: Fall to RIGHT side. Posterior LEFT rib and left knee
pain.

EXAM:
LEFT RIBS AND CHEST - 3+ VIEW

[Series 1: dg ribs unilateral w/chest left · 0.14mm/px · 3 of 3 slices shown]
[im 1/3]
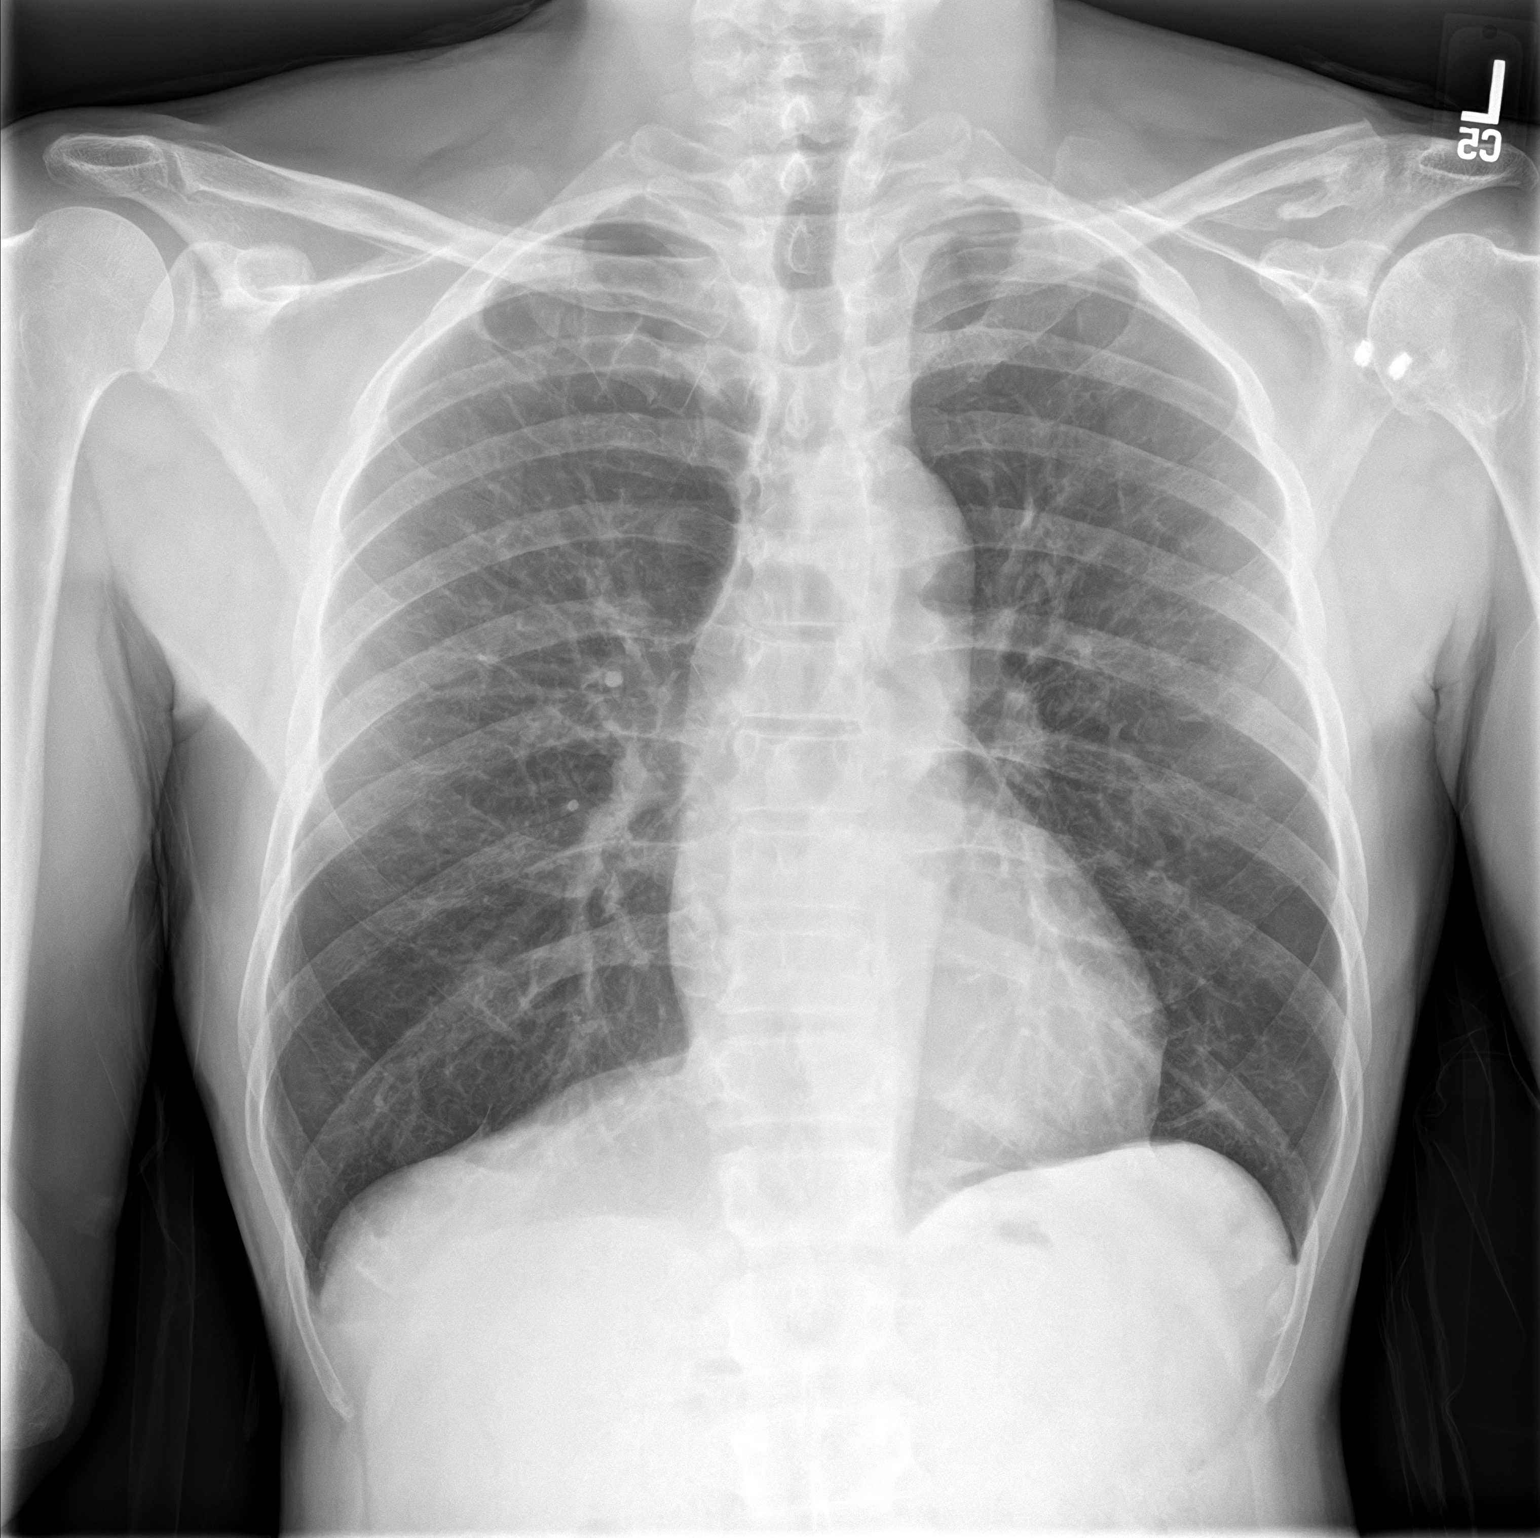
[im 2/3]
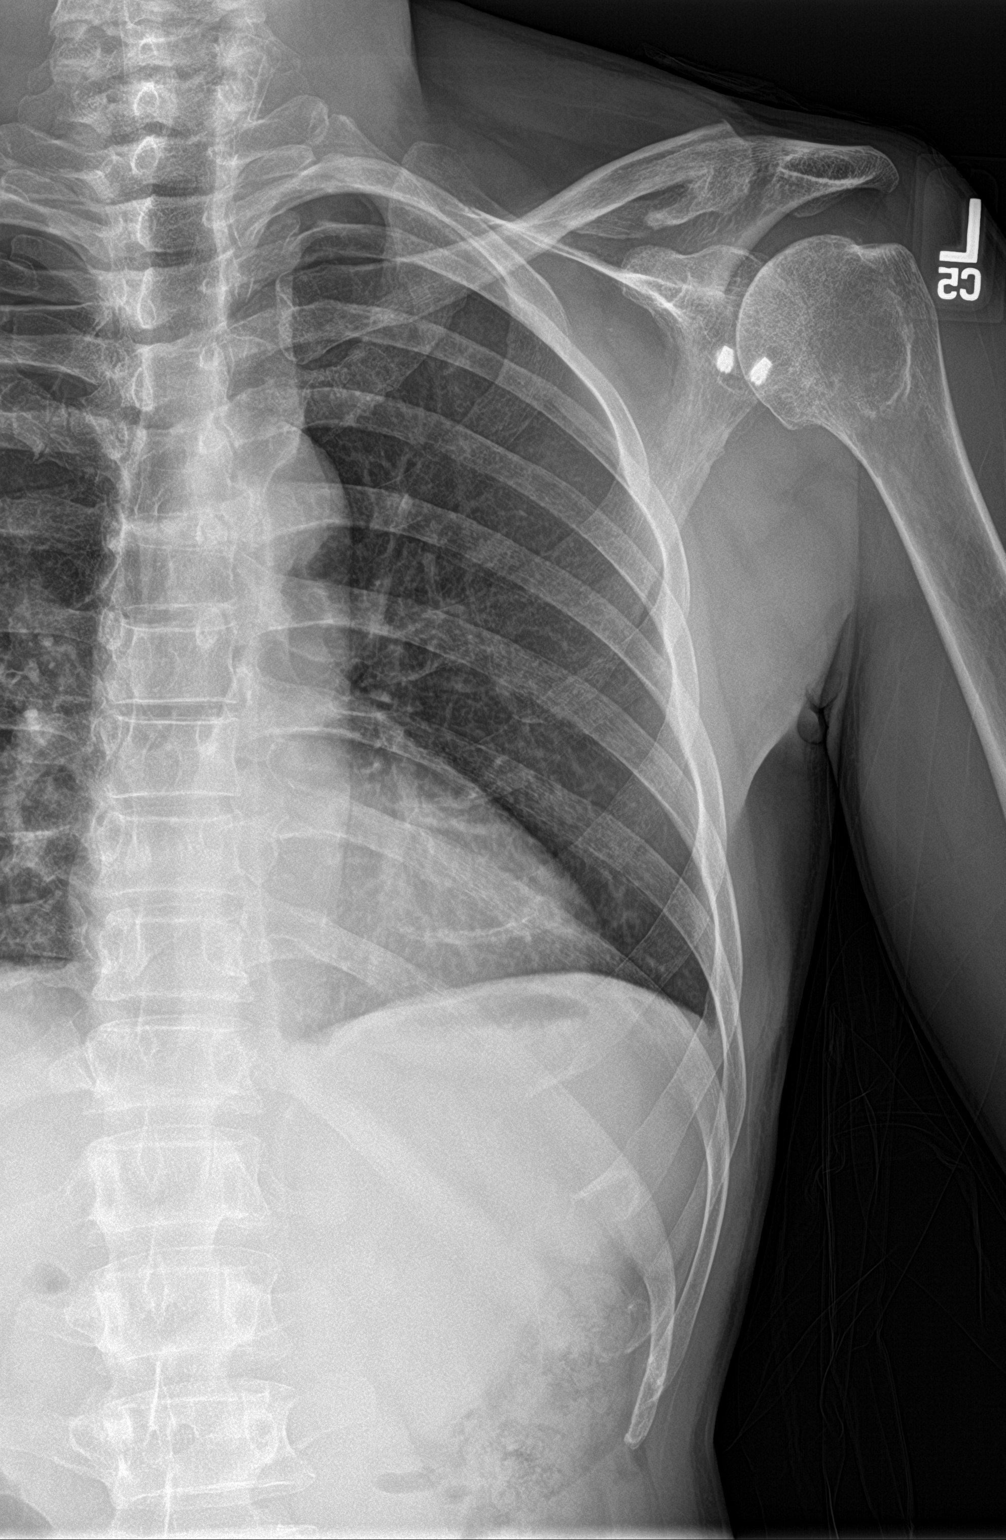
[im 3/3]
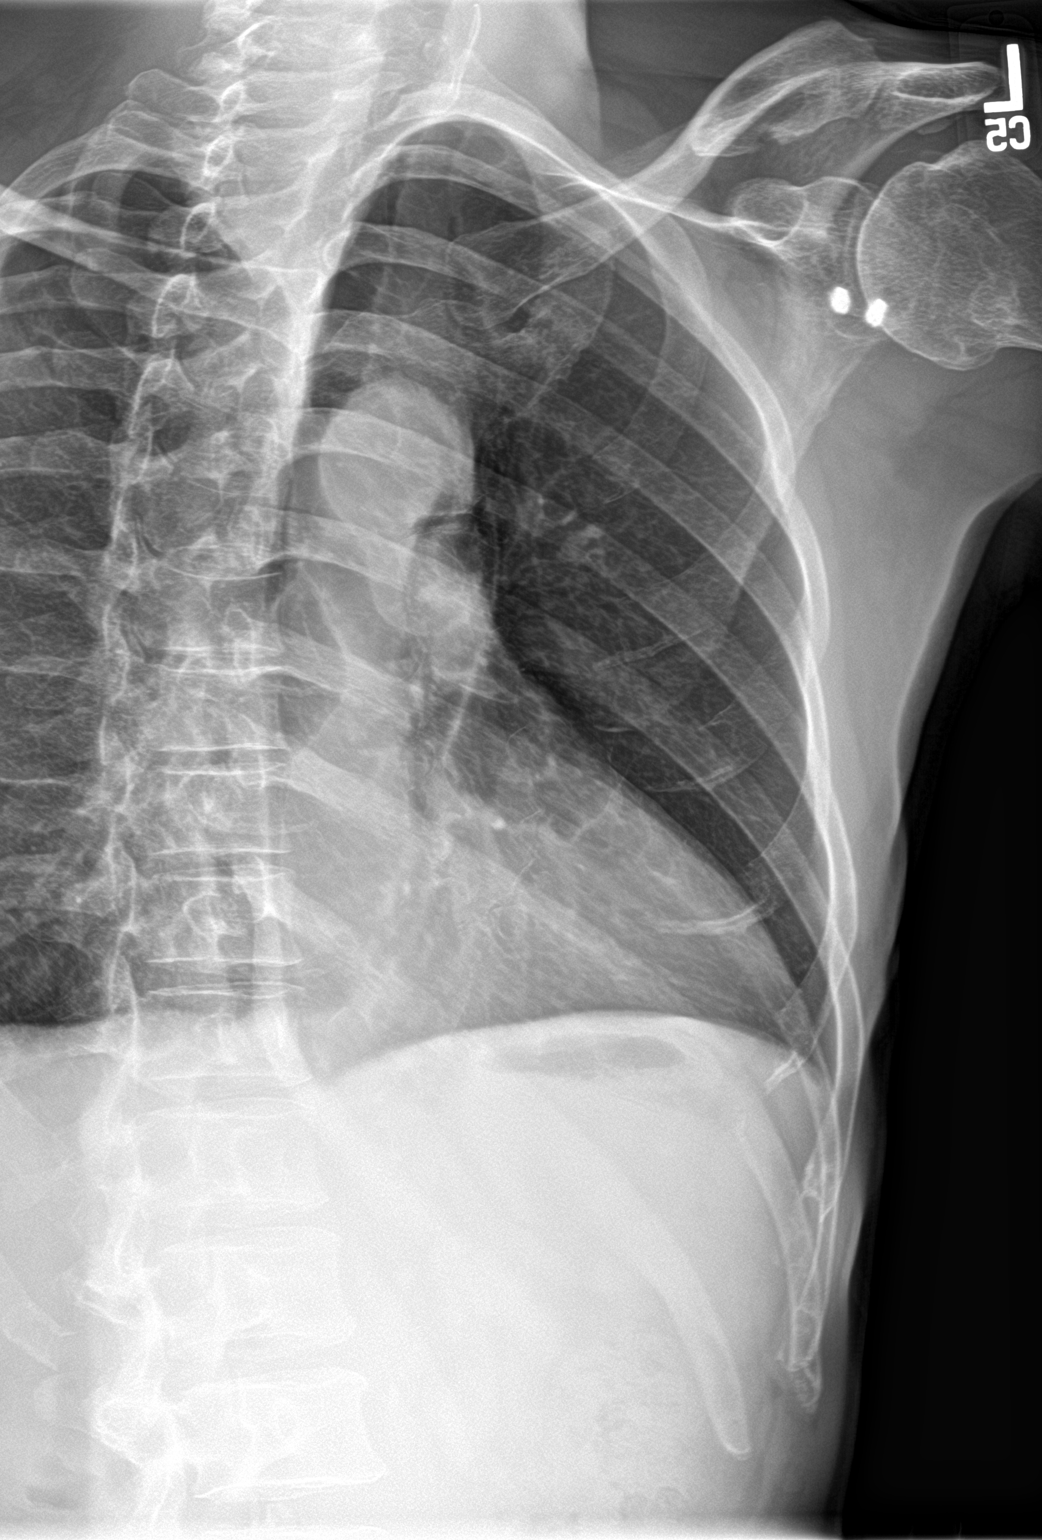

[3 of 3 positions shown; findings below may reference images not displayed]

FINDINGS: The heart size and mediastinal contours are stable. The lungs are
clear. There are chronic emphysematous changes at the right lung
apex. No evidence of pleural effusion or pneumothorax.

No evidence of acute left-sided rib fracture. There are postsurgical
changes at the left shoulder without evidence of an old injury of
the distal left clavicle and coracoclavicular ligament.
IMPRESSION: No evidence of acute left-sided rib fracture, pleural effusion or
pneumothorax.

## 2020-06-09 MED FILL — LABETALOL HCL 100MG TABLET: 100 | 90 days supply | Qty: 180 | Fill #0

## 2020-06-17 MED FILL — BIKTARVY 50-200-25 MG TABS: 50-200-25 | 30 days supply | Qty: 30 | Fill #9

## 2020-06-18 ENCOUNTER — Other Ambulatory Visit: Payer: Self-pay | Admitting: Family Medicine

## 2020-06-18 ENCOUNTER — Other Ambulatory Visit (HOSPITAL_COMMUNITY): Payer: Self-pay | Admitting: Family Medicine

## 2020-06-18 MED ORDER — LISINOPRIL 40 MG PO TABS
40.0000 mg | ORAL_TABLET | Freq: Every day | ORAL | 0 refills | Status: DC
Start: 2020-06-18 — End: 2020-07-02

## 2020-06-18 MED ORDER — LABETALOL HCL 100 MG PO TABS
100.0000 mg | ORAL_TABLET | Freq: Two times a day (BID) | ORAL | 0 refills | Status: DC
Start: 2020-06-18 — End: 2020-08-21

## 2020-06-18 MED FILL — LISINOPRIL 40 MG TABS: 40 | 90 days supply | Qty: 90 | Fill #0

## 2020-06-18 NOTE — Telephone Encounter (Signed)
Medication Refill - Medication: Hydralazine, Depakote, amlodipine, Tylenol, olanzapine, labetalol, lisinopril    Has the patient contacted their pharmacy? Yes.   (Agent: If no, request that the patient contact the pharmacy for the refill.) (Agent: If yes, when and what did the pharmacy advise?)  Preferred Pharmacy (with phone number or street name):  [facility]  [place] [address]  Phone: [phone] Fax: [phone]  Hours: Not open 24 hours     Agent: Please be advised that RX refills may take up to 3 business days. We ask that you follow-up with your pharmacy.

## 2020-06-18 NOTE — Telephone Encounter (Signed)
Phone call to pt.  Spoke with daughter, Heather Roberts, (on [place])  Advised of need for f/u appt. With Dr. Jacinto Reap.  Appt. Scheduled on 07/03/20 @ 8:00 AM.  Agreed with plan.  Requested Prescriptions  Pending Prescriptions Disp Refills  . OLANZapine (ZYPREXA) 5 MG tablet 30 tablet 3    Sig: Take 1 tablet (5 mg total) by mouth daily as needed (agitation).     Not Delegated - Psychiatry:  Antipsychotics - Second Generation (Atypical) - olanzapine Failed - 06/18/2020  3:19 PM      Failed - This refill cannot be delegated      Passed - ALT in normal range and within 360 days    ALT  Date Value Ref Range Status  03/12/2020 11 9 - 46 U/L Final         Passed - AST in normal range and within 360 days    AST  Date Value Ref Range Status  03/12/2020 14 10 - 35 U/L Final         Passed - Last BP in normal range    BP Readings from Last 1 Encounters:  05/04/20 126/87         Passed - Valid encounter within last 6 months    Recent Outpatient Visits          4 months ago Essential hypertension   [facility] [place], Dionne Bucy, MD   5 months ago Pre-operative clearance   [place], [place], [place]   8 months ago Stage 3b chronic kidney disease   [facility] [place], Dionne Bucy, MD   1 year ago Dementia associated with other underlying disease with behavioral disturbance [facility])   [facility] [place]igal[place], Dionne Bucy, MD   1 year ago Essential hypertension   [place], Dionne Bucy, MD      Future Appointments            In 2 weeks Bacigalupo, Dionne Bucy, MD [facility], [place]   In 4 months [place], Ples Specter, [place] for Infectious Disease, [facility]   In 4 months Stoioff, Ronda Fairly, MD [place]           . lisinopril (ZESTRIL) 40 MG tablet 90 tablet 0    Sig: Take 1 tablet (40 mg total) by mouth daily.     Cardiovascular:  ACE Inhibitors Failed -  06/18/2020  3:19 PM      Failed - Cr in normal range and within 180 days    Creat  Date Value Ref Range Status  03/12/2020 2.80 (H) 0.70 - 1.25 mg/dL Final    Comment:    For patients >71 years of age, the reference limit for Creatinine is approximately 13% higher for people identified as African-American. .    Creatinine,U  Date Value Ref Range Status  06/24/2008 336.8 mg/dL Final    Comment:    See lab report for associated comment(s)   Creatinine, Urine  Date Value Ref Range Status  11/24/2014 371.1 mg/dL Final    Comment:    Result repeated and verified. Result confirmed by automatic dilution. No reference range established.          Passed - K in normal range and within 180 days    Potassium  Date Value Ref Range Status  03/12/2020 4.1 3.5 - 5.3 mmol/L Final         Passed - Patient is not pregnant  Passed - Last BP in normal range    BP Readings from Last 1 Encounters:  05/04/20 126/87         Passed - Valid encounter within last 6 months    Recent Outpatient Visits          4 months ago Essential hypertension   [facility] [place], Dionne Bucy, MD   5 months ago Pre-operative clearance   [place], [place], [place]   8 months ago Stage 3b chronic kidney disease   [facility] [place], Dionne Bucy, MD   1 year ago Dementia associated with other underlying disease with behavioral disturbance [facility])   [facility], Dionne Bucy, MD   1 year ago Essential hypertension   [place], Dionne Bucy, MD      Future Appointments            In 2 weeks Bacigalupo, Dionne Bucy, MD [facility], [place]   In 4 months [place], Ples Specter, [place] for Infectious Disease, [facility]   In 4 months Stoioff, Ronda Fairly, MD [place]           . labetalol (NORMODYNE) 100 MG tablet 180 tablet 0    Sig: Take 1 tablet (100 mg  total) by mouth 2 (two) times daily.     Cardiovascular:  Beta Blockers Passed - 06/18/2020  3:19 PM      Passed - Last BP in normal range    BP Readings from Last 1 Encounters:  05/04/20 126/87         Passed - Last Heart Rate in normal range    Pulse Readings from Last 1 Encounters:  05/04/20 67         Passed - Valid encounter within last 6 months    Recent Outpatient Visits          4 months ago Essential hypertension   [facility] [place], Dionne Bucy, MD   5 months ago Pre-operative clearance   [place], [place], [place]   8 months ago Stage 3b chronic kidney disease   [facility] [place], Dionne Bucy, MD   1 year ago Dementia associated with other underlying disease with behavioral disturbance [facility])   [place], Dionne Bucy, MD   1 year ago Essential hypertension   [place], Dionne Bucy, MD      Future Appointments            In 2 weeks Bacigalupo, Dionne Bucy, MD [facility], [place]   In 4 months [place], Ples Specter, [place] for Infectious Disease, [facility]   In 4 months Stoioff, Ronda Fairly, MD [place]           . hydrALAZINE (APRESOLINE) 25 MG tablet       Cardiovascular:  Vasodilators Failed - 06/18/2020  3:19 PM      Failed - HGB in normal range and within 360 days    Hemoglobin  Date Value Ref Range Status  09/26/2019 13.1 (L) 13.2 - 17.1 g/dL Final         Failed - RBC in normal range and within 360 days    RBC  Date Value Ref Range Status  09/26/2019 4.08 (L) 4.20 - 5.80 Million/uL Final         Passed - HCT in normal range and within 360 days  HCT  Date Value Ref Range Status  09/26/2019 39.1 38.5 - 50.0 % Final         Passed - WBC in normal range and within 360 days    WBC  Date Value Ref Range Status  09/26/2019 6.1 3.8 - 10.8 Thousand/uL Final         Passed - PLT in normal range  and within 360 days    Platelets  Date Value Ref Range Status  09/26/2019 158 140 - 400 Thousand/uL Final         Passed - Last BP in normal range    BP Readings from Last 1 Encounters:  05/04/20 126/87         Passed - Valid encounter within last 12 months    Recent Outpatient Visits          4 months ago Essential hypertension   [facility] [place], Dionne Bucy, MD   5 months ago Pre-operative clearance   [place], [place], [place]   8 months ago Stage 3b chronic kidney disease   [facility] [place], Dionne Bucy, MD   1 year ago Dementia associated with other underlying disease with behavioral disturbance [facility])   [facility] Bacigal[place], Dionne Bucy, MD   1 year ago Essential hypertension   [place], Dionne Bucy, MD      Future Appointments            In 2 weeks Bacigalupo, Dionne Bucy, MD [facility], [place]   In 4 months [place], [place] for Infectious Disease, [facility]   In 4 months Stoioff, Ronda Fairly, MD [place]           . divalproex (DEPAKOTE SPRINKLE) 125 MG capsule 60 capsule 2    Sig: Take 2 capsules (250 mg total) by mouth every 12 (twelve) hours.     Not Delegated - Neurology:  Anticonvulsants - Valproates Failed - 06/18/2020  3:19 PM      Failed - This refill cannot be delegated      Failed - HGB in normal range and within 360 days    Hemoglobin  Date Value Ref Range Status  09/26/2019 13.1 (L) 13.2 - 17.1 g/dL Final         Failed - Valproic Acid (serum) in normal range and within 360 days    No results found for: VALPROATE, VPAT       Passed - AST in normal range and within 360 days    AST  Date Value Ref Range Status  03/12/2020 14 10 - 35 U/L Final         Passed - ALT in normal range and within 360 days    ALT  Date Value Ref Range Status  03/12/2020 11 9 - 46 U/L Final         Passed  - PLT in normal range and within 360 days    Platelets  Date Value Ref Range Status  09/26/2019 158 140 - 400 Thousand/uL Final         Passed - WBC in normal range and within 360 days    WBC  Date Value Ref Range Status  09/26/2019 6.1 3.8 - 10.8 Thousand/uL Final         Passed - HCT in normal range and within 360 days    HCT  Date Value Ref Range Status  09/26/2019 39.1 38.5 - 50.0 %  Final         Passed - Valid encounter within last 12 months    Recent Outpatient Visits          4 months ago Essential hypertension   [place], Dionne Bucy, MD   5 months ago Pre-operative clearance   [place], [place], [place]   8 months ago Stage 3b chronic kidney disease   [facility], Dionne Bucy, MD   1 year ago Dementia associated with other underlying disease with behavioral disturbance [facility])   [facility], Dionne Bucy, MD   1 year ago Essential hypertension   [place], Dionne Bucy, MD      Future Appointments            In 2 weeks Bacigalupo, Dionne Bucy, MD [facility], [place]   In 4 months [place], Ples Specter, [place] for Infectious Disease, [facility]   In 4 months Stoioff, Ronda Fairly, MD [place] Prescriptions Disp Refills  . amLODipine (NORVASC) 10 MG tablet 30 tablet 11    Sig: Take 1 tablet (10 mg total) by mouth daily.     Cardiovascular:  Calcium Channel Blockers Passed - 06/18/2020  3:19 PM      Passed - Last BP in normal range    BP Readings from Last 1 Encounters:  05/04/20 126/87         Passed - Valid encounter within last 6 months    Recent Outpatient Visits          4 months ago Essential hypertension   [place], Dionne Bucy, MD   5 months ago Pre-operative clearance   [place], [place], [place]   8 months ago Stage  3b chronic kidney disease   [facility] [place], Dionne Bucy, MD   1 year ago Dementia associated with other underlying disease with behavioral disturbance [facility])   [facility], Dionne Bucy, MD   1 year ago Essential hypertension   [place], Dionne Bucy, MD      Future Appointments            In 2 weeks Bacigalupo, Dionne Bucy, MD [facility], [place]   In 4 months [place], Ples Specter, [place] for Infectious Disease, [facility]   In 4 months Stoioff, Ronda Fairly, MD [place]           . acetaminophen (TYLENOL) 500 MG tablet 60 tablet 11    Sig: Take 1 tablet (500 mg total) by mouth in the morning and at bedtime.     Over the Counter:  OTC Passed - 06/18/2020  3:19 PM      Passed - Valid encounter within last 12 months    Recent Outpatient Visits          4 months ago Essential hypertension   [place], Dionne Bucy, MD   5 months ago Pre-operative clearance   [place], [place], [place]   8 months ago Stage 3b chronic kidney disease   [facility] [place], Dionne Bucy, MD   1 year ago Dementia associated with other underlying disease with behavioral disturbance [facility])   [facility], Dionne Bucy, MD   1 year ago Essential hypertension   [place],  Dionne Bucy, MD      Future Appointments            In 2 weeks Bacigalupo, Dionne Bucy, MD [facility], [place]   In 4 months [place], [place] for Infectious Disease, [facility]   In 4 months Stoioff, Ronda Fairly, MD [place]

## 2020-06-18 NOTE — Telephone Encounter (Signed)
Requested medication (s) are due for refill today:  Yes  Requested medication (s) are on the active medication list:  Yes  Future visit scheduled:  Yes  Last Refill: Olanzapine; 01/08/20; #30; RF x 3                   Divalproex; 04/09/20; #60; RF x 2                   Hydralazine  Historical  Notes to clinic:  Olanzapine and Divalproex are not delegated.  Hydralazine is from historical provider.   Requested Prescriptions  Pending Prescriptions Disp Refills   OLANZapine (ZYPREXA) 5 MG tablet 30 tablet 3    Sig: Take 1 tablet (5 mg total) by mouth daily as needed (agitation).      Not Delegated - Psychiatry:  Antipsychotics - Second Generation (Atypical) - olanzapine Failed - 06/18/2020  3:19 PM      Failed - This refill cannot be delegated      Passed - ALT in normal range and within 360 days    ALT  Date Value Ref Range Status  03/12/2020 11 9 - 46 U/L Final          Passed - AST in normal range and within 360 days    AST  Date Value Ref Range Status  03/12/2020 14 10 - 35 U/L Final          Passed - Last BP in normal range    BP Readings from Last 1 Encounters:  05/04/20 126/87          Passed - Valid encounter within last 6 months    Recent Outpatient Visits           4 months ago Essential hypertension   [facility] [place], Dionne Bucy, MD   5 months ago Pre-operative clearance   [place], [place], [place]   8 months ago Stage 3b chronic kidney disease   [facility] [place], Dionne Bucy, MD   1 year ago Dementia associated with other underlying disease with behavioral disturbance [facility])   [facility], Dionne Bucy, MD   1 year ago Essential hypertension   [place], Dionne Bucy, MD       Future Appointments             In 2 weeks Bacigalupo, Dionne Bucy, MD [facility], [facility]   In 4 months [place], Ples Specter, [place] for  Infectious Disease, [facility]   In 4 months Stoioff, Ronda Fairly, MD [facility]               hydrALAZINE (APRESOLINE) 25 MG tablet        Cardiovascular:  Vasodilators Failed - 06/18/2020  3:19 PM      Failed - HGB in normal range and within 360 days    Hemoglobin  Date Value Ref Range Status  09/26/2019 13.1 (L) 13.2 - 17.1 g/dL Final          Failed - RBC in normal range and within 360 days    RBC  Date Value Ref Range Status  09/26/2019 4.08 (L) 4.20 - 5.80 Million/uL Final          Passed - HCT in normal range and within 360 days    HCT  Date Value Ref Range Status  09/26/2019 39.1 38.5 - 50.0 % Final  Passed - WBC in normal range and within 360 days    WBC  Date Value Ref Range Status  09/26/2019 6.1 3.8 - 10.8 Thousand/uL Final          Passed - PLT in normal range and within 360 days    Platelets  Date Value Ref Range Status  09/26/2019 158 140 - 400 Thousand/uL Final          Passed - Last BP in normal range    BP Readings from Last 1 Encounters:  05/04/20 126/87          Passed - Valid encounter within last 12 months    Recent Outpatient Visits           4 months ago Essential hypertension   [facility] [place], Dionne Bucy, MD   5 months ago Pre-operative clearance   [place], [place], [place]   8 months ago Stage 3b chronic kidney disease   [facility] [place], Dionne Bucy, MD   1 year ago Dementia associated with other underlying disease with behavioral disturbance [facility])   [facility], Dionne Bucy, MD   1 year ago Essential hypertension   [place], Dionne Bucy, MD       Future Appointments             In 2 weeks Bacigalupo, Dionne Bucy, MD [facility], [facility]   In 4 months [place], [place] for Infectious Disease, [facility]   In 4 months Stoioff, Ronda Fairly, MD [facility]               divalproex (DEPAKOTE SPRINKLE) 125 MG capsule 60 capsule 2    Sig: Take 2 capsules (250 mg total) by mouth every 12 (twelve) hours.      Not Delegated - Neurology:  Anticonvulsants - Valproates Failed - 06/18/2020  3:19 PM      Failed - This refill cannot be delegated      Failed - HGB in normal range and within 360 days    Hemoglobin  Date Value Ref Range Status  09/26/2019 13.1 (L) 13.2 - 17.1 g/dL Final          Failed - Valproic Acid (serum) in normal range and within 360 days    No results found for: VALPROATE, VPAT        Passed - AST in normal range and within 360 days    AST  Date Value Ref Range Status  03/12/2020 14 10 - 35 U/L Final          Passed - ALT in normal range and within 360 days    ALT  Date Value Ref Range Status  03/12/2020 11 9 - 46 U/L Final          Passed - PLT in normal range and within 360 days    Platelets  Date Value Ref Range Status  09/26/2019 158 140 - 400 Thousand/uL Final          Passed - WBC in normal range and within 360 days    WBC  Date Value Ref Range Status  09/26/2019 6.1 3.8 - 10.8 Thousand/uL Final          Passed - HCT in normal range and within 360 days    HCT  Date Value Ref Range Status  09/26/2019 39.1 38.5 - 50.0 % Final  Passed - Valid encounter within last 12 months    Recent Outpatient Visits           4 months ago Essential hypertension   [place], Dionne Bucy, MD   5 months ago Pre-operative clearance   [place], [place], [place]   8 months ago Stage 3b chronic kidney disease   [facility] [place], Dionne Bucy, MD   1 year ago Dementia associated with other underlying disease with behavioral disturbance [facility])   [facility], Dionne Bucy, MD   1 year ago Essential hypertension   [place], Dionne Bucy, MD       Future Appointments              In 2 weeks Bacigalupo, Dionne Bucy, MD [facility], [facility]   In 4 months [place], [place] for Infectious Disease, [facility]   In 4 months Stoioff, Ronda Fairly, MD [facility]              Signed Prescriptions Disp Refills   lisinopril (ZESTRIL) 40 MG tablet 90 tablet 0    Sig: Take 1 tablet (40 mg total) by mouth daily.      Cardiovascular:  ACE Inhibitors Failed - 06/18/2020  3:19 PM      Failed - Cr in normal range and within 180 days    Creat  Date Value Ref Range Status  03/12/2020 2.80 (H) 0.70 - 1.25 mg/dL Final    Comment:    For patients >18 years of age, the reference limit for Creatinine is approximately 13% higher for people identified as African-American. .    Creatinine,U  Date Value Ref Range Status  06/24/2008 336.8 mg/dL Final    Comment:    See lab report for associated comment(s)   Creatinine, Urine  Date Value Ref Range Status  11/24/2014 371.1 mg/dL Final    Comment:    Result repeated and verified. Result confirmed by automatic dilution. No reference range established.           Passed - K in normal range and within 180 days    Potassium  Date Value Ref Range Status  03/12/2020 4.1 3.5 - 5.3 mmol/L Final          Passed - Patient is not pregnant      Passed - Last BP in normal range    BP Readings from Last 1 Encounters:  05/04/20 126/87          Passed - Valid encounter within last 6 months    Recent Outpatient Visits           4 months ago Essential hypertension   [place], Dionne Bucy, MD   5 months ago Pre-operative clearance   [place], [place], [place]   8 months ago Stage 3b chronic kidney disease   [facility] [place], Dionne Bucy, MD   1 year ago Dementia associated with other underlying disease with behavioral disturbance [facility])   [facility], Dionne Bucy, MD   1  year ago Essential hypertension   [place], Dionne Bucy, MD       Future Appointments             In 2 weeks Bacigalupo, Dionne Bucy, MD [facility], [place]   In 4 months [place] Breslow, Ples Specter, [place] for Infectious  Disease, [facility]   In 4 months Stoioff, Ronda Fairly, MD [facility]               labetalol (NORMODYNE) 100 MG tablet 180 tablet 0    Sig: Take 1 tablet (100 mg total) by mouth 2 (two) times daily.      Cardiovascular:  Beta Blockers Passed - 06/18/2020  3:19 PM      Passed - Last BP in normal range    BP Readings from Last 1 Encounters:  05/04/20 126/87          Passed - Last Heart Rate in normal range    Pulse Readings from Last 1 Encounters:  05/04/20 67          Passed - Valid encounter within last 6 months    Recent Outpatient Visits           4 months ago Essential hypertension   [facility] [place], Dionne Bucy, MD   5 months ago Pre-operative clearance   [place], [place], [place]   8 months ago Stage 3b chronic kidney disease   [facility] [place], Dionne Bucy, MD   1 year ago Dementia associated with other underlying disease with behavioral disturbance [facility])   [facility], Dionne Bucy, MD   1 year ago Essential hypertension   [place], Dionne Bucy, MD       Future Appointments             In 2 weeks Bacigalupo, Dionne Bucy, MD [facility], [facility]   In 4 months [place], [place] for Infectious Disease, [facility]   In 4 months Stoioff, Ronda Fairly, MD [facility]              Refused Prescriptions Disp Refills   amLODipine (NORVASC) 10 MG tablet 30 tablet 11    Sig: Take 1 tablet (10 mg total) by mouth daily.      Cardiovascular:  Calcium Channel Blockers Passed - 06/18/2020  3:19 PM      Passed - Last BP  in normal range    BP Readings from Last 1 Encounters:  05/04/20 126/87          Passed - Valid encounter within last 6 months    Recent Outpatient Visits           4 months ago Essential hypertension   [place], Dionne Bucy, MD   5 months ago Pre-operative clearance   [place], [place], [place]   8 months ago Stage 3b chronic kidney disease   [facility] [place], Dionne Bucy, MD   1 year ago Dementia associated with other underlying disease with behavioral disturbance [facility])   [facility], Dionne Bucy, MD   1 year ago Essential hypertension   [place], Dionne Bucy, MD       Future Appointments             In 2 weeks Bacigalupo, Dionne Bucy, MD [facility], [facility]   In 4 months [place] Breslow, Ples Specter, [place] for Infectious Disease, [facility]   In 4 months Stoioff, Ronda Fairly, MD [facility]               acetaminophen (TYLENOL) 500 MG tablet 60 tablet 11    Sig: Take 1 tablet (500 mg total) by mouth in  the morning and at bedtime.      Over the Counter:  OTC Passed - 06/18/2020  3:19 PM      Passed - Valid encounter within last 12 months    Recent Outpatient Visits           4 months ago Essential hypertension   [place], Dionne Bucy, MD   5 months ago Pre-operative clearance   [place], [place], [place]   8 months ago Stage 3b chronic kidney disease   [facility] [place], Dionne Bucy, MD   1 year ago Dementia associated with other underlying disease with behavioral disturbance [facility])   [facility], Dionne Bucy, MD   1 year ago Essential hypertension   [place], Dionne Bucy, MD       Future Appointments             In 2 weeks Bacigalupo, Dionne Bucy, MD [facility], [place]   In 4  months [place], Ples Specter, [place] for Infectious Disease, [facility]   In 4 months Stoioff, Ronda Fairly, MD [place]

## 2020-06-22 ENCOUNTER — Other Ambulatory Visit (HOSPITAL_COMMUNITY): Payer: Self-pay | Admitting: Family Medicine

## 2020-06-22 MED ORDER — OLANZAPINE 5 MG PO TABS
5.0000 mg | ORAL_TABLET | Freq: Every day | ORAL | 3 refills | Status: DC | PRN
Start: 2020-06-22 — End: 2020-09-07

## 2020-06-22 MED ORDER — DIVALPROEX SODIUM 125 MG PO CSDR: 250.0000 mg | DELAYED_RELEASE_CAPSULE | Freq: Two times a day (BID) | ORAL | 2 refills | Status: DC

## 2020-06-22 MED ORDER — HYDRALAZINE HCL 25 MG PO TABS
25.0000 mg | ORAL_TABLET | Freq: Three times a day (TID) | ORAL | 3 refills | Status: DC
Start: 2020-06-22 — End: 2020-09-07

## 2020-06-22 MED FILL — HYDRALAZINE HCL 25 MG TABS: 25 | 30 days supply | Qty: 90 | Fill #0

## 2020-06-22 MED FILL — OLANZapine 5 MG TABS: 5 | 30 days supply | Qty: 30 | Fill #0

## 2020-06-22 MED FILL — DIVALPROEX SODIUM 125 MG CA: 125 | 15 days supply | Qty: 60 | Fill #0

## 2020-06-23 ENCOUNTER — Other Ambulatory Visit: Payer: Self-pay | Admitting: Family Medicine

## 2020-06-23 ENCOUNTER — Other Ambulatory Visit (HOSPITAL_COMMUNITY): Payer: Self-pay | Admitting: Family Medicine

## 2020-06-23 MED FILL — ATORVASTATIN CALCIUM 20 MG: 20 | 90 days supply | Qty: 90 | Fill #0

## 2020-06-23 MED FILL — AMLODIPINE BESYLATE 10 MG T: 10 | 30 days supply | Qty: 30 | Fill #8

## 2020-06-26 DIAGNOSIS — Z03818 Encounter for observation for suspected exposure to other biological agents ruled out: Secondary | ICD-10-CM | POA: Diagnosis not present

## 2020-07-02 ENCOUNTER — Other Ambulatory Visit: Payer: Self-pay

## 2020-07-02 ENCOUNTER — Inpatient Hospital Stay (HOSPITAL_COMMUNITY)
Admission: EM | Admit: 2020-07-02 | Discharge: 2020-07-10 | DRG: 177 | Disposition: A | Discharge status: Hospice | Payer: Medicare HMO | Source: Skilled Nursing Facility | Attending: Internal Medicine | Admitting: Internal Medicine

## 2020-07-02 ENCOUNTER — Encounter (HOSPITAL_COMMUNITY): Payer: Self-pay

## 2020-07-02 ENCOUNTER — Emergency Department (HOSPITAL_COMMUNITY): Payer: Medicare HMO

## 2020-07-02 DIAGNOSIS — F17210 Nicotine dependence, cigarettes, uncomplicated: Secondary | ICD-10-CM | POA: Diagnosis present

## 2020-07-02 DIAGNOSIS — E875 Hyperkalemia: Secondary | ICD-10-CM | POA: Diagnosis present

## 2020-07-02 DIAGNOSIS — G9349 Other encephalopathy: Secondary | ICD-10-CM | POA: Diagnosis not present

## 2020-07-02 DIAGNOSIS — R627 Adult failure to thrive: Secondary | ICD-10-CM | POA: Diagnosis present

## 2020-07-02 DIAGNOSIS — Z515 Encounter for palliative care: Secondary | ICD-10-CM

## 2020-07-02 DIAGNOSIS — F03918 Unspecified dementia, unspecified severity, with other behavioral disturbance: Secondary | ICD-10-CM | POA: Diagnosis present

## 2020-07-02 DIAGNOSIS — E785 Hyperlipidemia, unspecified: Secondary | ICD-10-CM | POA: Diagnosis present

## 2020-07-02 DIAGNOSIS — Z823 Family history of stroke: Secondary | ICD-10-CM | POA: Diagnosis not present

## 2020-07-02 DIAGNOSIS — R4182 Altered mental status, unspecified: Secondary | ICD-10-CM | POA: Diagnosis present

## 2020-07-02 DIAGNOSIS — G9340 Encephalopathy, unspecified: Secondary | ICD-10-CM | POA: Diagnosis not present

## 2020-07-02 DIAGNOSIS — Z681 Body mass index (BMI) 19 or less, adult: Secondary | ICD-10-CM | POA: Diagnosis not present

## 2020-07-02 DIAGNOSIS — F0280 Dementia in other diseases classified elsewhere without behavioral disturbance: Secondary | ICD-10-CM | POA: Diagnosis present

## 2020-07-02 DIAGNOSIS — I639 Cerebral infarction, unspecified: Secondary | ICD-10-CM | POA: Diagnosis not present

## 2020-07-02 DIAGNOSIS — N184 Chronic kidney disease, stage 4 (severe): Secondary | ICD-10-CM | POA: Diagnosis present

## 2020-07-02 DIAGNOSIS — F0390 Unspecified dementia without behavioral disturbance: Secondary | ICD-10-CM | POA: Diagnosis present

## 2020-07-02 DIAGNOSIS — Z66 Do not resuscitate: Secondary | ICD-10-CM | POA: Diagnosis not present

## 2020-07-02 DIAGNOSIS — N1832 Chronic kidney disease, stage 3b: Secondary | ICD-10-CM | POA: Diagnosis present

## 2020-07-02 DIAGNOSIS — D6959 Other secondary thrombocytopenia: Secondary | ICD-10-CM | POA: Diagnosis present

## 2020-07-02 DIAGNOSIS — Z7189 Other specified counseling: Secondary | ICD-10-CM | POA: Diagnosis not present

## 2020-07-02 DIAGNOSIS — G9341 Metabolic encephalopathy: Secondary | ICD-10-CM | POA: Diagnosis present

## 2020-07-02 DIAGNOSIS — R17 Unspecified jaundice: Secondary | ICD-10-CM | POA: Diagnosis present

## 2020-07-02 DIAGNOSIS — J1282 Pneumonia due to coronavirus disease 2019: Secondary | ICD-10-CM | POA: Diagnosis present

## 2020-07-02 DIAGNOSIS — Z79899 Other long term (current) drug therapy: Secondary | ICD-10-CM | POA: Diagnosis not present

## 2020-07-02 DIAGNOSIS — I129 Hypertensive chronic kidney disease with stage 1 through stage 4 chronic kidney disease, or unspecified chronic kidney disease: Secondary | ICD-10-CM | POA: Diagnosis present

## 2020-07-02 DIAGNOSIS — G40909 Epilepsy, unspecified, not intractable, without status epilepticus: Secondary | ICD-10-CM | POA: Diagnosis present

## 2020-07-02 DIAGNOSIS — N183 Chronic kidney disease, stage 3 unspecified: Secondary | ICD-10-CM | POA: Diagnosis present

## 2020-07-02 DIAGNOSIS — Z8673 Personal history of transient ischemic attack (TIA), and cerebral infarction without residual deficits: Secondary | ICD-10-CM | POA: Diagnosis not present

## 2020-07-02 DIAGNOSIS — Z21 Asymptomatic human immunodeficiency virus [HIV] infection status: Secondary | ICD-10-CM | POA: Diagnosis present

## 2020-07-02 DIAGNOSIS — I160 Hypertensive urgency: Secondary | ICD-10-CM | POA: Diagnosis present

## 2020-07-02 DIAGNOSIS — Z0389 Encounter for observation for other suspected diseases and conditions ruled out: Secondary | ICD-10-CM | POA: Diagnosis not present

## 2020-07-02 DIAGNOSIS — R4020 Unspecified coma: Secondary | ICD-10-CM | POA: Diagnosis not present

## 2020-07-02 DIAGNOSIS — R609 Edema, unspecified: Secondary | ICD-10-CM | POA: Diagnosis not present

## 2020-07-02 DIAGNOSIS — R1310 Dysphagia, unspecified: Secondary | ICD-10-CM | POA: Diagnosis present

## 2020-07-02 DIAGNOSIS — N179 Acute kidney failure, unspecified: Secondary | ICD-10-CM | POA: Diagnosis present

## 2020-07-02 DIAGNOSIS — I10 Essential (primary) hypertension: Secondary | ICD-10-CM | POA: Diagnosis present

## 2020-07-02 DIAGNOSIS — M2550 Pain in unspecified joint: Secondary | ICD-10-CM | POA: Diagnosis not present

## 2020-07-02 DIAGNOSIS — R404 Transient alteration of awareness: Secondary | ICD-10-CM | POA: Diagnosis not present

## 2020-07-02 DIAGNOSIS — U071 COVID-19: Principal | ICD-10-CM | POA: Diagnosis present

## 2020-07-02 DIAGNOSIS — R32 Unspecified urinary incontinence: Secondary | ICD-10-CM | POA: Diagnosis present

## 2020-07-02 DIAGNOSIS — R5381 Other malaise: Secondary | ICD-10-CM | POA: Diagnosis present

## 2020-07-02 DIAGNOSIS — Z789 Other specified health status: Secondary | ICD-10-CM | POA: Diagnosis present

## 2020-07-02 DIAGNOSIS — B20 Human immunodeficiency virus [HIV] disease: Secondary | ICD-10-CM | POA: Diagnosis present

## 2020-07-02 DIAGNOSIS — I619 Nontraumatic intracerebral hemorrhage, unspecified: Secondary | ICD-10-CM | POA: Diagnosis not present

## 2020-07-02 DIAGNOSIS — R509 Fever, unspecified: Secondary | ICD-10-CM | POA: Diagnosis not present

## 2020-07-02 DIAGNOSIS — Z7401 Bed confinement status: Secondary | ICD-10-CM | POA: Diagnosis not present

## 2020-07-02 DIAGNOSIS — R29818 Other symptoms and signs involving the nervous system: Secondary | ICD-10-CM | POA: Diagnosis not present

## 2020-07-02 LAB — COMPREHENSIVE METABOLIC PANEL
ALT: 18 U/L (ref 0–44)
AST: 18 U/L (ref 15–41)
Albumin: 4.1 g/dL (ref 3.5–5.0)
Alkaline Phosphatase: 71 U/L (ref 38–126)
Anion gap: 11 (ref 5–15)
BUN: 25 mg/dL — ABNORMAL HIGH (ref 8–23)
CO2: 24 mmol/L (ref 22–32)
Calcium: 9.2 mg/dL (ref 8.9–10.3)
Chloride: 106 mmol/L (ref 98–111)
Creatinine, Ser: 2.86 mg/dL — ABNORMAL HIGH (ref 0.61–1.24)
GFR, Estimated: 24 mL/min — ABNORMAL LOW (ref >60)
Glucose, Bld: 101 mg/dL — ABNORMAL HIGH (ref 70–99)
Potassium: 4.4 mmol/L (ref 3.5–5.1)
Sodium: 141 mmol/L (ref 135–145)
Total Bilirubin: 0.8 mg/dL (ref 0.3–1.2)
Total Protein: 7.5 g/dL (ref 6.5–8.1)

## 2020-07-02 LAB — URINALYSIS, ROUTINE W REFLEX MICROSCOPIC
Bilirubin Urine: NEGATIVE
Glucose, UA: NEGATIVE mg/dL
Ketones, ur: NEGATIVE mg/dL
Leukocytes,Ua: NEGATIVE
Nitrite: NEGATIVE
Protein, ur: 100 mg/dL — AB
Specific Gravity, Urine: 1.02 (ref 1.005–1.030)
pH: 5 (ref 5.0–8.0)

## 2020-07-02 LAB — CBC WITH DIFFERENTIAL/PLATELET
Abs Immature Granulocytes: 0.04 10*3/uL (ref 0.00–0.07)
Basophils Absolute: 0 10*3/uL (ref 0.0–0.1)
Basophils Relative: 0 %
Eosinophils Absolute: 0 10*3/uL (ref 0.0–0.5)
Eosinophils Relative: 0 %
HCT: 41.5 % (ref 39.0–52.0)
Hemoglobin: 14.1 g/dL (ref 13.0–17.0)
Immature Granulocytes: 1 %
Lymphocytes Relative: 16 %
Lymphs Abs: 1.3 10*3/uL (ref 0.7–4.0)
MCH: 32.6 pg (ref 26.0–34.0)
MCHC: 34 g/dL (ref 30.0–36.0)
MCV: 95.8 fL (ref 80.0–100.0)
Monocytes Absolute: 0.8 10*3/uL (ref 0.1–1.0)
Monocytes Relative: 10 %
Neutro Abs: 6 10*3/uL (ref 1.7–7.7)
Neutrophils Relative %: 73 %
Platelets: 122 10*3/uL — ABNORMAL LOW (ref 150–400)
RBC: 4.33 MIL/uL (ref 4.22–5.81)
RDW: 13.4 % (ref 11.5–15.5)
WBC: 8.2 10*3/uL (ref 4.0–10.5)
nRBC: 0 % (ref 0.0–0.2)

## 2020-07-02 LAB — POC SARS CORONAVIRUS 2 AG -  ED: SARS Coronavirus 2 Ag: POSITIVE — AB

## 2020-07-02 LAB — PROTIME-INR
INR: 1.1 (ref 0.8–1.2)
Prothrombin Time: 13.5 seconds (ref 11.4–15.2)

## 2020-07-02 LAB — SARS CORONAVIRUS 2 (TAT 6-24 HRS): SARS Coronavirus 2: POSITIVE — AB

## 2020-07-02 LAB — LACTIC ACID, PLASMA
Lactic Acid, Venous: 1.3 mmol/L (ref 0.5–1.9)
Lactic Acid, Venous: 0.9 mmol/L (ref 0.5–1.9)

## 2020-07-02 LAB — VALPROIC ACID LEVEL: Valproic Acid Lvl: 46 ug/mL — ABNORMAL LOW (ref 50.0–100.0)

## 2020-07-02 LAB — TSH: TSH: 0.734 u[IU]/mL (ref 0.350–4.500)

## 2020-07-02 LAB — APTT: aPTT: 33 seconds (ref 24–36)

## 2020-07-02 MED ORDER — SODIUM CHLORIDE 0.9% FLUSH
3.0000 mL | Freq: Two times a day (BID) | INTRAVENOUS | Status: DC
  Administered 2020-07-02 – 2020-07-08 (×12): 3 mL via INTRAVENOUS

## 2020-07-02 MED ORDER — SODIUM CHLORIDE 0.9 % IV SOLN
2.0000 g | INTRAVENOUS | Status: DC
  Administered 2020-07-02: 2 g via INTRAVENOUS
  Filled 2020-07-02: qty 20

## 2020-07-02 MED ORDER — ACETAMINOPHEN 650 MG RE SUPP
650.0000 mg | Freq: Four times a day (QID) | RECTAL | Status: DC | PRN
  Filled 2020-07-02: qty 1

## 2020-07-02 MED ORDER — DIVALPROEX SODIUM 125 MG PO CSDR
250.0000 mg | DELAYED_RELEASE_CAPSULE | Freq: Two times a day (BID) | ORAL | Status: DC
  Filled 2020-07-02 (×2): qty 2

## 2020-07-02 MED ORDER — HYDRALAZINE HCL 25 MG PO TABS
25.0000 mg | ORAL_TABLET | Freq: Three times a day (TID) | ORAL | Status: DC
  Administered 2020-07-02: 25 mg via ORAL
  Filled 2020-07-02: qty 1

## 2020-07-02 MED ORDER — BICTEGRAVIR-EMTRICITAB-TENOFOV 50-200-25 MG PO TABS
1.0000 | ORAL_TABLET | Freq: Every day | ORAL | Status: DC
  Administered 2020-07-05 – 2020-07-10 (×5): 1 via ORAL
  Filled 2020-07-02 (×8): qty 1

## 2020-07-02 MED ORDER — ACETAMINOPHEN 325 MG PO TABS
650.0000 mg | ORAL_TABLET | Freq: Four times a day (QID) | ORAL | Status: DC | PRN
  Administered 2020-07-05: 650 mg via ORAL
  Filled 2020-07-02: qty 2

## 2020-07-02 MED ORDER — OLANZAPINE 5 MG PO TABS
5.0000 mg | ORAL_TABLET | Freq: Every day | ORAL | Status: DC | PRN
Start: 2020-07-02 — End: 2020-07-07
  Filled 2020-07-02: qty 1

## 2020-07-02 MED ORDER — ATORVASTATIN CALCIUM 10 MG PO TABS: 20.0000 mg | ORAL_TABLET | Freq: Every day | ORAL | Status: DC

## 2020-07-02 MED ORDER — DIVALPROEX SODIUM 125 MG PO CSDR: 250.0000 mg | DELAYED_RELEASE_CAPSULE | Freq: Two times a day (BID) | ORAL | Status: DC

## 2020-07-02 MED ORDER — ACETAMINOPHEN 325 MG PO TABS
650.0000 mg | ORAL_TABLET | Freq: Once | ORAL | Status: AC
  Administered 2020-07-02: 650 mg via ORAL
  Filled 2020-07-02: qty 2

## 2020-07-02 MED ORDER — LABETALOL HCL 200 MG PO TABS
100.0000 mg | ORAL_TABLET | Freq: Two times a day (BID) | ORAL | Status: DC
  Filled 2020-07-02: qty 1

## 2020-07-02 MED ORDER — LACTATED RINGERS IV SOLN: INTRAVENOUS | Status: DC

## 2020-07-02 MED ORDER — AMLODIPINE BESYLATE 5 MG PO TABS: 10.0000 mg | ORAL_TABLET | Freq: Every day | ORAL | Status: DC

## 2020-07-02 MED ORDER — SODIUM CHLORIDE 0.9 % IV SOLN
250.0000 mg | Freq: Once | INTRAVENOUS | Status: DC
  Filled 2020-07-02: qty 2.5

## 2020-07-02 MED ORDER — DONEPEZIL HCL 10 MG PO TABS
10.0000 mg | ORAL_TABLET | Freq: Every day | ORAL | Status: DC
Start: 2020-07-03 — End: 2020-07-03
  Filled 2020-07-02: qty 1

## 2020-07-02 MED ORDER — POLYETHYLENE GLYCOL 3350 17 G PO PACK: 17.0000 g | PACK | Freq: Every day | ORAL | Status: DC | PRN

## 2020-07-02 MED ORDER — SODIUM CHLORIDE 0.9 % IV SOLN
500.0000 mg | INTRAVENOUS | Status: DC
  Administered 2020-07-02: 500 mg via INTRAVENOUS
  Filled 2020-07-02: qty 500

## 2020-07-02 NOTE — Progress Notes (Signed)
Following for code sepsis 

## 2020-07-02 NOTE — ED Notes (Signed)
Able to speak with lab and will add on TSH.

## 2020-07-02 NOTE — H&P (Signed)
History and Physical   Peter Cox XKG:[id number] DOB: June 22, 1960 DOA: 07/02/2020  PCP: [name], MD   Patient coming from: [facility] assisted living  Chief Complaint: Altered mental status, chills  HPI: Peter Cox is a 60 y.o. male with medical history significant of alcohol use, CKD 3, HIV dementia, HIV, hypertension, hyperlipidemia, history of CVA, physical deconditioning, urinary incontinence who presents today afternoon to be more confused and having chills at his facility.  Due to patient's altered mental status history obtained with assistance of chart review and family.  As above patient was sent from his assisted living facility due to increased confusion and chills and not being able to ambulate with worsening of his deconditioning.  Per patient's daughter, she saw him Wednesday and he was at his baseline which is being able to ambulate and hold a conversation. He has had fever in the ED and is also had some recent nausea, vomiting, diarrhea.  He also has a chronic cough for his daughter.  He denies chest pain.  ED Course: Vital signs in the ED significant for fever of 102.  Blood pressure elevated in the 702O to 378H systolic.  Lab work-up showed BMP with BUN 25 and creatinine of 2.86 which is near his baseline.  LFTs within normal limits.  CBC showed platelets of 122.  PT, PTT, INR within normal notes.  Lactic acid normal x2.  COVID screen positive.  Urinalysis showed only rare bacteria, urine culture and blood cultures pending.  Valproic acid level low at 46.  Chest x-ray was without acute abnormality.  Patient started on CAP coverage initially and IV fluids.  Review of Systems: As per HPI otherwise all other systems reviewed and are negative.  Past Medical History:  Diagnosis Date  . Alcohol abuse    heavy, clean since April  . Alcohol abuse 11/24/2014  . Cocaine abuse ([place])     clean since about 2000  . Drug abuse ([place]) 11/24/2014  . HIV infection ([place])     dx 08/08/2008 (unknown how he was exposed)  Initial VL 43,200 and CD4 410 on 08/28/08  . Hyperlipidemia   . Hypertension   . Marijuana abuse   . Mild cognitive impairment    likely vascular dementia/psa/?HIV componentCT Head 02/12/08 for htn crisis and concern for stroke: impression-1. Old right internal capsule and thalamic lacunar infarcts. 2. Mild to moderate chronic small vessell white matter ischemic changes in both cerebral hemisphere, greater on the right  . Neuromuscular disorder ([place])   . Seizures ([place])    "last year had seizure"  . Stroke ([place])   . Tobacco abuse     Past Surgical History:  Procedure Laterality Date  . CLOSED REDUCTION PATELLAR     right knee  . HEMORRHOID SURGERY    . SHOULDER ARTHROSCOPY  01/2008   w/extensive debridement (Dr Mardelle Matte)    Social History  reports that he has been smoking cigarettes. He has a 20.00 pack-year smoking history. He has never used smokeless tobacco. He reports previous alcohol use. He reports previous drug use. Frequency: 7.00 times per week. Drug: Marijuana.  No Known Allergies  Family History  Problem Relation Age of Onset  . Diabetes Mother   . Hypertension Mother   . Stroke Mother   . Diabetes Father   . Hypertension Sister   . Hypertension Sister   . Colon cancer Neg Hx   Reviewed on edition  Prior to Admission medications   Medication Sig Start  Date End Date Taking? Authorizing Provider  acetaminophen (TYLENOL) 500 MG tablet Take 1 tablet (500 mg total) by mouth in the morning and at bedtime. 10/15/19   [name], MD  amLODipine (NORVASC) 10 MG tablet Take 1 tablet (10 mg total) by mouth daily. 09/27/19   Mar Daring, PA-C  atorvastatin (LIPITOR) 20 MG tablet TAKE 1 TABLET BY MOUTH ONCE A DAY 06/23/20   Bacigalupo, Dionne Bucy, MD  bictegravir-emtricitabine-tenofovir AF (BIKTARVY) 50-200-25 MG TABS tablet Take 1 tablet by mouth daily. 10/10/19   Golden Circle, FNP  cyanocobalamin 100 MCG tablet  Take by mouth.    [provider]  divalproex (DEPAKOTE SPRINKLE) 125 MG capsule Take 2 capsules (250 mg total) by mouth every 12 (twelve) hours. 06/22/20   [name], MD  donepezil (ARICEPT) 10 MG tablet Take 1/2 tablet daily for 2 weeks, then increase to 1 tablet daily Patient taking differently: Take 10 mg by mouth daily.  05/21/19   [name], MD  hydrALAZINE (APRESOLINE) 25 MG tablet Take 1 tablet (25 mg total) by mouth 3 (three) times daily. 06/22/20   Bacigalupo, Dionne Bucy, MD  labetalol (NORMODYNE) 100 MG tablet Take 1 tablet (100 mg total) by mouth 2 (two) times daily. 06/18/20   [name], MD  lisinopril (ZESTRIL) 40 MG tablet Take 1 tablet (40 mg total) by mouth daily. 06/18/20   Bacigalupo, Dionne Bucy, MD  OLANZapine (ZYPREXA) 5 MG tablet Take 1 tablet (5 mg total) by mouth daily as needed (agitation). 06/22/20   [name], MD  phenylephrine-shark liver oil-mineral oil-petrolatum (PREPARATION H) 0.25-14-74.9 % rectal ointment Place 1 application rectally 2 (two) times daily as needed for hemorrhoids. 10/15/19   [name], MD  Vitamins/Minerals TABS Take by mouth. 08/26/17   [provider]    Physical Exam: Vitals:   07/02/20 1954 07/02/20 1954 07/02/20 2019 07/02/20 2115  BP:   (!) 157/102 (!) 193/98  Pulse:   80 78  Resp:   (!) 27 13  Temp:   (!) 102.3 F (39.1 C)   TempSrc:   Oral   SpO2:  98% 97% 99%  Weight: 60.8 kg     Height: 5\' 9"  (1.753 m)      Physical Exam Constitutional:      General: He is not in acute distress.    Appearance: Normal appearance.  HENT:     Head: Normocephalic and atraumatic.     Mouth/Throat:     Mouth: Mucous membranes are moist.     Pharynx: Oropharynx is clear.  Eyes:     Extraocular Movements: Extraocular movements intact.     Pupils: Pupils are equal, round, and reactive to light.  Cardiovascular:     Rate and Rhythm: Normal rate and regular rhythm.     Pulses: Normal  pulses.     Heart sounds: Normal heart sounds.  Pulmonary:     Effort: Pulmonary effort is normal. No respiratory distress.     Breath sounds: Normal breath sounds.  Abdominal:     General: Bowel sounds are normal. There is no distension.     Palpations: Abdomen is soft.     Tenderness: There is no abdominal tenderness.  Musculoskeletal:        General: No swelling or deformity.  Skin:    General: Skin is warm and dry.  Neurological:     General: No focal deficit present.     Comments: Alert and oriented to self  and hospital     Labs on Admission: I have personally reviewed following labs and imaging studies  CBC: Recent Labs  Lab 07/02/20 1635  WBC 8.2  NEUTROABS 6.0  HGB 14.1  HCT 41.5  MCV 95.8  PLT 122*    Basic Metabolic Panel: Recent Labs  Lab 07/02/20 1635  NA 141  K 4.4  CL 106  CO2 24  GLUCOSE 101*  BUN 25*  CREATININE 2.86*  CALCIUM 9.2    GFR: Estimated Creatinine Clearance: 23.3 mL/min (A) (by C-G formula based on SCr of 2.86 mg/dL (H)).  Liver Function Tests: Recent Labs  Lab 07/02/20 1635  AST 18  ALT 18  ALKPHOS 71  BILITOT 0.8  PROT 7.5  ALBUMIN 4.1    Urine analysis:    Component Value Date/Time   COLORURINE YELLOW 07/02/2020 1635   APPEARANCEUR HAZY (A) 07/02/2020 1635   LABSPEC 1.020 07/02/2020 1635   PHURINE 5.0 07/02/2020 1635   GLUCOSEU NEGATIVE 07/02/2020 1635   GLUCOSEU NEG mg/dL 08/28/2008 2107   HGBUR SMALL (A) 07/02/2020 1635   HGBUR large 06/24/2008 0948   BILIRUBINUR NEGATIVE 07/02/2020 1635   KETONESUR NEGATIVE 07/02/2020 1635   PROTEINUR 100 (A) 07/02/2020 1635   UROBILINOGEN 0.2 12/07/2012 1042   NITRITE NEGATIVE 07/02/2020 1635   LEUKOCYTESUR NEGATIVE 07/02/2020 1635    Radiological Exams on Admission: DG Chest Port 1 View  Result Date: 07/02/2020 CLINICAL DATA:  Possible sepsis EXAM: PORTABLE CHEST 1 VIEW COMPARISON:  07/22/2019 FINDINGS: No consolidation or edema. No pleural effusion.  Cardiomediastinal contours are within normal limits. Degenerative changes at the left glenohumeral joint with postsurgical changes. IMPRESSION: No acute process in the chest. Electronically Signed   By: Macy Mis M.D.   On: 07/02/2020 16:49    EKG: Independently reviewed.  Sinus rhythm at 78 bpm.  Some nonspecific T wave changes in the lateral leads and a QTC of 45.  Assessment/Plan Principal Problem:   Acute encephalopathy Active Problems:   Human immunodeficiency virus (HIV) disease (HCC)   HLD (hyperlipidemia)   Essential hypertension   CKD (chronic kidney disease), stage III (HCC)   Dementia without behavioral disturbance (HCC)   History of CVA (cerebrovascular accident)   Decreased activities of daily living (ADL)   Physical deconditioning  COVID-19 infection Acute encephalopathy on dementia (HIV) > Patient with acute encephalopathy.  No electrolyte disturbance, no sign of bacterial infection.  Is positive for COVID with fever of 102 this is likely contributing. > Patient's facility states they will be unable to care for him in his current state > No oxygen requirements.  Saturating well on room air. > No nuchal rigidity no headaches - Follow-up urine culture and blood culture - Trend fever curve and white count - Continue home donepezil, as needed olanzapine - Stop antibiotics - As needed Tylenol - PT OT evaluation  Physical deconditioning > Has had recent weakness and worsening of his physical deconditioning as above likely due to his nausea vomiting and diarrhea secondary to COVID-19. - Supportive care - PT and OT evaluation as above  History of seizure > On valproic acid 250 twice daily - 1 dose of valproic acid IV tonight and try to resume p.o. tomorrow morning  HIV - Continue home retrovirals  Hypertension - Hold lisinopril - Continue amlodipine, hydralazine, labetalol  Hx CVA Hyperlipidemia - Continue home statin  CKD > Creatinine relatively stable  at 2.86 baseline appears to be around 2.6. - Maintenance fluids overnight - Avoid nephrotoxic agents -  Trend renal function and electrolytes  DVT prophylaxis: Lovenox Code Status:   Full  Family Communication:  None on admission  Disposition Plan:   Patient is from:  [facility] assisted living  Anticipated DC to:  Same as above  Anticipated DC date:  1 to 3 days  Anticipated DC barriers: Physical deconditioning  Consults called:  None  Admission status:  Observation, telemetry   Severity of Illness: The appropriate patient status for this patient is OBSERVATION. Observation status is judged to be reasonable and necessary in order to provide the required intensity of service to ensure the patient's safety. The patient's presenting symptoms, physical exam findings, and initial radiographic and laboratory data in the context of their medical condition is felt to place them at decreased risk for further clinical deterioration. Furthermore, it is anticipated that the patient will be medically stable for discharge from the hospital within 2 midnights of admission. The following factors support the patient status of observation.   " The patient's presenting symptoms include chills, fever, nausea, vomiting, diarrhea, weakness. " The physical exam findings include some confusion alert and oriented to self only. " The initial radiographic and laboratory data are significant for positive COVID-19 screen.  Otherwise labs stable.   Marcelyn Bruins MD [facility]  How to contact the [facility] Attending or Consulting provider [place] or covering provider during after hours [place], for this patient?   1. Check the care team in [facility] and look for a) attending/consulting TR[facility] provider listed and b) the [facility] team listed 2. Log into [email or web address] and use [place]'s universal password to access. If you do not have the password, please contact the hospital operator. 3. Locate the [facility] provider you  are looking for under [facility] and page to a number that you can be directly reached. 4. If you still have difficulty reaching the provider, please page the [facility] (Director on Call) for the Hospitalists listed on amion for assistance.  07/02/2020, 9:36 PM  \

## 2020-07-02 NOTE — ED Provider Notes (Signed)
[facility] EMERGENCY DEPARTMENT Provider Note   CSN: [id number] Arrival date & time: 07/02/20  1613     History Chief Complaint  Patient presents with  . Fever  . Chills    Peter Cox is a 60 y.o. male.  HPI    Level 5 caveat for altered mental status.  60 year old male with history of cocaine abuse, alcohol abuse, HIV on antiretroviral, early dementia, stroke comes in with chief complaint of chills.  Patient resides at [facility] assisted living.  He is in the assisted living side of the nursing facility.  According to the nursing home, patient started developing chills today.  I spoke with patient's daughter who is the medical POA and she reports that she last saw him last Wednesday and patient at baseline is able to ambulate and hold a conversation.  Patient is oriented to self, location but not the year.  He reports feeling weak.  He has a cough, but it is not new.  Daughter confirms that patient has chronic cough history.   The nursing home is unsure if patient is vaccinated.  The daughter informs me that patient is not vaccinated.  CODE STATUS is full code, and family would want intubation unless the prognosis extremely poor.  Past Medical History:  Diagnosis Date  . Alcohol abuse    heavy, clean since April  . Alcohol abuse 11/24/2014  . Cocaine abuse ([place])     clean since about 2000  . Drug abuse ([place]) 11/24/2014  . HIV infection ([place])    dx 08/08/2008 (unknown how he was exposed)  Initial VL 43,200 and CD4 410 on 08/28/08  . Hyperlipidemia   . Hypertension   . Marijuana abuse   . Mild cognitive impairment    likely vascular dementia/psa/?HIV componentCT Head 02/12/08 for htn crisis and concern for stroke: impression-1. Old right internal capsule and thalamic lacunar infarcts. 2. Mild to moderate chronic small vessell white matter ischemic changes in both cerebral hemisphere, greater on the right  . Neuromuscular disorder ([place])   . Seizures  ([place])    "last year had seizure"  . Stroke ([place])   . Tobacco abuse     Patient Active Problem List   Diagnosis Date Noted  . Dysphagia 02/06/2020  . Physical deconditioning 02/06/2020  . History of substance abuse ([place]) 09/17/2018  . Urinary incontinence 09/17/2018  . Bilateral primary osteoarthritis of knee 09/13/2018  . Decreased activities of daily living (ADL) 09/13/2018  . Alcohol abuse 11/24/2014  . Dementia without behavioral disturbance ([place]) 07/02/2014  . History of CVA (cerebrovascular accident) 07/02/2014  . CKD (chronic kidney disease), stage III ([place]) 11/20/2008  . Human immunodeficiency virus (HIV) disease ([place]) 08/17/2008  . HLD (hyperlipidemia) 06/26/2008  . Essential hypertension 06/24/2008    Past Surgical History:  Procedure Laterality Date  . CLOSED REDUCTION PATELLAR     right knee  . HEMORRHOID SURGERY    . SHOULDER ARTHROSCOPY  01/2008   w/extensive debridement (Dr Mardelle Matte)       Family History  Problem Relation Age of Onset  . Diabetes Mother   . Hypertension Mother   . Stroke Mother   . Diabetes Father   . Hypertension Sister   . Hypertension Sister   . Colon cancer Neg Hx     Social History   Tobacco Use  . Smoking status: Current Every Day Smoker    Packs/day: 0.50    Years: 40.00    Pack years: 20.00  Types: Cigarettes  . Smokeless tobacco: Never Used  . Tobacco comment: Quit  x 2 week.  Vaping Use  . Vaping Use: Never used  Substance Use Topics  . Alcohol use: Not Currently    Alcohol/week: 0.0 standard drinks    Comment: drink beer  . Drug use: Not Currently    Frequency: 7.0 times per week    Types: Marijuana    Comment: use Marijuana every day per pt.    Home Medications Prior to Admission medications   Medication Sig Start Date End Date Taking? Authorizing Provider  acetaminophen (TYLENOL) 500 MG tablet Take 1 tablet (500 mg total) by mouth in the morning and at bedtime. 10/15/19   [name], MD   amLODipine (NORVASC) 10 MG tablet Take 1 tablet (10 mg total) by mouth daily. 09/27/19   Mar Daring, PA-C  atorvastatin (LIPITOR) 20 MG tablet TAKE 1 TABLET BY MOUTH ONCE A DAY 06/23/20   Bacigalupo, Dionne Bucy, MD  bictegravir-emtricitabine-tenofovir AF (BIKTARVY) 50-200-25 MG TABS tablet Take 1 tablet by mouth daily. 10/10/19   Golden Circle, FNP  cyanocobalamin 100 MCG tablet Take by mouth.    [provider]  divalproex (DEPAKOTE SPRINKLE) 125 MG capsule Take 2 capsules (250 mg total) by mouth every 12 (twelve) hours. 06/22/20   [name], MD  donepezil (ARICEPT) 10 MG tablet Take 1/2 tablet daily for 2 weeks, then increase to 1 tablet daily Patient taking differently: Take 10 mg by mouth daily.  05/21/19   [name], MD  hydrALAZINE (APRESOLINE) 25 MG tablet Take 1 tablet (25 mg total) by mouth 3 (three) times daily. 06/22/20   Bacigalupo, Dionne Bucy, MD  labetalol (NORMODYNE) 100 MG tablet Take 1 tablet (100 mg total) by mouth 2 (two) times daily. 06/18/20   [name], MD  lisinopril (ZESTRIL) 40 MG tablet Take 1 tablet (40 mg total) by mouth daily. 06/18/20   Bacigalupo, Dionne Bucy, MD  OLANZapine (ZYPREXA) 5 MG tablet Take 1 tablet (5 mg total) by mouth daily as needed (agitation). 06/22/20   [name], MD  phenylephrine-shark liver oil-mineral oil-petrolatum (PREPARATION H) 0.25-14-74.9 % rectal ointment Place 1 application rectally 2 (two) times daily as needed for hemorrhoids. 10/15/19   [name], MD  Vitamins/Minerals TABS Take by mouth. 08/26/17   [provider]    Allergies    Patient has no known allergies.  Review of Systems   Review of Systems  Unable to perform ROS: Mental status change    Physical Exam Updated Vital Signs BP (!) 194/172   Pulse 86   Temp (!) 102.8 F (39.3 C) (Oral)   Resp 14   SpO2 100%   Physical Exam Vitals and nursing note reviewed.  Constitutional:      Appearance: He is  well-developed.  HENT:     Head: Atraumatic.  Cardiovascular:     Rate and Rhythm: Normal rate.  Pulmonary:     Effort: Pulmonary effort is normal.     Breath sounds: No wheezing, rhonchi or rales.  Abdominal:     Tenderness: There is no abdominal tenderness.  Musculoskeletal:     Cervical back: Neck supple.  Skin:    General: Skin is warm.  Neurological:     Mental Status: He is alert. Mental status is at baseline.     ED Results / Procedures / Treatments   Labs (all labs ordered are listed, but only abnormal results are displayed) Labs  Reviewed  COMPREHENSIVE METABOLIC PANEL - Abnormal; Notable for the following components:      Result Value   Glucose, Bld 101 (*)    BUN 25 (*)    Creatinine, Ser 2.86 (*)    GFR, Estimated 24 (*)    All other components within normal limits  CBC WITH DIFFERENTIAL/PLATELET - Abnormal; Notable for the following components:   Platelets 122 (*)    All other components within normal limits  URINALYSIS, ROUTINE W REFLEX MICROSCOPIC - Abnormal; Notable for the following components:   APPearance HAZY (*)    Hgb urine dipstick SMALL (*)    Protein, ur 100 (*)    Bacteria, UA RARE (*)    All other components within normal limits  VALPROIC ACID LEVEL - Abnormal; Notable for the following components:   Valproic Acid Lvl 46 (*)    All other components within normal limits  POC SARS CORONAVIRUS 2 AG -  ED - Abnormal; Notable for the following components:   SARS Coronavirus 2 Ag POSITIVE (*)    All other components within normal limits  CULTURE, BLOOD (ROUTINE X 2)  CULTURE, BLOOD (ROUTINE X 2)  URINE CULTURE  SARS CORONAVIRUS 2 (TAT 6-24 HRS)  LACTIC ACID, PLASMA  PROTIME-INR  APTT  LACTIC ACID, PLASMA    EKG EKG Interpretation  Date/Time:  Thursday July 02 2020 16:25:15 EST Ventricular Rate:  78 PR Interval:    QRS Duration: 79 QT Interval:  425 QTC Calculation: 485 R Axis:   80 Text Interpretation: Sinus rhythm Left  ventricular hypertrophy Nonspecific T abnormalities, lateral leads Borderline prolonged QT interval No acute changes No significant change since last tracing Confirmed by Varney Biles ([id number]) on 07/02/2020 7:28:22 PM   Radiology DG Chest Port 1 View  Result Date: 07/02/2020 CLINICAL DATA:  Possible sepsis EXAM: PORTABLE CHEST 1 VIEW COMPARISON:  07/22/2019 FINDINGS: No consolidation or edema. No pleural effusion. Cardiomediastinal contours are within normal limits. Degenerative changes at the left glenohumeral joint with postsurgical changes. IMPRESSION: No acute process in the chest. Electronically Signed   By: Macy Mis M.D.   On: 07/02/2020 16:49    Procedures .Critical Care Performed by: Varney Biles, MD Authorized by: Varney Biles, MD   Critical care provider statement:    Critical care time (minutes):  52   Critical care was necessary to treat or prevent imminent or life-threatening deterioration of the following conditions:  CNS failure or compromise   Critical care was time spent personally by me on the following activities:  Discussions with consultants, evaluation of patient's response to treatment, examination of patient, ordering and performing treatments and interventions, ordering and review of laboratory studies, ordering and review of radiographic studies, pulse oximetry, re-evaluation of patient's condition, obtaining history from patient or surrogate and review of old charts   (including critical care time)  Medications Ordered in ED Medications  lactated ringers infusion ( Intravenous New Bag/Given 07/02/20 1730)  lactated ringers infusion (has no administration in time range)  cefTRIAXone (ROCEPHIN) 2 g in sodium chloride 0.9 % 100 mL IVPB (0 g Intravenous Stopped 07/02/20 1831)  azithromycin (ZITHROMAX) 500 mg in sodium chloride 0.9 % 250 mL IVPB (500 mg Intravenous New Bag/Given 07/02/20 1830)  acetaminophen (TYLENOL) tablet 650 mg (has no administration in  time range)    ED Course  I have reviewed the triage vital signs and the nursing notes.  Pertinent labs & imaging results that were available during my care of the patient were reviewed  by me and considered in my medical decision making (see chart for details).    MDM Rules/Calculators/A&P                          60 year old comes in a chief complaint of chills.Marland Kitchen He is noted to be febrile and has cough.  Allegedly the cough is not new.  Per nursing facility, they do have some COVID-positive residents.  Patient is most likely not vaccinated against COVID-19.  He has history of HIV and dementia.  Currently is not hypoxic, tachycardic and although he appears to be weak, he answers my questions appropriately.  Concerns are high for COVID-19, along with CAP.  Appropriate antibiotics ordered.  7:30 PM  Patient reassessed.  He is confused, incontinent.  I confirmed with the nursing facility and the daughter that patient is at the assisted living side of the facility.  He is not appropriate for assisted living at this time given the weakness and encephalopathy.  He will be admitted to the hospital. Deferring COVID-19 treatment regimen to the admitting team.   Final Clinical Impression(s) / ED Diagnoses Final diagnoses:  COVID-19  Encephalopathy due to COVID-19 virus    Rx / DC Orders ED Discharge Orders    None       Varney Biles, MD 07/02/20 1931

## 2020-07-02 NOTE — Progress Notes (Signed)
Asked bedside RN to check on why lactate has not resulted. The repeat lactate is due @ 1835

## 2020-07-02 NOTE — Progress Notes (Signed)
Notified bedside nurse of need to administer antibiotics and fluids.  

## 2020-07-02 NOTE — ED Notes (Signed)
Still have not received depakote medication from pharmacy.

## 2020-07-02 NOTE — ED Notes (Signed)
Please call daug. With any questions and update of care the patient has dementia  and don't understand  questions sharondari [phone]

## 2020-07-02 NOTE — ED Triage Notes (Signed)
Pt BIB [facility] d/t feeling "col" & having chills. Cough was reported to have started 3 days ago & upon arrival to ED pt's temp was 102.8 oral.

## 2020-07-03 ENCOUNTER — Observation Stay (HOSPITAL_COMMUNITY): Payer: Medicare HMO

## 2020-07-03 ENCOUNTER — Ambulatory Visit: Payer: Self-pay | Admitting: Family Medicine

## 2020-07-03 DIAGNOSIS — R627 Adult failure to thrive: Secondary | ICD-10-CM | POA: Diagnosis present

## 2020-07-03 DIAGNOSIS — D6959 Other secondary thrombocytopenia: Secondary | ICD-10-CM | POA: Diagnosis present

## 2020-07-03 DIAGNOSIS — R4020 Unspecified coma: Secondary | ICD-10-CM | POA: Diagnosis not present

## 2020-07-03 DIAGNOSIS — Z515 Encounter for palliative care: Secondary | ICD-10-CM | POA: Diagnosis not present

## 2020-07-03 DIAGNOSIS — R4182 Altered mental status, unspecified: Secondary | ICD-10-CM | POA: Diagnosis present

## 2020-07-03 DIAGNOSIS — Z79899 Other long term (current) drug therapy: Secondary | ICD-10-CM | POA: Diagnosis not present

## 2020-07-03 DIAGNOSIS — G40909 Epilepsy, unspecified, not intractable, without status epilepticus: Secondary | ICD-10-CM | POA: Diagnosis present

## 2020-07-03 DIAGNOSIS — I129 Hypertensive chronic kidney disease with stage 1 through stage 4 chronic kidney disease, or unspecified chronic kidney disease: Secondary | ICD-10-CM | POA: Diagnosis present

## 2020-07-03 DIAGNOSIS — Z8673 Personal history of transient ischemic attack (TIA), and cerebral infarction without residual deficits: Secondary | ICD-10-CM | POA: Diagnosis not present

## 2020-07-03 DIAGNOSIS — Z823 Family history of stroke: Secondary | ICD-10-CM | POA: Diagnosis not present

## 2020-07-03 DIAGNOSIS — Z66 Do not resuscitate: Secondary | ICD-10-CM | POA: Diagnosis not present

## 2020-07-03 DIAGNOSIS — G9341 Metabolic encephalopathy: Secondary | ICD-10-CM | POA: Diagnosis present

## 2020-07-03 DIAGNOSIS — I639 Cerebral infarction, unspecified: Secondary | ICD-10-CM | POA: Diagnosis not present

## 2020-07-03 DIAGNOSIS — E785 Hyperlipidemia, unspecified: Secondary | ICD-10-CM | POA: Diagnosis present

## 2020-07-03 DIAGNOSIS — F0280 Dementia in other diseases classified elsewhere without behavioral disturbance: Secondary | ICD-10-CM | POA: Diagnosis present

## 2020-07-03 DIAGNOSIS — N1832 Chronic kidney disease, stage 3b: Secondary | ICD-10-CM | POA: Diagnosis present

## 2020-07-03 DIAGNOSIS — I160 Hypertensive urgency: Secondary | ICD-10-CM | POA: Diagnosis present

## 2020-07-03 DIAGNOSIS — R609 Edema, unspecified: Secondary | ICD-10-CM | POA: Diagnosis not present

## 2020-07-03 DIAGNOSIS — Z7189 Other specified counseling: Secondary | ICD-10-CM | POA: Diagnosis not present

## 2020-07-03 DIAGNOSIS — I10 Essential (primary) hypertension: Secondary | ICD-10-CM | POA: Diagnosis not present

## 2020-07-03 DIAGNOSIS — R1310 Dysphagia, unspecified: Secondary | ICD-10-CM | POA: Diagnosis present

## 2020-07-03 DIAGNOSIS — F17210 Nicotine dependence, cigarettes, uncomplicated: Secondary | ICD-10-CM | POA: Diagnosis present

## 2020-07-03 DIAGNOSIS — Z681 Body mass index (BMI) 19 or less, adult: Secondary | ICD-10-CM | POA: Diagnosis not present

## 2020-07-03 DIAGNOSIS — Z21 Asymptomatic human immunodeficiency virus [HIV] infection status: Secondary | ICD-10-CM | POA: Diagnosis present

## 2020-07-03 DIAGNOSIS — R17 Unspecified jaundice: Secondary | ICD-10-CM | POA: Diagnosis present

## 2020-07-03 DIAGNOSIS — I619 Nontraumatic intracerebral hemorrhage, unspecified: Secondary | ICD-10-CM | POA: Diagnosis not present

## 2020-07-03 DIAGNOSIS — J1282 Pneumonia due to coronavirus disease 2019: Secondary | ICD-10-CM | POA: Diagnosis present

## 2020-07-03 DIAGNOSIS — U071 COVID-19: Secondary | ICD-10-CM | POA: Diagnosis present

## 2020-07-03 DIAGNOSIS — F0390 Unspecified dementia without behavioral disturbance: Secondary | ICD-10-CM | POA: Diagnosis not present

## 2020-07-03 DIAGNOSIS — G9340 Encephalopathy, unspecified: Secondary | ICD-10-CM | POA: Diagnosis not present

## 2020-07-03 DIAGNOSIS — R32 Unspecified urinary incontinence: Secondary | ICD-10-CM | POA: Diagnosis present

## 2020-07-03 DIAGNOSIS — E875 Hyperkalemia: Secondary | ICD-10-CM | POA: Diagnosis present

## 2020-07-03 DIAGNOSIS — M2550 Pain in unspecified joint: Secondary | ICD-10-CM | POA: Diagnosis not present

## 2020-07-03 DIAGNOSIS — Z7401 Bed confinement status: Secondary | ICD-10-CM | POA: Diagnosis not present

## 2020-07-03 DIAGNOSIS — N179 Acute kidney failure, unspecified: Secondary | ICD-10-CM | POA: Diagnosis present

## 2020-07-03 DIAGNOSIS — G9349 Other encephalopathy: Secondary | ICD-10-CM | POA: Diagnosis not present

## 2020-07-03 LAB — COMPREHENSIVE METABOLIC PANEL
ALT: 13 U/L (ref 0–44)
AST: 56 U/L — ABNORMAL HIGH (ref 15–41)
Albumin: 3.4 g/dL — ABNORMAL LOW (ref 3.5–5.0)
Alkaline Phosphatase: 55 U/L (ref 38–126)
Anion gap: 11 (ref 5–15)
BUN: 23 mg/dL (ref 8–23)
CO2: 22 mmol/L (ref 22–32)
Calcium: 8.5 mg/dL — ABNORMAL LOW (ref 8.9–10.3)
Chloride: 105 mmol/L (ref 98–111)
Creatinine, Ser: 2.36 mg/dL — ABNORMAL HIGH (ref 0.61–1.24)
GFR, Estimated: 31 mL/min — ABNORMAL LOW (ref >60)
Glucose, Bld: 74 mg/dL (ref 70–99)
Potassium: 5.6 mmol/L — ABNORMAL HIGH (ref 3.5–5.1)
Sodium: 138 mmol/L (ref 135–145)
Total Bilirubin: 1.5 mg/dL — ABNORMAL HIGH (ref 0.3–1.2)
Total Protein: 6.3 g/dL — ABNORMAL LOW (ref 6.5–8.1)

## 2020-07-03 LAB — CBC
HCT: 38.6 % — ABNORMAL LOW (ref 39.0–52.0)
Hemoglobin: 12.7 g/dL — ABNORMAL LOW (ref 13.0–17.0)
MCH: 31.6 pg (ref 26.0–34.0)
MCHC: 32.9 g/dL (ref 30.0–36.0)
MCV: 96 fL (ref 80.0–100.0)
Platelets: 109 10*3/uL — ABNORMAL LOW (ref 150–400)
RBC: 4.02 MIL/uL — ABNORMAL LOW (ref 4.22–5.81)
RDW: 13.6 % (ref 11.5–15.5)
WBC: 5.9 10*3/uL (ref 4.0–10.5)
nRBC: 0 % (ref 0.0–0.2)

## 2020-07-03 MED ORDER — NITROGLYCERIN 2 % TD OINT: 1.0000 [in_us] | TOPICAL_OINTMENT | Freq: Four times a day (QID) | TRANSDERMAL | Status: DC

## 2020-07-03 MED ORDER — VALPROATE SODIUM 100 MG/ML IV SOLN
250.0000 mg | Freq: Two times a day (BID) | INTRAVENOUS | Status: DC
  Administered 2020-07-03 – 2020-07-04 (×4): 250 mg via INTRAVENOUS
  Filled 2020-07-03 (×6): qty 2.5

## 2020-07-03 MED ORDER — LABETALOL HCL 5 MG/ML IV SOLN
20.0000 mg | INTRAVENOUS | Status: DC | PRN
  Administered 2020-07-03 – 2020-07-08 (×3): 20 mg via INTRAVENOUS
  Filled 2020-07-03 (×3): qty 4

## 2020-07-03 MED ORDER — ISOSORBIDE MONONITRATE ER 30 MG PO TB24
30.0000 mg | ORAL_TABLET | Freq: Every day | ORAL | Status: DC
  Filled 2020-07-03: qty 1

## 2020-07-03 MED ORDER — NITROGLYCERIN 2 % TD OINT
1.0000 [in_us] | TOPICAL_OINTMENT | Freq: Four times a day (QID) | TRANSDERMAL | Status: DC
  Administered 2020-07-03 – 2020-07-07 (×17): 1 [in_us] via TOPICAL
  Filled 2020-07-03: qty 1
  Filled 2020-07-03: qty 30
  Filled 2020-07-03: qty 1
  Filled 2020-07-03: qty 30

## 2020-07-03 MED ORDER — LABETALOL HCL 5 MG/ML IV SOLN
20.0000 mg | Freq: Once | INTRAVENOUS | Status: AC
  Administered 2020-07-03: 20 mg via INTRAVENOUS
  Filled 2020-07-03: qty 4

## 2020-07-03 MED ORDER — AMLODIPINE BESYLATE 5 MG PO TABS
10.0000 mg | ORAL_TABLET | Freq: Every day | ORAL | Status: DC
  Administered 2020-07-03: 10 mg via ORAL
  Filled 2020-07-03: qty 2

## 2020-07-03 MED ORDER — HYDRALAZINE HCL 25 MG PO TABS
50.0000 mg | ORAL_TABLET | Freq: Three times a day (TID) | ORAL | Status: DC
  Filled 2020-07-03: qty 2

## 2020-07-03 NOTE — Progress Notes (Signed)
[facility] night shift telemetry coverage note.  The staff reports that the patient has a BP measurement of 209/117 mmHg.  His clinical status is otherwise unchanged.  He is currently on a lactated Ringer infusion 150 mL/h.  Given BP numbers, history of CKD and acute COVID infection.  Assessment/plan: Hypertensive urgency Hold IV fluids. Labetalol 20 mg IVP x1 dose now. Administer amlodipine 10 mg p.o. daily early. Labetalol 20 mg IVP Q 2 hours PRN SBP >159 or DBP >99 mmHg. Continue hydralazine 25 mg p.o. 3 times daily. Consider resuming lisinopril if renal function allows.  Tennis Must, MD.

## 2020-07-03 NOTE — Progress Notes (Addendum)
[facility] Progress Note  Patient: Peter Cox    CBS:[id number]  DOA: 07/02/2020     Date of Service: the patient was seen and examined on 07/03/2020  Brief hospital course: Past medical history of CKD 3B, HIV dementia, HTN, HLD, CVA, deconditioning dependency for ADL, incontinence, history of alcohol use.  From ALF. Presents with confusion and fever and chills.  Found to have COVID-19 pneumonia. Currently plan is monitor for improvement of oral intake and blood pressure.  Assessment and Plan: 1. Acute COVID-19 Viral Pneumonia CXR: No evidence of hazy bilateral peripheral opacities Oxygen requirement: Room air right now Remdesivir: Not indicated for now. Steroids: Not indicated Baricitinib/Actemra: Not indicated Antibiotics: Not indicated, patient is started on IV ceftriaxone and azithromycin.  Currently discontinued. DVT Prophylaxis: SCDs Start: 07/02/20 2034  Prone positioning and incentive spirometer use recommended.  Overall plan: No fever for now. Does not appear to be having an acute severe to moderate COVID-19 infection.  Monitor without any medication.  The treatment plan and use of medications and known side effects were discussed with patient/family. It was clearly explained that complete risks and long-term side effects are unknown. Patient/family agree with the treatment plan.    2.  Acute metabolic encephalopathy History of HIV dementia Ongoing. Confusion improving and appears to be baseline. Speech therapy consult currently pending for swallowing evaluation as the patient is not able to swallow medications safely. PT OT recommends transition back to ALF when stable. No further work-up for now  3. hypertensive urgency Likely contributing to patient's encephalopathy as well. Blood pressure elevated but currently below safe medications by mouth p.o. Continue current regimen.  Monitor.  4. HIV Continue Biktarvy  5. seizure history On Depakote production  does not appear to be having any acute seizures right now. Depakote switched to IV for now. Level is low.  6. CVA and HLD Currently no acute CVA based on the CT scan. Continue statin. Monitor.  7.  Mild hyperkalemia  from hemodialysis.  No work-up.  8. CKD stage IIIb. Renal function close to baseline. Continue to monitor. No further IV fluids.  9. hyperbilirubinemia, elevated AST. Secondary to COVID-19.  Monitor.  Body mass index is 19.79 kg/m.    Interventions:        Diet: N.p.o. for now. DVT Prophylaxis: Subcutaneous Heparin   SCDs Start: 07/02/20 2034    Advance goals of care discussion: Full code  Family Communication: no family was present at bedside, at the time of interview.  Discussed with daughter on the phone  opportunity was given to ask question and all questions were answered satisfactorily.   Disposition:  Status is: Inpatient  Remains inpatient appropriate because:IV treatments appropriate due to intensity of illness or inability to take PO  Dispo: The patient is from: ALF              Anticipated d/c is to: ALF              Anticipated d/c date is: 2 days              Patient currently is not medically stable to d/c.  Subjective: Unable to provide any review of system.  No nausea or vomiting.  No acute events overnight.  Unable to swallow safely per RN.  Physical Exam:  General: Appear in mild distress, no Rash; Oral Mucosa Clear, moist. no Abnormal Neck Mass Or lumps, Conjunctiva normal  Cardiovascular: S1 and S2 Present, no Murmur, Respiratory: good respiratory effort, Bilateral Air  entry present and CTA, no Crackles, no wheezes Abdomen: Bowel Sound present, Soft and no tenderness Extremities: no Pedal edema Neurology: alert and oriented to self only. affect flat in affect. no new focal deficit Gait not checked due to patient safety concerns  Vitals:   07/03/20 1230 07/03/20 1330 07/03/20 1400 07/03/20 1658  BP: (!) 156/98 (!) 155/96  (!) 142/92 (!) 179/100  Pulse: 81 82 73 74  Resp: 15 14 12 16   Temp:    99 F (37.2 C)  TempSrc:      SpO2: 99% 99% 98% 100%  Weight:      Height:       No intake or output data in the 24 hours ending 07/03/20 1922 Filed Weights   07/02/20 1954  Weight: 60.8 kg    Data Reviewed: I have personally reviewed and interpreted daily labs, tele strips, imaging. I reviewed all nursing notes, pharmacy notes, vitals, pertinent old records I have discussed plan of care as described above with RN and patient/family.  CBC: Recent Labs  Lab 07/02/20 1635 07/03/20 0415  WBC 8.2 5.9  NEUTROABS 6.0  --   HGB 14.1 12.7*  HCT 41.5 38.6*  MCV 95.8 96.0  PLT 122* 226*   Basic Metabolic Panel: Recent Labs  Lab 07/02/20 1635 07/03/20 0415  NA 141 138  K 4.4 5.6*  CL 106 105  CO2 24 22  GLUCOSE 101* 74  BUN 25* 23  CREATININE 2.86* 2.36*  CALCIUM 9.2 8.5*    Studies: CT HEAD WO CONTRAST  Result Date: 07/03/2020 CLINICAL DATA:  COVID positive. Altered mental status. No reported injury. EXAM: CT HEAD WITHOUT CONTRAST TECHNIQUE: Contiguous axial images were obtained from the base of the skull through the vertex without intravenous contrast. COMPARISON:  05/10/2019 head CT. FINDINGS: Brain: Chronic small right cerebellar hemisphere and right basal ganglia lacunes. No evidence of parenchymal hemorrhage or extra-axial fluid collection. No mass lesion, mass effect, or midline shift. No CT evidence of acute infarction. Nonspecific prominent subcortical and periventricular white matter hypodensity, most in keeping with chronic small vessel ischemic change. Cerebral volume is age appropriate. No ventriculomegaly. Vascular: No acute abnormality. Skull: No evidence of calvarial fracture. Sinuses/Orbits: Mild mucoperiosteal thickening throughout the ethmoidal air cells bilaterally. No fluid levels. Other:  The mastoid air cells are unopacified. IMPRESSION: 1. No evidence of acute intracranial  abnormality. 2. Prominent chronic small vessel ischemic changes in the cerebral white matter. 3. Small chronic right cerebellar hemisphere and right basal ganglia lacune is. 4. Mild chronic appearing paranasal sinusitis. Electronically Signed   By: Ilona Sorrel M.D.   On: 07/03/2020 08:49    Scheduled Meds: . bictegravir-emtricitabine-tenofovir AF  1 tablet Oral Daily  . nitroGLYCERIN  1 inch Topical Q6H  . sodium chloride flush  3 mL Intravenous Q12H   Continuous Infusions: . valproate sodium Stopped (07/03/20 1103)   PRN Meds: acetaminophen **OR** acetaminophen, labetalol, OLANZapine, polyethylene glycol  Time spent: 35 minutes  Author: Berle Mull, MD [facility] 07/03/2020 7:22 PM  To reach On-call, see care teams to locate the attending and reach out via [email or web address]. Between 7PM-7AM, please contact night-coverage If you still have difficulty reaching the attending provider, please page the [facility] (Director on Call) for [facility] on amion for assistance.

## 2020-07-03 NOTE — ED Notes (Signed)
First contact. Change of shift. Pt alert and oriented only to name and loaction. Attempted reorientation to time and event. No acute distressed expressed. Will continue to monitor.

## 2020-07-03 NOTE — Progress Notes (Signed)
Occupational Therapy Evaluation Patient Details Name: Peter Cox MRN: [id number] DOB: 1960-10-13 Today's Date: 07/03/2020    History of Present Illness 60 year old male with history of cocaine abuse, alcohol abuse, HIV on antiretroviral, early dementia, stroke comes in with chief complaint of chills.  Patient resides at [facility] assisted living. Not vaccinated. Covid +.   Clinical Impression   PTA pt lives at [facility] where they assist him with ADL and IADL tasks as needed. Pt able to ambulate with RW @ S level with SpO2 @ 97; HR 92 with activity without any increase in WOB. BP 156/90. Feel pt close to his baseline functionally and safe to DC back to ALF when medically stable. Will follow acutely to facilitate safe DC.     Follow Up Recommendations  No OT follow up;Supervision - Intermittent (return to ALF)    Equipment Recommendations  Other (comment) (RW)    Recommendations for Other Services       Precautions / Restrictions Precautions Precautions: Fall      Mobility Bed Mobility Overal bed mobility: Needs Assistance Bed Mobility: Supine to Sit;Sit to Supine     Supine to sit: Min guard Sit to supine: Min assist   General bed mobility comments: due to being on a stretcher    Transfers Overall transfer level: Needs assistance   Transfers: Sit to/from Stand Sit to Stand: Min guard         General transfer comment: progressed to S with mobility @ RW level    Balance Overall balance assessment: Needs assistance   Sitting balance-Leahy Scale: Good       Standing balance-Leahy Scale: Poor                             ADL either performed or assessed with clinical judgement   ADL Overall ADL's : Needs assistance/impaired Eating/Feeding: Modified independent   Grooming: Supervision/safety;Standing   Upper Body Bathing: Set up;Sitting   Lower Body Bathing: Supervison/ safety;Set up;Sit to/from stand   Upper Body Dressing :  Set up;Sitting   Lower Body Dressing: Set up;Supervision/safety;Sit to/from stand   Toilet Transfer: Min guard;Ambulation   Toileting- Clothing Manipulation and Hygiene: Supervision/safety;Sit to/from stand       Functional mobility during ADLs: Min guard;Rolling walker;Cueing for safety       Vision         Perception     Praxis      Pertinent Vitals/Pain Pain Assessment: No/denies pain     Hand Dominance Right   Extremity/Trunk Assessment Upper Extremity Assessment Upper Extremity Assessment: Generalized weakness   Lower Extremity Assessment Lower Extremity Assessment: Defer to PT evaluation   Cervical / Trunk Assessment Cervical / Trunk Assessment: Normal   Communication Communication Communication: No difficulties   Cognition Arousal/Alertness: Awake/alert Behavior During Therapy: Flat affect Overall Cognitive Status: History of cognitive impairments - at baseline                                 General Comments: most likely at baseline   General Comments       Exercises     Shoulder Instructions      Home Living Family/patient expects to be discharged to:: Assisted living  Prior Functioning/Environment Level of Independence: Independent with assistive device(s);Needs assistance    ADL's / Homemaking Assistance Needed: gets assistance from staff with ADL tasks as needed Communication / Swallowing Assistance Needed: soft voice; difficult to hear at times          OT Problem List: Decreased activity tolerance      OT Treatment/Interventions:      OT Goals(Current goals can be found in the care plan section) Acute Rehab OT Goals Patient Stated Goal: none stated OT Goal Formulation: All assessment and education complete, DC therapy  OT Frequency:     Barriers to D/C:            Co-evaluation PT/OT/SLP Co-Evaluation/Treatment: Yes Reason for Co-Treatment: For  patient/therapist safety   OT goals addressed during session: ADL's and self-care      AM-PAC OT "6 Clicks" Daily Activity     Outcome Measure Help from another person eating meals?: None Help from another person taking care of personal grooming?: A Little Help from another person toileting, which includes using toliet, bedpan, or urinal?: A Little Help from another person bathing (including washing, rinsing, drying)?: A Little Help from another person to put on and taking off regular upper body clothing?: A Little Help from another person to put on and taking off regular lower body clothing?: A Little 6 Click Score: 19   End of Session Equipment Utilized During Treatment: Gait belt;Rolling walker Nurse Communication: Mobility status;Other (comment) (DC needs)  Activity Tolerance: Patient tolerated treatment well Patient left: in bed;with call bell/phone within reach  OT Visit Diagnosis: Unsteadiness on feet (R26.81);Other abnormalities of gait and mobility (R26.89)                Time: 1062-6948 OT Time Calculation (min): 20 min Charges:  OT General Charges $OT Visit: 1 Visit OT Evaluation $OT Eval Low Complexity: [place], OT/L   Acute OT Clinical Specialist Acute Rehabilitation Services Pager [phone] Office [phone]   [facility] 07/03/2020, 1:42 PM

## 2020-07-03 NOTE — ED Notes (Signed)
RN attempted to give pt divalproex. This RN placed 1 of 2 tablets in pt's mouth and offered pt a sip of water. Pt held tablet and water in his mouth for over one minute, not swallowing. This RN asked pt if he is able to swallow, to which he nodded "yes," but continued to hold medication in his mouth, despite nodding "yes," and being instructed by this RN to swallow med. After pt continued to hold tablet and water in his mouth without swallowing, this RN instructed pt to spit the tablet and water out, to avoid choking hazard.

## 2020-07-03 NOTE — Progress Notes (Signed)
07/03/20 1650  PT Evaluation Information  Last PT Received On 07/03/20  Assistance Needed +1  PT/OT/SLP Co-Evaluation/Treatment Yes  Reason for Co-Treatment For patient/therapist safety  PT goals addressed during session Mobility/safety with mobility;Balance;Proper use of DME  History of Present Illness 60 year old male with history of cocaine abuse, alcohol abuse, HIV on antiretroviral, early dementia, stroke comes in with chief complaint of chills and AMS.  Patient resides at [facility] assisted living. Not vaccinated. Covid +.  Precautions  Precautions Fall  Restrictions  Weight Bearing Restrictions No  Home Living  Family/patient expects to be discharged to: Assisted living  [place] [facility]  Prior Function  Level of Independence Independent with assistive device(s);Needs assistance  Gait / Transfers Assistance Needed Reports he uses walker, then reports he uses cane. Cane in room during session  ADL's / Homemaking Assistance Needed gets assistance from staff with ADL tasks as needed  Communication / Swallowing Assistance Needed soft voice; difficult to hear at times  Communication  Communication Expressive difficulties  Pain Assessment  Pain Assessment No/denies pain  Cognition  Arousal/Alertness Awake/alert  Behavior During Therapy Flat affect  Overall Cognitive Status History of cognitive impairments - at baseline  General Comments most likely at baseline. Reporting conflicting information throughout.  Upper Extremity Assessment  Upper Extremity Assessment Defer to OT evaluation  Lower Extremity Assessment  Lower Extremity Assessment Generalized weakness  Cervical / Trunk Assessment  Cervical / Trunk Assessment Normal  Bed Mobility  Overal bed mobility Needs Assistance  Bed Mobility Supine to Sit;Sit to Supine  Supine to sit Min guard  Sit to supine Min assist  General bed mobility comments Min A for trunk assist and LE assist.  Transfers   Overall transfer level Needs assistance  Transfers Sit to/from Stand  Sit to Stand Min guard  General transfer comment Min guard for safety. Cues for hand placement.  Ambulation/Gait  Ambulation/Gait assistance Min guard;Supervision  Gait Distance (Feet) 15 Feet  Assistive device Rolling walker (2 wheeled)  Gait Pattern/deviations Step-through pattern;Decreased stride length  General Gait Details Initially unsteady, however, balance improved with distance. Ambulated within the room. Progressed to supervision level. Cues for sequencing using RW.  Gait velocity Decreased  Balance  Overall balance assessment Needs assistance  Sitting-balance support No upper extremity supported  Sitting balance-Leahy Scale Good  Standing balance support Bilateral upper extremity supported  Standing balance-Leahy Scale Poor  Standing balance comment reliant on BUE support  PT - End of Session  Equipment Utilized During Treatment Gait belt  Activity Tolerance Patient tolerated treatment well  Patient left in bed;with call bell/phone within reach (on stretcher in ED)  Nurse Communication Mobility status  PT Assessment  PT Recommendation/Assessment Patient needs continued PT services  PT Visit Diagnosis Unsteadiness on feet (R26.81);Muscle weakness (generalized) (M62.81)  PT Problem List Decreased strength;Decreased balance;Decreased mobility;Decreased cognition;Decreased safety awareness;Decreased knowledge of use of DME  PT Plan  PT Frequency (ACUTE ONLY) Min 3X/week  PT Treatment/Interventions (ACUTE ONLY) Gait training;DME instruction;Therapeutic exercise;Functional mobility training;Stair training;Therapeutic activities;Balance training;Patient/family education  AM-PAC PT "6 Clicks" Mobility Outcome Measure (Version 2)  Help needed turning from your back to your side while in a flat bed without using bedrails? 3  Help needed moving from lying on your back to sitting on the side of a flat bed without  using bedrails? 3  Help needed moving to and from a bed to a chair (including a wheelchair)? 3  Help needed standing up from a chair  using your arms (e.g., wheelchair or bedside chair)? 3  Help needed to walk in hospital room? 3  Help needed climbing 3-5 steps with a railing?  2  6 Click Score 17  Consider Recommendation of Discharge To: Home with [facility]  PT Recommendation  Follow Up Recommendations Home health PT (return to ALF)  PT equipment Rolling walker with 5" wheels  Individuals Consulted  Consulted and Agree with Results and Recommendations Patient  Acute Rehab PT Goals  Patient Stated Goal none stated  PT Goal Formulation With patient  Time For Goal Achievement 07/17/20  Potential to Achieve Goals Fair  PT Time Calculation  PT Start Time (ACUTE ONLY) 1229  PT Stop Time (ACUTE ONLY) 1249  PT Time Calculation (min) (ACUTE ONLY) 20 min  PT General Charges  $$ ACUTE PT VISIT 1 Visit  PT Evaluation  $PT Eval Low Complexity 1 Low  Written Expression  Dominant Hand Right   Pt admitted secondary to problem above with deficits above. Pt mildly unsteady initially, however, progressed to a supervision level by end of session. Feel pt is likely close to baseline. Recommending HHPT follow up at [facility] ALF and RW for increased safety. Will continue to follow acutely.   Reuel Derby, PT, DPT  Acute Rehabilitation Services  Pager: [phone] Office: [phone]

## 2020-07-03 NOTE — ED Notes (Addendum)
RN messaged MD, Dr. Jeannetta Nap, per pt's increasing HTN

## 2020-07-03 NOTE — ED Notes (Signed)
Report to Oliver Hum, RN

## 2020-07-03 NOTE — ED Notes (Addendum)
Attempted to administer PO medication, however, patient unable to successfully swallow any PO medication. MD Posey Pronto notified.

## 2020-07-04 DIAGNOSIS — U071 COVID-19: Secondary | ICD-10-CM | POA: Diagnosis not present

## 2020-07-04 LAB — URINE CULTURE

## 2020-07-04 MED ORDER — LABETALOL HCL 100 MG PO TABS
100.0000 mg | ORAL_TABLET | Freq: Three times a day (TID) | ORAL | Status: DC
  Administered 2020-07-04 – 2020-07-07 (×7): 100 mg via ORAL
  Filled 2020-07-04 (×8): qty 1

## 2020-07-04 MED ORDER — HYDRALAZINE HCL 25 MG PO TABS
25.0000 mg | ORAL_TABLET | Freq: Three times a day (TID) | ORAL | Status: DC
  Administered 2020-07-04 – 2020-07-07 (×7): 25 mg via ORAL
  Filled 2020-07-04 (×8): qty 1

## 2020-07-04 MED ORDER — AMLODIPINE BESYLATE 10 MG PO TABS
10.0000 mg | ORAL_TABLET | Freq: Every day | ORAL | Status: DC
  Administered 2020-07-04 – 2020-07-07 (×4): 10 mg via ORAL
  Filled 2020-07-04 (×4): qty 1

## 2020-07-04 NOTE — Progress Notes (Signed)
[facility] Progress Note  Patient: Peter Cox    HEN:[id number]  DOA: 07/02/2020     Date of Service: the patient was seen and examined on 07/04/2020  Brief hospital course: Past medical history of CKD 3B, HIV dementia, HTN, HLD, CVA, deconditioning dependency for ADL, incontinence, history of alcohol use.  From ALF. Presents with confusion and fever and chills.  Found to have COVID-19 pneumonia. Currently plan is monitor for improvement in oral intake.  Assessment and Plan: 1. Acute COVID-19 Viral Pneumonia CXR: No evidence of hazy bilateral peripheral opacities Oxygen requirement: Room air right now Remdesivir: Not indicated for now. Steroids: Not indicated Baricitinib/Actemra: Not indicated Antibiotics: Not indicated, patient is started on IV ceftriaxone and azithromycin.  Currently discontinued. DVT Prophylaxis: SCDs Start: 07/02/20 2034  Prone positioning and incentive spirometer use recommended.  Overall plan: No fever for now. Does not appear to be having an acute severe to moderate COVID-19 infection.  Monitor without any medication.  The treatment plan and use of medications and known side effects were discussed with patient/family. It was clearly explained that complete risks and long-term side effects are unknown. Patient/family agree with the treatment plan.    2.  Acute metabolic encephalopathy History of HIV dementia Ongoing. Confusion improving and appears to be baseline. Speech therapy consult recommending dysphagia 1 diet for now. PT OT recommends transition back to ALF when stable. No further work-up for now  3. hypertensive urgency Likely contributing to patient's encephalopathy as well. Initiating p.o. medication as well.  4. HIV Continue Biktarvy  5. seizure history On Depakote production does not appear to be having any acute seizures right now. Depakote switched to IV for now. Level is low.  6. CVA and HLD Currently no acute CVA based  on the CT scan. Continue statin. Monitor.  7.  Mild hyperkalemia  from hemodialysis.  No work-up.  8. CKD stage IIIb. Renal function close to baseline. Continue to monitor. No further IV fluids.  9. hyperbilirubinemia, elevated AST. Secondary to COVID-19.  Monitor.  Body mass index is 19.79 kg/m.    Interventions:        Diet: Dysphagia 1 diet DVT Prophylaxis: Subcutaneous Heparin   SCDs Start: 07/02/20 2034    Advance goals of care discussion: Full code  Family Communication: no family was present at bedside, at the time of interview.  Discussed with daughter on the phone  opportunity was given to ask question and all questions were answered satisfactorily.   Disposition:  Status is: Inpatient  Remains inpatient appropriate because:IV treatments appropriate due to intensity of illness or inability to take PO  Dispo: The patient is from: ALF              Anticipated d/c is to: ALF              Anticipated d/c date is: 2 days              Patient currently is not medically stable to d/c.  Subjective: Awake.  No nausea no vomiting blood no fever no chills.  Able to follow commands.  No acute events overnight.  Physical Exam:  General: Appear in mild distress, no Rash; Oral Mucosa Clear, moist. no Abnormal Neck Mass Or lumps, Conjunctiva normal  Cardiovascular: S1 and S2 Present, no Murmur, Respiratory: normal respiratory effort, Bilateral Air entry present and no Crackles, no wheezes Abdomen: Bowel Sound present, Soft and no tenderness Extremities: trace Pedal edema Neurology: alert and oriented to time, place,  and person affect appropriate. no new focal deficit Gait not checked due to patient safety concerns    Vitals:   07/03/20 1400 07/03/20 1658 07/03/20 2231 07/04/20 1405  BP: (!) 142/92 (!) 179/100 (!) 148/78 (!) 166/109  Pulse: 73 74 70 75  Resp: 12 16 18    Temp:  99 F (37.2 C) 99.2 F (37.3 C) 98.9 F (37.2 C)  TempSrc:   Oral   SpO2: 98% 100%  96% 100%  Weight:      Height:        Intake/Output Summary (Last 24 hours) at 07/04/2020 1756 Last data filed at 07/04/2020 1600 Gross per 24 hour  Intake 107.63 ml  Output -  Net 107.63 ml   Filed Weights   07/02/20 1954  Weight: 60.8 kg    Data Reviewed: I have personally reviewed and interpreted daily labs, tele strips, imaging. I reviewed all nursing notes, pharmacy notes, vitals, pertinent old records I have discussed plan of care as described above with RN and patient/family.  CBC: Recent Labs  Lab 07/02/20 1635 07/03/20 0415  WBC 8.2 5.9  NEUTROABS 6.0  --   HGB 14.1 12.7*  HCT 41.5 38.6*  MCV 95.8 96.0  PLT 122* 751*   Basic Metabolic Panel: Recent Labs  Lab 07/02/20 1635 07/03/20 0415  NA 141 138  K 4.4 5.6*  CL 106 105  CO2 24 22  GLUCOSE 101* 74  BUN 25* 23  CREATININE 2.86* 2.36*  CALCIUM 9.2 8.5*    Studies: No results found.  Scheduled Meds: . bictegravir-emtricitabine-tenofovir AF  1 tablet Oral Daily  . nitroGLYCERIN  1 inch Topical Q6H  . sodium chloride flush  3 mL Intravenous Q12H   Continuous Infusions: . valproate sodium 250 mg (07/04/20 1106)   PRN Meds: acetaminophen **OR** acetaminophen, labetalol, OLANZapine, polyethylene glycol  Time spent: 35 minutes  Author: Berle Mull, MD [facility] 07/04/2020 5:56 PM  To reach On-call, see care teams to locate the attending and reach out via [email or web address]. Between 7PM-7AM, please contact night-coverage If you still have difficulty reaching the attending provider, please page the [facility] (Director on Call) for [facility] on amion for assistance.

## 2020-07-04 NOTE — Evaluation (Signed)
Clinical/Bedside Swallow Evaluation Patient Details  Name: Peter Cox MRN: [id number] Date of Birth: 15-Oct-1960  Today's Date: 07/04/2020 Time: SLP Start Time (ACUTE ONLY): 1250 SLP Stop Time (ACUTE ONLY): 1305 SLP Time Calculation (min) (ACUTE ONLY): 15 min  Past Medical History:  Past Medical History:  Diagnosis Date  . Alcohol abuse    heavy, clean since April  . Alcohol abuse 11/24/2014  . Cocaine abuse ([place])     clean since about 2000  . Drug abuse ([place]) 11/24/2014  . HIV infection ([place])    dx 08/08/2008 (unknown how he was exposed)  Initial VL 43,200 and CD4 410 on 08/28/08  . Hyperlipidemia   . Hypertension   . Marijuana abuse   . Mild cognitive impairment    likely vascular dementia/psa/?HIV componentCT Head 02/12/08 for htn crisis and concern for stroke: impression-1. Old right internal capsule and thalamic lacunar infarcts. 2. Mild to moderate chronic small vessell white matter ischemic changes in both cerebral hemisphere, greater on the right  . Neuromuscular disorder ([place])   . Seizures ([place])    "last year had seizure"  . Stroke ([place])   . Tobacco abuse    Past Surgical History:  Past Surgical History:  Procedure Laterality Date  . CLOSED REDUCTION PATELLAR     right knee  . HEMORRHOID SURGERY    . SHOULDER ARTHROSCOPY  01/2008   w/extensive debridement (Dr Mardelle Matte)   HPI:  Peter Cox is a 60 y.o. male with medical history significant of alcohol use, CKD 3, HIV dementia, HIV, hypertension, hyperlipidemia, history of CVA, physical deconditioning, urinary incontinence.  Pt with COVID-19 PNA.  Head CT negative for acute changes.    Assessment / Plan / Recommendation Clinical Impression  Pt was seen for a bedside swallow evaluation in the setting of COVID-19 PNA.  Pt was encountered awake/alert and he was agreeable to this evaluation.  Pt was very pleasant, but notably confused throughout this evaluation.  Difficult to determine acute AMS vs baseline dementia given  that the pt's family was not present for this evaluation.  Oral mechanism examination revealed missing dentition, suspected lingual weakness, lingual tremors upon protrusion, and reduced lingual ROM.  Pt was seen with trials of ice chips, thin liquid (tsp/straw), puree, and regular solids.  He tolerated ice chip and thin liquid trials without difficulty.  During puree trials, pt was noted to have intermittent bolus holding and suspected prolonged AP transport.  He was noted to be impulsive during regular solid trials, attempting to place a whole cracker in his mouth.  Mastication was prolonged and pt was observed to have mild oral residue following swallow initiation.  Residue was cleared by a cued liquid wash.  No clinical s/sx of aspiration were observed with any PO trials on this date.  Recommend initiation of Dysphagia 1 (puree) solids and thin liquids with medications administered crushed in puree.  Hopeful that pt's tolerance of more complex solids will increase as mentation improves.  SLP will f/u to monitor diet tolerance and to determine appropriateness for diet upgrade.   SLP Visit Diagnosis: Dysphagia, oral phase (R13.11)    Aspiration Risk  Mild aspiration risk    Diet Recommendation Dysphagia 1 (Puree);Thin liquid   Liquid Administration via: Cup;Straw Medication Administration: Crushed with puree Supervision: Full supervision/cueing for compensatory strategies;Staff to assist with self feeding Compensations: Minimize environmental distractions;Slow rate;Small sips/bites    Other  Recommendations Oral Care Recommendations: Oral care BID;Staff/trained caregiver to provide oral care   Follow up  Recommendations Skilled Nursing facility;24 hour supervision/assistance      Frequency and Duration min 2x/week  2 weeks       Prognosis Prognosis for Safe Diet Advancement: Fair Barriers to Reach Goals: Cognitive deficits      Swallow Study   General HPI: Peter Cox is a 60 y.o.  male with medical history significant of alcohol use, CKD 3, HIV dementia, HIV, hypertension, hyperlipidemia, history of CVA, physical deconditioning, urinary incontinence.  Pt with COVID-19 PNA.  Head CT negative for acute changes Type of Study: Bedside Swallow Evaluation Previous Swallow Assessment: None Diet Prior to this Study: NPO Temperature Spikes Noted: Yes Respiratory Status: Room air History of Recent Intubation: No Behavior/Cognition: Alert;Cooperative;Confused;Pleasant mood Oral Cavity Assessment: Dry Oral Care Completed by SLP: No Oral Cavity - Dentition: Missing dentition Vision: Functional for self-feeding Self-Feeding Abilities: Needs set up Patient Positioning: Partially reclined Baseline Vocal Quality: Normal Volitional Cough: Cognitively unable to elicit Volitional Swallow: Unable to elicit    Oral/Motor/Sensory Function Overall Oral Motor/Sensory Function: Generalized oral weakness Facial ROM: Within Functional Limits Facial Symmetry: Within Functional Limits Lingual ROM: Reduced right;Reduced left Lingual Symmetry: Within Functional Limits Lingual Strength: Other (Comment) (tremors noted upon protrusion)   Ice Chips Ice chips: Within functional limits Presentation: Spoon   Thin Liquid Thin Liquid: Within functional limits Presentation: Spoon;Straw    Nectar Thick Nectar Thick Liquid: Not tested   Honey Thick Honey Thick Liquid: Not tested   Puree Puree: Impaired Presentation: Spoon Oral Phase Functional Implications: Prolonged oral transit   Solid     Solid: Impaired Presentation: Self Fed Oral Phase Impairments: Impaired mastication Oral Phase Functional Implications: Impaired mastication;Prolonged oral transit;Oral residue     Peter Cox M.S., CCC-SLP Acute Rehabilitation Services Office: [phone]  Peter Cox 07/04/2020,1:21 PM

## 2020-07-05 DIAGNOSIS — U071 COVID-19: Secondary | ICD-10-CM | POA: Diagnosis not present

## 2020-07-05 LAB — CBC WITH DIFFERENTIAL/PLATELET
Abs Immature Granulocytes: 0.01 10*3/uL (ref 0.00–0.07)
Basophils Absolute: 0 10*3/uL (ref 0.0–0.1)
Basophils Relative: 0 %
Eosinophils Absolute: 0 10*3/uL (ref 0.0–0.5)
Eosinophils Relative: 1 %
HCT: 38.3 % — ABNORMAL LOW (ref 39.0–52.0)
Hemoglobin: 12.7 g/dL — ABNORMAL LOW (ref 13.0–17.0)
Immature Granulocytes: 0 %
Lymphocytes Relative: 22 %
Lymphs Abs: 0.7 10*3/uL (ref 0.7–4.0)
MCH: 31.5 pg (ref 26.0–34.0)
MCHC: 33.2 g/dL (ref 30.0–36.0)
MCV: 95 fL (ref 80.0–100.0)
Monocytes Absolute: 0.4 10*3/uL (ref 0.1–1.0)
Monocytes Relative: 14 %
Neutro Abs: 2.1 10*3/uL (ref 1.7–7.7)
Neutrophils Relative %: 63 %
Platelets: 104 10*3/uL — ABNORMAL LOW (ref 150–400)
RBC: 4.03 MIL/uL — ABNORMAL LOW (ref 4.22–5.81)
RDW: 12.8 % (ref 11.5–15.5)
WBC: 3.3 10*3/uL — ABNORMAL LOW (ref 4.0–10.5)
nRBC: 0 % (ref 0.0–0.2)

## 2020-07-05 LAB — COMPREHENSIVE METABOLIC PANEL
ALT: 14 U/L (ref 0–44)
AST: 18 U/L (ref 15–41)
Albumin: 3.4 g/dL — ABNORMAL LOW (ref 3.5–5.0)
Alkaline Phosphatase: 56 U/L (ref 38–126)
Anion gap: 12 (ref 5–15)
BUN: 32 mg/dL — ABNORMAL HIGH (ref 8–23)
CO2: 22 mmol/L (ref 22–32)
Calcium: 8.5 mg/dL — ABNORMAL LOW (ref 8.9–10.3)
Chloride: 107 mmol/L (ref 98–111)
Creatinine, Ser: 2.39 mg/dL — ABNORMAL HIGH (ref 0.61–1.24)
GFR, Estimated: 30 mL/min — ABNORMAL LOW (ref >60)
Glucose, Bld: 96 mg/dL (ref 70–99)
Potassium: 4.1 mmol/L (ref 3.5–5.1)
Sodium: 141 mmol/L (ref 135–145)
Total Bilirubin: 0.6 mg/dL (ref 0.3–1.2)
Total Protein: 6.5 g/dL (ref 6.5–8.1)

## 2020-07-05 LAB — VALPROIC ACID LEVEL: Valproic Acid Lvl: 39 ug/mL — ABNORMAL LOW (ref 50.0–100.0)

## 2020-07-05 LAB — D-DIMER, QUANTITATIVE: D-Dimer, Quant: 0.44 ug/mL-FEU (ref 0.00–0.50)

## 2020-07-05 LAB — MAGNESIUM: Magnesium: 2 mg/dL (ref 1.7–2.4)

## 2020-07-05 LAB — C-REACTIVE PROTEIN: CRP: 4.9 mg/dL — ABNORMAL HIGH (ref <1.0)

## 2020-07-05 MED ORDER — ENSURE ENLIVE PO LIQD
237.0000 mL | Freq: Two times a day (BID) | ORAL | Status: DC
  Administered 2020-07-05 – 2020-07-07 (×4): 237 mL via ORAL

## 2020-07-05 MED ORDER — DIVALPROEX SODIUM 250 MG PO DR TAB
250.0000 mg | DELAYED_RELEASE_TABLET | Freq: Two times a day (BID) | ORAL | Status: DC
  Administered 2020-07-05 – 2020-07-10 (×9): 250 mg via ORAL
  Filled 2020-07-05 (×12): qty 1

## 2020-07-05 MED ORDER — DONEPEZIL HCL 10 MG PO TABS
10.0000 mg | ORAL_TABLET | Freq: Every day | ORAL | Status: DC
  Administered 2020-07-05 – 2020-07-07 (×3): 10 mg via ORAL
  Filled 2020-07-05 (×3): qty 1

## 2020-07-05 MED ORDER — ATORVASTATIN CALCIUM 10 MG PO TABS
20.0000 mg | ORAL_TABLET | Freq: Every day | ORAL | Status: DC
Start: 2020-07-05 — End: 2020-07-07
  Administered 2020-07-05 – 2020-07-07 (×3): 20 mg via ORAL
  Filled 2020-07-05 (×3): qty 2

## 2020-07-05 NOTE — Progress Notes (Signed)
[facility] Progress Note  Patient: Peter Cox    TUU:[id number]  DOA: 07/02/2020     Date of Service: the patient was seen and examined on 07/05/2020  Brief hospital course: Past medical history of CKD 3B, HIV dementia, HTN, HLD, CVA, deconditioning dependency for ADL, incontinence, history of alcohol use. From ALF. Presents with confusion and fever and chills.  Found to have COVID-19 pneumonia. Currently plan is monitor for improvement in oral intake.  Assessment and Plan: 1. Acute COVID-19 Viral Pneumonia CXR: No evidence of hazy bilateral peripheral opacities Oxygen requirement: Room air right now Remdesivir: Not indicated for now. Steroids: Not indicated Baricitinib/Actemra: Not indicated Antibiotics: Not indicated, patient is started on IV ceftriaxone and azithromycin.  Currently discontinued. DVT Prophylaxis: SCDs Start: 07/02/20 2034  Prone positioning and incentive spirometer use recommended.  Overall plan: Mild asymptomatic infection.  No acute intervention right now.  The treatment plan and use of medications and known side effects were discussed with patient/family. It was clearly explained that complete risks and long-term side effects are unknown. Patient/family agree with the treatment plan.    2.  Acute metabolic encephalopathy History of HIV dementia Dysphagia Ongoing. Confusion improving and appears to be baseline. Speech therapy consult recommending dysphagia 1 diet for now. PT OT recommends transition back to ALF when stable. No further work-up for now Still remains confused with minimal oral intake. Monitor for p.o. intake improvement.  3. Hypertensive urgency Likely contributing to patient's encephalopathy as well. Initiating p.o. medication as well.  4. HIV Continue Biktarvy  5. Seizure history On Depakote production does not appear to be having any acute seizures right now. Depakote was switched to IV and dose was increased. Recheck  Depakote level and switch to p.o. better  6. CVA and HLD Currently no acute CVA based on the CT scan. Continue statin. Monitor.  7.  Mild hyperkalemia  from hemodialysis.  Resolved.  8. CKD stage IIIb. Renal function stable and close to baseline. Continue to monitor. No further IV fluids.  9. hyperbilirubinemia, elevated AST. Secondary to COVID-19.  Monitor.  Diet: Dysphagia 1 diet DVT Prophylaxis: Subcutaneous Heparin   SCDs Start: 07/02/20 2034    Advance goals of care discussion: Full code  Family Communication: no family was present at bedside, at the time of interview.  Discussed with daughter on the phone. opportunity was given to ask question and all questions were answered satisfactorily.   Disposition:  Status is: Inpatient  Remains inpatient appropriate because:IV treatments appropriate due to intensity of illness or inability to take PO  Dispo: The patient is from: ALF              Anticipated d/c is to: ALF              Anticipated d/c date is: 2 days              Patient currently is not medically stable to d/c.  Subjective: no Nausea and vomiting.  minimal intake.  No acute events overnight.  Physical Exam:  General: Appear in mild distress, no Rash; Oral Mucosa Clear, moist. no Abnormal Neck Mass Or lumps, Conjunctiva normal  Cardiovascular: S1 and S2 Present, no Murmur, Respiratory: normal respiratory effort, Bilateral Air entry present and no Crackles, no wheezes Abdomen: Bowel Sound present, Soft and no tenderness Extremities: trace Pedal edema Neurology: alert and not oriented to time, place, and person affect appropriate. no new focal deficit Gait not checked due to patient safety concerns  Vitals:  07/04/20 1827 07/04/20 2144 07/05/20 0544 07/05/20 0918  BP: (!) 196/105 (!) 147/110 (!) 139/97 (!) 146/88  Pulse: 71 85 89 79  Resp:    14  Temp: 98.6 F (37 C)   99.3 F (37.4 C)  TempSrc: Oral   Oral  SpO2: 97% 100% 100%   Weight:       Height:        Intake/Output Summary (Last 24 hours) at 07/05/2020 1305 Last data filed at 07/05/2020 2202 Gross per 24 hour  Intake 160.63 ml  Output 1350 ml  Net -1189.37 ml   Filed Weights   07/02/20 1954  Weight: 60.8 kg    Data Reviewed: I have personally reviewed and interpreted daily labs, tele strips, imaging. I reviewed all nursing notes, pharmacy notes, vitals, pertinent old records I have discussed plan of care as described above with RN and patient/family.  CBC: Recent Labs  Lab 07/02/20 1635 07/03/20 0415 07/05/20 0708  WBC 8.2 5.9 3.3*  NEUTROABS 6.0  --  2.1  HGB 14.1 12.7* 12.7*  HCT 41.5 38.6* 38.3*  MCV 95.8 96.0 95.0  PLT 122* 109* 542*   Basic Metabolic Panel: Recent Labs  Lab 07/02/20 1635 07/03/20 0415 07/05/20 0708  NA 141 138 141  K 4.4 5.6* 4.1  CL 106 105 107  CO2 24 22 22   GLUCOSE 101* 74 96  BUN 25* 23 32*  CREATININE 2.86* 2.36* 2.39*  CALCIUM 9.2 8.5* 8.5*  MG  --   --  2.0    Studies: No results found.  Scheduled Meds: . amLODipine  10 mg Oral Daily  . atorvastatin  20 mg Oral Daily  . bictegravir-emtricitabine-tenofovir AF  1 tablet Oral Daily  . divalproex  250 mg Oral BID  . donepezil  10 mg Oral Daily  . hydrALAZINE  25 mg Oral Q8H  . labetalol  100 mg Oral TID  . nitroGLYCERIN  1 inch Topical Q6H  . sodium chloride flush  3 mL Intravenous Q12H   Continuous Infusions:  PRN Meds: acetaminophen **OR** acetaminophen, labetalol, OLANZapine, polyethylene glycol  Time spent: 35 minutes  Author: Berle Mull, MD [facility] 07/05/2020 1:05 PM  To reach On-call, see care teams to locate the attending and reach out via [email or web address]. Between 7PM-7AM, please contact night-coverage If you still have difficulty reaching the attending provider, please page the [facility] (Director on Call) for [facility] on amion for assistance.

## 2020-07-05 NOTE — Plan of Care (Signed)

## 2020-07-06 DIAGNOSIS — U071 COVID-19: Secondary | ICD-10-CM | POA: Diagnosis not present

## 2020-07-06 LAB — COMPREHENSIVE METABOLIC PANEL
ALT: 15 U/L (ref 0–44)
AST: 18 U/L (ref 15–41)
Albumin: 3.5 g/dL (ref 3.5–5.0)
Alkaline Phosphatase: 55 U/L (ref 38–126)
Anion gap: 13 (ref 5–15)
BUN: 35 mg/dL — ABNORMAL HIGH (ref 8–23)
CO2: 23 mmol/L (ref 22–32)
Calcium: 8.9 mg/dL (ref 8.9–10.3)
Chloride: 107 mmol/L (ref 98–111)
Creatinine, Ser: 2.76 mg/dL — ABNORMAL HIGH (ref 0.61–1.24)
GFR, Estimated: 25 mL/min — ABNORMAL LOW (ref >60)
Glucose, Bld: 90 mg/dL (ref 70–99)
Potassium: 4 mmol/L (ref 3.5–5.1)
Sodium: 143 mmol/L (ref 135–145)
Total Bilirubin: 0.8 mg/dL (ref 0.3–1.2)
Total Protein: 7 g/dL (ref 6.5–8.1)

## 2020-07-06 LAB — PROCALCITONIN: Procalcitonin: 0.59 ng/mL

## 2020-07-06 LAB — CBC WITH DIFFERENTIAL/PLATELET
Abs Immature Granulocytes: 0.01 10*3/uL (ref 0.00–0.07)
Basophils Absolute: 0 10*3/uL (ref 0.0–0.1)
Basophils Relative: 0 %
Eosinophils Absolute: 0 10*3/uL (ref 0.0–0.5)
Eosinophils Relative: 0 %
HCT: 41.3 % (ref 39.0–52.0)
Hemoglobin: 13.7 g/dL (ref 13.0–17.0)
Immature Granulocytes: 0 %
Lymphocytes Relative: 34 %
Lymphs Abs: 0.8 10*3/uL (ref 0.7–4.0)
MCH: 31.6 pg (ref 26.0–34.0)
MCHC: 33.2 g/dL (ref 30.0–36.0)
MCV: 95.4 fL (ref 80.0–100.0)
Monocytes Absolute: 0.5 10*3/uL (ref 0.1–1.0)
Monocytes Relative: 18 %
Neutro Abs: 1.2 10*3/uL — ABNORMAL LOW (ref 1.7–7.7)
Neutrophils Relative %: 48 %
Platelets: 97 10*3/uL — ABNORMAL LOW (ref 150–400)
RBC: 4.33 MIL/uL (ref 4.22–5.81)
RDW: 13 % (ref 11.5–15.5)
WBC: 2.5 10*3/uL — ABNORMAL LOW (ref 4.0–10.5)
nRBC: 0 % (ref 0.0–0.2)

## 2020-07-06 LAB — D-DIMER, QUANTITATIVE: D-Dimer, Quant: 0.45 ug/mL-FEU (ref 0.00–0.50)

## 2020-07-06 LAB — C-REACTIVE PROTEIN: CRP: 4.6 mg/dL — ABNORMAL HIGH (ref <1.0)

## 2020-07-06 NOTE — Progress Notes (Signed)
  Speech Language Pathology Treatment: Dysphagia  Patient Details Name: Peter Cox MRN: [id number] DOB: [birth year] Today's Date: 07/06/2020 Time: 7846-9629 SLP Time Calculation (min) (ACUTE ONLY): 18 min  Assessment / Plan / Recommendation Clinical Impression  Pt was seen for dysphagia treatment. Pt's nurse tech reported that the pt has been tolerating the current diet without overt s/sx of aspiration. Mastication was prolonged with trials of regular texture and dysphagia 2/3 solids. Moderate oral residue was noted and multiple liquid washes were needed to clear it. Impulsive tendencies were observed and the pt required cues to reduce intake rate. In the absence of cueing, pt consumed >10 consecutive swallows and exhibited coughing thereafter. No other s/sx of aspiration were noted with smaller bolus sizes or when a reduced intake rate was used. A diet upgrade is not clinically indicated at this time, but SLP will continue to follow pt.    HPI HPI: Peter Cox is a 60 y.o. male with medical history significant of alcohol use, CKD 3, HIV dementia, HIV, hypertension, hyperlipidemia, history of CVA, physical deconditioning, urinary incontinence.  Pt with COVID-19 PNA.  Head CT negative for acute changes      SLP Plan  Continue with current plan of care       Recommendations  Diet recommendations: Dysphagia 1 (puree);Thin liquid Liquids provided via: Cup;Straw Medication Administration: Crushed with puree Supervision: Trained caregiver to feed patient Compensations: Minimize environmental distractions;Slow rate;Small sips/bites Postural Changes and/or Swallow Maneuvers: Seated upright 90 degrees;Upright 30-60 min after meal                Oral Care Recommendations: Oral care BID;Staff/trained caregiver to provide oral care Follow up Recommendations: Skilled Nursing facility;24 hour supervision/assistance SLP Visit Diagnosis: Dysphagia, oral phase (R13.11) Plan: Continue with  current plan of care       [name] I. Hardin Negus, [place], [place] Office number [phone] Pager [place] 07/06/2020, 10:58 AM

## 2020-07-06 NOTE — Progress Notes (Signed)
[facility] Progress Note  Patient: Peter Cox    GNF:[id number]  DOA: 07/02/2020     Date of Service: the patient was seen and examined on 07/06/2020  Brief hospital course: Past medical history of CKD 3B, HIV dementia, HTN, HLD, CVA, deconditioning dependency for ADL, incontinence, history of alcohol use. From ALF. Presents with confusion and fever and chills.  Found to have COVID-19 pneumonia. Currently plan is monitor for improvement in oral intake, unable to advance further from dysphagia 1 diet.  Assessment and Plan: 1. Acute COVID-19 Viral Pneumonia CXR: No evidence of hazy bilateral peripheral opacities Oxygen requirement: Room air right now Remdesivir: Not indicated for now. Steroids: Not indicated Baricitinib/Actemra: Not indicated Antibiotics: Not indicated, patient is started on IV ceftriaxone and azithromycin.  Currently discontinued. DVT Prophylaxis: SCDs Start: 07/02/20 2034  Prone positioning and incentive spirometer use recommended.  Overall plan: Mild asymptomatic infection.  No acute intervention right now.  The treatment plan and use of medications and known side effects were discussed with patient/family. It was clearly explained that complete risks and long-term side effects are unknown. Patient/family agree with the treatment plan.    2.  Acute metabolic encephalopathy History of HIV dementia Dysphagia Ongoing. Confusion improving and appears to be baseline. Speech therapy consult recommending dysphagia 1 diet for now. PT OT recommends transition back to ALF when stable. No further work-up for now Still remains confused with minimal oral intake. Monitor for p.o. intake improvement.  3. Hypertensive urgency Likely contributing to patient's encephalopathy as well. Initiating p.o. medication as well.  4. HIV Continue Biktarvy  5. Seizure history On Depakote production does not appear to be having any acute seizures right now. Depakote was  switched to IV and dose was increased. Recheck Depakote level and switch to p.o. better  6. CVA and HLD Currently no acute CVA based on the CT scan. Continue statin. Monitor.  7.  Mild hyperkalemia  from hemodialysis.  Resolved.  8. CKD stage IIIb. Renal function stable and close to baseline. Continue to monitor. No further IV fluids.  9. hyperbilirubinemia, elevated AST. Secondary to COVID-19.  Monitor.  10. Chronic thrombocytopenia. Currently limiting the ability to initiate antiplatelet medication for his history of CVA.  Diet: Dysphagia 1 diet DVT Prophylaxis: Subcutaneous Heparin   SCDs Start: 07/02/20 2034    Advance goals of care discussion: Full code  Family Communication: no family was present at bedside, at the time of interview.  Discussed with daughter on the phone.  On 1/16.  Disposition:  Status is: Inpatient  Remains inpatient appropriate because:IV treatments appropriate due to intensity of illness or inability to take PO  Dispo: The patient is from: ALF              Anticipated d/c is to: ALF              Anticipated d/c date is: 2 days              Patient currently is not medically stable to d/c.  Subjective: No nausea or vomiting but no fever no chills.  Minimal oral intake still.  No acute events overnight.  Physical Exam:  General: Appear in mild distress, no Rash; Oral Mucosa Clear, moist. no Abnormal Neck Mass Or lumps, Conjunctiva normal  Cardiovascular: S1 and S2 Present, no Murmur, Respiratory: increased respiratory effort, Bilateral Air entry present and bilateral  Crackles, no wheezes Abdomen: Bowel Sound present, Soft and no tenderness Extremities: trace Pedal edema Neurology: alert and  oriented to time, place, and person affect appropriate. no new focal deficit Gait not checked due to patient safety concerns  Vitals:   07/06/20 0518 07/06/20 1223 07/06/20 1624 07/06/20 2018  BP: 133/84  114/69 96/76  Pulse: 79  72 96  Resp: 11   12   Temp: 98.7 F (37.1 C)  99.3 F (37.4 C) (!) 100.4 F (38 C)  TempSrc: Oral   Oral  SpO2: 93% 98% 98% 100%  Weight:      Height:        Intake/Output Summary (Last 24 hours) at 07/06/2020 2100 Last data filed at 07/06/2020 0900 Gross per 24 hour  Intake 240 ml  Output -  Net 240 ml   Filed Weights   07/02/20 1954  Weight: 60.8 kg    Data Reviewed: I have personally reviewed and interpreted daily labs, tele strips, imaging. I reviewed all nursing notes, pharmacy notes, vitals, pertinent old records I have discussed plan of care as described above with RN and patient/family.  CBC: Recent Labs  Lab 07/02/20 1635 07/03/20 0415 07/05/20 0708 07/06/20 0706  WBC 8.2 5.9 3.3* 2.5*  NEUTROABS 6.0  --  2.1 1.2*  HGB 14.1 12.7* 12.7* 13.7  HCT 41.5 38.6* 38.3* 41.3  MCV 95.8 96.0 95.0 95.4  PLT 122* 109* 104* 97*   Basic Metabolic Panel: Recent Labs  Lab 07/02/20 1635 07/03/20 0415 07/05/20 0708 07/06/20 0706  NA 141 138 141 143  K 4.4 5.6* 4.1 4.0  CL 106 105 107 107  CO2 24 22 22 23   GLUCOSE 101* 74 96 90  BUN 25* 23 32* 35*  CREATININE 2.86* 2.36* 2.39* 2.76*  CALCIUM 9.2 8.5* 8.5* 8.9  MG  --   --  2.0  --     Studies: No results found.  Scheduled Meds: . amLODipine  10 mg Oral Daily  . atorvastatin  20 mg Oral Daily  . bictegravir-emtricitabine-tenofovir AF  1 tablet Oral Daily  . divalproex  250 mg Oral BID  . donepezil  10 mg Oral Daily  . feeding supplement  237 mL Oral BID BM  . hydrALAZINE  25 mg Oral Q8H  . labetalol  100 mg Oral TID  . nitroGLYCERIN  1 inch Topical Q6H  . sodium chloride flush  3 mL Intravenous Q12H   Continuous Infusions:  PRN Meds: acetaminophen **OR** acetaminophen, labetalol, OLANZapine, polyethylene glycol  Time spent: 35 minutes  Author: Berle Mull, MD [facility] 07/06/2020 9:00 PM  To reach On-call, see care teams to locate the attending and reach out via [email or web address]. Between 7PM-7AM, please  contact night-coverage If you still have difficulty reaching the attending provider, please page the [facility] (Director on Call) for [facility] on amion for assistance.

## 2020-07-06 NOTE — Progress Notes (Signed)
Physical Therapy Treatment Patient Details Name: Peter Cox MRN: [id number] DOB: 08-17-1960 Today's Date: 07/06/2020    History of Present Illness 60 year old male with history of cocaine abuse, alcohol abuse, HIV on antiretroviral, early dementia, stroke comes in with chief complaint of chills and AMS.  Patient resides at [facility] assisted living. Not vaccinated. Covid +.    PT Comments    Pt with limited verbalizations throughout session but able to increase gait tolerance. Pt drifting off when attempting to progress HEP and function. Pt educated for need to increase mobility to return to ALF and encouraged to be up for meals with nursing.     Follow Up Recommendations  Home health PT     Equipment Recommendations  Rolling walker with 5" wheels    Recommendations for Other Services       Precautions / Restrictions Precautions Precautions: Fall    Mobility  Bed Mobility Overal bed mobility: Needs Assistance Bed Mobility: Supine to Sit     Supine to sit: Min guard     General bed mobility comments: guarding for safety without physical assist  Transfers Overall transfer level: Needs assistance     Sit to Stand: Min assist         General transfer comment: guarding for safety with assist to stabilize RW as pt pulling up on it despite cues  Ambulation/Gait Ambulation/Gait assistance: Min guard Gait Distance (Feet): 150 Feet Assistive device: Rolling walker (2 wheeled) Gait Pattern/deviations: Step-through pattern;Decreased stride length   Gait velocity interpretation: <1.8 ft/sec, indicate of risk for recurrent falls General Gait Details: cues for increased stride, stabiliy and direction   Stairs             Wheelchair Mobility    Modified Rankin (Stroke Patients Only)       Balance Overall balance assessment: Needs assistance Sitting-balance support: No upper extremity supported Sitting balance-Leahy Scale: Good     Standing  balance support: Bilateral upper extremity supported Standing balance-Leahy Scale: Poor Standing balance comment: reliant on BUE support                            Cognition Arousal/Alertness: Awake/alert Behavior During Therapy: Flat affect Overall Cognitive Status: No family/caregiver present to determine baseline cognitive functioning                                 General Comments: pt able to state name and reply yes/no to questions. Mostly laughing and limited verbalizations throughout session      Exercises General Exercises - Lower Extremity Long Arc Quad: AROM;Both;Seated;15 reps Hip Flexion/Marching: AROM;Both;15 reps;Seated    General Comments        Pertinent Vitals/Pain Pain Assessment: No/denies pain Faces Pain Scale: No hurt    Home Living                      Prior Function            PT Goals (current goals can now be found in the care plan section) Progress towards PT goals: Progressing toward goals    Frequency    Min 3X/week      PT Plan Current plan remains appropriate    Co-evaluation              AM-PAC PT "6 Clicks" Mobility   Outcome Measure  Help needed turning  from your back to your side while in a flat bed without using bedrails?: A Little Help needed moving from lying on your back to sitting on the side of a flat bed without using bedrails?: A Little Help needed moving to and from a bed to a chair (including a wheelchair)?: A Little Help needed standing up from a chair using your arms (e.g., wheelchair or bedside chair)?: A Little Help needed to walk in hospital room?: A Little Help needed climbing 3-5 steps with a railing? : A Lot 6 Click Score: 17    End of Session Equipment Utilized During Treatment: Gait belt Activity Tolerance: Patient tolerated treatment well Patient left: in chair;with call bell/phone within reach Nurse Communication: Mobility status PT Visit Diagnosis:  Unsteadiness on feet (R26.81);Muscle weakness (generalized) (M62.81)     Time: 5520-8022 PT Time Calculation (min) (ACUTE ONLY): 18 min  Charges:  $Gait Training: 8-22 mins                     Bayard Males, PT Acute Rehabilitation Services Pager: [phone] Office: [place] 07/06/2020, 12:28 PM

## 2020-07-06 NOTE — Plan of Care (Signed)

## 2020-07-07 ENCOUNTER — Inpatient Hospital Stay (HOSPITAL_COMMUNITY): Payer: Medicare HMO

## 2020-07-07 DIAGNOSIS — R609 Edema, unspecified: Secondary | ICD-10-CM | POA: Diagnosis not present

## 2020-07-07 DIAGNOSIS — G9340 Encephalopathy, unspecified: Secondary | ICD-10-CM

## 2020-07-07 DIAGNOSIS — U071 COVID-19: Secondary | ICD-10-CM | POA: Diagnosis not present

## 2020-07-07 LAB — CBC
HCT: 37.4 % — ABNORMAL LOW (ref 39.0–52.0)
Hemoglobin: 12.9 g/dL — ABNORMAL LOW (ref 13.0–17.0)
MCH: 32.5 pg (ref 26.0–34.0)
MCHC: 34.5 g/dL (ref 30.0–36.0)
MCV: 94.2 fL (ref 80.0–100.0)
Platelets: 98 10*3/uL — ABNORMAL LOW (ref 150–400)
RBC: 3.97 MIL/uL — ABNORMAL LOW (ref 4.22–5.81)
RDW: 12.9 % (ref 11.5–15.5)
WBC: 2.7 10*3/uL — ABNORMAL LOW (ref 4.0–10.5)
nRBC: 0 % (ref 0.0–0.2)

## 2020-07-07 LAB — COMPREHENSIVE METABOLIC PANEL
ALT: 14 U/L (ref 0–44)
AST: 18 U/L (ref 15–41)
Albumin: 3.4 g/dL — ABNORMAL LOW (ref 3.5–5.0)
Alkaline Phosphatase: 55 U/L (ref 38–126)
Anion gap: 12 (ref 5–15)
BUN: 44 mg/dL — ABNORMAL HIGH (ref 8–23)
CO2: 22 mmol/L (ref 22–32)
Calcium: 8.7 mg/dL — ABNORMAL LOW (ref 8.9–10.3)
Chloride: 107 mmol/L (ref 98–111)
Creatinine, Ser: 3.2 mg/dL — ABNORMAL HIGH (ref 0.61–1.24)
GFR, Estimated: 21 mL/min — ABNORMAL LOW (ref >60)
Glucose, Bld: 93 mg/dL (ref 70–99)
Potassium: 4.3 mmol/L (ref 3.5–5.1)
Sodium: 141 mmol/L (ref 135–145)
Total Bilirubin: 0.7 mg/dL (ref 0.3–1.2)
Total Protein: 6.8 g/dL (ref 6.5–8.1)

## 2020-07-07 LAB — CULTURE, BLOOD (ROUTINE X 2)
Culture: NO GROWTH
Culture: NO GROWTH
Special Requests: ADEQUATE
Special Requests: ADEQUATE

## 2020-07-07 LAB — BLOOD GAS, ARTERIAL
Acid-base deficit: 0.2 mmol/L (ref 0.0–2.0)
Bicarbonate: 23.5 mmol/L (ref 20.0–28.0)
FIO2: 21
O2 Saturation: 96.7 %
Patient temperature: 37
pCO2 arterial: 35.5 mmHg (ref 32.0–48.0)
pH, Arterial: 7.436 (ref 7.350–7.450)
pO2, Arterial: 81.8 mmHg — ABNORMAL LOW (ref 83.0–108.0)

## 2020-07-07 LAB — GLUCOSE, CAPILLARY: Glucose-Capillary: 132 mg/dL — ABNORMAL HIGH (ref 70–99)

## 2020-07-07 LAB — LACTIC ACID, PLASMA
Lactic Acid, Venous: 1.1 mmol/L (ref 0.5–1.9)
Lactic Acid, Venous: 1 mmol/L (ref 0.5–1.9)

## 2020-07-07 LAB — MAGNESIUM: Magnesium: 2.2 mg/dL (ref 1.7–2.4)

## 2020-07-07 LAB — VALPROIC ACID LEVEL: Valproic Acid Lvl: 56 ug/mL (ref 50.0–100.0)

## 2020-07-07 LAB — D-DIMER, QUANTITATIVE: D-Dimer, Quant: 10.59 ug/mL-FEU — ABNORMAL HIGH (ref 0.00–0.50)

## 2020-07-07 LAB — C-REACTIVE PROTEIN: CRP: 4.3 mg/dL — ABNORMAL HIGH (ref <1.0)

## 2020-07-07 LAB — AMMONIA: Ammonia: 40 umol/L — ABNORMAL HIGH (ref 9–35)

## 2020-07-07 MED ORDER — LACTATED RINGERS IV SOLN: INTRAVENOUS | Status: DC

## 2020-07-07 MED ORDER — ENSURE ENLIVE PO LIQD
237.0000 mL | Freq: Two times a day (BID) | ORAL | Status: DC
  Administered 2020-07-10: 237 mL via ORAL

## 2020-07-07 MED ORDER — DEXTROSE IN LACTATED RINGERS 5 % IV SOLN: INTRAVENOUS | Status: DC

## 2020-07-07 NOTE — Progress Notes (Signed)
Lower extremity venous has been completed.   Preliminary results in CV Proc.   Abram Sander 07/07/2020 1:52 PM

## 2020-07-07 NOTE — Procedures (Signed)
Patient Name: Peter Cox  MRN: [id number]  Epilepsy Attending: Lora Havens  Referring Physician/Provider: Dr Berle Mull Date: 07/07/2020 Duration: 22.40 mins  Patient history: 60yo M with seizure, ams.EEG to evaluate for seizure.  Level of alertness: Awake  AEDs during EEG study: VPA  Technical aspects: This EEG study was done with scalp electrodes positioned according to the 10-20 International system of electrode placement. Electrical activity was acquired at a sampling rate of 500Hz  and reviewed with a high frequency filter of 70Hz  and a low frequency filter of 1Hz . EEG data were recorded continuously and digitally stored.   Description: The posterior dominant rhythm consists of 8-9 Hz activity of moderate voltage (25-35 uV) seen predominantly in posterior head regions, symmetric and reactive to eye opening and eye closing. Hyperventilation and photic stimulation were not performed.     IMPRESSION: This study is within normal limits. No seizures or epileptiform discharges were seen throughout the recording.  Peter Cox

## 2020-07-07 NOTE — Progress Notes (Signed)
[facility] Progress Note  Patient: Peter Cox    AYT:[id number]  DOA: 07/02/2020     Date of Service: the patient was seen and examined on 07/07/2020  Brief hospital course: Past medical history of CKD 3B, HIV dementia, HTN, HLD, CVA, deconditioning dependency for ADL, incontinence, history of alcohol use. From ALF. Presents with confusion and fever and chills.  Found to have COVID-19 pneumonia. Currently plan is monitor for improvement in oral intake, engage with family regarding goals of care.  Assessment and Plan: 1. Acute COVID-19 Viral Pneumonia CXR: No evidence of hazy bilateral peripheral opacities Oxygen requirement: Room air right now Remdesivir: Not indicated for now. Steroids: Not indicated Baricitinib/Actemra: Not indicated Antibiotics: Not indicated, patient is started on IV ceftriaxone and azithromycin.  Currently discontinued. DVT Prophylaxis: SCDs Start: 07/02/20 2034  Prone positioning and incentive spirometer use recommended.  Overall plan: Mild asymptomatic infection.  No acute intervention right now.  2.  Acute metabolic encephalopathy History of HIV dementia Dysphagia Ongoing. Confusion waxing and waning. Speech therapy consult recommending dysphagia 1 diet for now. PT OT recommends transition back to ALF when stable. No further work-up for now Still remains confused with minimal oral intake. Monitor for p.o. intake improvement. On 1/18 patient become unresponsive.  CT head, EEG, ammonia, Depakote level, CBG, ABG unremarkable.  MRI pending. Per RN after initial work-up patient is in and out of consciousness although at the time of my evaluation patient is back to where he is was mentation wise on 1/17.  3. Hypertensive urgency Likely contributing to patient's encephalopathy as well. Initiating p.o. medication as well.  4. HIV Continue Biktarvy  5. Seizure history On Depakote production does not appear to be having any acute seizures  right now. Depakote was switched to IV and dose was increased. Repeat Depakote level is within normal limits.  6. CVA and HLD Currently no acute CVA based on the CT scan. Continue statin. Monitor.  7.  Mild hyperkalemia  from hemodialysis.  Resolved.  8.  Acute kidney injury on CKD stage IIIb. Renal function worsening secondary to poor p.o. intake. Initiating IV fluids.  9. hyperbilirubinemia, elevated AST. Secondary to COVID-19.  Monitor.  10. Chronic thrombocytopenia. Currently limiting the ability to initiate antiplatelet medication for his history of CVA.  11. goals of care conversation. I had extensive discussion with patient's daughter on the phone regarding his goals of care. Daughter mentioned that the patient should not be resuscitated and therefore CODE STATUS was changed from full code to DNR. On further clarification was a goal of care patient currently is not eating adequately p.o. due to his encephalopathy and progressive dementia.  Patient is unable to tolerate diet to be on dysphagia 1 diet. Tube feeding in a patient who is having waxing and waning mental status changes places the patient at high risk for aspiration.  Recommended transition to comfort care and residential hospice if his condition does not improve which the family currently agrees.  Currently they want to monitor on further work-up and see if there is any improvement in patient's condition tomorrow or if there is a reversible cause of patient's encephalopathy..  Diet: Dysphagia 1 diet DVT Prophylaxis:   SCDs Start: 07/02/20 2034    Advance goals of care discussion: DNR  Family Communication: no family was present at bedside, at the time of interview.  Discussed with daughter on the phone. Opportunity was given to ask question and all questions were answered satisfactorily.   Disposition:  Status is:  Inpatient  Remains inpatient appropriate because:IV treatments appropriate due to intensity  of illness or inability to take PO   Dispo: The patient is from: Home              Anticipated d/c is to: to be determined               Anticipated d/c date is: 2 days              Patient currently is not medically stable to d/c.  Subjective: Early in the morning patient was unresponsive.  Unable to follow any commands.  Will not open up his eyes and when attempting to open it well tightly keep them close.  Spontaneously moving all extremities.  Later in the day becomes alert and awake again denies any acute complaint.  Physical Exam:  General: Appear in mild distress, no Rash; Oral Mucosa Clear, moist. no Abnormal Neck Mass Or lumps, Conjunctiva normal  Cardiovascular: S1 and S2 Present, no Murmur, Respiratory: good respiratory effort, Bilateral Air entry present and CTA, no Crackles, no wheezes Abdomen: Bowel Sound present, Soft and no tenderness Extremities: no Pedal edema Neurology: alert and not oriented affect flat. no new focal deficit, chronically confused. Gait not checked due to patient safety concerns  Initial examination. Lethargic, not responsive to sternal rub, spontaneously moving all extremities.  No tremors noted.  Vitals:   07/06/20 1624 07/06/20 2018 07/07/20 0603 07/07/20 1221  BP: 114/69 96/76 123/70 136/89  Pulse: 72 96 75   Resp: 12 18  11   Temp: 99.3 F (37.4 C) (!) 100.4 F (38 C) 99.5 F (37.5 C) 98.6 F (37 C)  TempSrc:  Oral Oral Oral  SpO2: 98% 100% 94% 100%  Weight:      Height:       No intake or output data in the 24 hours ending 07/07/20 2036 Filed Weights   07/02/20 1954  Weight: 60.8 kg    Data Reviewed: I have personally reviewed and interpreted daily labs, tele strips, imaging. I reviewed all nursing notes, pharmacy notes, vitals, pertinent old records I have discussed plan of care as described above with RN and patient/family.  CBC: Recent Labs  Lab 07/02/20 1635 07/03/20 0415 07/05/20 0708 07/06/20 0706 07/07/20 0408   WBC 8.2 5.9 3.3* 2.5* 2.7*  NEUTROABS 6.0  --  2.1 1.2*  --   HGB 14.1 12.7* 12.7* 13.7 12.9*  HCT 41.5 38.6* 38.3* 41.3 37.4*  MCV 95.8 96.0 95.0 95.4 94.2  PLT 122* 109* 104* 97* 98*   Basic Metabolic Panel: Recent Labs  Lab 07/02/20 1635 07/03/20 0415 07/05/20 0708 07/06/20 0706 07/07/20 0408  NA 141 138 141 143 141  K 4.4 5.6* 4.1 4.0 4.3  CL 106 105 107 107 107  CO2 24 22 22 23 22   GLUCOSE 101* 74 96 90 93  BUN 25* 23 32* 35* 44*  CREATININE 2.86* 2.36* 2.39* 2.76* 3.20*  CALCIUM 9.2 8.5* 8.5* 8.9 8.7*  MG  --   --  2.0  --  2.2    Studies: CT HEAD WO CONTRAST  Result Date: 07/07/2020 CLINICAL DATA:  Neuro deficit, stroke suspected. EXAM: CT HEAD WITHOUT CONTRAST TECHNIQUE: Contiguous axial images were obtained from the base of the skull through the vertex without intravenous contrast. COMPARISON:  Multiple priors including most recent head CT July 03, 2020 FINDINGS: Brain: Similar severe burden of chronic ischemic white matter disease. Chronic lacunar type infarcts in the right cerebellar hemisphere and right basal  ganglia. No CT evidence of acute infarction. No evidence of parenchymal hemorrhage or extra-axial fluid collection. No mass lesion mass effect or midline shift. Mild age-appropriate volume loss. No hydrocephalus. Vascular: No hyperdense vessel or unexpected calcification. Atherosclerotic calcifications internal carotid arteries. Skull: Normal. Negative for fracture or focal lesion. Sinuses/Orbits: Unchanged mild mucoperiosteal thickening throughout the ethmoid air cells bilaterally. Remainder of the paranasal sinuses and mastoid air cells appear clear. Other: None. IMPRESSION: 1. No CT evidence of acute intracranial abnormality. 2. Similar severe burden of chronic ischemic white matter disease with chronic lacunar type infarcts in the right cerebellar hemisphere and right basal ganglia. Electronically Signed   By: Dahlia Bailiff MD   On: 07/07/2020 13:09   EEG  adult  Result Date: 07/07/2020 Lora Havens, MD     07/07/2020  4:32 PM Patient Name: MALIKHI OGAN MRN: [id number] Epilepsy Attending: Lora Havens Referring Physician/Provider: Dr Berle Mull Date: 07/07/2020 Duration: 22.40 mins Patient history: 60yo M with seizure, ams.EEG to evaluate for seizure. Level of alertness: Awake AEDs during EEG study: VPA Technical aspects: This EEG study was done with scalp electrodes positioned according to the 10-20 International system of electrode placement. Electrical activity was acquired at a sampling rate of 500Hz  and reviewed with a high frequency filter of 70Hz  and a low frequency filter of 1Hz . EEG data were recorded continuously and digitally stored. Description: The posterior dominant rhythm consists of 8-9 Hz activity of moderate voltage (25-35 uV) seen predominantly in posterior head regions, symmetric and reactive to eye opening and eye closing. Hyperventilation and photic stimulation were not performed.   IMPRESSION: This study is within normal limits. No seizures or epileptiform discharges were seen throughout the recording. Priyanka O Yadav   VAS [name] LOWER EXTREMITY VENOUS (DVT)  Result Date: 07/07/2020  Lower Venous DVT Study Indications: Edema.  Comparison Study: no prior Performing Technologist: Abram Sander RVS  Examination Guidelines: A complete evaluation includes B-mode imaging, spectral Doppler, color Doppler, and power Doppler as needed of all accessible portions of each vessel. Bilateral testing is considered an integral part of a complete examination. Limited examinations for reoccurring indications may be performed as noted. The reflux portion of the exam is performed with the patient in reverse Trendelenburg.  +---------+---------------+---------+-----------+----------+--------------+ RIGHT    CompressibilityPhasicitySpontaneityPropertiesThrombus Aging +---------+---------------+---------+-----------+----------+--------------+ CFV       Full           Yes      Yes                                 +---------+---------------+---------+-----------+----------+--------------+ SFJ      Full                                                        +---------+---------------+---------+-----------+----------+--------------+ FV Prox  Full                                                        +---------+---------------+---------+-----------+----------+--------------+ FV Mid   Full                                                        +---------+---------------+---------+-----------+----------+--------------+  FV DistalFull                                                        +---------+---------------+---------+-----------+----------+--------------+ PFV      Full                                                        +---------+---------------+---------+-----------+----------+--------------+ POP      Full           Yes      Yes                                 +---------+---------------+---------+-----------+----------+--------------+ PTV      Full                                                        +---------+---------------+---------+-----------+----------+--------------+ PERO     Full                                                        +---------+---------------+---------+-----------+----------+--------------+   +---------+---------------+---------+-----------+----------+--------------+ LEFT     CompressibilityPhasicitySpontaneityPropertiesThrombus Aging +---------+---------------+---------+-----------+----------+--------------+ CFV      Full           Yes      Yes                                 +---------+---------------+---------+-----------+----------+--------------+ SFJ      Full                                                        +---------+---------------+---------+-----------+----------+--------------+ FV Prox  Full                                                         +---------+---------------+---------+-----------+----------+--------------+ FV Mid   Full                                                        +---------+---------------+---------+-----------+----------+--------------+ FV DistalFull                                                        +---------+---------------+---------+-----------+----------+--------------+   PFV      Full                                                        +---------+---------------+---------+-----------+----------+--------------+ POP      Full           Yes      Yes                                 +---------+---------------+---------+-----------+----------+--------------+ PTV      Full                                                        +---------+---------------+---------+-----------+----------+--------------+ PERO     Full                                                        +---------+---------------+---------+-----------+----------+--------------+     Summary: BILATERAL: - No evidence of deep vein thrombosis seen in the lower extremities, bilaterally. - No evidence of superficial venous thrombosis in the lower extremities, bilaterally. -No evidence of popliteal cyst, bilaterally.   *See table(s) above for measurements and observations. Electronically signed by Deitra Mayo MD on 07/07/2020 at 4:08:40 PM.    Final     Scheduled Meds: . bictegravir-emtricitabine-tenofovir AF  1 tablet Oral Daily  . divalproex  250 mg Oral BID  . feeding supplement  237 mL Oral BID BM  . sodium chloride flush  3 mL Intravenous Q12H   Continuous Infusions: . dextrose 5% lactated ringers 100 mL/hr at 07/07/20 1227   PRN Meds: acetaminophen **OR** acetaminophen, labetalol  Time spent: 35 minutes  Author: Berle Mull, MD [facility] 07/07/2020 8:36 PM  To reach On-call, see care teams to locate the attending and reach out via [email or web address]. Between 7PM-7AM,  please contact night-coverage If you still have difficulty reaching the attending provider, please page the [facility] (Director on Call) for [facility] on amion for assistance.

## 2020-07-07 NOTE — Progress Notes (Signed)
EEG complete - results pending 

## 2020-07-07 NOTE — Plan of Care (Signed)
Patient remains in and out of consciousness.MD aware. Tests and lab work completed as ordered. Patient denies pain or discomfort. Safety precaytions maintained.

## 2020-07-07 NOTE — Progress Notes (Signed)
Occupational Therapy Treatment Patient Details Name: Peter Cox MRN: [id number] DOB: 1960/08/28 Today's Date: 07/07/2020    History of present illness 60 year old male with history of cocaine abuse, alcohol abuse, HIV on antiretroviral, early dementia, stroke comes in with chief complaint of chills and AMS.  Patient resides at [facility] assisted living. Not vaccinated. Covid +.   OT comments  Pt with gradual progress towards OT goals, benefits from cueing for appropriate sequencing of tasks. Pt overall Min A for mobility to/from sink using RW with assistance needed to maintain balance and maneuver RW appropriately. Pt overall Min A for ADLs standing at sink due to need for hands-on assist to prevent LOB due to posterior bias that becomes more prominent without UE support. DC recommendations remain appropriate.    Follow Up Recommendations  No OT follow up;Supervision - Intermittent (return to ALF)    Equipment Recommendations  Other (comment) (RW)    Recommendations for Other Services      Precautions / Restrictions Precautions Precautions: Fall Restrictions Weight Bearing Restrictions: No       Mobility Bed Mobility Overal bed mobility: Needs Assistance Bed Mobility: Supine to Sit     Supine to sit: Supervision     General bed mobility comments: Supervision, handheld assist for support no physical assist actually needed  Transfers Overall transfer level: Needs assistance Equipment used: Rolling walker (2 wheeled) Transfers: Sit to/from Stand Sit to Stand: Min assist         General transfer comment: Min A with pt tendency to pull on RW, assistance needed to manuever RW and turn appropriately    Balance Overall balance assessment: Needs assistance Sitting-balance support: No upper extremity supported Sitting balance-Leahy Scale: Good     Standing balance support: Single extremity supported;Bilateral upper extremity supported;During functional  activity Standing balance-Leahy Scale: Poor Standing balance comment: reliant on at least one UE support                           ADL either performed or assessed with clinical judgement   ADL Overall ADL's : Needs assistance/impaired     Grooming: Minimal assistance;Standing;Wash/dry face Grooming Details (indicate cue type and reason): Min A to maintain balance to wash face with posterior bias     Lower Body Bathing: Minimal assistance;Sit to/from stand Lower Body Bathing Details (indicate cue type and reason): Min A for bathing peri region in standing, assistance needed to maintain balance due to posterior bias especially without UE support during task. Pt noticed to perseverate on certain tasks Upper Body Dressing : Supervision/safety;Sitting Upper Body Dressing Details (indicate cue type and reason): Cues for sequencing to doff/don hospital gown                 Functional mobility during ADLs: Minimal assistance;Rolling walker;Cueing for sequencing;Cueing for safety General ADL Comments: Pt with noted posterior bias, more prominent without UE support     Vision   Vision Assessment?: No apparent visual deficits   Perception     Praxis      Cognition Arousal/Alertness: Awake/alert Behavior During Therapy: Flat affect Overall Cognitive Status: No family/caregiver present to determine baseline cognitive functioning                                 General Comments: pt able to nod yes/no appropriately to questions, minimal verbalizations. Pt emotionally labile, laughing at inappropriate times  Exercises     Shoulder Instructions       General Comments Pt on RA, HR WFL    Pertinent Vitals/ Pain       Pain Assessment: No/denies pain  Home Living                                          Prior Functioning/Environment              Frequency  Min 2X/week        Progress Toward Goals  OT  Goals(current goals can now be found in the care plan section)  Progress towards OT goals: Progressing toward goals  Acute Rehab OT Goals Patient Stated Goal: none stated OT Goal Formulation: Patient unable to participate in goal setting ADL Goals Pt Will Perform Lower Body Bathing: with modified independence Pt Will Perform Lower Body Dressing: with modified independence Pt Will Transfer to Toilet: with modified independence;ambulating  Plan Discharge plan remains appropriate    Co-evaluation                 AM-PAC OT "6 Clicks" Daily Activity     Outcome Measure   Help from another person eating meals?: None Help from another person taking care of personal grooming?: A Little Help from another person toileting, which includes using toliet, bedpan, or urinal?: A Little Help from another person bathing (including washing, rinsing, drying)?: A Little Help from another person to put on and taking off regular upper body clothing?: A Little Help from another person to put on and taking off regular lower body clothing?: A Little 6 Click Score: 19    End of Session Equipment Utilized During Treatment: Gait belt;Rolling walker  OT Visit Diagnosis: Unsteadiness on feet (R26.81);Other abnormalities of gait and mobility (R26.89)   Activity Tolerance Patient tolerated treatment well   Patient Left in chair;with call bell/phone within reach;with nursing/sitter in room;Other (comment) (NT in room attending, chair pad placed under pt but no alarm box in room - NT aware)   Nurse Communication Mobility status        Time: 4008-6761 OT Time Calculation (min): 22 min  Charges: OT General Charges $OT Visit: 1 Visit OT Treatments $Self Care/Home Management : 8-22 mins  Layla Maw, OTR/L   Layla Maw 07/07/2020, 10:12 AM

## 2020-07-08 ENCOUNTER — Encounter (HOSPITAL_COMMUNITY): Payer: Self-pay | Admitting: Internal Medicine

## 2020-07-08 ENCOUNTER — Inpatient Hospital Stay (HOSPITAL_COMMUNITY): Payer: Medicare HMO

## 2020-07-08 DIAGNOSIS — U071 COVID-19: Secondary | ICD-10-CM | POA: Diagnosis not present

## 2020-07-08 DIAGNOSIS — G9349 Other encephalopathy: Secondary | ICD-10-CM

## 2020-07-08 DIAGNOSIS — Z7189 Other specified counseling: Secondary | ICD-10-CM

## 2020-07-08 DIAGNOSIS — G9340 Encephalopathy, unspecified: Secondary | ICD-10-CM | POA: Diagnosis not present

## 2020-07-08 DIAGNOSIS — F0390 Unspecified dementia without behavioral disturbance: Secondary | ICD-10-CM

## 2020-07-08 DIAGNOSIS — Z515 Encounter for palliative care: Secondary | ICD-10-CM

## 2020-07-08 LAB — CBC
HCT: 39.3 % (ref 39.0–52.0)
Hemoglobin: 12.9 g/dL — ABNORMAL LOW (ref 13.0–17.0)
MCH: 31.2 pg (ref 26.0–34.0)
MCHC: 32.8 g/dL (ref 30.0–36.0)
MCV: 95.2 fL (ref 80.0–100.0)
Platelets: 95 10*3/uL — ABNORMAL LOW (ref 150–400)
RBC: 4.13 MIL/uL — ABNORMAL LOW (ref 4.22–5.81)
RDW: 12.6 % (ref 11.5–15.5)
WBC: 3.2 10*3/uL — ABNORMAL LOW (ref 4.0–10.5)
nRBC: 0 % (ref 0.0–0.2)

## 2020-07-08 LAB — COMPREHENSIVE METABOLIC PANEL
ALT: 14 U/L (ref 0–44)
AST: 18 U/L (ref 15–41)
Albumin: 3.1 g/dL — ABNORMAL LOW (ref 3.5–5.0)
Alkaline Phosphatase: 50 U/L (ref 38–126)
Anion gap: 9 (ref 5–15)
BUN: 32 mg/dL — ABNORMAL HIGH (ref 8–23)
CO2: 25 mmol/L (ref 22–32)
Calcium: 8.4 mg/dL — ABNORMAL LOW (ref 8.9–10.3)
Chloride: 109 mmol/L (ref 98–111)
Creatinine, Ser: 2.37 mg/dL — ABNORMAL HIGH (ref 0.61–1.24)
GFR, Estimated: 30 mL/min — ABNORMAL LOW (ref >60)
Glucose, Bld: 96 mg/dL (ref 70–99)
Potassium: 3.8 mmol/L (ref 3.5–5.1)
Sodium: 143 mmol/L (ref 135–145)
Total Bilirubin: 0.7 mg/dL (ref 0.3–1.2)
Total Protein: 6.2 g/dL — ABNORMAL LOW (ref 6.5–8.1)

## 2020-07-08 LAB — D-DIMER, QUANTITATIVE: D-Dimer, Quant: 1.4 ug/mL-FEU — ABNORMAL HIGH (ref 0.00–0.50)

## 2020-07-08 LAB — MAGNESIUM: Magnesium: 1.9 mg/dL (ref 1.7–2.4)

## 2020-07-08 LAB — C-REACTIVE PROTEIN: CRP: 3.8 mg/dL — ABNORMAL HIGH (ref <1.0)

## 2020-07-08 MED ORDER — GLYCOPYRROLATE 0.2 MG/ML IJ SOLN: 0.2000 mg | INTRAMUSCULAR | Status: DC | PRN

## 2020-07-08 MED ORDER — ACETAMINOPHEN 325 MG PO TABS: 650.0000 mg | ORAL_TABLET | Freq: Four times a day (QID) | ORAL | Status: DC | PRN

## 2020-07-08 MED ORDER — HALOPERIDOL LACTATE 2 MG/ML PO CONC
0.5000 mg | ORAL | Status: DC | PRN
  Filled 2020-07-08: qty 0.3

## 2020-07-08 MED ORDER — SENNOSIDES-DOCUSATE SODIUM 8.6-50 MG PO TABS: 1.0000 | ORAL_TABLET | Freq: Every evening | ORAL | Status: DC | PRN

## 2020-07-08 MED ORDER — LACTULOSE 10 GM/15ML PO SOLN
20.0000 g | Freq: Two times a day (BID) | ORAL | Status: DC
  Administered 2020-07-08: 20 g via ORAL
  Filled 2020-07-08: qty 30

## 2020-07-08 MED ORDER — LORAZEPAM 2 MG/ML IJ SOLN: 1.0000 mg | INTRAMUSCULAR | Status: DC | PRN

## 2020-07-08 MED ORDER — HALOPERIDOL 1 MG PO TABS: 0.5000 mg | ORAL_TABLET | ORAL | Status: DC | PRN

## 2020-07-08 MED ORDER — LACTULOSE 10 GM/15ML PO SOLN: 20.0000 g | Freq: Three times a day (TID) | ORAL | Status: DC

## 2020-07-08 MED ORDER — LORAZEPAM 1 MG PO TABS: 1.0000 mg | ORAL_TABLET | ORAL | Status: DC | PRN

## 2020-07-08 MED ORDER — MORPHINE SULFATE (CONCENTRATE) 10 MG/0.5ML PO SOLN: 2.6000 mg | ORAL | Status: DC | PRN

## 2020-07-08 MED ORDER — HALOPERIDOL LACTATE 5 MG/ML IJ SOLN: 0.5000 mg | INTRAMUSCULAR | Status: DC | PRN

## 2020-07-08 MED ORDER — BIOTENE DRY MOUTH MT LIQD: 15.0000 mL | OROMUCOSAL | Status: DC | PRN

## 2020-07-08 MED ORDER — ONDANSETRON 4 MG PO TBDP: 4.0000 mg | ORAL_TABLET | Freq: Four times a day (QID) | ORAL | Status: DC | PRN

## 2020-07-08 MED ORDER — LORAZEPAM 2 MG/ML PO CONC: 1.0000 mg | ORAL | Status: DC | PRN

## 2020-07-08 MED ORDER — ONDANSETRON HCL 4 MG/2ML IJ SOLN: 4.0000 mg | Freq: Four times a day (QID) | INTRAMUSCULAR | Status: DC | PRN

## 2020-07-08 MED ORDER — GLYCOPYRROLATE 1 MG PO TABS: 1.0000 mg | ORAL_TABLET | ORAL | Status: DC | PRN

## 2020-07-08 MED ORDER — ACETAMINOPHEN 650 MG RE SUPP: 650.0000 mg | Freq: Four times a day (QID) | RECTAL | Status: DC | PRN

## 2020-07-08 MED ORDER — POLYVINYL ALCOHOL 1.4 % OP SOLN
1.0000 [drp] | Freq: Four times a day (QID) | OPHTHALMIC | Status: DC | PRN
  Filled 2020-07-08: qty 15

## 2020-07-08 NOTE — Consult Note (Signed)
Consultation Note Date: 07/08/2020   Patient Name: Peter Cox  DOB: [birth year]  MRN: [id number]  Age / Sex: 60 y.o., male  PCP: [name], MD Referring Physician: Damita Lack, MD  Reason for Consultation: Establishing goals of care and Psychosocial/spiritual support  HPI/Patient Profile: 60 y.o. male  with past medical history of CKD 3, HIV dementia, CVA, deconditioning, HLD, incontinence, history of alcohol use, assisted living facility admitted on 07/02/2020 with COVID pneumonia  Clinical Assessment and Goals of Care: I have reviewed medical records including EPIC notes, labs and imaging, received report from bedside nursing  Call to daughter, Darlyn Chamber to discuss diagnosis prognosis, Outlook, EOL wishes, disposition and options.  I introduced Palliative Medicine as specialized medical care for people living with serious illness. It focuses on providing relief from the symptoms and stress of a serious illness.   As far as functional and nutritional status, Mr. Mario has been living at [facility].  Ivin Booty states that her father has "no desire to eat and drink, and he cant hang on like that".     We discussed current illness and what it means in the larger context of her on-going co-morbidities.  Natural disease trajectory and expectations at EOL were discussed.  I attempted to elicit values and goals of care important to the patient.  Let nature take it's course. Not going to bounce back to baseline as before. In ALF, use [facility] hosp[facility], would like them to go back [facility].   The difference between aggressive medical intervention and comfort care was considered in light of the patient's goals of care.  We talk about comfort measures only, let nature take its course.  We talked about prognosis with permission,  Advanced directives, concepts specific to code  status, artifical feeding and hydration, and rehospitalization were considered and discussed.  Ivin Booty states that they would never except a tube to feed Mr. Garringer artificially.  She talks about letting nature take its course, that she just wants to "keep him comfortable".  Hospice Care services were explained and offered. Daughter Maryelizabeth Kaufmann states that Mr. Ewen s ALF uses the local [facility] hospice.  She would like to have them serve her father.  Questions and concerns were addressed.  The family was encouraged to call with questions or concerns.   Conference with attending, bedside nursing staff, transition of care team related to patient condition, needs, goals of care, comfort care.   Orders adjusted.  Prognosis: 2 weeks or less expected based on frailty, poor by mouth intake, family's desire to focus on comfort and dignity, let nature take its course.   HCPOA   NEXT OF KIN -daughter, Linna Darner    SUMMARY OF RECOMMENDATIONS   Comfort care Requesting hospice services at residential ALF, [place]. [facility] uses local [facility], this is hospice of choice for her daughter.   Code Status/Advance Care Planning:  DNR  Symptom Management:   Per hospitalist, no additional needs at this time.  Palliative Prophylaxis:   Frequent Pain Assessment, Oral Care and Turn Reposition  Additional Recommendations (Limitations, Scope, Preferences):  Full Comfort Care  Psycho-social/Spiritual:   Desire for further Chaplaincy support:no  Additional Recommendations: Caregiving  Support/Resources and Education on Hospice  Prognosis:   Unable to determine, weeks likely based on acuity of condition, frailty, chronic illness burden.  Discharge Planning: To be determined, based on outcomes.  Anticipate acceptance to residential hospice.  Complicated by COVID-positive status.      Primary Diagnoses: Present on Admission: . Acute encephalopathy . CKD  (chronic kidney disease), stage III ([place]) . Decreased activities of daily living (ADL) . Dementia without behavioral disturbance ([place]) . Essential hypertension . HLD (hyperlipidemia) . Human immunodeficiency virus (HIV) disease ([place]) . Physical deconditioning . Acute metabolic encephalopathy   I have reviewed the medical record, interviewed the patient and family, and examined the patient. The following aspects are pertinent.  Past Medical History:  Diagnosis Date  . Alcohol abuse    heavy, clean since April  . Alcohol abuse 11/24/2014  . Cocaine abuse ([place])     clean since about 2000  . Drug abuse ([place]) 11/24/2014  . HIV infection ([place])    dx 08/08/2008 (unknown how he was exposed)  Initial VL 43,200 and CD4 410 on 08/28/08  . Hyperlipidemia   . Hypertension   . Marijuana abuse   . Mild cognitive impairment    likely vascular dementia/psa/?HIV componentCT Head 02/12/08 for htn crisis and concern for stroke: impression-1. Old right internal capsule and thalamic lacunar infarcts. 2. Mild to moderate chronic small vessell white matter ischemic changes in both cerebral hemisphere, greater on the right  . Neuromuscular disorder ([place])   . Seizures ([place])    "last year had seizure"  . Stroke ([place])   . Tobacco abuse    Social History   Socioeconomic History  . Marital status: Single    Spouse name: Not on file  . Number of children: Not on file  . Years of education: Not on file  . Highest education level: Not on file  Occupational History  . Occupation: Unemployed  Tobacco Use  . Smoking status: Current Every Day Smoker    Packs/day: 0.50    Years: 40.00    Pack years: 20.00    Types: Cigarettes  . Smokeless tobacco: Never Used  . Tobacco comment: Quit  x 2 week.  Vaping Use  . Vaping Use: Never used  Substance and Sexual Activity  . Alcohol use: Not Currently    Alcohol/week: 0.0 standard drinks    Comment: drink beer  . Drug use: Not Currently    Frequency: 7.0 times  per week    Types: Marijuana    Comment: use Marijuana every day per pt.  . Sexual activity: Not Currently    Birth control/protection: Condom    Comment: given 2 bags of condoms  Other Topics Concern  . Not on file  Social History Narrative   Lives alone in [place].  Male friend [name]) and her family assist the patient with home management.  They have been friends x 50yrs but are no longer sexually active.  Not working. 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      Right handed    Social Determinants of Health   Financial Resource Strain: Not on file  Food Insecurity: Not on file  Transportation Needs: Not on file  Physical Activity:  Not on file  Stress: Not on file  Social Connections: Not on file   Family History  Problem Relation Age of Onset  . Diabetes Mother   . Hypertension Mother   . Stroke Mother   . Diabetes Father   . Hypertension Sister   . Hypertension Sister   . Colon cancer Neg Hx    Scheduled Meds: . bictegravir-emtricitabine-tenofovir AF  1 tablet Oral Daily  . divalproex  250 mg Oral BID  . feeding supplement  237 mL Oral BID BM  . lactulose  20 g Oral TID  . sodium chloride flush  3 mL Intravenous Q12H   Continuous Infusions: . dextrose 5% lactated ringers 100 mL/hr at 07/08/20 0634   PRN Meds:.acetaminophen **OR** acetaminophen, labetalol, senna-docusate Medications Prior to Admission:  Prior to Admission medications   Medication Sig Start Date End Date Taking? Authorizing Provider  acetaminophen (TYLENOL) 500 MG tablet Take 1 tablet (500 mg total) by mouth in the morning and at bedtime. 10/15/19  Yes Bacigalupo, Dionne Bucy, MD  amLODipine (NORVASC) 10 MG tablet Take 1 tablet (10 mg total) by mouth daily. 09/27/19  Yes Fenton Malling M, PA-C  atorvastatin (LIPITOR) 20 MG tablet TAKE 1 TABLET BY MOUTH ONCE A DAY Patient taking differently: Take 20 mg by mouth daily. 06/23/20  Yes  Bacigalupo, Dionne Bucy, MD  bictegravir-emtricitabine-tenofovir AF (BIKTARVY) 50-200-25 MG TABS tablet Take 1 tablet by mouth daily. 10/10/19  Yes Golden Circle, FNP  cyanocobalamin 100 MCG tablet Take 100 mcg by mouth daily.   Yes [provider]  divalproex (DEPAKOTE) 125 MG DR tablet Take 250 mg by mouth 2 (two) times daily. Take 2 capsules by mouth twice daily.   Yes [provider]  donepezil (ARICEPT) 10 MG tablet Take 1/2 tablet daily for 2 weeks, then increase to 1 tablet daily Patient taking differently: Take 10 mg by mouth daily. 05/21/19  Yes Bacigalupo, Dionne Bucy, MD  hydrALAZINE (APRESOLINE) 25 MG tablet Take 1 tablet (25 mg total) by mouth 3 (three) times daily. Patient taking differently: Take 25 mg by mouth in the morning and at bedtime. 06/22/20  Yes Bacigalupo, Dionne Bucy, MD  labetalol (NORMODYNE) 100 MG tablet Take 1 tablet (100 mg total) by mouth 2 (two) times daily. 06/18/20  Yes Bacigalupo, Dionne Bucy, MD  lisinopril (ZESTRIL) 20 MG tablet Take 20 mg by mouth daily.   Yes [provider]  OLANZapine (ZYPREXA) 5 MG tablet Take 1 tablet (5 mg total) by mouth daily as needed (agitation). Patient taking differently: Take 5 mg by mouth daily. 06/22/20  Yes Bacigalupo, Dionne Bucy, MD   No Known Allergies Review of Systems  Unable to perform ROS: Mental status change    Physical Exam Vitals and nursing note reviewed.     Vital Signs: BP (!) 171/83   Pulse 66   Temp 98.7 F (37.1 C) (Oral)   Resp 16   Ht 5\' 9"  (1.753 m)   Wt 60.8 kg   SpO2 100%   BMI 19.79 kg/m  Pain Scale: 0-10   Pain Score: 0-No pain   SpO2: SpO2: 100 % O2 Device:SpO2: 100 % O2 Flow Rate: .   IO: Intake/output summary:   Intake/Output Summary (Last 24 hours) at 07/08/2020 1429 Last data filed at 07/08/2020 0548 Gross per 24 hour  Intake 1627.71 ml  Output 1100 ml  Net 527.71 ml    LBM: Last BM Date: 07/05/20 Baseline Weight: Weight: 60.8 kg Most recent weight:  Weight:  60.8 kg     Palliative Assessment/Data:   Flowsheet Rows   Flowsheet Row Most Recent Value  Intake Tab   Referral Department Hospitalist  Unit at Time of Referral Med/Surg Unit  Palliative Care Primary Diagnosis Neurology  Date Notified 07/07/20  Palliative Care Type New Palliative care  Reason for referral Clarify Goals of Care  Date of Admission 07/02/20  Date first seen by Palliative Care 07/08/20  # of days Palliative referral response time 1 Day(s)  # of days IP prior to Palliative referral 5  Clinical Assessment   Palliative Performance Scale Score 20%  Pain Max last 24 hours Not able to report  Pain Min Last 24 hours Not able to report  Dyspnea Max Last 24 Hours Not able to report  Dyspnea Min Last 24 hours Not able to report  Psychosocial & Spiritual Assessment   Palliative Care Outcomes       Time In: 1320 Time Out: 1430 Time Total: 70 minutes  Greater than 50%  of this time was spent counseling and coordinating care related to the above assessment and plan.  Signed by: Drue Novel, NP   Please contact Palliative Medicine Team phone at [phone] for questions and concerns.  For individual provider: See Shea Evans

## 2020-07-08 NOTE — TOC Progression Note (Addendum)
Transition of Care [facility]) - Progression Note    Patient Details  Name: Peter Cox MRN: [id number] Date of Birth: [birth year]  [facility] CM/SW [place], RN Phone Number: [phone]  07/08/2020, 3:35 PM  Clinical Narrative:    CM attempted to contact [facility] to determine if patient can return as comfort care patient. Message has been left for [facility].         Expected Discharge Plan and Services                                                 Social Determinants of Health (SDOH) Interventions    Readmission Risk Interventions No flowsheet data found.

## 2020-07-08 NOTE — Progress Notes (Signed)
PROGRESS NOTE    Peter Cox  TFT:[id number] DOB: 05/09/1961 DOA: 07/02/2020 PCP: [name], MD   Brief Narrative:  60 year old with past medical history of CKD stage IIIb, HIV dementia, HLD, CVA, deconditioning, HLD, incontinent, history of alcohol use from assisted living facility admitted for fever and chills found to have COVID-19 pneumonia. Currently has waxing and waning mentation status most of the work-up is negative besides mildly elevated ammonia levels. Poor oral intake.   Assessment & Plan:   Principal Problem:   Acute encephalopathy Active Problems:   Human immunodeficiency virus (HIV) disease ([place])   HLD (hyperlipidemia)   Essential hypertension   CKD (chronic kidney disease), stage III (HCC)   Dementia without behavioral disturbance (HCC)   History of CVA (cerebrovascular accident)   Decreased activities of daily living (ADL)   Physical deconditioning   Acute metabolic encephalopathy  Acute COVID-19 pneumonia, mild - Chest x-ray is overall clear. Currently on room air. Did not receive remdesivir, steroids, Actemra/baricitinib - Incentive spirometer and flutter valve.  Acute metabolic encephalopathy History of HIV dementia with history of HIV Dysphagia - Mentation is waxing and waning. - Speech and swallow-dysphagia 1 diet - PT/OT-return to ALF - CT head, EEG-unremarkable - Depakote level/normal - MRI- chronic advanced small vessel disease. No acute findings -Continue Biktarvy -Elevated ammonia. Will start lactulose  AKI on CKD stage IIIb - Prerenal from poor oral intake. Gentle hydration  Elevated D-dimer - Lower extremity Dopplers negative for DVT. Patient is currently not hypoxic. Not safe to obtain CTA PE due to renal dysfunction  Hyperbilirubinemia/transaminitis -Resolved. Closely monitor  Seizure disorder - Depakote levels normal. Currently on IV or p.o. Depakote as tolerated  Essential hypertension, uncontrolled - In acceptable  range  History of CVA and HLD - Statin. Not on antiplatelet due to thrombocytopenia  Chronic thrombocytopenia - Closely monitor. No obvious evidence of bleeding  Goals of care - Previous provider discussed with patient's daughter. Patient is DNR/DNI. Not a good candidate for tube feeds. Family contemplating hospice care/comfort care  Failure to thrive with lack of desire to eat. Daughter interested comfort feeding and reaching out to [place].   DVT prophylaxis: SCDs Code Status: DNR Family Communication: Daughter updated  Status is: Inpatient  Remains inpatient appropriate because:Inpatient level of care appropriate due to severity of illness   Dispo: The patient is from: ALF              Anticipated d/c is to: tbd              Anticipated d/c date is: 1 day              Patient currently is not medically stable to d/c.    Body mass index is 19.79 kg/m.       Subjective: Patient is awake watching TV.  According to nursing staff his mentation is waxing and waning.  He denies any complaints.  Does not want to eat or drink much.  Review of Systems Otherwise negative except as per HPI, including: General: Denies fever, chills, night sweats or unintended weight loss. Resp: Denies cough, wheezing, shortness of breath. Cardiac: Denies chest pain, palpitations, orthopnea, paroxysmal nocturnal dyspnea. GI: Denies abdominal pain, nausea, vomiting, diarrhea or constipation GU: Denies dysuria, frequency, hesitancy or incontinence MS: Denies muscle aches, joint pain or swelling Neuro: Denies headache, neurologic deficits (focal weakness, numbness, tingling), abnormal gait Psych: Denies anxiety, depression, SI/HI/AVH Skin: Denies new rashes or lesions ID: Denies sick contacts, exotic  exposures, travel  Examination:  General exam: Appears calm and comfortable, elderly frail Respiratory system: Clear to auscultation. Respiratory effort normal. Cardiovascular  system: S1 & S2 heard, RRR. No JVD, murmurs, rubs, gallops or clicks. No pedal edema. Gastrointestinal system: Abdomen is nondistended, soft and nontender. No organomegaly or masses felt. Normal bowel sounds heard. Central nervous system: Grossly moving all extremities. Extremities: Symmetric 5 x 5 power. Skin: No rashes, lesions or ulcers Psychiatry: Poor judgment and insight    Objective: Vitals:   07/07/20 0603 07/07/20 1221 07/07/20 2054 07/08/20 0630  BP: 123/70 136/89 (!) 155/81 (!) 164/79  Pulse: 75  73 75  Resp:  11 20 16   Temp: 99.5 F (37.5 C) 98.6 F (37 C) 98.3 F (36.8 C) 98.8 F (37.1 C)  TempSrc: Oral Oral Oral Oral  SpO2: 94% 100% 99% 100%  Weight:      Height:        Intake/Output Summary (Last 24 hours) at 07/08/2020 0721 Last data filed at 07/08/2020 0548 Gross per 24 hour  Intake 1627.71 ml  Output 1100 ml  Net 527.71 ml   Filed Weights   07/02/20 1954  Weight: 60.8 kg     Data Reviewed:   CBC: Recent Labs  Lab 07/02/20 1635 07/03/20 0415 07/05/20 0708 07/06/20 0706 07/07/20 0408  WBC 8.2 5.9 3.3* 2.5* 2.7*  NEUTROABS 6.0  --  2.1 1.2*  --   HGB 14.1 12.7* 12.7* 13.7 12.9*  HCT 41.5 38.6* 38.3* 41.3 37.4*  MCV 95.8 96.0 95.0 95.4 94.2  PLT 122* 109* 104* 97* 98*   Basic Metabolic Panel: Recent Labs  Lab 07/02/20 1635 07/03/20 0415 07/05/20 0708 07/06/20 0706 07/07/20 0408  NA 141 138 141 143 141  K 4.4 5.6* 4.1 4.0 4.3  CL 106 105 107 107 107  CO2 24 22 22 23 22   GLUCOSE 101* 74 96 90 93  BUN 25* 23 32* 35* 44*  CREATININE 2.86* 2.36* 2.39* 2.76* 3.20*  CALCIUM 9.2 8.5* 8.5* 8.9 8.7*  MG  --   --  2.0  --  2.2   GFR: Estimated Creatinine Clearance: 20.8 mL/min (A) (by C-G formula based on SCr of 3.2 mg/dL (H)). Liver Function Tests: Recent Labs  Lab 07/02/20 1635 07/03/20 0415 07/05/20 0708 07/06/20 0706 07/07/20 0408  AST 18 56* 18 18 18   ALT 18 13 14 15 14   ALKPHOS 71 55 56 55 55  BILITOT 0.8 1.5* 0.6 0.8 0.7   PROT 7.5 6.3* 6.5 7.0 6.8  ALBUMIN 4.1 3.4* 3.4* 3.5 3.4*   No results for input(s): LIPASE, AMYLASE in the last 168 hours. Recent Labs  Lab 07/07/20 1430  AMMONIA 40*   Coagulation Profile: Recent Labs  Lab 07/02/20 1635  INR 1.1   Cardiac Enzymes: No results for input(s): CKTOTAL, CKMB, CKMBINDEX, TROPONINI in the last 168 hours. BNP (last 3 results) No results for input(s): PROBNP in the last 8760 hours. HbA1C: No results for input(s): HGBA1C in the last 72 hours. CBG: Recent Labs  Lab 07/07/20 1217  GLUCAP 132*   Lipid Profile: No results for input(s): CHOL, HDL, LDLCALC, TRIG, CHOLHDL, LDLDIRECT in the last 72 hours. Thyroid Function Tests: No results for input(s): TSH, T4TOTAL, FREET4, T3FREE, THYROIDAB in the last 72 hours. Anemia Panel: No results for input(s): VITAMINB12, FOLATE, FERRITIN, TIBC, IRON, RETICCTPCT in the last 72 hours. Sepsis Labs: Recent Labs  Lab 07/02/20 1635 07/02/20 1843 07/06/20 0706 07/07/20 1430 07/07/20 1703  PROCALCITON  --   --  0.59  --   --   LATICACIDVEN 1.3 0.9  --  1.1 1.0    Recent Results (from the past 240 hour(s))  Blood Culture (routine x 2)     Status: None   Collection Time: 07/02/20  4:35 PM   Specimen: BLOOD RIGHT ARM  Result Value Ref Range Status   Specimen Description BLOOD RIGHT ARM  Final   Special Requests   Final    BOTTLES DRAWN AEROBIC AND ANAEROBIC Blood Culture adequate volume   Culture   Final    NO GROWTH 5 DAYS Performed at [facility], [address], [place], [place] [address]    Report Status 07/07/2020 FINAL  Final  Urine culture     Status: Abnormal   Collection Time: 07/02/20  4:35 PM   Specimen: In/Out Cath Urine  Result Value Ref Range Status   Specimen Description IN/OUT CATH URINE  Final   Special Requests   Final    NONE Performed at [facility], [place] [address] [place], [place] [address]    Culture MULTIPLE SPECIES PRESENT, SUGGEST RECOLLECTION (A)  Final    Report Status 07/04/2020 FINAL  Final  SARS CORONAVIRUS 2 (TAT 6-24 HRS) Nasopharyngeal Nasopharyngeal Swab     Status: Abnormal   Collection Time: 07/02/20  4:35 PM   Specimen: Nasopharyngeal Swab  Result Value Ref Range Status   SARS Coronavirus 2 POSITIVE (A) NEGATIVE Final    Comment: (NOTE) SARS-CoV-2 target nucleic acids are DETECTED.  The SARS-CoV-2 RNA is generally detectable in upper and lower respiratory specimens during the acute phase of infection. Positive results are indicative of the presence of SARS-CoV-2 RNA. Clinical correlation with patient history and other diagnostic information is  necessary to determine patient infection status. Positive results do not rule out bacterial infection or co-infection with other viruses.  The expected result is Negative.  Fact Sheet for Patients: SugarRoll.be  Fact Sheet for Healthcare Providers: https://www.woods-mathews.com/  This test is not yet approved or cleared by the [place] FDA and  has been authorized for detection and/or diagnosis of SARS-CoV-2 by FDA under an Emergency Use Authorization (EUA). This EUA will remain  in effect (meaning this test can be used) for the duration of the COVID-19 declaration under Section 564(b)(1) of the Act, 21 U. S.C. section 360bbb-3(b)(1), unless the authorization is terminated or revoked sooner.   Performed at [facility], [place] [address] [place], [place] [address]   Blood Culture (routine x 2)     Status: None   Collection Time: 07/02/20  4:40 PM   Specimen: BLOOD RIGHT ARM  Result Value Ref Range Status   Specimen Description BLOOD RIGHT ARM  Final   Special Requests   Final    BOTTLES DRAWN AEROBIC AND ANAEROBIC Blood Culture adequate volume   Culture   Final    NO GROWTH 5 DAYS Performed at [facility], [place] [address] [place], [place] [address]    Report Status 07/07/2020 FINAL  Final         Radiology  Studies: CT HEAD WO CONTRAST  Result Date: 07/07/2020 CLINICAL DATA:  Neuro deficit, stroke suspected. EXAM: CT HEAD WITHOUT CONTRAST TECHNIQUE: Contiguous axial images were obtained from the base of the skull through the vertex without intravenous contrast. COMPARISON:  Multiple priors including most recent head CT July 03, 2020 FINDINGS: Brain: Similar severe burden of chronic ischemic white matter disease. Chronic lacunar type infarcts in the right cerebellar  hemisphere and right basal ganglia. No CT evidence of acute infarction. No evidence of parenchymal hemorrhage or extra-axial fluid collection. No mass lesion mass effect or midline shift. Mild age-appropriate volume loss. No hydrocephalus. Vascular: No hyperdense vessel or unexpected calcification. Atherosclerotic calcifications internal carotid arteries. Skull: Normal. Negative for fracture or focal lesion. Sinuses/Orbits: Unchanged mild mucoperiosteal thickening throughout the ethmoid air cells bilaterally. Remainder of the paranasal sinuses and mastoid air cells appear clear. Other: None. IMPRESSION: 1. No CT evidence of acute intracranial abnormality. 2. Similar severe burden of chronic ischemic white matter disease with chronic lacunar type infarcts in the right cerebellar hemisphere and right basal ganglia. Electronically Signed   By: Dahlia Bailiff MD   On: 07/07/2020 13:09   MR BRAIN WO CONTRAST  Result Date: 07/08/2020 CLINICAL DATA:  Mental status change with unknown cause EXAM: MRI HEAD WITHOUT CONTRAST TECHNIQUE: Multiplanar, multiecho pulse sequences of the brain and surrounding structures were obtained without intravenous contrast. COMPARISON:  Head CT from yesterday FINDINGS: Brain: No acute infarction, hemorrhage, hydrocephalus, extra-axial collection or mass lesion. Confluent chronic small vessel ischemia in the deep cerebral white matter with extensive ischemic gliosis and remote infarcts in the deep gray nuclei and deep white  matter tracks. Small remote bilateral inferior cerebellar infarcts. Multiple small remote hemorrhages in the brainstem and subcortical regions. Encephalomalacia in the inferior right frontal lobe, a location commonly attributed to prior trauma but unilateral and possibly post ischemic in this case. Vascular: Preserved flow voids Skull and upper cervical spine: Normal marrow signal Sinuses/Orbits: Negative IMPRESSION: 1. No acute finding. 2. Advanced and widespread chronic small vessel disease. Electronically Signed   By: Monte Fantasia M.D.   On: 07/08/2020 06:34   EEG adult  Result Date: 07/07/2020 Lora Havens, MD     07/07/2020  4:32 PM Patient Name: Peter Cox MRN: [id number] Epilepsy Attending: Lora Havens Referring Physician/Provider: Dr Berle Mull Date: 07/07/2020 Duration: 22.40 mins Patient history: 60yo M with seizure, ams.EEG to evaluate for seizure. Level of alertness: Awake AEDs during EEG study: VPA Technical aspects: This EEG study was done with scalp electrodes positioned according to the 10-20 International system of electrode placement. Electrical activity was acquired at a sampling rate of 500Hz  and reviewed with a high frequency filter of 70Hz  and a low frequency filter of 1Hz . EEG data were recorded continuously and digitally stored. Description: The posterior dominant rhythm consists of 8-9 Hz activity of moderate voltage (25-35 uV) seen predominantly in posterior head regions, symmetric and reactive to eye opening and eye closing. Hyperventilation and photic stimulation were not performed.   IMPRESSION: This study is within normal limits. No seizures or epileptiform discharges were seen throughout the recording. Priyanka O Yadav   VAS [name] LOWER EXTREMITY VENOUS (DVT)  Result Date: 07/07/2020  Lower Venous DVT Study Indications: Edema.  Comparison Study: no prior Performing Technologist: Abram Sander RVS  Examination Guidelines: A complete evaluation includes B-mode  imaging, spectral Doppler, color Doppler, and power Doppler as needed of all accessible portions of each vessel. Bilateral testing is considered an integral part of a complete examination. Limited examinations for reoccurring indications may be performed as noted. The reflux portion of the exam is performed with the patient in reverse Trendelenburg.  +---------+---------------+---------+-----------+----------+--------------+ RIGHT    CompressibilityPhasicitySpontaneityPropertiesThrombus Aging +---------+---------------+---------+-----------+----------+--------------+ CFV      Full           Yes      Yes                                 +---------+---------------+---------+-----------+----------+--------------+  SFJ      Full                                                        +---------+---------------+---------+-----------+----------+--------------+ FV Prox  Full                                                        +---------+---------------+---------+-----------+----------+--------------+ FV Mid   Full                                                        +---------+---------------+---------+-----------+----------+--------------+ FV DistalFull                                                        +---------+---------------+---------+-----------+----------+--------------+ PFV      Full                                                        +---------+---------------+---------+-----------+----------+--------------+ POP      Full           Yes      Yes                                 +---------+---------------+---------+-----------+----------+--------------+ PTV      Full                                                        +---------+---------------+---------+-----------+----------+--------------+ PERO     Full                                                        +---------+---------------+---------+-----------+----------+--------------+    +---------+---------------+---------+-----------+----------+--------------+ LEFT     CompressibilityPhasicitySpontaneityPropertiesThrombus Aging +---------+---------------+---------+-----------+----------+--------------+ CFV      Full           Yes      Yes                                 +---------+---------------+---------+-----------+----------+--------------+ SFJ      Full                                                        +---------+---------------+---------+-----------+----------+--------------+  FV Prox  Full                                                        +---------+---------------+---------+-----------+----------+--------------+ FV Mid   Full                                                        +---------+---------------+---------+-----------+----------+--------------+ FV DistalFull                                                        +---------+---------------+---------+-----------+----------+--------------+ PFV      Full                                                        +---------+---------------+---------+-----------+----------+--------------+ POP      Full           Yes      Yes                                 +---------+---------------+---------+-----------+----------+--------------+ PTV      Full                                                        +---------+---------------+---------+-----------+----------+--------------+ PERO     Full                                                        +---------+---------------+---------+-----------+----------+--------------+     Summary: BILATERAL: - No evidence of deep vein thrombosis seen in the lower extremities, bilaterally. - No evidence of superficial venous thrombosis in the lower extremities, bilaterally. -No evidence of popliteal cyst, bilaterally.   *See table(s) above for measurements and observations. Electronically signed by Deitra Mayo MD on  07/07/2020 at 4:08:40 PM.    Final         Scheduled Meds: . bictegravir-emtricitabine-tenofovir AF  1 tablet Oral Daily  . divalproex  250 mg Oral BID  . feeding supplement  237 mL Oral BID BM  . sodium chloride flush  3 mL Intravenous Q12H   Continuous Infusions: . dextrose 5% lactated ringers 100 mL/hr at 07/08/20 0634     LOS: 5 days   Time spent= 35 mins    Dejae Bernet Arsenio Loader, MD [facility]  If 7PM-7AM, please contact night-coverage  07/08/2020, 7:21 AM

## 2020-07-09 DIAGNOSIS — U071 COVID-19: Secondary | ICD-10-CM | POA: Diagnosis not present

## 2020-07-09 MED ORDER — GLYCOPYRROLATE 1 MG PO TABS: 1.0000 mg | ORAL_TABLET | ORAL | 0 refills | Status: DC | PRN

## 2020-07-09 MED ORDER — SENNOSIDES-DOCUSATE SODIUM 8.6-50 MG PO TABS: 1.0000 | ORAL_TABLET | Freq: Every evening | ORAL | Status: DC | PRN

## 2020-07-09 MED ORDER — MORPHINE SULFATE (CONCENTRATE) 10 MG/0.5ML PO SOLN: 2.6000 mg | ORAL | 0 refills | Status: DC | PRN

## 2020-07-09 MED ORDER — HALOPERIDOL 0.5 MG PO TABS: 0.5000 mg | ORAL_TABLET | ORAL | 0 refills | Status: DC | PRN

## 2020-07-09 MED ORDER — ONDANSETRON 4 MG PO TBDP: 4.0000 mg | ORAL_TABLET | Freq: Four times a day (QID) | ORAL | 0 refills | Status: DC | PRN

## 2020-07-09 MED ORDER — LORAZEPAM 1 MG PO TABS: 1.0000 mg | ORAL_TABLET | ORAL | 0 refills | Status: DC | PRN

## 2020-07-09 NOTE — Consult Note (Signed)
   [facility] Inpatient Consult   07/09/2020  PETERSON MATHEY 11-13-1960 [id number]  [facility] [[facility]] Patient: [facility]  Chart reviewed for LOS, disposition and reveals the patient is currently transitioning to Hospice/Palliative Care.   Patient is full comfort care per notes, will sign off at transition from the hospital.  Natividad Brood, RN BSN [facility] Liaison  [phone] business mobile phone Toll free office [phone]  Fax number: [phone] [email or web address]

## 2020-07-09 NOTE — Discharge Summary (Addendum)
Physician Discharge Summary  Peter Cox CHE:[id number] DOB: 1961/04/23 DOA: 07/02/2020  PCP: Peter Crews, MD  Admit date: 07/02/2020 Discharge date: 07/09/2020  Admitted From: [facility] Disposition: [facility] with hospice  Recommendations for Outpatient Follow-up:  1. Follow up with PCP in 1-2 weeks 2. Please obtain BMP/CBC in one week your next doctors visit.  3. Xanax, morphine, Robinul prescribed 4. Palliative care/hospice team to follow him at the facility 5. Okay to crush meds and given with applesauce  Discharge Condition: Stable CODE STATUS: DNR Diet recommendation: Comfort feeding as tolerated  Brief/Interim Summary: 60 year old with past medical history of CKD stage IIIb, HIV dementia, HLD, CVA, deconditioning, HLD, incontinent, history of alcohol use from assisted living facility admitted for fever and chills found to have COVID-19 pneumonia. Currently has waxing and waning mentation status most of the work-up is negative besides mildly elevated ammonia levels. Poor oral intake.  After extensive discussion with the patient's daughter with palliative care services, patient was transition to comfort care.  He will be discharged to [facility] with hospice care service to continue following him.   Assessment & Plan:   Principal Problem:   Acute encephalopathy Active Problems:   Human immunodeficiency virus (HIV) disease ([place])   HLD (hyperlipidemia)   Essential hypertension   CKD (chronic kidney disease), stage III (HCC)   Dementia without behavioral disturbance (HCC)   History of CVA (cerebrovascular accident)   Decreased activities of daily living (ADL)   Physical deconditioning   Acute metabolic encephalopathy  Acute COVID-19 pneumonia, mild-now asymptomatic doing well - Chest x-ray is overall clear. Currently on room air. Did not receive remdesivir, steroids, Actemra/baricitinib - Incentive spirometer and flutter valve.  Acute  metabolic encephalopathy History of HIV dementia with history of HIV Dysphagia - Mentation is waxing and waning. - Speech and swallow-dysphagia 1 diet, recommend comfort feeding - PT/OT-return to ALF - CT head, EEG-unremarkable - Depakote level/normal - MRI- chronic advanced small vessel disease. No acute findings -Continue Biktarvy -Lactulose twice daily as tolerated depending on his mentation  AKI on CKD stage IIIb - Prerenal from poor oral intake. Gentle hydration  Elevated D-dimer - Lower extremity Dopplers negative for DVT. Patient is currently not hypoxic. Not safe to obtain CTA PE due to renal dysfunction  Hyperbilirubinemia/transaminitis -Resolved. Closely monitor  Seizure disorder - Depakote levels normal.  Continue Depakote  Essential hypertension, uncontrolled - In acceptable range  History of CVA and HLD - Statin. Not on antiplatelet due to thrombocytopenia  Chronic thrombocytopenia - Closely monitor. No obvious evidence of bleeding  Goals of care Failure to thrive, adult -  Seen by palliative care services-patient has been transitioned to comfort care.  He will be discharged to [facility] with hospice care service to follow  Body mass index is 19.79 kg/m.         Discharge Diagnoses:  Principal Problem:   Acute encephalopathy Active Problems:   Human immunodeficiency virus (HIV) disease ([place])   HLD (hyperlipidemia)   Essential hypertension   CKD (chronic kidney disease), stage III (HCC)   Dementia without behavioral disturbance (HCC)   History of CVA (cerebrovascular accident)   Decreased activities of daily living (ADL)   Physical deconditioning   Acute metabolic encephalopathy    Subjective: Sitting comfortably on his bed watching TV no complaints.  No acute events overnight.  Discharge Exam: Vitals:   07/08/20 2043 07/09/20 0615  BP: (!) 150/97 140/90  Pulse: 74 75  Resp: 18  18  Temp: 97.9 F (36.6 C) 98 F (36.7  C)  SpO2: 100% 98%   Vitals:   07/08/20 0630 07/08/20 0752 07/08/20 2043 07/09/20 0615  BP: (!) 164/79 (!) 171/83 (!) 150/97 140/90  Pulse: 75 66 74 75  Resp: 16 16 18 18   Temp: 98.8 F (37.1 C) 98.7 F (37.1 C) 97.9 F (36.6 C) 98 F (36.7 C)  TempSrc: Oral Oral Oral Oral  SpO2: 100% 100% 100% 98%  Weight:      Height:        General: Pt is alert, awake, not in acute distress, elderly frail Cardiovascular: RRR, S1/S2 +, no rubs, no gallops Respiratory: CTA bilaterally, no wheezing, no rhonchi Abdominal: Soft, NT, ND, bowel sounds + Extremities: no edema, no cyanosis Overall has poor judgment  Discharge Instructions   Allergies as of 07/09/2020   No Known Allergies     Medication List    TAKE these medications   acetaminophen 500 MG tablet Commonly known as: TYLENOL Take 1 tablet (500 mg total) by mouth in the morning and at bedtime.   amLODipine 10 MG tablet Commonly known as: NORVASC Take 1 tablet (10 mg total) by mouth daily.   atorvastatin 20 MG tablet Commonly known as: LIPITOR TAKE 1 TABLET BY MOUTH ONCE A DAY   Biktarvy 50-200-25 MG Tabs tablet Generic drug: bictegravir-emtricitabine-tenofovir AF Take 1 tablet by mouth daily.   cyanocobalamin 100 MCG tablet Take 100 mcg by mouth daily.   divalproex 125 MG DR tablet Commonly known as: DEPAKOTE Take 250 mg by mouth 2 (two) times daily. Take 2 capsules by mouth twice daily.   donepezil 10 MG tablet Commonly known as: ARICEPT Take 1/2 tablet daily for 2 weeks, then increase to 1 tablet daily What changed:   how much to take  how to take this  when to take this  additional instructions   glycopyrrolate 1 MG tablet Commonly known as: ROBINUL Take 1 tablet (1 mg total) by mouth every 4 (four) hours as needed (excessive secretions).   haloperidol 0.5 MG tablet Commonly known as: HALDOL Take 1 tablet (0.5 mg total) by mouth every 4 (four) hours as needed for agitation (or delirium).    hydrALAZINE 25 MG tablet Commonly known as: APRESOLINE Take 1 tablet (25 mg total) by mouth 3 (three) times daily. What changed: when to take this   labetalol 100 MG tablet Commonly known as: NORMODYNE Take 1 tablet (100 mg total) by mouth 2 (two) times daily.   lisinopril 20 MG tablet Commonly known as: ZESTRIL Take 20 mg by mouth daily.   LORazepam 1 MG tablet Commonly known as: ATIVAN Take 1 tablet (1 mg total) by mouth every 4 (four) hours as needed for anxiety.   morphine CONCENTRATE 10 MG/0.5ML Soln concentrated solution Take 0.13-0.25 mLs (2.6-5 mg total) by mouth every 2 (two) hours as needed for moderate pain (or dyspnea, eol care).   OLANZapine 5 MG tablet Commonly known as: ZYPREXA Take 1 tablet (5 mg total) by mouth daily as needed (agitation). What changed: when to take this   ondansetron 4 MG disintegrating tablet Commonly known as: ZOFRAN-ODT Take 1 tablet (4 mg total) by mouth every 6 (six) hours as needed for nausea or vomiting.   senna-docusate 8.6-50 MG tablet Commonly known as: Senokot-S Take 1 tablet by mouth at bedtime as needed for moderate constipation.       Follow-up Information    Bacigalupo, Dionne Bucy, MD. Schedule an appointment as soon  as possible for a visit in 1 week(s).   Specialty: Family Medicine Contact information: [address] [place] [address] [place], Lavell Islam, MD .   Specialty: Infectious Diseases Contact information: [place]. [place] [address] [phone]              No Known Allergies  You were cared for by a hospitalist during your hospital stay. If you have any questions about your discharge medications or the care you received while you were in the hospital after you are discharged, you can call the unit and asked to speak with the hospitalist on call if the hospitalist that took care of you is not available. Once you are discharged, your primary care  physician will handle any further medical issues. Please note that no refills for any discharge medications will be authorized once you are discharged, as it is imperative that you return to your primary care physician (or establish a relationship with a primary care physician if you do not have one) for your aftercare needs so that they can reassess your need for medications and monitor your lab values.   Procedures/Studies: CT HEAD WO CONTRAST  Result Date: 07/07/2020 CLINICAL DATA:  Neuro deficit, stroke suspected. EXAM: CT HEAD WITHOUT CONTRAST TECHNIQUE: Contiguous axial images were obtained from the base of the skull through the vertex without intravenous contrast. COMPARISON:  Multiple priors including most recent head CT July 03, 2020 FINDINGS: Brain: Similar severe burden of chronic ischemic white matter disease. Chronic lacunar type infarcts in the right cerebellar hemisphere and right basal ganglia. No CT evidence of acute infarction. No evidence of parenchymal hemorrhage or extra-axial fluid collection. No mass lesion mass effect or midline shift. Mild age-appropriate volume loss. No hydrocephalus. Vascular: No hyperdense vessel or unexpected calcification. Atherosclerotic calcifications internal carotid arteries. Skull: Normal. Negative for fracture or focal lesion. Sinuses/Orbits: Unchanged mild mucoperiosteal thickening throughout the ethmoid air cells bilaterally. Remainder of the paranasal sinuses and mastoid air cells appear clear. Other: None. IMPRESSION: 1. No CT evidence of acute intracranial abnormality. 2. Similar severe burden of chronic ischemic white matter disease with chronic lacunar type infarcts in the right cerebellar hemisphere and right basal ganglia. Electronically Signed   By: Dahlia Bailiff MD   On: 07/07/2020 13:09   CT HEAD WO CONTRAST  Result Date: 07/03/2020 CLINICAL DATA:  COVID positive. Altered mental status. No reported injury. EXAM: CT HEAD WITHOUT CONTRAST  TECHNIQUE: Contiguous axial images were obtained from the base of the skull through the vertex without intravenous contrast. COMPARISON:  05/10/2019 head CT. FINDINGS: Brain: Chronic small right cerebellar hemisphere and right basal ganglia lacunes. No evidence of parenchymal hemorrhage or extra-axial fluid collection. No mass lesion, mass effect, or midline shift. No CT evidence of acute infarction. Nonspecific prominent subcortical and periventricular white matter hypodensity, most in keeping with chronic small vessel ischemic change. Cerebral volume is age appropriate. No ventriculomegaly. Vascular: No acute abnormality. Skull: No evidence of calvarial fracture. Sinuses/Orbits: Mild mucoperiosteal thickening throughout the ethmoidal air cells bilaterally. No fluid levels. Other:  The mastoid air cells are unopacified. IMPRESSION: 1. No evidence of acute intracranial abnormality. 2. Prominent chronic small vessel ischemic changes in the cerebral white matter. 3. Small chronic right cerebellar hemisphere and right basal ganglia lacune is. 4. Mild chronic appearing paranasal sinusitis. Electronically Signed   By: Ilona Sorrel M.D.   On: 07/03/2020 08:49   MR BRAIN WO CONTRAST  Result Date: 07/08/2020 CLINICAL DATA:  Mental status change with unknown cause EXAM: MRI HEAD WITHOUT CONTRAST TECHNIQUE: Multiplanar, multiecho pulse sequences of the brain and surrounding structures were obtained without intravenous contrast. COMPARISON:  Head CT from yesterday FINDINGS: Brain: No acute infarction, hemorrhage, hydrocephalus, extra-axial collection or mass lesion. Confluent chronic small vessel ischemia in the deep cerebral white matter with extensive ischemic gliosis and remote infarcts in the deep gray nuclei and deep white matter tracks. Small remote bilateral inferior cerebellar infarcts. Multiple small remote hemorrhages in the brainstem and subcortical regions. Encephalomalacia in the inferior right frontal lobe, a  location commonly attributed to prior trauma but unilateral and possibly post ischemic in this case. Vascular: Preserved flow voids Skull and upper cervical spine: Normal marrow signal Sinuses/Orbits: Negative IMPRESSION: 1. No acute finding. 2. Advanced and widespread chronic small vessel disease. Electronically Signed   By: Monte Fantasia M.D.   On: 07/08/2020 06:34   DG Chest Port 1 View  Result Date: 07/02/2020 CLINICAL DATA:  Possible sepsis EXAM: PORTABLE CHEST 1 VIEW COMPARISON:  07/22/2019 FINDINGS: No consolidation or edema. No pleural effusion. Cardiomediastinal contours are within normal limits. Degenerative changes at the left glenohumeral joint with postsurgical changes. IMPRESSION: No acute process in the chest. Electronically Signed   By: Macy Mis M.D.   On: 07/02/2020 16:49   EEG adult  Result Date: 07/07/2020 Lora Havens, MD     07/07/2020  4:32 PM Patient Name: Peter Cox MRN: [id number] Epilepsy Attending: Lora Havens Referring Physician/Provider: Dr Berle Mull Date: 07/07/2020 Duration: 22.40 mins Patient history: 60yo M with seizure, ams.EEG to evaluate for seizure. Level of alertness: Awake AEDs during EEG study: VPA Technical aspects: This EEG study was done with scalp electrodes positioned according to the 10-20 International system of electrode placement. Electrical activity was acquired at a sampling rate of 500Hz  and reviewed with a high frequency filter of 70Hz  and a low frequency filter of 1Hz . EEG data were recorded continuously and digitally stored. Description: The posterior dominant rhythm consists of 8-9 Hz activity of moderate voltage (25-35 uV) seen predominantly in posterior head regions, symmetric and reactive to eye opening and eye closing. Hyperventilation and photic stimulation were not performed.   IMPRESSION: This study is within normal limits. No seizures or epileptiform discharges were seen throughout the recording. Priyanka O Yadav   VAS [name]  LOWER EXTREMITY VENOUS (DVT)  Result Date: 07/07/2020  Lower Venous DVT Study Indications: Edema.  Comparison Study: no prior Performing Technologist: Abram Sander RVS  Examination Guidelines: A complete evaluation includes B-mode imaging, spectral Doppler, color Doppler, and power Doppler as needed of all accessible portions of each vessel. Bilateral testing is considered an integral part of a complete examination. Limited examinations for reoccurring indications may be performed as noted. The reflux portion of the exam is performed with the patient in reverse Trendelenburg.  +---------+---------------+---------+-----------+----------+--------------+ RIGHT    CompressibilityPhasicitySpontaneityPropertiesThrombus Aging +---------+---------------+---------+-----------+----------+--------------+ CFV      Full           Yes      Yes                                 +---------+---------------+---------+-----------+----------+--------------+ SFJ      Full                                                        +---------+---------------+---------+-----------+----------+--------------+  FV Prox  Full                                                        +---------+---------------+---------+-----------+----------+--------------+ FV Mid   Full                                                        +---------+---------------+---------+-----------+----------+--------------+ FV DistalFull                                                        +---------+---------------+---------+-----------+----------+--------------+ PFV      Full                                                        +---------+---------------+---------+-----------+----------+--------------+ POP      Full           Yes      Yes                                 +---------+---------------+---------+-----------+----------+--------------+ PTV      Full                                                         +---------+---------------+---------+-----------+----------+--------------+ PERO     Full                                                        +---------+---------------+---------+-----------+----------+--------------+   +---------+---------------+---------+-----------+----------+--------------+ LEFT     CompressibilityPhasicitySpontaneityPropertiesThrombus Aging +---------+---------------+---------+-----------+----------+--------------+ CFV      Full           Yes      Yes                                 +---------+---------------+---------+-----------+----------+--------------+ SFJ      Full                                                        +---------+---------------+---------+-----------+----------+--------------+ FV Prox  Full                                                        +---------+---------------+---------+-----------+----------+--------------+  FV Mid   Full                                                        +---------+---------------+---------+-----------+----------+--------------+ FV DistalFull                                                        +---------+---------------+---------+-----------+----------+--------------+ PFV      Full                                                        +---------+---------------+---------+-----------+----------+--------------+ POP      Full           Yes      Yes                                 +---------+---------------+---------+-----------+----------+--------------+ PTV      Full                                                        +---------+---------------+---------+-----------+----------+--------------+ PERO     Full                                                        +---------+---------------+---------+-----------+----------+--------------+     Summary: BILATERAL: - No evidence of deep vein thrombosis seen in the lower extremities, bilaterally. - No evidence  of superficial venous thrombosis in the lower extremities, bilaterally. -No evidence of popliteal cyst, bilaterally.   *See table(s) above for measurements and observations. Electronically signed by Deitra Mayo MD on 07/07/2020 at 4:08:40 PM.    Final       The results of significant diagnostics from this hospitalization (including imaging, microbiology, ancillary and laboratory) are listed below for reference.     Microbiology: Recent Results (from the past 240 hour(s))  Blood Culture (routine x 2)     Status: None   Collection Time: 07/02/20  4:35 PM   Specimen: BLOOD RIGHT ARM  Result Value Ref Range Status   Specimen Description BLOOD RIGHT ARM  Final   Special Requests   Final    BOTTLES DRAWN AEROBIC AND ANAEROBIC Blood Culture adequate volume   Culture   Final    NO GROWTH 5 DAYS Performed at [facility], [address], [place], [place] [address]    Report Status 07/07/2020 FINAL  Final  Urine culture     Status: Abnormal   Collection Time: 07/02/20  4:35 PM   Specimen: In/Out Cath Urine  Result Value Ref Range Status   Specimen Description IN/OUT CATH URINE  Final   Special Requests  Final    NONE Performed at [facility], [place] [address] [place], [place] [address]    Culture MULTIPLE SPECIES PRESENT, SUGGEST RECOLLECTION (A)  Final   Report Status 07/04/2020 FINAL  Final  SARS CORONAVIRUS 2 (TAT 6-24 HRS) Nasopharyngeal Nasopharyngeal Swab     Status: Abnormal   Collection Time: 07/02/20  4:35 PM   Specimen: Nasopharyngeal Swab  Result Value Ref Range Status   SARS Coronavirus 2 POSITIVE (A) NEGATIVE Final    Comment: (NOTE) SARS-CoV-2 target nucleic acids are DETECTED.  The SARS-CoV-2 RNA is generally detectable in upper and lower respiratory specimens during the acute phase of infection. Positive results are indicative of the presence of SARS-CoV-2 RNA. Clinical correlation with patient history and other diagnostic information is   necessary to determine patient infection status. Positive results do not rule out bacterial infection or co-infection with other viruses.  The expected result is Negative.  Fact Sheet for Patients: SugarRoll.be  Fact Sheet for Healthcare Providers: https://www.woods-mathews.com/  This test is not yet approved or cleared by the [place] FDA and  has been authorized for detection and/or diagnosis of SARS-CoV-2 by FDA under an Emergency Use Authorization (EUA). This EUA will remain  in effect (meaning this test can be used) for the duration of the COVID-19 declaration under Section 564(b)(1) of the Act, 21 U. S.C. section 360bbb-3(b)(1), unless the authorization is terminated or revoked sooner.   Performed at [facility], [place] [address], [place], [place] [address]   Blood Culture (routine x 2)     Status: None   Collection Time: 07/02/20  4:40 PM   Specimen: BLOOD RIGHT ARM  Result Value Ref Range Status   Specimen Description BLOOD RIGHT ARM  Final   Special Requests   Final    BOTTLES DRAWN AEROBIC AND ANAEROBIC Blood Culture adequate volume   Culture   Final    NO GROWTH 5 DAYS Performed at [facility], [place] [address], [place],  [address]    Report Status 07/07/2020 FINAL  Final     Labs: BNP (last 3 results) No results for input(s): BNP in the last 8760 hours. Basic Metabolic Panel: Recent Labs  Lab 07/03/20 0415 07/05/20 0708 07/06/20 0706 07/07/20 0408 07/08/20 0701  NA 138 141 143 141 143  K 5.6* 4.1 4.0 4.3 3.8  CL 105 107 107 107 109  CO2 22 22 23 22 25   GLUCOSE 74 96 90 93 96  BUN 23 32* 35* 44* 32*  CREATININE 2.36* 2.39* 2.76* 3.20* 2.37*  CALCIUM 8.5* 8.5* 8.9 8.7* 8.4*  MG  --  2.0  --  2.2 1.9   Liver Function Tests: Recent Labs  Lab 07/03/20 0415 07/05/20 0708 07/06/20 0706 07/07/20 0408 07/08/20 0701  AST 56* 18 18 18 18   ALT 13 14 15 14 14   ALKPHOS 55 56 55 55 50   BILITOT 1.5* 0.6 0.8 0.7 0.7  PROT 6.3* 6.5 7.0 6.8 6.2*  ALBUMIN 3.4* 3.4* 3.5 3.4* 3.1*   No results for input(s): LIPASE, AMYLASE in the last 168 hours. Recent Labs  Lab 07/07/20 1430  AMMONIA 40*   CBC: Recent Labs  Lab 07/02/20 1635 07/03/20 0415 07/05/20 0708 07/06/20 0706 07/07/20 0408 07/08/20 0701  WBC 8.2 5.9 3.3* 2.5* 2.7* 3.2*  NEUTROABS 6.0  --  2.1 1.2*  --   --   HGB 14.1 12.7* 12.7* 13.7 12.9* 12.9*  HCT 41.5 38.6* 38.3* 41.3 37.4* 39.3  MCV 95.8 96.0 95.0 95.4 94.2 95.2  PLT 122* 109* 104* 97* 98* 95*   Cardiac Enzymes: No results for input(s): CKTOTAL, CKMB, CKMBINDEX, TROPONINI in the last 168 hours. BNP: Invalid input(s): POCBNP CBG: Recent Labs  Lab 07/07/20 1217  GLUCAP 132*   D-Dimer Recent Labs    07/07/20 0408 07/08/20 0701  DDIMER 10.59* 1.40*   Hgb A1c No results for input(s): HGBA1C in the last 72 hours. Lipid Profile No results for input(s): CHOL, HDL, LDLCALC, TRIG, CHOLHDL, LDLDIRECT in the last 72 hours. Thyroid function studies No results for input(s): TSH, T4TOTAL, T3FREE, THYROIDAB in the last 72 hours.  Invalid input(s): FREET3 Anemia work up No results for input(s): VITAMINB12, FOLATE, FERRITIN, TIBC, IRON, RETICCTPCT in the last 72 hours. Urinalysis    Component Value Date/Time   COLORURINE YELLOW 07/02/2020 1635   APPEARANCEUR HAZY (A) 07/02/2020 1635   LABSPEC 1.020 07/02/2020 1635   PHURINE 5.0 07/02/2020 1635   GLUCOSEU NEGATIVE 07/02/2020 1635   GLUCOSEU NEG mg/dL 08/28/2008 2107   HGBUR SMALL (A) 07/02/2020 1635   HGBUR large 06/24/2008 0948   BILIRUBINUR NEGATIVE 07/02/2020 1635   KETONESUR NEGATIVE 07/02/2020 1635   PROTEINUR 100 (A) 07/02/2020 1635   UROBILINOGEN 0.2 12/07/2012 1042   NITRITE NEGATIVE 07/02/2020 1635   LEUKOCYTESUR NEGATIVE 07/02/2020 1635   Sepsis Labs Invalid input(s): PROCALCITONIN,  WBC,  LACTICIDVEN Microbiology Recent Results (from the past 240 hour(s))  Blood Culture  (routine x 2)     Status: None   Collection Time: 07/02/20  4:35 PM   Specimen: BLOOD RIGHT ARM  Result Value Ref Range Status   Specimen Description BLOOD RIGHT ARM  Final   Special Requests   Final    BOTTLES DRAWN AEROBIC AND ANAEROBIC Blood Culture adequate volume   Culture   Final    NO GROWTH 5 DAYS Performed at [facility], [place] [address], [place], [place] [address]    Report Status 07/07/2020 FINAL  Final  Urine culture     Status: Abnormal   Collection Time: 07/02/20  4:35 PM   Specimen: In/Out Cath Urine  Result Value Ref Range Status   Specimen Description IN/OUT CATH URINE  Final   Special Requests   Final    NONE Performed at [facility], [place] [address] [place], [place] [address]    Culture MULTIPLE SPECIES PRESENT, SUGGEST RECOLLECTION (A)  Final   Report Status 07/04/2020 FINAL  Final  SARS CORONAVIRUS 2 (TAT 6-24 HRS) Nasopharyngeal Nasopharyngeal Swab     Status: Abnormal   Collection Time: 07/02/20  4:35 PM   Specimen: Nasopharyngeal Swab  Result Value Ref Range Status   SARS Coronavirus 2 POSITIVE (A) NEGATIVE Final    Comment: (NOTE) SARS-CoV-2 target nucleic acids are DETECTED.  The SARS-CoV-2 RNA is generally detectable in upper and lower respiratory specimens during the acute phase of infection. Positive results are indicative of the presence of SARS-CoV-2 RNA. Clinical correlation with patient history and other diagnostic information is  necessary to determine patient infection status. Positive results do not rule out bacterial infection or co-infection with other viruses.  The expected result is Negative.  Fact Sheet for Patients: SugarRoll.be  Fact Sheet for Healthcare Providers: https://www.woods-mathews.com/  This test is not yet approved or cleared by the [place] FDA and  has been authorized for detection and/or diagnosis of SARS-CoV-2 by FDA under an Emergency Use Authorization  (EUA). This EUA will remain  in effect (meaning this test  can be used) for the duration of the COVID-19 declaration under Section 564(b)(1) of the Act, 21 U. S.C. section 360bbb-3(b)(1), unless the authorization is terminated or revoked sooner.   Performed at [facility], [place] [address], [place], [place] [address]   Blood Culture (routine x 2)     Status: None   Collection Time: 07/02/20  4:40 PM   Specimen: BLOOD RIGHT ARM  Result Value Ref Range Status   Specimen Description BLOOD RIGHT ARM  Final   Special Requests   Final    BOTTLES DRAWN AEROBIC AND ANAEROBIC Blood Culture adequate volume   Culture   Final    NO GROWTH 5 DAYS Performed at [facility], [place] [address], [place], [place] [address]    Report Status 07/07/2020 FINAL  Final     Time coordinating discharge:  I have spent 35 minutes face to face with the patient and on the ward discussing the patients care, assessment, plan and disposition with other care givers. >50% of the time was devoted counseling the patient about the risks and benefits of treatment/Discharge disposition and coordinating care.   SIGNED:   Damita Lack, MD  [facility] 07/09/2020, 11:03 AM   If 7PM-7AM, please contact night-coverage

## 2020-07-09 NOTE — TOC Progression Note (Addendum)
Transition of Care [facility]) - Progression Note    Patient Details  Name: Peter Cox MRN: [id number] Date of Birth: 05/18/1961  Transition of Care [facility]) CM/SW [place], RN Phone Number: 07/09/2020, 8:59 AM  Clinical Narrative:    CM called [place] and spoke with Santiago Glad who confirms that patient can return with comfort/ Hospice care. Santiago Glad will notify [facility] and call CM back with details of patients return. MD has been updated.  1310 CM called [facility] to follow up to determine is facility will be able to take patient back today. Message has been left for Santiago Glad. Will await return call.   1535 CM attempted to call [facility]. Levada Dy has answered the phone and states that she will definitely give Santiago Glad the message. CM is unable to discharge patient to facility without final approval. Will await return call.        Expected Discharge Plan and Services           Expected Discharge Date: 07/09/20                                     Social Determinants of Health (SDOH) Interventions    Readmission Risk Interventions No flowsheet data found.

## 2020-07-09 NOTE — Progress Notes (Signed)
Palliative: Peter Cox is full comfort care.  He is to be transferred back to his ALF with hospice care today. DNR/goldenrod form completed.  Conference with attending, bedside nursing staff, transition of care team related to patient condition, needs, goals of care, disposition.  Plan: Full comfort care, return to ALF with hospice care. Prognosis: 2 weeks or less expected.  No charge Quinn Axe, NP Palliative medicine team Team phone [phone] Greater than 50% of this time was spent counseling and coordinating care related to the above assessment and plan.

## 2020-07-10 ENCOUNTER — Telehealth: Payer: Self-pay | Admitting: Family Medicine

## 2020-07-10 ENCOUNTER — Telehealth: Payer: Self-pay

## 2020-07-10 NOTE — Telephone Encounter (Signed)
Please advise 

## 2020-07-10 NOTE — Telephone Encounter (Signed)
Yes happy to be MD on record for hospice care. Usually they fax an order, which I can sign when back in the office. Do they need a referral?

## 2020-07-10 NOTE — Telephone Encounter (Signed)
Copied from [place] [phone]. Topic: General - Other >> Jul 10, 2020 11:33 AM Yvette Rack wrote: Reason for CRM: Santiago Glad with [facility] requests an order for Hospice as patient health has started to decline. Santiago Glad also requests that the order be for [facility] in [place]. Cb# [phone]   fax# [phone]

## 2020-07-10 NOTE — Telephone Encounter (Signed)
Dr. Mariann Barter is out of office can you please review when you return back to work 07/13/20. Thanks. KW

## 2020-07-10 NOTE — Telephone Encounter (Signed)
Peter Cox with [facility] called saying pt is going to be in Hospice care at [facility] in [place] and they want to know if Dr. B can sign an order for hospice eval..  CB#  [phone]   Fax [phone]

## 2020-07-10 NOTE — Telephone Encounter (Signed)
Noted thank you

## 2020-07-10 NOTE — Telephone Encounter (Signed)
Peter Cox with [facility] will call when for orders when patient is discharged from hospital for initial evaluation. [facility] aware.

## 2020-07-10 NOTE — Telephone Encounter (Signed)
Per [facility] patient is still admitted to [facility]. They will call to request an order for an initial evaluation.

## 2020-07-10 NOTE — Telephone Encounter (Signed)
Patient has been discharged, requesting update on medications. Do they continue his daily medications or just the ones for comfort. Please advise.

## 2020-07-10 NOTE — TOC Transition Note (Signed)
Transition of Care [facility]) - CM/SW Discharge Note   Patient Details  Name: RUVIM RISKO MRN: [id number] Date of Birth: 1960/06/25  Transition of Care [facility]) CM/SW Contact:  Angelita Ingles, RN Phone Number: [phone]  07/10/2020, 10:10 AM   Clinical Narrative:    CM spoke with Santiago Glad at [facility] who confirms that patient is ok to return to facility. Santiago Glad is requesting copy of Hospice order. CM has put copy in discharge packet. Daughter has been made aware of discharge. Bedside nurse made aware of discharge. Transportation has been set up with [facility]. No other needs noted at this time. TOC will sign off.  Please call report to [facility] [phone] Santiago Glad) Room A1    Final next level of care: Assisted Living Barriers to Discharge: No Barriers Identified   Patient Goals and CMS Choice        Discharge Placement              Patient chooses bed at:  [facility]) Patient to be transferred to facility by: Iraan Name of family member notified: Maryelizabeth Kaufmann    Discharge Plan and Services                DME Arranged: N/A DME Agency: NA       HH Arranged: NA          Social Determinants of Health (SDOH) Interventions     Readmission Risk Interventions No flowsheet data found.

## 2020-07-10 NOTE — Progress Notes (Signed)
Patient doing okay, discharge summary updated.  Stable for discharge to his facility with hospice. Discussed with patient and RN and TOC.  Vital signs are stable.  Gerlean Ren MD [facility]

## 2020-07-10 NOTE — Telephone Encounter (Signed)
Please review. KW 

## 2020-07-10 NOTE — Telephone Encounter (Signed)
FYI

## 2020-07-10 NOTE — Telephone Encounter (Signed)
07/10/20 2:39 pm.Please see previous telephone, Dr. B was ok with hospice order if needed. Ok to place referral to hospice care.

## 2020-07-13 ENCOUNTER — Telehealth: Payer: Self-pay

## 2020-07-13 NOTE — Telephone Encounter (Signed)
Continuing only comfort meds is reasonable and what I would recommend. Would check with ID about HAART. Definitely stop Aricept and statin.

## 2020-07-13 NOTE — Telephone Encounter (Signed)
Patient was recently discharged from the hospital on 07/09/20.  No TCM completed, patient does not qualify for TCM services due to being d/c to a SNF/hospice.  Per discharge summary patient needs follow up with PCP. No HFU schedule at this time. FYI!

## 2020-07-14 NOTE — Telephone Encounter (Signed)
Santiago Glad advised as below.

## 2020-07-16 ENCOUNTER — Telehealth: Payer: Self-pay

## 2020-07-16 NOTE — Telephone Encounter (Signed)
Copied from [place] [phone]. Topic: General - Other >> Jul 16, 2020 11:09 AM Peter Cox wrote: Reason for CRM: [facility] called and needs an signature for an order faxed back over immediately for Hospice Care/ fax to [phone]

## 2020-07-16 NOTE — Telephone Encounter (Signed)
Can another provider sign for it today? I won't be back in the office in person until Monday

## 2020-07-17 DIAGNOSIS — Z03818 Encounter for observation for suspected exposure to other biological agents ruled out: Secondary | ICD-10-CM | POA: Diagnosis not present

## 2020-07-17 NOTE — Telephone Encounter (Signed)
Gave to [employer]

## 2020-07-17 NOTE — Telephone Encounter (Signed)
Dr Caryn Section has signed order and order has been sent

## 2020-07-24 DIAGNOSIS — Z03818 Encounter for observation for suspected exposure to other biological agents ruled out: Secondary | ICD-10-CM | POA: Diagnosis not present

## 2020-08-03 DIAGNOSIS — Z03818 Encounter for observation for suspected exposure to other biological agents ruled out: Secondary | ICD-10-CM | POA: Diagnosis not present

## 2020-08-07 DIAGNOSIS — R32 Unspecified urinary incontinence: Secondary | ICD-10-CM | POA: Diagnosis not present

## 2020-08-21 ENCOUNTER — Other Ambulatory Visit (HOSPITAL_COMMUNITY): Payer: Self-pay | Admitting: Family Medicine

## 2020-08-21 ENCOUNTER — Telehealth: Payer: Self-pay

## 2020-08-21 ENCOUNTER — Telehealth: Payer: Self-pay | Admitting: Family Medicine

## 2020-08-21 DIAGNOSIS — B20 Human immunodeficiency virus [HIV] disease: Secondary | ICD-10-CM

## 2020-08-21 MED ORDER — LABETALOL HCL 100 MG PO TABS: 100.0000 mg | ORAL_TABLET | Freq: Two times a day (BID) | ORAL | 1 refills | Status: DC

## 2020-08-21 MED ORDER — ATORVASTATIN CALCIUM 20 MG PO TABS: 20.0000 mg | ORAL_TABLET | Freq: Every day | ORAL | 1 refills | Status: DC

## 2020-08-21 NOTE — Telephone Encounter (Signed)
Patient was discharged from hospice without any medication. Caller would like PCP to refill all prescriptions but was unable to specify the names of the medication. Caller stated all meds are listed in his chart and please send to    [facility], [place] Phone:  [phone]  Fax:  [phone]

## 2020-08-21 NOTE — Telephone Encounter (Signed)
Patient daughter called stating patient is off of Hospice and will be D/C from facility today. The social worker at [employer] [facility] said patient needs to call and get all med refills started again since going home. Advised I will call them back when I know if and when he needs to come in for a visit. Patient is back to his old self and doing great according to the daughter. She also has reached out to other doctors who write meds for this patient.

## 2020-08-24 DIAGNOSIS — Z03818 Encounter for observation for suspected exposure to other biological agents ruled out: Secondary | ICD-10-CM | POA: Diagnosis not present

## 2020-08-27 ENCOUNTER — Other Ambulatory Visit (HOSPITAL_COMMUNITY): Payer: Self-pay | Admitting: Family

## 2020-08-27 MED ORDER — BIKTARVY 50-200-25 MG PO TABS: 1.0000 | ORAL_TABLET | Freq: Every day | ORAL | 2 refills | Status: DC

## 2020-08-28 NOTE — Telephone Encounter (Signed)
Biktarvy refills were sent in and I left VM with patient regarding those meds. PCPwas also notified for other refills as noted in chart. [facility] visit in May 2022 is on the books as well with [place] at [facility].

## 2020-09-01 ENCOUNTER — Ambulatory Visit: Payer: Medicare HMO | Admitting: Family Medicine

## 2020-09-04 NOTE — Patient Instructions (Addendum)

## 2020-09-04 NOTE — Progress Notes (Signed)
Established patient visit   Patient: Peter Cox   DOB: 07-Mar-1961   60 y.o. Male  MRN: [id number] Visit Date: 09/07/2020  Today's healthcare provider: Lavon Paganini, MD   Chief Complaint  Patient presents with  . Follow-up  . Hypertension  . Hyperlipidemia   Subjective    HPI  Follow up for [facility] DISCHARGE  The patient was last seen for this 3 weeks ago. Changes made at last visit include per daughter, patient was taken of all of his regular medication while he was at hospice. She reports he has been off medications for about 3 weeks.   He has dry skin and long thick toenails.  Would like HH to come back out  Never got swallow study for dysphagia  Needs to reestablish with nephrology -----------------------------------------------------------------------------------------   Patient Active Problem List   Diagnosis Date Noted  . Dysphagia 02/06/2020  . Physical deconditioning 02/06/2020  . History of substance abuse ([place]) 09/17/2018  . Urinary incontinence 09/17/2018  . Bilateral primary osteoarthritis of knee 09/13/2018  . Decreased activities of daily living (ADL) 09/13/2018  . Alcohol abuse 11/24/2014  . Dementia without behavioral disturbance ([place]) 07/02/2014  . History of CVA (cerebrovascular accident) 07/02/2014  . CKD (chronic kidney disease), stage III ([place]) 11/20/2008  . Human immunodeficiency virus (HIV) disease ([place]) 08/17/2008  . HLD (hyperlipidemia) 06/26/2008  . Essential hypertension 06/24/2008   Social History   Tobacco Use  . Smoking status: Current Every Day Smoker    Packs/day: 0.50    Years: 40.00    Pack years: 20.00    Types: Cigarettes  . Smokeless tobacco: Never Used  . Tobacco comment: Quit  x 2 week.  Vaping Use  . Vaping Use: Never used  Substance Use Topics  . Alcohol use: Not Currently    Alcohol/week: 0.0 standard drinks    Comment: drink beer  . Drug use: Not Currently    Frequency: 7.0 times per week     Types: Marijuana    Comment: use Marijuana every day per pt.   No Known Allergies     Medications: Outpatient Medications Prior to Visit  Medication Sig  . bictegravir-emtricitabine-tenofovir AF (BIKTARVY) 50-200-25 MG TABS tablet Take 1 tablet by mouth daily. (Patient not taking: Reported on 09/07/2020)  . cyanocobalamin 100 MCG tablet Take 100 mcg by mouth daily. (Patient not taking: Reported on 09/07/2020)  . [DISCONTINUED] acetaminophen (TYLENOL) 500 MG tablet Take 1 tablet (500 mg total) by mouth in the morning and at bedtime. (Patient not taking: Reported on 09/07/2020)  . [DISCONTINUED] amLODipine (NORVASC) 10 MG tablet Take 1 tablet (10 mg total) by mouth daily. (Patient not taking: Reported on 09/07/2020)  . [DISCONTINUED] atorvastatin (LIPITOR) 20 MG tablet Take 1 tablet (20 mg total) by mouth daily. (Patient not taking: Reported on 09/07/2020)  . [DISCONTINUED] divalproex (DEPAKOTE) 125 MG DR tablet Take 250 mg by mouth 2 (two) times daily. Take 2 capsules by mouth twice daily. (Patient not taking: Reported on 09/07/2020)  . [DISCONTINUED] donepezil (ARICEPT) 10 MG tablet Take 1/2 tablet daily for 2 weeks, then increase to 1 tablet daily (Patient not taking: Reported on 09/07/2020)  . [DISCONTINUED] glycopyrrolate (ROBINUL) 1 MG tablet Take 1 tablet (1 mg total) by mouth every 4 (four) hours as needed (excessive secretions). (Patient not taking: Reported on 09/07/2020)  . [DISCONTINUED] haloperidol (HALDOL) 0.5 MG tablet Take 1 tablet (0.5 mg total) by mouth every 4 (four) hours as needed for agitation (or delirium). (Patient  not taking: Reported on 09/07/2020)  . [DISCONTINUED] hydrALAZINE (APRESOLINE) 25 MG tablet Take 1 tablet (25 mg total) by mouth 3 (three) times daily. (Patient not taking: Reported on 09/07/2020)  . [DISCONTINUED] labetalol (NORMODYNE) 100 MG tablet Take 1 tablet (100 mg total) by mouth 2 (two) times daily. (Patient not taking: Reported on 09/07/2020)  . [DISCONTINUED]  lisinopril (ZESTRIL) 20 MG tablet Take 20 mg by mouth daily. (Patient not taking: Reported on 09/07/2020)  . [DISCONTINUED] LORazepam (ATIVAN) 1 MG tablet Take 1 tablet (1 mg total) by mouth every 4 (four) hours as needed for anxiety. (Patient not taking: Reported on 09/07/2020)  . [DISCONTINUED] Morphine Sulfate (MORPHINE CONCENTRATE) 10 MG/0.5ML SOLN concentrated solution Take 0.13-0.25 mLs (2.6-5 mg total) by mouth every 2 (two) hours as needed for moderate pain (or dyspnea, eol care). (Patient not taking: Reported on 09/07/2020)  . [DISCONTINUED] OLANZapine (ZYPREXA) 5 MG tablet Take 1 tablet (5 mg total) by mouth daily as needed (agitation). (Patient not taking: Reported on 09/07/2020)  . [DISCONTINUED] ondansetron (ZOFRAN-ODT) 4 MG disintegrating tablet Take 1 tablet (4 mg total) by mouth every 6 (six) hours as needed for nausea or vomiting. (Patient not taking: Reported on 09/07/2020)  . [DISCONTINUED] senna-docusate (SENOKOT-S) 8.6-50 MG tablet Take 1 tablet by mouth at bedtime as needed for moderate constipation. (Patient not taking: Reported on 09/07/2020)   No facility-administered medications prior to visit.    Review of Systems  Constitutional: Negative for activity change, appetite change, chills, fatigue and fever.  Respiratory: Negative for cough, chest tightness, shortness of breath and wheezing.   Cardiovascular: Negative for chest pain, palpitations and leg swelling.    Last CBC Lab Results  Component Value Date   WBC 3.2 (L) 07/08/2020   HGB 12.9 (L) 07/08/2020   HCT 39.3 07/08/2020   MCV 95.2 07/08/2020   MCH 31.2 07/08/2020   RDW 12.6 07/08/2020   PLT 95 (L) 07/08/2020   Last thyroid functions Lab Results  Component Value Date   TSH 0.734 07/02/2020   Last vitamin B12 and Folate Lab Results  Component Value Date   VITAMINB12 548 10/06/2014        Objective    BP (!) 200/116 (BP Location: Right Arm, Patient Position: Sitting, Cuff Size: Normal)   Pulse 76    Temp 98.6 F (37 C) (Oral)   Resp 16   Wt 131 lb (59.4 kg)   SpO2 99%   BMI 19.35 kg/m  BP Readings from Last 3 Encounters:  09/07/20 (!) 200/116  07/09/20 140/90  05/04/20 126/87   Wt Readings from Last 3 Encounters:  09/07/20 131 lb (59.4 kg)  07/02/20 134 lb 0.6 oz (60.8 kg)  05/04/20 134 lb (60.8 kg)      Physical Exam Vitals reviewed.  Constitutional:      General: He is not in acute distress.    Appearance: Normal appearance. He is not diaphoretic.  HENT:     Head: Normocephalic and atraumatic.  Eyes:     General: No scleral icterus.    Conjunctiva/sclera: Conjunctivae normal.  Cardiovascular:     Rate and Rhythm: Normal rate and regular rhythm.     Pulses: Normal pulses.     Heart sounds: Normal heart sounds. No murmur heard.   Pulmonary:     Effort: Pulmonary effort is normal. No respiratory distress.     Breath sounds: Normal breath sounds. No wheezing or rhonchi.  Abdominal:     General: There is no distension.  Palpations: Abdomen is soft.     Tenderness: There is no abdominal tenderness.  Musculoskeletal:     Cervical back: Neck supple.     Right lower leg: No edema.     Left lower leg: No edema.  Lymphadenopathy:     Cervical: No cervical adenopathy.  Skin:    General: Skin is warm and dry.     Capillary Refill: Capillary refill takes less than 2 seconds.     Findings: No rash.     Comments: Dry and flaky skin of bilateral LEs  Neurological:     Mental Status: He is alert. Mental status is at baseline.       No results found for any visits on 09/07/20.  Assessment & Plan     Problem List Items Addressed This Visit      Cardiovascular and Mediastinum   Essential hypertension - Primary (Chronic)    Blood pressure very uncontrolled today, but asymptomatic He has been off of all of his medications for 3 weeks, which is likely contributing Resume previous medications Follow-up in 1 month and recheck metabolic panel at that time and  consider further dose titration of antihypertensives      Relevant Medications   amLODipine (NORVASC) 10 MG tablet   atorvastatin (LIPITOR) 20 MG tablet   hydrALAZINE (APRESOLINE) 25 MG tablet   labetalol (NORMODYNE) 100 MG tablet   lisinopril (ZESTRIL) 20 MG tablet   Other Relevant Orders   Ambulatory referral to Home Health     Digestive   Dysphagia    Ongoing since last August He is taking longer to chew and swallow food and having trouble swallowing his medications Will order barium swallow and speech therapy home health to evaluate Likely related to dementia Now losing weight, however, which suggests that it may be worsening      Relevant Orders   DG ESOPHAGUS W SINGLE CM (SOL OR THIN BA)     Nervous and Auditory   Dementia without behavioral disturbance (HCC)    Chronic and stable Related to HIV and previous substance abuse Minimally verbal Will resume previous medications, including Zyprexa and Aricept Continue lorazepam as needed for agitation/anxiety      Relevant Medications   donepezil (ARICEPT) 10 MG tablet   LORazepam (ATIVAN) 1 MG tablet   OLANZapine (ZYPREXA) 5 MG tablet   Other Relevant Orders   Ambulatory referral to [place]     Genitourinary   CKD (chronic kidney disease), stage III ([place])    Needs to reestablish with nephrology Resume ACE inhibitor Secondary to HIV and HTN and history of drug abuse Reviewed recent metabolic panel Recheck at next visit in 1 month after resuming medications        Other   Human immunodeficiency virus (HIV) disease ([place]) (Chronic)    Continue to follow with ID Daughter states that although he has been out of all of his other medications, he has continued to take his [place] with good compliance      Decreased activities of daily living (ADL)    Needs assistance with dressing, bathing, preparing food Related to advanced dementia Home health nursing and PT Is living in a SNF      Relevant Orders    Ambulatory referral to [place]   History of substance abuse ([place])    No recent drug use as he does not have access       Other Visit Diagnoses    Nail dystrophy  Relevant Orders   Ambulatory referral to Podiatry   Weight loss       Relevant Orders   DG ESOPHAGUS W SINGLE CM (SOL OR THIN BA)     Rx given to daughter to give to SNF for double portions at meals   Return in about 4 weeks (around 10/05/2020) for chronic disease f/u.      I, Lavon Paganini, MD, have reviewed all documentation for this visit. The documentation on 09/07/20 for the exam, diagnosis, procedures, and orders are all accurate and complete.   Adryen Cookson, Dionne Bucy, MD, MPH [facility]

## 2020-09-07 ENCOUNTER — Other Ambulatory Visit: Payer: Self-pay

## 2020-09-07 ENCOUNTER — Ambulatory Visit (INDEPENDENT_AMBULATORY_CARE_PROVIDER_SITE_OTHER): Payer: Medicare Other | Admitting: Family Medicine

## 2020-09-07 ENCOUNTER — Telehealth: Payer: Self-pay

## 2020-09-07 ENCOUNTER — Encounter: Payer: Self-pay | Admitting: Family Medicine

## 2020-09-07 ENCOUNTER — Other Ambulatory Visit (HOSPITAL_COMMUNITY): Payer: Self-pay | Admitting: Family Medicine

## 2020-09-07 VITALS — BP 200/116 | HR 76 | Temp 98.6°F | Resp 16 | Wt 131.0 lb

## 2020-09-07 DIAGNOSIS — B20 Human immunodeficiency virus [HIV] disease: Secondary | ICD-10-CM

## 2020-09-07 DIAGNOSIS — L603 Nail dystrophy: Secondary | ICD-10-CM

## 2020-09-07 DIAGNOSIS — F1911 Other psychoactive substance abuse, in remission: Secondary | ICD-10-CM | POA: Diagnosis not present

## 2020-09-07 DIAGNOSIS — I10 Essential (primary) hypertension: Secondary | ICD-10-CM | POA: Diagnosis not present

## 2020-09-07 DIAGNOSIS — F0390 Unspecified dementia without behavioral disturbance: Secondary | ICD-10-CM

## 2020-09-07 DIAGNOSIS — Z789 Other specified health status: Secondary | ICD-10-CM

## 2020-09-07 DIAGNOSIS — N1832 Chronic kidney disease, stage 3b: Secondary | ICD-10-CM | POA: Diagnosis not present

## 2020-09-07 DIAGNOSIS — R634 Abnormal weight loss: Secondary | ICD-10-CM | POA: Diagnosis not present

## 2020-09-07 DIAGNOSIS — R1310 Dysphagia, unspecified: Secondary | ICD-10-CM

## 2020-09-07 MED ORDER — HYDRALAZINE HCL 25 MG PO TABS: 25.0000 mg | ORAL_TABLET | Freq: Three times a day (TID) | ORAL | 3 refills | Status: DC

## 2020-09-07 MED ORDER — BIKTARVY 50-200-25 MG PO TABS: 1.0000 | ORAL_TABLET | Freq: Every day | ORAL | 2 refills | Status: DC

## 2020-09-07 MED ORDER — AMLODIPINE BESYLATE 10 MG PO TABS: 10.0000 mg | ORAL_TABLET | Freq: Every day | ORAL | 3 refills | Status: DC

## 2020-09-07 MED ORDER — LISINOPRIL 20 MG PO TABS
20.0000 mg | ORAL_TABLET | Freq: Every day | ORAL | 3 refills | Status: DC
  Filled 2021-01-19: qty 90, 90d supply, fill #0
  Filled 2021-05-04: qty 90, 90d supply, fill #1

## 2020-09-07 MED ORDER — HYDROCORTISONE 1 % EX OINT: 1.0000 "application " | TOPICAL_OINTMENT | Freq: Two times a day (BID) | CUTANEOUS | 5 refills | Status: AC | PRN

## 2020-09-07 MED ORDER — LABETALOL HCL 100 MG PO TABS: 100.0000 mg | ORAL_TABLET | Freq: Two times a day (BID) | ORAL | 1 refills | Status: DC

## 2020-09-07 MED ORDER — SENNOSIDES-DOCUSATE SODIUM 8.6-50 MG PO TABS: 1.0000 | ORAL_TABLET | Freq: Every evening | ORAL | 1 refills | Status: DC | PRN

## 2020-09-07 MED ORDER — ACETAMINOPHEN 500 MG PO TABS: 500.0000 mg | ORAL_TABLET | Freq: Two times a day (BID) | ORAL | 11 refills | Status: DC

## 2020-09-07 MED ORDER — ATORVASTATIN CALCIUM 20 MG PO TABS: 20.0000 mg | ORAL_TABLET | Freq: Every day | ORAL | 1 refills | Status: DC

## 2020-09-07 MED ORDER — OLANZAPINE 5 MG PO TABS: 5.0000 mg | ORAL_TABLET | Freq: Every day | ORAL | 3 refills | Status: DC | PRN

## 2020-09-07 MED ORDER — LORAZEPAM 1 MG PO TABS: 1.0000 mg | ORAL_TABLET | ORAL | 4 refills | Status: DC | PRN

## 2020-09-07 MED ORDER — DONEPEZIL HCL 10 MG PO TABS: 10.0000 mg | ORAL_TABLET | Freq: Every day | ORAL | 1 refills | Status: DC

## 2020-09-07 MED FILL — DIVALPROEX SODIUM 125 MG CA: 125 | 15 days supply | Qty: 60 | Fill #1

## 2020-09-07 MED FILL — LISINOPRIL 20 MG TABS: 20 | 90 days supply | Qty: 90 | Fill #0

## 2020-09-07 MED FILL — AMLODIPINE BESYLATE 10 MG T: 10 | 30 days supply | Qty: 30 | Fill #9

## 2020-09-07 MED FILL — LORAZEPAM 1 MG TABS: 1 | 3 days supply | Qty: 20 | Fill #0

## 2020-09-07 MED FILL — LABETALOL HCL 100MG TABLET: 100 | 90 days supply | Qty: 180 | Fill #0

## 2020-09-07 MED FILL — HYDRALAZINE HCL 25 MG TABS: 25 | 30 days supply | Qty: 90 | Fill #1

## 2020-09-07 MED FILL — BIKTARVY 50-200-25 MG TABS: 50-200-25 | 30 days supply | Qty: 30 | Fill #0

## 2020-09-07 MED FILL — OLANZapine 5 MG TABS: 5 | 30 days supply | Qty: 30 | Fill #1

## 2020-09-07 MED FILL — DONEPEZIL HCL 10 MG TABLET: 10 | 90 days supply | Qty: 90 | Fill #0

## 2020-09-07 MED FILL — ATORVASTATIN CALCIUM 20 MG: 20 | 90 days supply | Qty: 90 | Fill #0

## 2020-09-07 NOTE — Assessment & Plan Note (Signed)
No recent drug use as he does not have access

## 2020-09-07 NOTE — Assessment & Plan Note (Signed)
Needs to reestablish with nephrology Resume ACE inhibitor Secondary to HIV and HTN and history of drug abuse Reviewed recent metabolic panel Recheck at next visit in 1 month after resuming medications

## 2020-09-07 NOTE — Assessment & Plan Note (Signed)
Ongoing since last August He is taking longer to chew and swallow food and having trouble swallowing his medications Will order barium swallow and speech therapy home health to evaluate Likely related to dementia Now losing weight, however, which suggests that it may be worsening

## 2020-09-07 NOTE — Assessment & Plan Note (Signed)
Needs assistance with dressing, bathing, preparing food Related to advanced dementia Home health nursing and PT Is living in a SNF

## 2020-09-07 NOTE — Telephone Encounter (Signed)
Patient's daughter called office to follow up on medication refill for Biktarvy. States patient is still waiting on refills from office. Per chart patient should have refills available at [facility].  Daughter was not informed of this and is requesting prescription be faxed to [facility] assisted living.  Scripted printed and sign by Terri Piedra, FNP. Will update daughter for her to pick up script as requested.

## 2020-09-07 NOTE — Assessment & Plan Note (Signed)
Continue to follow with ID Daughter states that although he has been out of all of his other medications, he has continued to take his Biktarvy with good compliance

## 2020-09-07 NOTE — Assessment & Plan Note (Signed)
Blood pressure very uncontrolled today, but asymptomatic He has been off of all of his medications for 3 weeks, which is likely contributing Resume previous medications Follow-up in 1 month and recheck metabolic panel at that time and consider further dose titration of antihypertensives

## 2020-09-07 NOTE — Assessment & Plan Note (Signed)
Chronic and stable Related to HIV and previous substance abuse Minimally verbal Will resume previous medications, including Zyprexa and Aricept Continue lorazepam as needed for agitation/anxiety

## 2020-09-10 DIAGNOSIS — R32 Unspecified urinary incontinence: Secondary | ICD-10-CM | POA: Diagnosis not present

## 2020-09-17 ENCOUNTER — Other Ambulatory Visit (HOSPITAL_COMMUNITY): Payer: Self-pay

## 2020-09-23 ENCOUNTER — Ambulatory Visit: Payer: Self-pay | Admitting: Podiatry

## 2020-09-24 ENCOUNTER — Inpatient Hospital Stay: Admission: RE | Admit: 2020-09-24 | Payer: Medicare HMO | Source: Ambulatory Visit

## 2020-09-28 ENCOUNTER — Other Ambulatory Visit: Payer: Self-pay

## 2020-09-28 ENCOUNTER — Encounter (HOSPITAL_COMMUNITY): Payer: Self-pay

## 2020-09-28 ENCOUNTER — Emergency Department (HOSPITAL_COMMUNITY)
Admission: EM | Admit: 2020-09-28 | Discharge: 2020-09-29 | Disposition: A | Discharge status: Home / Self Care | Payer: Medicare HMO | Attending: Emergency Medicine | Admitting: Emergency Medicine

## 2020-09-28 ENCOUNTER — Ambulatory Visit: Payer: Self-pay | Admitting: *Deleted

## 2020-09-28 DIAGNOSIS — I10 Essential (primary) hypertension: Secondary | ICD-10-CM | POA: Diagnosis not present

## 2020-09-28 DIAGNOSIS — I129 Hypertensive chronic kidney disease with stage 1 through stage 4 chronic kidney disease, or unspecified chronic kidney disease: Secondary | ICD-10-CM | POA: Diagnosis not present

## 2020-09-28 DIAGNOSIS — F17210 Nicotine dependence, cigarettes, uncomplicated: Secondary | ICD-10-CM | POA: Insufficient documentation

## 2020-09-28 DIAGNOSIS — B20 Human immunodeficiency virus [HIV] disease: Secondary | ICD-10-CM | POA: Diagnosis not present

## 2020-09-28 DIAGNOSIS — N183 Chronic kidney disease, stage 3 unspecified: Secondary | ICD-10-CM | POA: Diagnosis not present

## 2020-09-28 DIAGNOSIS — F0390 Unspecified dementia without behavioral disturbance: Secondary | ICD-10-CM | POA: Insufficient documentation

## 2020-09-28 DIAGNOSIS — Z7982 Long term (current) use of aspirin: Secondary | ICD-10-CM | POA: Insufficient documentation

## 2020-09-28 DIAGNOSIS — Z79899 Other long term (current) drug therapy: Secondary | ICD-10-CM | POA: Diagnosis not present

## 2020-09-28 DIAGNOSIS — R030 Elevated blood-pressure reading, without diagnosis of hypertension: Secondary | ICD-10-CM | POA: Diagnosis present

## 2020-09-28 LAB — BASIC METABOLIC PANEL
Anion gap: 4 — ABNORMAL LOW (ref 5–15)
BUN: 28 mg/dL — ABNORMAL HIGH (ref 8–23)
CO2: 29 mmol/L (ref 22–32)
Calcium: 9 mg/dL (ref 8.9–10.3)
Chloride: 109 mmol/L (ref 98–111)
Creatinine, Ser: 2.3 mg/dL — ABNORMAL HIGH (ref 0.61–1.24)
GFR, Estimated: 31 mL/min — ABNORMAL LOW (ref >60)
Glucose, Bld: 88 mg/dL (ref 70–99)
Potassium: 3.9 mmol/L (ref 3.5–5.1)
Sodium: 142 mmol/L (ref 135–145)

## 2020-09-28 LAB — CBC
HCT: 40.1 % (ref 39.0–52.0)
Hemoglobin: 13 g/dL (ref 13.0–17.0)
MCH: 30 pg (ref 26.0–34.0)
MCHC: 32.4 g/dL (ref 30.0–36.0)
MCV: 92.4 fL (ref 80.0–100.0)
Platelets: 157 10*3/uL (ref 150–400)
RBC: 4.34 MIL/uL (ref 4.22–5.81)
RDW: 12.7 % (ref 11.5–15.5)
WBC: 4.4 10*3/uL (ref 4.0–10.5)
nRBC: 0 % (ref 0.0–0.2)

## 2020-09-28 LAB — TROPONIN I (HIGH SENSITIVITY): Troponin I (High Sensitivity): 8 ng/L (ref <18)

## 2020-09-28 MED ORDER — LABETALOL HCL 200 MG PO TABS
100.0000 mg | ORAL_TABLET | Freq: Once | ORAL | Status: AC
  Administered 2020-09-28: 100 mg via ORAL
  Filled 2020-09-28: qty 1

## 2020-09-28 MED ORDER — HYDRALAZINE HCL 25 MG PO TABS
25.0000 mg | ORAL_TABLET | Freq: Once | ORAL | Status: AC
  Administered 2020-09-28: 25 mg via ORAL
  Filled 2020-09-28: qty 1

## 2020-09-28 MED ORDER — LISINOPRIL 20 MG PO TABS
20.0000 mg | ORAL_TABLET | Freq: Once | ORAL | Status: AC
  Administered 2020-09-28: 20 mg via ORAL
  Filled 2020-09-28: qty 1

## 2020-09-28 MED ORDER — AMLODIPINE BESYLATE 5 MG PO TABS
10.0000 mg | ORAL_TABLET | Freq: Once | ORAL | Status: AC
  Administered 2020-09-28: 10 mg via ORAL
  Filled 2020-09-28: qty 2

## 2020-09-28 NOTE — Discharge Instructions (Addendum)
The MAR does not show that you are taking the Norvasc hydralazine labetalol and lisinopril that were recently prescribed by the primary doctor.

## 2020-09-28 NOTE — Telephone Encounter (Signed)
Please review for Dr. B ° ° °Thanks,  ° °-Peter Cox  °

## 2020-09-28 NOTE — ED Provider Notes (Signed)
[facility] EMERGENCY DEPARTMENT Provider Note   CSN: [id number] Arrival date & time: 09/28/20  1048     History Chief Complaint  Patient presents with  . Hypertension    Peter Cox is a 60 y.o. male.  HPI Level 5 caveat due to dementia.  Patient sent in from [facility].  Reportedly high blood pressure.  Called PCP and was told to come into the ER due to blood pressure greater than 180/100.  Patient is demented but without complaints at this time.  MAR from nursing home shows patient on no blood pressure medicines.    Past Medical History:  Diagnosis Date  . Alcohol abuse    heavy, clean since April  . Alcohol abuse 11/24/2014  . Cocaine abuse ([place])     clean since about 2000  . Drug abuse ([place]) 11/24/2014  . HIV infection ([place])    dx 08/08/2008 (unknown how he was exposed)  Initial VL 43,200 and CD4 410 on 08/28/08  . Hyperlipidemia   . Hypertension   . Marijuana abuse   . Mild cognitive impairment    likely vascular dementia/psa/?HIV componentCT Head 02/12/08 for htn crisis and concern for stroke: impression-1. Old right internal capsule and thalamic lacunar infarcts. 2. Mild to moderate chronic small vessell white matter ischemic changes in both cerebral hemisphere, greater on the right  . Neuromuscular disorder ([place])   . Seizures ([place])    "last year had seizure"  . Stroke ([place])   . Tobacco abuse     Patient Active Problem List   Diagnosis Date Noted  . Dysphagia 02/06/2020  . Physical deconditioning 02/06/2020  . Renal mass 01/01/2020  . Secondary hyperparathyroidism of renal origin ([place]) 01/01/2020  . Benign hypertensive kidney disease with chronic kidney disease 11/19/2019  . Proteinuria 11/19/2019  . History of substance abuse ([place]) 09/17/2018  . Urinary incontinence 09/17/2018  . Bilateral primary osteoarthritis of knee 09/13/2018  . Decreased activities of daily living (ADL) 09/13/2018  . Alcohol abuse 11/24/2014  . Dementia without  behavioral disturbance ([place]) 07/02/2014  . History of CVA (cerebrovascular accident) 07/02/2014  . CKD (chronic kidney disease), stage III ([place]) 11/20/2008  . Human immunodeficiency virus (HIV) disease ([place]) 08/17/2008  . HLD (hyperlipidemia) 06/26/2008  . Dyslipidemia 06/26/2008  . Essential hypertension 06/24/2008    Past Surgical History:  Procedure Laterality Date  . CLOSED REDUCTION PATELLAR     right knee  . HEMORRHOID SURGERY    . SHOULDER ARTHROSCOPY  01/2008   w/extensive debridement (Dr Mardelle Matte)       Family History  Problem Relation Age of Onset  . Diabetes Mother   . Hypertension Mother   . Stroke Mother   . Diabetes Father   . Hypertension Sister   . Hypertension Sister   . Colon cancer Neg Hx     Social History   Tobacco Use  . Smoking status: Current Every Day Smoker    Packs/day: 0.50    Years: 40.00    Pack years: 20.00    Types: Cigarettes  . Smokeless tobacco: Never Used  . Tobacco comment: Quit  x 2 week.  Vaping Use  . Vaping Use: Never used  Substance Use Topics  . Alcohol use: Not Currently    Alcohol/week: 0.0 standard drinks    Comment: drink beer  . Drug use: Not Currently    Frequency: 7.0 times per week    Types: Marijuana    Comment: use Marijuana every day per pt.  Home Medications Prior to Admission medications   Medication Sig Start Date End Date Taking? Authorizing Provider  acetaminophen (TYLENOL) 500 MG tablet Take 1 tablet (500 mg total) by mouth in the morning and at bedtime. 09/07/20   [name], MD  amLODipine (NORVASC) 10 MG tablet Take 1 tablet (10 mg total) by mouth daily. 09/07/20   [name], MD  aspirin 81 MG EC tablet Take 1 tablet by mouth daily. 09/11/16   [provider]  atorvastatin (LIPITOR) 20 MG tablet Take 1 tablet (20 mg total) by mouth daily. 09/07/20   [name], MD  bictegravir-emtricitabine-tenofovir AF (BIKTARVY) 50-200-25 MG TABS tablet Take 1 tablet by  mouth daily. Patient not taking: Reported on 09/07/2020 09/07/20   Golden Circle, FNP  cyanocobalamin 100 MCG tablet Take 100 mcg by mouth daily. Patient not taking: Reported on 09/07/2020    [provider]  donepezil (ARICEPT) 10 MG tablet Take 1 tablet (10 mg total) by mouth at bedtime. Take 1/2 tablet daily for 2 weeks, then increase to 1 tablet daily 09/07/20   [name], MD  hydrALAZINE (APRESOLINE) 25 MG tablet Take 1 tablet (25 mg total) by mouth 3 (three) times daily. 09/07/20   [name], MD  hydrocortisone 1 % ointment Apply 1 application topically 2 (two) times daily as needed for itching (hemorrhoids). 09/07/20   [name], MD  labetalol (NORMODYNE) 100 MG tablet Take 1 tablet (100 mg total) by mouth 2 (two) times daily. 09/07/20   [name], MD  lisinopril (ZESTRIL) 20 MG tablet Take 1 tablet (20 mg total) by mouth daily. 09/07/20   Bacigalupo, Dionne Bucy, MD  LORazepam (ATIVAN) 1 MG tablet Take 1 tablet (1 mg total) by mouth every 4 (four) hours as needed for anxiety. 09/07/20   Bacigalupo, Dionne Bucy, MD  OLANZapine (ZYPREXA) 5 MG tablet Take 1 tablet (5 mg total) by mouth daily as needed (agitation). 09/07/20   [name], MD  senna-docusate (SENOKOT-S) 8.6-50 MG tablet Take 1 tablet by mouth at bedtime as needed for moderate constipation. 09/07/20   [name], MD    Allergies    Patient has no known allergies.  Review of Systems   Review of Systems  Unable to perform ROS: Dementia    Physical Exam Updated Vital Signs BP (!) 193/98   Pulse (!) 58   Temp 98.2 F (36.8 C) (Oral)   Resp 13   Ht 5\' 8"  (1.727 m)   Wt 59.3 kg   SpO2 100%   BMI 19.88 kg/m   Physical Exam Vitals and nursing note reviewed.  Constitutional:      Appearance: Normal appearance.  HENT:     Head: Normocephalic.  Eyes:     Pupils: Pupils are equal, round, and reactive to light.  Cardiovascular:     Rate and Rhythm: Normal  rate and regular rhythm.  Pulmonary:     Breath sounds: No wheezing or rhonchi.  Abdominal:     Tenderness: There is no abdominal tenderness.  Musculoskeletal:        General: No tenderness.  Skin:    Capillary Refill: Capillary refill takes less than 2 seconds.  Neurological:     Mental Status: He is alert. Mental status is at baseline.     ED Results / Procedures / Treatments   Labs (all labs ordered are listed, but only abnormal results are displayed) Labs Reviewed  BASIC METABOLIC PANEL - Abnormal;  Notable for the following components:      Result Value   BUN 28 (*)    Creatinine, Ser 2.30 (*)    GFR, Estimated 31 (*)    Anion gap 4 (*)    All other components within normal limits  CBC  TROPONIN I (HIGH SENSITIVITY)  TROPONIN I (HIGH SENSITIVITY)    EKG EKG Interpretation  Date/Time:  Monday September 28 2020 10:53:20 EDT Ventricular Rate:  66 PR Interval:  163 QRS Duration: 85 QT Interval:  485 QTC Calculation: 509 R Axis:   77 Text Interpretation: Sinus rhythm Left ventricular hypertrophy Probable anterior infarct, old Prolonged QT interval Confirmed by Davonna Belling [phone]) on 09/28/2020 11:03:11 AM   Radiology No results found.  Procedures Procedures   Medications Ordered in ED Medications  amLODipine (NORVASC) tablet 10 mg (has no administration in time range)  labetalol (NORMODYNE) tablet 100 mg (has no administration in time range)  hydrALAZINE (APRESOLINE) tablet 25 mg (has no administration in time range)  lisinopril (ZESTRIL) tablet 20 mg (has no administration in time range)    ED Course  I have reviewed the triage vital signs and the nursing notes.  Pertinent labs & imaging results that were available during my care of the patient were reviewed by me and considered in my medical decision making (see chart for details).    MDM Rules/Calculators/A&P                          Patient sent in from nursing home for high blood pressure.   Nursing home MAR does not show that he is on any blood pressure medications.  However reviewing recent office visit from the end of March patient is supposed be on 4 different medications.  Had been restarted but I do not see that he is actually been given these.  Will give medications here.  Lab work reassuring.  EKG reassuring.  Doubt acute endorgan damage.  Do not think he needs further work-up at this time just restarting his home medicines.  Discharge back to nursing home. Final Clinical Impression(s) / ED Diagnoses Final diagnoses:  Hypertension, unspecified type    Rx / DC Orders ED Discharge Orders    None       Davonna Belling, MD 09/28/20 1344

## 2020-09-28 NOTE — ED Triage Notes (Addendum)
Pt from [facility] for c/o HTN; hx of same. Pt has had several high BP readings at facility. Pt hasn't been taking HTN meds. Pt denies being prescribed anything for HTN. c/o of HA PTA; denies now.BP 202/102 with EMS.

## 2020-09-28 NOTE — ED Notes (Signed)
Called [facility] at 16:16; operator stated it would be at least 2 hours for ETA.

## 2020-09-28 NOTE — Telephone Encounter (Signed)
Spoke with laura who stated that nurse Philis Nettle was on lunch, she was advised as below and informed me that they sent patient to ED this morning. KW

## 2020-09-28 NOTE — ED Notes (Addendum)
Manual b/p of 204/110, Dr Alvino Chapel notified

## 2020-09-28 NOTE — Telephone Encounter (Signed)
198/112, 192/117, now 198/117 cma did not have HR to report-was not in the patient's room at the time of call.  Several B/P medications discontinued while patient was under the care of hospice-he is no longer being care for by hospice.  Instructed nurse/nurse tech to do assess for stroke symptoms now and recheck B/P and HR. If >180/100 or with any stroke-like symptom call 911 immediately.  Routing to provider for medications as warranted.

## 2020-09-28 NOTE — Telephone Encounter (Signed)
Patient will need evaluation and go to the emergency room if still elevated now.

## 2020-09-29 DIAGNOSIS — R079 Chest pain, unspecified: Secondary | ICD-10-CM | POA: Diagnosis not present

## 2020-09-29 DIAGNOSIS — R0789 Other chest pain: Secondary | ICD-10-CM | POA: Diagnosis not present

## 2020-09-29 DIAGNOSIS — I10 Essential (primary) hypertension: Secondary | ICD-10-CM | POA: Diagnosis not present

## 2020-09-29 DIAGNOSIS — Z743 Need for continuous supervision: Secondary | ICD-10-CM | POA: Diagnosis not present

## 2020-09-29 DIAGNOSIS — R279 Unspecified lack of coordination: Secondary | ICD-10-CM | POA: Diagnosis not present

## 2020-09-29 NOTE — ED Notes (Signed)
Patient verbalizes understanding of discharge instructions. Opportunity for questioning and answers were provided. Armband removed by staff, pt discharged from ED via [place].

## 2020-09-30 ENCOUNTER — Telehealth: Payer: Self-pay

## 2020-09-30 NOTE — Telephone Encounter (Signed)
Copied from [place] [id number]. Topic: General - Other >> Sep 30, 2020  2:11 PM Leward Quan A wrote: Reason for CRM: Patients care giver [name] called in to inquire of Dr B since the patient was taken off all meds while on Hospice would Dr B like for him to be taking the bictegravir-emtricitabine-tenofovir AF (BIKTARVY) 50-200-25 MG TABS tablet and his other medication. Also need it sent to [place], [place] - [address]  Phone:  [phone] Fax:  [phone]    [name] Ph# [phone]

## 2020-10-01 ENCOUNTER — Other Ambulatory Visit (HOSPITAL_COMMUNITY): Payer: Self-pay

## 2020-10-01 ENCOUNTER — Other Ambulatory Visit: Payer: Self-pay

## 2020-10-01 DIAGNOSIS — B20 Human immunodeficiency virus [HIV] disease: Secondary | ICD-10-CM

## 2020-10-01 MED ORDER — BIKTARVY 50-200-25 MG PO TABS: 1.0000 | ORAL_TABLET | Freq: Every day | ORAL | 1 refills | Status: DC

## 2020-10-01 MED FILL — Lorazepam Tab 1 MG: ORAL | 6 days supply | Qty: 20 | Fill #0 | Status: AC

## 2020-10-01 MED FILL — Bictegravir-Emtricitabine-Tenofovir AF Tab 50-200-25 MG: ORAL | 30 days supply | Qty: 30 | Fill #0 | Status: AC

## 2020-10-01 MED FILL — Hydralazine HCl Tab 25 MG: ORAL | 30 days supply | Qty: 90 | Fill #0 | Status: AC

## 2020-10-01 MED FILL — Divalproex Sodium Cap Delayed Release Sprinkle 125 MG: ORAL | 30 days supply | Qty: 60 | Fill #0 | Status: AC

## 2020-10-01 MED FILL — Olanzapine Tab 5 MG: ORAL | 30 days supply | Qty: 30 | Fill #0 | Status: AC

## 2020-10-01 MED FILL — Amlodipine Besylate Tab 10 MG (Base Equivalent): ORAL | 90 days supply | Qty: 90 | Fill #0 | Status: AC

## 2020-10-01 NOTE — Telephone Encounter (Signed)
Tried calling Peter Cox to get clarification on message below and no answer. Tried calling daughter, and phone was disconnected. Will save message to chart as we dont know what they are requesting.

## 2020-10-06 ENCOUNTER — Inpatient Hospital Stay: Admitting: Family Medicine

## 2020-10-06 ENCOUNTER — Other Ambulatory Visit (HOSPITAL_COMMUNITY): Payer: Self-pay

## 2020-10-07 ENCOUNTER — Other Ambulatory Visit (HOSPITAL_COMMUNITY): Payer: Self-pay

## 2020-10-08 ENCOUNTER — Ambulatory Visit: Payer: Self-pay | Admitting: Family Medicine

## 2020-10-08 DIAGNOSIS — R32 Unspecified urinary incontinence: Secondary | ICD-10-CM | POA: Diagnosis not present

## 2020-10-12 ENCOUNTER — Other Ambulatory Visit (HOSPITAL_COMMUNITY): Payer: Self-pay

## 2020-10-20 ENCOUNTER — Telehealth: Payer: Self-pay

## 2020-10-20 NOTE — Telephone Encounter (Signed)
Copied from [place] [id number]. Topic: General - Other >> Oct 20, 2020  9:40 AM Leward Quan A wrote: Reason for CRM: Santiago Glad with [facility] assisted living called in asking Dr B to please write an order to reinstate all patients medication. Patient have been taking all his medication since coming off Hospice care but need order for their record  Please fax to att.Santiago Glad fax# [phone] Ph# [phone]

## 2020-10-20 NOTE — Telephone Encounter (Signed)
Letter completed. Please fax. Thanks!

## 2020-10-20 NOTE — Addendum Note (Signed)
Addended by: [name] on: 10/20/2020 01:01 PM   Modules accepted: Orders

## 2020-10-20 NOTE — Telephone Encounter (Signed)
Letter will be faxed. KW

## 2020-10-22 ENCOUNTER — Ambulatory Visit: Payer: Medicaid Other | Admitting: Podiatry

## 2020-10-26 ENCOUNTER — Ambulatory Visit (INDEPENDENT_AMBULATORY_CARE_PROVIDER_SITE_OTHER): Payer: Medicare HMO | Admitting: Podiatry

## 2020-10-26 ENCOUNTER — Other Ambulatory Visit: Payer: Self-pay

## 2020-10-26 ENCOUNTER — Encounter: Payer: Self-pay | Admitting: Podiatry

## 2020-10-26 DIAGNOSIS — M79674 Pain in right toe(s): Secondary | ICD-10-CM | POA: Diagnosis not present

## 2020-10-26 DIAGNOSIS — M79675 Pain in left toe(s): Secondary | ICD-10-CM | POA: Diagnosis not present

## 2020-10-26 DIAGNOSIS — B351 Tinea unguium: Secondary | ICD-10-CM | POA: Diagnosis not present

## 2020-10-27 ENCOUNTER — Encounter: Payer: Self-pay | Admitting: Podiatry

## 2020-10-27 NOTE — Progress Notes (Signed)
  Subjective:  Patient ID: Peter Cox, male    DOB: 09/23/1960,  MRN: [id number]  Chief Complaint  Patient presents with  . Nail Problem    Thick/painful toenails need trimming    60 y.o. male presents with the above complaint. History confirmed with patient.  Here with an assistant caregiver  Objective:  Physical Exam: warm, good capillary refill, no trophic changes or ulcerative lesions, normal DP and PT pulses and normal sensory exam.  He has thickened elongated nail plates with subungual debris and yellow discoloration   Assessment:   1. Pain due to onychomycosis of toenails of both feet      Plan:  Patient was evaluated and treated and all questions answered.  Discussed the etiology and treatment options for the condition in detail with the patient. Educated patient on the topical and oral treatment options for mycotic nails. Recommended debridement of the nails today. Sharp and mechanical debridement performed of all painful and mycotic nails today. Nails debrided in length and thickness using a nail nipper to level of comfort. Discussed treatment options including appropriate shoe gear. Follow up as needed for painful nails.   Return in about 3 months (around 01/26/2021) for routine nail care.

## 2020-11-02 ENCOUNTER — Ambulatory Visit: Payer: Medicare HMO

## 2020-11-02 ENCOUNTER — Other Ambulatory Visit: Payer: Self-pay

## 2020-11-02 ENCOUNTER — Telehealth: Payer: Medicare HMO | Admitting: Family

## 2020-11-02 ENCOUNTER — Ambulatory Visit
Admission: RE | Admit: 2020-11-02 | Discharge: 2020-11-02 | Disposition: A | Discharge status: Home / Self Care | Payer: Medicare HMO | Source: Ambulatory Visit | Attending: Urology | Admitting: Urology

## 2020-11-02 ENCOUNTER — Ambulatory Visit: Payer: Self-pay | Admitting: Urology

## 2020-11-02 DIAGNOSIS — N2889 Other specified disorders of kidney and ureter: Secondary | ICD-10-CM | POA: Insufficient documentation

## 2020-11-02 DIAGNOSIS — N281 Cyst of kidney, acquired: Secondary | ICD-10-CM | POA: Diagnosis not present

## 2020-11-04 ENCOUNTER — Other Ambulatory Visit (HOSPITAL_COMMUNITY): Payer: Self-pay

## 2020-11-04 ENCOUNTER — Encounter: Payer: Self-pay | Admitting: Urology

## 2020-11-04 ENCOUNTER — Other Ambulatory Visit: Payer: Self-pay

## 2020-11-04 ENCOUNTER — Other Ambulatory Visit: Payer: Self-pay | Admitting: Family

## 2020-11-04 ENCOUNTER — Ambulatory Visit (INDEPENDENT_AMBULATORY_CARE_PROVIDER_SITE_OTHER): Payer: Medicare HMO | Admitting: Urology

## 2020-11-04 VITALS — BP 138/80 | HR 76 | Ht 68.0 in | Wt 130.0 lb

## 2020-11-04 DIAGNOSIS — N2889 Other specified disorders of kidney and ureter: Secondary | ICD-10-CM | POA: Diagnosis not present

## 2020-11-04 NOTE — Progress Notes (Signed)
11/04/2020 1:42 PM   Peter Cox 09-Jul-1960 [id number]  Referring provider: [name], [address] [place],  [place] [address]  Chief Complaint  Patient presents with  . renal mass    Urologic history: 1.  Indeterminate right renal mass  CT without contrast 12 mm right upper pole indeterminate lesion  Severe CKD  Elected surveillance  HPI: 60 y.o. male presents for renal mass follow-up.   No complaints today; no family present  Denies dysuria, gross hematuria  Denies flank, abdominal or pelvic pain  Renal ultrasound performed 11/02/2020 with a 1.2 x 1.0 x 1.0 hyperechoic right renal mass (previous 1.3 x 1.0 x 1.2)   PMH: Past Medical History:  Diagnosis Date  . Alcohol abuse    heavy, clean since April  . Alcohol abuse 11/24/2014  . Cocaine abuse ([place])     clean since about 2000  . Drug abuse ([place]) 11/24/2014  . HIV infection ([place])    dx 08/08/2008 (unknown how he was exposed)  Initial VL 43,200 and CD4 410 on 08/28/08  . Hyperlipidemia   . Hypertension   . Marijuana abuse   . Mild cognitive impairment    likely vascular dementia/psa/?HIV componentCT Head 02/12/08 for htn crisis and concern for stroke: impression-1. Old right internal capsule and thalamic lacunar infarcts. 2. Mild to moderate chronic small vessell white matter ischemic changes in both cerebral hemisphere, greater on the right  . Neuromuscular disorder ([place])   . Seizures ([place])    "last year had seizure"  . Stroke ([place])   . Tobacco abuse     Surgical History: Past Surgical History:  Procedure Laterality Date  . CLOSED REDUCTION PATELLAR     right knee  . HEMORRHOID SURGERY    . SHOULDER ARTHROSCOPY  01/2008   w/extensive debridement (Dr Mardelle Matte)    Home Medications:  Allergies as of 11/04/2020   No Known Allergies     Medication List       Accurate as of Nov 04, 2020  1:42 PM. If you have any questions, ask your nurse or doctor.         acetaminophen 500 MG tablet Commonly known as: TYLENOL Take 1 tablet (500 mg total) by mouth in the morning and at bedtime.   amLODipine 10 MG tablet Commonly known as: NORVASC Take 1 tablet (10 mg total) by mouth daily.   aspirin 81 MG EC tablet Take 1 tablet by mouth daily.   atorvastatin 20 MG tablet Commonly known as: LIPITOR Take 1 tablet (20 mg total) by mouth daily.   Biktarvy 50-200-25 MG Tabs tablet Generic drug: bictegravir-emtricitabine-tenofovir AF Take 1 tablet by mouth daily.   divalproex 125 MG capsule Commonly known as: DEPAKOTE SPRINKLE TAKE 2 TABLETS BY MOUTH EVERY 12 HOURS   donepezil 10 MG tablet Commonly known as: ARICEPT Take 1 tablet (10 mg total) by mouth at bedtime. Take 1/2 tablet daily for 2 weeks, then increase to 1 tablet daily   hydrALAZINE 25 MG tablet Commonly known as: APRESOLINE Take 1 tablet (25 mg total) by mouth 3 (three) times daily.   hydrocortisone 1 % ointment Apply 1 application topically 2 (two) times daily as needed for itching (hemorrhoids).   labetalol 100 MG tablet Commonly known as: NORMODYNE Take 1 tablet (100 mg total) by mouth 2 (two) times daily.   lisinopril 20 MG tablet Commonly known as: ZESTRIL Take 1 tablet (20 mg total) by mouth daily.   LORazepam 1 MG tablet  Commonly known as: ATIVAN Take 1 tablet (1 mg total) by mouth every 4 (four) hours as needed for anxiety.   OLANZapine 5 MG tablet Commonly known as: ZYPREXA Take 1 tablet (5 mg total) by mouth daily as needed (agitation).   senna-docusate 8.6-50 MG tablet Commonly known as: Senokot-S Take 1 tablet by mouth at bedtime as needed for moderate constipation.       Allergies: No Known Allergies  Family History: Family History  Problem Relation Age of Onset  . Diabetes Mother   . Hypertension Mother   . Stroke Mother   . Diabetes Father   . Hypertension Sister   . Hypertension Sister   . Colon cancer Neg Hx     Social History:  reports  that he has been smoking cigarettes. He has a 20.00 pack-year smoking history. He has never used smokeless tobacco. He reports previous alcohol use. He reports previous drug use. Frequency: 7.00 times per week. Drug: Marijuana.   Physical Exam: BP 138/80   Pulse 76   Ht 5\' 8"  (1.727 m)   Wt 130 lb (59 kg)   BMI 19.77 kg/m   Constitutional:  Alert, No acute distress. HEENT: [place] AT, moist mucus membranes.  Trachea midline, no masses. Cardiovascular: No clubbing, cyanosis, or edema. Respiratory: Normal respiratory effort, no increased work of breathing. GI: Abdomen is soft, nontender, nondistended, no abdominal masses GU: No CVA tenderness   Pertinent Imaging: Images personally reviewed and interpreted  Ultrasound renal complete  Narrative CLINICAL DATA:  Right renal mass  EXAM: RENAL / URINARY TRACT ULTRASOUND COMPLETE  COMPARISON:  11/28/2019, 05/04/2020  FINDINGS: Right Kidney:  Renal measurements: 8.3 x 4.3 x 4.2 cm = volume: 78.6 mL. There is a 1.2 x 1.0 x 1.0 cm hyperechoic mass within the ventral interpolar region of the right kidney, previously having measured 1.3 x 1.0 by 1.2 cm. This remains equivocal, and follow-up MRI is recommended if not performed in the interim. No hydronephrosis or nephrolithiasis.  Left Kidney:  Renal measurements: 8.5 x 5.1 x 4.3 cm = volume: 96.8 mL. Echogenicity within normal limits. There is a simple 1.0 x 1.0 x 0.9 cm cyst unchanged. No hydronephrosis or nephrolithiasis.  Bladder:  Appears normal for degree of bladder distention.  Other:  None.  IMPRESSION: 1. Stable equivocal 1.2 cm hypoechoic right renal mass. MRI is again recommended if not performed in the interim since recent CT 05/04/2020. 2. Stable benign left renal cyst.   Electronically Signed By: Randa Ngo M.D. On: 11/04/2020 09:26   Assessment & Plan:    1.  Right renal mass  Indeterminate right renal mass; not candidate for contrast secondary to  severe CKD  Family desires surveillance and no significant change on renal ultrasound  Schedule follow-up with renal ultrasound prior 6 months  All questions were answered   Abbie Sons, [facility] [address], [place], [place] [address] [phone]

## 2020-11-05 ENCOUNTER — Encounter: Payer: Self-pay | Admitting: Family

## 2020-11-05 ENCOUNTER — Ambulatory Visit (INDEPENDENT_AMBULATORY_CARE_PROVIDER_SITE_OTHER): Payer: Medicare HMO | Admitting: Family

## 2020-11-05 VITALS — BP 136/82 | HR 95 | Temp 97.9°F | Wt 134.0 lb

## 2020-11-05 DIAGNOSIS — Z79899 Other long term (current) drug therapy: Secondary | ICD-10-CM | POA: Diagnosis not present

## 2020-11-05 DIAGNOSIS — B20 Human immunodeficiency virus [HIV] disease: Secondary | ICD-10-CM | POA: Diagnosis not present

## 2020-11-05 DIAGNOSIS — F0390 Unspecified dementia without behavioral disturbance: Secondary | ICD-10-CM | POA: Diagnosis not present

## 2020-11-05 MED ORDER — BIKTARVY 50-200-25 MG PO TABS: 1.0000 | ORAL_TABLET | Freq: Every day | ORAL | 5 refills | Status: DC

## 2020-11-05 NOTE — Telephone Encounter (Signed)
Appt 5/19

## 2020-11-05 NOTE — Assessment & Plan Note (Signed)
Peter Cox appears to be doing well and likely continues to have well controlled HIV disease with good adherence and tolerance to his ART regimen of Biktarvy. No signs of opportunistic infection. Check lab work today. Continue current dose of Biktarvy. Plan for follow up in 6 months or sooner if needed.

## 2020-11-05 NOTE — Assessment & Plan Note (Signed)
Dementia appears stable. Answers some questions with yes/no answers and laughs at other times. Currently well cared for in a skilled facility.

## 2020-11-05 NOTE — Progress Notes (Signed)
Brief Narrative   Patient ID: Peter Cox, male    DOB: 01/09/1961, 60 y.o.   MRN: [id number]    Subjective:    Chief Complaint  Patient presents with  . Follow-up    6 month follow up       HPI:  Peter Cox is a 60 y.o. male with HIV disease last seen on 04/23/20 with worsening dementia and was continued on his Biktarvy. Subsequently he entered [facility] and was discharged from [facility] on 3/4. Last lab work completed on 03/12/20 with viral load that was undetectable and CD4 count of 338. Here today for routine follow up.    Peter Cox is here with a member of his facility. History is primarily provided by facility assistant given his dementia. Peter Cox has been taking his medication daily as prescribed with no adverse side effects. Feeling well today with no new concerns/complaints. Wanting to have some chocolate.  Peter Cox has no problems obtaining medication from the pharmacy. Denies feelings of being down, depressed or hopeless. No recreational or illicit drug use, tobacco use, or alcohol consumption. Condoms offered. Facility is working on Occupational hygienist appointments.   No Known Allergies    Outpatient Medications Prior to Visit  Medication Sig Dispense Refill  . acetaminophen (TYLENOL) 500 MG tablet Take 1 tablet (500 mg total) by mouth in the morning and at bedtime. 60 tablet 11  . amLODipine (NORVASC) 10 MG tablet Take 1 tablet (10 mg total) by mouth daily. 90 tablet 3  . aspirin 81 MG EC tablet Take 1 tablet by mouth daily.    Marland Kitchen atorvastatin (LIPITOR) 20 MG tablet Take 1 tablet (20 mg total) by mouth daily. 90 tablet 1  . divalproex (DEPAKOTE SPRINKLE) 125 MG capsule TAKE 2 TABLETS BY MOUTH EVERY 12 HOURS 60 capsule 2  . donepezil (ARICEPT) 10 MG tablet Take 1 tablet (10 mg total) by mouth at bedtime. Take 1/2 tablet daily for 2 weeks, then increase to 1 tablet daily 90 tablet 1  . hydrALAZINE (APRESOLINE) 25 MG tablet Take 1 tablet (25 mg total) by mouth 3  (three) times daily. 270 tablet 3  . hydrocortisone 1 % ointment Apply 1 application topically 2 (two) times daily as needed for itching (hemorrhoids). 30 g 5  . labetalol (NORMODYNE) 100 MG tablet Take 1 tablet (100 mg total) by mouth 2 (two) times daily. 180 tablet 1  . lisinopril (ZESTRIL) 20 MG tablet Take 1 tablet (20 mg total) by mouth daily. 90 tablet 3  . LORazepam (ATIVAN) 1 MG tablet Take 1 tablet (1 mg total) by mouth every 4 (four) hours as needed for anxiety. 20 tablet 4  . OLANZapine (ZYPREXA) 5 MG tablet Take 1 tablet (5 mg total) by mouth daily as needed (agitation). 90 tablet 3  . senna-docusate (SENOKOT-S) 8.6-50 MG tablet Take 1 tablet by mouth at bedtime as needed for moderate constipation. 30 tablet 1  . bictegravir-emtricitabine-tenofovir AF (BIKTARVY) 50-200-25 MG TABS tablet Take 1 tablet by mouth daily. 30 tablet 1   No facility-administered medications prior to visit.     Past Medical History:  Diagnosis Date  . Alcohol abuse    heavy, clean since April  . Alcohol abuse 11/24/2014  . Cocaine abuse ([place])     clean since about 2000  . Drug abuse ([place]) 11/24/2014  . HIV infection ([place])    dx 08/08/2008 (unknown how he was exposed)  Initial VL 43,200 and CD4 410 on 08/28/08  .  Hyperlipidemia   . Hypertension   . Marijuana abuse   . Mild cognitive impairment    likely vascular dementia/psa/?HIV componentCT Head 02/12/08 for htn crisis and concern for stroke: impression-1. Old right internal capsule and thalamic lacunar infarcts. 2. Mild to moderate chronic small vessell white matter ischemic changes in both cerebral hemisphere, greater on the right  . Neuromuscular disorder ([place])   . Seizures ([place])    "last year had seizure"  . Stroke ([place])   . Tobacco abuse      Past Surgical History:  Procedure Laterality Date  . CLOSED REDUCTION PATELLAR     right knee  . HEMORRHOID SURGERY    . SHOULDER ARTHROSCOPY  01/2008   w/extensive debridement (Dr Mardelle Matte)        Review of Systems  Unable to perform ROS: Dementia      Objective:    BP 136/82   Pulse 95   Temp 97.9 F (36.6 C) (Oral)   Wt 134 lb (60.8 kg)   SpO2 100%   BMI 20.37 kg/m  Nursing note and vital signs reviewed.  Physical Exam Constitutional:      General: He is not in acute distress.    Appearance: He is well-developed.  Eyes:     Conjunctiva/sclera: Conjunctivae normal.  Cardiovascular:     Rate and Rhythm: Normal rate and regular rhythm.     Heart sounds: Normal heart sounds. No murmur heard. No friction rub. No gallop.   Pulmonary:     Effort: Pulmonary effort is normal. No respiratory distress.     Breath sounds: Normal breath sounds. No wheezing or rales.  Chest:     Chest wall: No tenderness.  Abdominal:     General: Bowel sounds are normal.     Palpations: Abdomen is soft.     Tenderness: There is no abdominal tenderness.  Musculoskeletal:     Cervical back: Neck supple.  Lymphadenopathy:     Cervical: No cervical adenopathy.  Skin:    General: Skin is warm and dry.     Findings: No rash.  Neurological:     Mental Status: He is alert. He is disoriented.      Depression screen [facility] 2/9 11/05/2020 09/07/2020 04/23/2020 09/17/2018 04/17/2018  Decreased Interest 0 0 0 0 0  Down, Depressed, Hopeless 0 0 0 2 -  PHQ - 2 Score 0 0 0 2 0  Altered sleeping - 0 - 0 -  Tired, decreased energy - 0 - 0 -  Change in appetite - 0 - 3 -  Feeling bad or failure about yourself  - 0 - 0 -  Trouble concentrating - 0 - 1 -  Moving slowly or fidgety/restless - 0 - 3 -  Suicidal thoughts - 0 - 0 -  PHQ-9 Score - 0 - 9 -  Difficult doing work/chores - Not difficult at all - Not difficult at all -  Some recent data might be hidden       Assessment & Plan:    Patient Active Problem List   Diagnosis Date Noted  . Dysphagia 02/06/2020  . Physical deconditioning 02/06/2020  . Renal mass 01/01/2020  . Secondary hyperparathyroidism of renal origin ([place])  01/01/2020  . Benign hypertensive kidney disease with chronic kidney disease 11/19/2019  . Proteinuria 11/19/2019  . History of substance abuse ([place]) 09/17/2018  . Urinary incontinence 09/17/2018  . Bilateral primary osteoarthritis of knee 09/13/2018  . Decreased activities of daily living (ADL) 09/13/2018  . Alcohol  abuse 11/24/2014  . Dementia without behavioral disturbance ([place]) 07/02/2014  . History of CVA (cerebrovascular accident) 07/02/2014  . CKD (chronic kidney disease), stage III ([place]) 11/20/2008  . Human immunodeficiency virus (HIV) disease ([place]) 08/17/2008  . HLD (hyperlipidemia) 06/26/2008  . Dyslipidemia 06/26/2008  . Essential hypertension 06/24/2008     Problem List Items Addressed This Visit      Nervous and Auditory   Dementia without behavioral disturbance ([place])    Dementia appears stable. Answers some questions with yes/no answers and laughs at other times. Currently well cared for in a skilled facility.         Other   Human immunodeficiency virus (HIV) disease ([place]) (Chronic)    Peter Cox appears to be doing well and likely continues to have well controlled HIV disease with good adherence and tolerance to his ART regimen of Biktarvy. No signs of opportunistic infection. Check lab work today. Continue current dose of Biktarvy. Plan for follow up in 6 months or sooner if needed.       Relevant Medications   bictegravir-emtricitabine-tenofovir AF (BIKTARVY) 50-200-25 MG TABS tablet   Other Relevant Orders   COMPLETE METABOLIC PANEL WITH GFR   HIV-1 RNA quant-no reflex-bld   T-helper cell (CD4)- ([facility] clinic only)    Other Visit Diagnoses    Pharmacologic therapy    -  Primary   Relevant Orders   Lipid panel   Screening for STDs (sexually transmitted diseases)           I am having Peter Cox. Peter Cox maintain his amLODipine, acetaminophen, atorvastatin, donepezil, hydrALAZINE, labetalol, lisinopril, LORazepam, OLANZapine, senna-docusate, hydrocortisone,  aspirin, divalproex, and Biktarvy.   Meds ordered this encounter  Medications  . bictegravir-emtricitabine-tenofovir AF (BIKTARVY) 50-200-25 MG TABS tablet    Sig: Take 1 tablet by mouth daily.    Dispense:  30 tablet    Refill:  5    Order Specific Question:   Supervising Provider    Answer:   Carlyle Basques [[id number]]     Follow-up: Return in about 6 months (around 05/08/2021), or if symptoms worsen or fail to improve.   Terri Piedra, MSN, FNP-C Nurse Practitioner [facility] for Infectious Disease [place] number: [phone]

## 2020-11-05 NOTE — Patient Instructions (Addendum)
Nice to see you.  We will check your lab work today.  Continue to take your [place] daily as prescribed.   Refills have been sent to the pharmacy.   Plan for follow up in 6 months or sooner if needed with lab work on the same day.

## 2020-11-06 ENCOUNTER — Other Ambulatory Visit: Payer: Self-pay | Admitting: Family

## 2020-11-06 ENCOUNTER — Other Ambulatory Visit (HOSPITAL_COMMUNITY): Payer: Self-pay

## 2020-11-06 DIAGNOSIS — R32 Unspecified urinary incontinence: Secondary | ICD-10-CM | POA: Diagnosis not present

## 2020-11-06 LAB — T-HELPER CELL (CD4) - (RCID CLINIC ONLY)
CD4 % Helper T Cell: 25 % — ABNORMAL LOW (ref 33–65)
CD4 T Cell Abs: 389 /uL — ABNORMAL LOW (ref 400–1790)

## 2020-11-08 LAB — COMPLETE METABOLIC PANEL WITH GFR
AG Ratio: 1.1 (calc) (ref 1.0–2.5)
ALT: 9 U/L (ref 9–46)
AST: 10 U/L (ref 10–35)
Albumin: 3.8 g/dL (ref 3.6–5.1)
Alkaline phosphatase (APISO): 76 U/L (ref 35–144)
BUN/Creatinine Ratio: 11 (calc) (ref 6–22)
BUN: 26 mg/dL — ABNORMAL HIGH (ref 7–25)
CO2: 26 mmol/L (ref 20–32)
Calcium: 9.5 mg/dL (ref 8.6–10.3)
Chloride: 107 mmol/L (ref 98–110)
Creat: 2.37 mg/dL — ABNORMAL HIGH (ref 0.70–1.25)
GFR, Est African American: 33 mL/min/{1.73_m2} — ABNORMAL LOW (ref >=60)
GFR, Est Non African American: 28 mL/min/{1.73_m2} — ABNORMAL LOW (ref >=60)
Globulin: 3.5 g/dL (calc) (ref 1.9–3.7)
Glucose, Bld: 112 mg/dL — ABNORMAL HIGH (ref 65–99)
Potassium: 4.2 mmol/L (ref 3.5–5.3)
Sodium: 142 mmol/L (ref 135–146)
Total Bilirubin: 0.3 mg/dL (ref 0.2–1.2)
Total Protein: 7.3 g/dL (ref 6.1–8.1)

## 2020-11-08 LAB — LIPID PANEL
Cholesterol: 145 mg/dL (ref <200)
HDL: 42 mg/dL (ref >=40)
LDL Cholesterol (Calc): 79 mg/dL (calc)
Non-HDL Cholesterol (Calc): 103 mg/dL (calc) (ref <130)
Total CHOL/HDL Ratio: 3.5 (calc) (ref <5.0)
Triglycerides: 143 mg/dL (ref <150)

## 2020-11-08 LAB — HIV-1 RNA QUANT-NO REFLEX-BLD
HIV 1 RNA Quant: 64 Copies/mL — ABNORMAL HIGH
HIV-1 RNA Quant, Log: 1.81 Log cps/mL — ABNORMAL HIGH

## 2020-11-09 ENCOUNTER — Other Ambulatory Visit: Payer: Self-pay | Admitting: Family Medicine

## 2020-11-09 NOTE — Telephone Encounter (Signed)
Change in pharmacy- remainder of RF forwarded to new pharmacy.

## 2020-11-09 NOTE — Telephone Encounter (Signed)
Requested medication (s) are due for refill today - unsure  Requested medication (s) are on the active medication list -yes  Future visit scheduled -yes  Last refill: 09/11/16  Notes to clinic: Request Rx - historical provider listed (note change in pharmacy)  Requested Prescriptions  Pending Prescriptions Disp Refills   ASPIRIN LOW DOSE 81 MG EC tablet [Pharmacy Med Name: Aspirin Low Dose 81 MG Tablet delayed release] 1 tablet 11    Sig: TAKE ONE TABLET BY MOUTH DAILY * DO NOT CRUSH * HARD COPY RX REQUIRED      Analgesics:  NSAIDS - aspirin Passed - 11/09/2020  9:58 AM      Passed - Patient is not pregnant      Passed - Valid encounter within last 12 months    Recent Outpatient Visits           2 months ago Essential hypertension   [facility] [place], Dionne Bucy, MD   9 months ago Essential hypertension   [facility] [place], Dionne Bucy, MD   10 months ago Pre-operative clearance   [facility] [place], Wendee Beavers, [place]   1 year ago Stage 3b chronic kidney disease   [facility] [place], Dionne Bucy, MD   1 year ago Dementia associated with other underlying disease with behavioral disturbance [facility])   [place], Dionne Bucy, MD       Future Appointments             In 3 days Bacigalupo, Dionne Bucy, MD [facility], [facility]   In 5 months [place], [place] [facility], [facility]   In 5 months Stoioff, Ronda Fairly, MD [facility]              Signed Prescriptions Disp Refills   ACETAMINOPHEN EXTRA STRENGTH 500 MG tablet 60 tablet 9    Sig: TAKE ONE TABLET BY MOUTH TWICE DAILY HARD COPY RX REQUIRED      Over the Counter:  OTC Passed - 11/09/2020  9:58 AM      Passed - Valid encounter within last 12 months    Recent Outpatient Visits           2 months ago Essential hypertension   [facility] [place],  Dionne Bucy, MD   9 months ago Essential hypertension   [facility], Dionne Bucy, MD   10 months ago Pre-operative clearance   [facility] [place], Wendee Beavers, [place]   1 year ago Stage 3b chronic kidney disease   [facility] [place], Dionne Bucy, MD   1 year ago Dementia associated with other underlying disease with behavioral disturbance [facility])   [place], Dionne Bucy, MD       Future Appointments             In 3 days Bacigalupo, Dionne Bucy, MD [facility], [place]   In 5 months Elna Breslow, Ples Specter, [place] for Infectious Disease, [facility]   In 5 months Stoioff, Ronda Fairly, MD [place]                 Requested Prescriptions  Pending Prescriptions Disp Refills   ASPIRIN LOW DOSE 81 MG EC tablet [Pharmacy Med Name: Aspirin Low Dose 81 MG Tablet delayed release] 1 tablet 11    Sig: TAKE ONE TABLET BY MOUTH DAILY * DO NOT CRUSH * HARD COPY  RX REQUIRED      Analgesics:  NSAIDS - aspirin Passed - 11/09/2020  9:58 AM      Passed - Patient is not pregnant      Passed - Valid encounter within last 12 months    Recent Outpatient Visits           2 months ago Essential hypertension   [facility] [place], Dionne Bucy, MD   9 months ago Essential hypertension   [facility], Dionne Bucy, MD   10 months ago Pre-operative clearance   [facility] Trinna Post, [place]   1 year ago Stage 3b chronic kidney disease   [facility], Dionne Bucy, MD   1 year ago Dementia associated with other underlying disease with behavioral disturbance [facility])   [place], Dionne Bucy, MD       Future Appointments             In 3 days Bacigalupo, Dionne Bucy, MD [facility], [facility]   In 5 months Elna Breslow, Ples Specter, [place] for Infectious Disease,  [facility]   In 5 months Stoioff, Ronda Fairly, MD [facility]              Signed Prescriptions Disp Refills   ACETAMINOPHEN EXTRA STRENGTH 500 MG tablet 60 tablet 9    Sig: TAKE ONE TABLET BY MOUTH TWICE DAILY HARD COPY RX REQUIRED      Over the Counter:  OTC Passed - 11/09/2020  9:58 AM      Passed - Valid encounter within last 12 months    Recent Outpatient Visits           2 months ago Essential hypertension   [place], Dionne Bucy, MD   9 months ago Essential hypertension   [facility], Dionne Bucy, MD   10 months ago Pre-operative clearance   [facility] [place], Wendee Beavers, [place]   1 year ago Stage 3b chronic kidney disease   [facility] [place], Dionne Bucy, MD   1 year ago Dementia associated with other underlying disease with behavioral disturbance [facility])   [place], Dionne Bucy, MD       Future Appointments             In 3 days Bacigalupo, Dionne Bucy, MD [facility], [place]   In 5 months [place], Ples Specter, [place] for Infectious Disease, [facility]   In 5 months Stoioff, Ronda Fairly, MD [place]

## 2020-11-12 ENCOUNTER — Ambulatory Visit: Payer: Medicare HMO | Admitting: Family Medicine

## 2020-11-12 ENCOUNTER — Ambulatory Visit (INDEPENDENT_AMBULATORY_CARE_PROVIDER_SITE_OTHER): Payer: Medicare HMO | Admitting: Family Medicine

## 2020-11-12 ENCOUNTER — Other Ambulatory Visit: Payer: Self-pay

## 2020-11-12 ENCOUNTER — Encounter: Payer: Self-pay | Admitting: Family Medicine

## 2020-11-12 VITALS — BP 166/90 | HR 72 | Temp 98.5°F | Resp 16 | Wt 135.0 lb

## 2020-11-12 DIAGNOSIS — I10 Essential (primary) hypertension: Secondary | ICD-10-CM

## 2020-11-12 DIAGNOSIS — F0390 Unspecified dementia without behavioral disturbance: Secondary | ICD-10-CM

## 2020-11-12 DIAGNOSIS — N1832 Chronic kidney disease, stage 3b: Secondary | ICD-10-CM | POA: Diagnosis not present

## 2020-11-12 NOTE — Assessment & Plan Note (Signed)
Reviewed recent metabolic panel Upcoming follow-up with nephrology Secondary to HIV and HTN and history of drug abuse Needs good blood pressure control

## 2020-11-12 NOTE — Assessment & Plan Note (Signed)
Elevated here, but Well controlled at other recent OVs Continue current medications Reviewed recent metabolic panel Upcoming Renal f/u

## 2020-11-12 NOTE — Patient Instructions (Signed)
Lavonia Dana, MD  Nephrology  NPI: [id number]  [place]  [place] [address]    Phone: [phone]  Fax: [phone]  6/8 at 9am

## 2020-11-12 NOTE — Assessment & Plan Note (Signed)
Dementia appears stable He has previously been mostly nonverbal, but today he answers questions with yes and no answers and laughs often Well cared for in a skilled facility

## 2020-11-12 NOTE — Progress Notes (Signed)
Established patient visit   Patient: Peter Cox   DOB: 04/18/1961   60 y.o. Male  MRN: [id number] Visit Date: 11/12/2020  Today's healthcare provider: Lavon Paganini, MD   Chief Complaint  Patient presents with  . Follow-up   Subjective    HPI  He reports he's doing well today. He is accompanied by his Care-driver.   Follow up for hypertension  The patient was last seen for this 2 months ago. Changes made at last visit include resume medications.  He reports excellent compliance with treatment. He feels that condition is Unchanged. He is not having side effects. He is compliant with his medications.   BP Readings from Last 3 Encounters:  11/12/20 (!) 166/90  11/05/20 136/82  11/04/20 138/80   -----------------------------------------------------------------------------------------   Patient Active Problem List   Diagnosis Date Noted  . Dysphagia 02/06/2020  . Physical deconditioning 02/06/2020  . Renal mass 01/01/2020  . Secondary hyperparathyroidism of renal origin ([place]) 01/01/2020  . Benign hypertensive kidney disease with chronic kidney disease 11/19/2019  . Proteinuria 11/19/2019  . History of substance abuse ([place]) 09/17/2018  . Urinary incontinence 09/17/2018  . Bilateral primary osteoarthritis of knee 09/13/2018  . Decreased activities of daily living (ADL) 09/13/2018  . Alcohol abuse 11/24/2014  . Dementia without behavioral disturbance ([place]) 07/02/2014  . History of CVA (cerebrovascular accident) 07/02/2014  . Stage 3b chronic kidney disease ([place]) 11/20/2008  . Human immunodeficiency virus (HIV) disease ([place]) 08/17/2008  . HLD (hyperlipidemia) 06/26/2008  . Dyslipidemia 06/26/2008  . Essential hypertension 06/24/2008   Social History   Tobacco Use  . Smoking status: Current Every Day Smoker    Packs/day: 0.50    Years: 40.00    Pack years: 20.00    Types: Cigarettes  . Smokeless tobacco: Never Used  . Tobacco comment: 3 cigarettes  per day  Vaping Use  . Vaping Use: Never used  Substance Use Topics  . Alcohol use: Not Currently    Alcohol/week: 0.0 standard drinks    Comment: drink beer  . Drug use: Not Currently    Frequency: 7.0 times per week    Types: Marijuana    Comment: use Marijuana every day per pt.   No Known Allergies     Medications: Outpatient Medications Prior to Visit  Medication Sig  . ACETAMINOPHEN EXTRA STRENGTH 500 MG tablet TAKE ONE TABLET BY MOUTH TWICE DAILY HARD COPY RX REQUIRED  . amLODipine (NORVASC) 10 MG tablet Take 1 tablet (10 mg total) by mouth daily.  . ASPIRIN LOW DOSE 81 MG EC tablet TAKE ONE TABLET BY MOUTH DAILY * DO NOT CRUSH * HARD COPY RX REQUIRED  . atorvastatin (LIPITOR) 20 MG tablet Take 1 tablet (20 mg total) by mouth daily.  . bictegravir-emtricitabine-tenofovir AF (BIKTARVY) 50-200-25 MG TABS tablet Take 1 tablet by mouth daily.  . divalproex (DEPAKOTE SPRINKLE) 125 MG capsule TAKE 2 TABLETS BY MOUTH EVERY 12 HOURS  . donepezil (ARICEPT) 10 MG tablet Take 1 tablet (10 mg total) by mouth at bedtime. Take 1/2 tablet daily for 2 weeks, then increase to 1 tablet daily  . hydrALAZINE (APRESOLINE) 25 MG tablet Take 1 tablet (25 mg total) by mouth 3 (three) times daily.  . hydrocortisone 1 % ointment Apply 1 application topically 2 (two) times daily as needed for itching (hemorrhoids).  . labetalol (NORMODYNE) 100 MG tablet Take 1 tablet (100 mg total) by mouth 2 (two) times daily.  Marland Kitchen lisinopril (ZESTRIL) 20 MG tablet  Take 1 tablet (20 mg total) by mouth daily.  Marland Kitchen LORazepam (ATIVAN) 1 MG tablet Take 1 tablet (1 mg total) by mouth every 4 (four) hours as needed for anxiety.  Marland Kitchen OLANZapine (ZYPREXA) 5 MG tablet Take 1 tablet (5 mg total) by mouth daily as needed (agitation).  Marland Kitchen senna-docusate (SENOKOT-S) 8.6-50 MG tablet Take 1 tablet by mouth at bedtime as needed for moderate constipation.   No facility-administered medications prior to visit.    Review of Systems   Constitutional: Negative for chills, fatigue and fever.  HENT: Negative for ear pain, nosebleeds, rhinorrhea, sinus pressure, sinus pain and sore throat.   Eyes: Negative for pain.  Respiratory: Negative for cough, chest tightness, shortness of breath and wheezing.   Cardiovascular: Negative for chest pain, palpitations and leg swelling.  Gastrointestinal: Negative for abdominal pain, blood in stool, constipation, diarrhea, nausea and vomiting.  Genitourinary: Negative for flank pain, frequency, hematuria and urgency.  Musculoskeletal: Negative for back pain, neck pain and neck stiffness.  Neurological: Negative for dizziness, seizures, syncope, weakness, light-headedness, numbness and headaches.       Objective    BP (!) 166/90 Comment: Manual recheck  Pulse 72   Temp 98.5 F (36.9 C) (Oral)   Resp 16   Wt 135 lb (61.2 kg)   SpO2 100%   BMI 20.53 kg/m  BP Readings from Last 3 Encounters:  11/12/20 (!) 166/90  11/05/20 136/82  11/04/20 138/80   Wt Readings from Last 3 Encounters:  11/12/20 135 lb (61.2 kg)  11/05/20 134 lb (60.8 kg)  11/04/20 130 lb (59 kg)       Physical Exam Vitals reviewed.  Constitutional:      General: He is not in acute distress.    Appearance: Normal appearance. He is not diaphoretic.  HENT:     Head: Normocephalic and atraumatic.  Eyes:     General: No scleral icterus.    Conjunctiva/sclera: Conjunctivae normal.  Cardiovascular:     Rate and Rhythm: Normal rate and regular rhythm.     Pulses: Normal pulses.     Heart sounds: Normal heart sounds. No murmur heard.   Pulmonary:     Effort: Pulmonary effort is normal. No respiratory distress.     Breath sounds: Normal breath sounds. No wheezing or rhonchi.  Abdominal:     General: There is no distension.     Palpations: Abdomen is soft.     Tenderness: There is no abdominal tenderness.  Musculoskeletal:     Cervical back: Neck supple.     Right lower leg: No edema.     Left lower  leg: No edema.  Lymphadenopathy:     Cervical: No cervical adenopathy.  Skin:    General: Skin is warm and dry.     Capillary Refill: Capillary refill takes less than 2 seconds.     Findings: No rash.  Neurological:     Mental Status: He is alert. Mental status is at baseline.  Psychiatric:        Mood and Affect: Mood normal.        Behavior: Behavior normal.       No results found for any visits on 11/12/20.  Assessment & Plan     Problem List Items Addressed This Visit      Cardiovascular and Mediastinum   Essential hypertension - Primary (Chronic)    Elevated here, but Well controlled at other recent OVs Continue current medications Reviewed recent metabolic panel Upcoming Renal f/u  Nervous and Auditory   Dementia without behavioral disturbance (HCC)    Dementia appears stable He has previously been mostly nonverbal, but today he answers questions with yes and no answers and laughs often Well cared for in a skilled facility        Genitourinary   Stage 3b chronic kidney disease ([place])    Reviewed recent metabolic panel Upcoming follow-up with nephrology Secondary to HIV and HTN and history of drug abuse Needs good blood pressure control          Return in about 6 months (around 05/15/2021) for chronic disease f/u.       I,Essence Turner,acting as a Education administrator for Lavon Paganini, MD.,have documented all relevant documentation on the behalf of Lavon Paganini, MD,as directed by  Lavon Paganini, MD while in the presence of Lavon Paganini, MD.  I, Lavon Paganini, MD, have reviewed all documentation for this visit. The documentation on 11/12/20 for the exam, diagnosis, procedures, and orders are all accurate and complete.   Gor [place], Dionne Bucy, MD, MPH [facility]

## 2020-11-25 DIAGNOSIS — Z03818 Encounter for observation for suspected exposure to other biological agents ruled out: Secondary | ICD-10-CM | POA: Diagnosis not present

## 2020-11-30 ENCOUNTER — Other Ambulatory Visit (HOSPITAL_COMMUNITY): Payer: Self-pay

## 2020-12-04 ENCOUNTER — Telehealth: Payer: Self-pay

## 2020-12-04 ENCOUNTER — Other Ambulatory Visit (HOSPITAL_COMMUNITY): Payer: Self-pay

## 2020-12-04 ENCOUNTER — Other Ambulatory Visit: Payer: Self-pay | Admitting: Family

## 2020-12-04 NOTE — Telephone Encounter (Signed)
Received refill request from [facility] for patient's Biktarvy. Per chart, patient's PCP sent orders to [facility] assisted living for [place]. Called assisted living facility to confirm that they have prescription for patient's Biktarvy and that he is receiving his medication. Facility states there is no need to fill at [facility], will refuse request.   Beryle Flock, RN

## 2020-12-07 ENCOUNTER — Other Ambulatory Visit (HOSPITAL_COMMUNITY): Payer: Self-pay

## 2020-12-08 DIAGNOSIS — R32 Unspecified urinary incontinence: Secondary | ICD-10-CM | POA: Diagnosis not present

## 2020-12-15 DIAGNOSIS — R809 Proteinuria, unspecified: Secondary | ICD-10-CM | POA: Diagnosis not present

## 2020-12-15 DIAGNOSIS — N2581 Secondary hyperparathyroidism of renal origin: Secondary | ICD-10-CM | POA: Diagnosis not present

## 2020-12-15 DIAGNOSIS — I129 Hypertensive chronic kidney disease with stage 1 through stage 4 chronic kidney disease, or unspecified chronic kidney disease: Secondary | ICD-10-CM | POA: Diagnosis not present

## 2020-12-15 DIAGNOSIS — N2889 Other specified disorders of kidney and ureter: Secondary | ICD-10-CM | POA: Diagnosis not present

## 2020-12-15 DIAGNOSIS — N184 Chronic kidney disease, stage 4 (severe): Secondary | ICD-10-CM | POA: Diagnosis not present

## 2020-12-26 ENCOUNTER — Other Ambulatory Visit (HOSPITAL_COMMUNITY): Payer: Self-pay

## 2020-12-26 ENCOUNTER — Other Ambulatory Visit: Payer: Self-pay | Admitting: Family Medicine

## 2020-12-26 NOTE — Telephone Encounter (Signed)
Requested medication (s) are due for refill today: discontinued 5//3/22  Requested medication (s) are on the active medication list: yes  Last refill:  09/07/20 #90 3 refills  Future visit scheduled: yes  in 5 months   Notes to clinic:  not delegated per protocol. Medication remains on med list. Discontinued 10/20/20 by Dr. Brita Romp. Do you want to refill Rx?     Requested Prescriptions  Pending Prescriptions Disp Refills   OLANZapine (ZYPREXA) 5 MG tablet 30 tablet 3    Sig: TAKE 1 TABLET BY MOUTH DAILY AS NEEDED FOR AGITATION      Not Delegated - Psychiatry:  Antipsychotics - Second Generation (Atypical) - olanzapine Failed - 12/26/2020  2:59 PM      Failed - This refill cannot be delegated      Failed - Last BP in normal range    BP Readings from Last 1 Encounters:  11/12/20 (!) 166/90          Passed - ALT in normal range and within 360 days    ALT  Date Value Ref Range Status  11/05/2020 9 9 - 46 U/L Final          Passed - AST in normal range and within 360 days    AST  Date Value Ref Range Status  11/05/2020 10 10 - 35 U/L Final          Passed - Valid encounter within last 6 months    Recent Outpatient Visits           1 month ago Essential hypertension   [facility], Dionne Bucy, MD   3 months ago Essential hypertension   [place], Dionne Bucy, MD   10 months ago Essential hypertension   [facility] [place], Dionne Bucy, MD   12 months ago Pre-operative clearance   [place], [place]   1 year ago Stage 3b chronic kidney disease   [facility] Bacigalupo, Dionne Bucy, MD       Future Appointments             In 4 months [place], Ples Specter, [place] for Infectious Disease, [facility]   In 4 months Stoioff, Ronda Fairly, MD [place]   In 5 months Bacigalupo, Dionne Bucy, MD [facility], [place]

## 2020-12-29 ENCOUNTER — Other Ambulatory Visit (HOSPITAL_COMMUNITY): Payer: Self-pay

## 2020-12-29 MED ORDER — OLANZAPINE 5 MG PO TABS
ORAL_TABLET | ORAL | 3 refills | Status: DC
  Filled 2020-12-29: qty 30, 30d supply, fill #0
  Filled 2021-02-11 – 2021-03-10 (×2): qty 30, 30d supply, fill #1
  Filled 2021-05-04: qty 30, 30d supply, fill #2

## 2021-01-01 ENCOUNTER — Other Ambulatory Visit (HOSPITAL_COMMUNITY): Payer: Self-pay

## 2021-01-01 MED FILL — Divalproex Sodium Cap Delayed Release Sprinkle 125 MG: ORAL | 15 days supply | Qty: 60 | Fill #0 | Status: AC

## 2021-01-05 ENCOUNTER — Other Ambulatory Visit (HOSPITAL_COMMUNITY): Payer: Self-pay

## 2021-01-06 ENCOUNTER — Other Ambulatory Visit: Payer: Self-pay | Admitting: Family Medicine

## 2021-01-06 NOTE — Telephone Encounter (Signed)
Requested Prescriptions  Pending Prescriptions Disp Refills  . STIMULANT LAXATIVE 8.6-50 MG tablet [Pharmacy Med Name: Stimulant Laxative 8.6-50 MG Tablet] 1 tablet 11    Sig: TAKE ONE TABLET BY MOUTH AT BEDTIME AS NEEDED FOR MODERATE CONSTIPATION HARD COPY RX REQUIRED     Over the Counter:  OTC Passed - 01/06/2021  5:03 PM      Passed - Valid encounter within last 12 months    Recent Outpatient Visits          1 month ago Essential hypertension   [facility], Dionne Bucy, MD   4 months ago Essential hypertension   [facility] [place], Dionne Bucy, MD   11 months ago Essential hypertension   [place], Dionne Bucy, MD   1 year ago Pre-operative clearance   [facility] [place], [place]   1 year ago Stage 3b chronic kidney disease   [facility] Bacigalupo, Dionne Bucy, MD      Future Appointments            In 3 months Calone, Ples Specter, [place] for Infectious Disease, [facility]   In 4 months Stoioff, Ronda Fairly, MD [place]   In 4 months Bacigalupo, Dionne Bucy, MD [facility], [place]

## 2021-01-08 DIAGNOSIS — R32 Unspecified urinary incontinence: Secondary | ICD-10-CM | POA: Diagnosis not present

## 2021-01-18 ENCOUNTER — Other Ambulatory Visit: Payer: Self-pay | Admitting: Family Medicine

## 2021-01-18 ENCOUNTER — Other Ambulatory Visit (HOSPITAL_COMMUNITY): Payer: Self-pay

## 2021-01-18 MED ORDER — HYDRALAZINE HCL 25 MG PO TABS
25.0000 mg | ORAL_TABLET | Freq: Three times a day (TID) | ORAL | 3 refills | Status: DC
  Filled 2021-01-18: qty 270, 90d supply, fill #0
  Filled 2021-05-04: qty 270, 90d supply, fill #1

## 2021-01-19 ENCOUNTER — Other Ambulatory Visit (HOSPITAL_COMMUNITY): Payer: Self-pay

## 2021-01-20 ENCOUNTER — Other Ambulatory Visit (HOSPITAL_COMMUNITY): Payer: Self-pay

## 2021-01-26 ENCOUNTER — Ambulatory Visit: Payer: Medicare HMO | Admitting: Podiatry

## 2021-02-08 ENCOUNTER — Other Ambulatory Visit (HOSPITAL_COMMUNITY): Payer: Self-pay

## 2021-02-09 ENCOUNTER — Other Ambulatory Visit (HOSPITAL_COMMUNITY): Payer: Self-pay

## 2021-02-09 ENCOUNTER — Other Ambulatory Visit: Payer: Self-pay | Admitting: Family Medicine

## 2021-02-09 DIAGNOSIS — R32 Unspecified urinary incontinence: Secondary | ICD-10-CM | POA: Diagnosis not present

## 2021-02-09 MED ORDER — DIVALPROEX SODIUM 125 MG PO CSDR
DELAYED_RELEASE_CAPSULE | Freq: Two times a day (BID) | ORAL | 2 refills | Status: DC
  Filled 2021-02-09: qty 60, 15d supply, fill #0
  Filled 2021-03-10: qty 45, 11d supply, fill #1
  Filled 2021-03-10: qty 15, 4d supply, fill #1
  Filled 2021-05-04: qty 60, 15d supply, fill #2

## 2021-02-09 NOTE — Telephone Encounter (Signed)
Requested medication (s) are due for refill today: yes  Requested medication (s) are on the active medication list: yes  Last refill:  01/05/2021  Future visit scheduled: yes   Notes to clinic:  this refill cannot be delegated    Requested Prescriptions  Pending Prescriptions Disp Refills   divalproex (DEPAKOTE SPRINKLE) 125 MG capsule 60 capsule 2    Sig: TAKE 2 TABLETS BY MOUTH EVERY 12 HOURS     Not Delegated - Neurology:  Anticonvulsants - Valproates Failed - 02/09/2021 11:54 AM      Failed - This refill cannot be delegated      Passed - AST in normal range and within 360 days    AST  Date Value Ref Range Status  11/05/2020 10 10 - 35 U/L Final          Passed - ALT in normal range and within 360 days    ALT  Date Value Ref Range Status  11/05/2020 9 9 - 46 U/L Final          Passed - HGB in normal range and within 360 days    Hemoglobin  Date Value Ref Range Status  09/28/2020 13.0 13.0 - 17.0 g/dL Final          Passed - PLT in normal range and within 360 days    Platelets  Date Value Ref Range Status  09/28/2020 157 150 - 400 K/uL Final          Passed - WBC in normal range and within 360 days    WBC  Date Value Ref Range Status  09/28/2020 4.4 4.0 - 10.5 K/uL Final          Passed - HCT in normal range and within 360 days    HCT  Date Value Ref Range Status  09/28/2020 40.1 39.0 - 52.0 % Final          Passed - Valproic Acid (serum) in normal range and within 360 days    Valproic Acid Lvl  Date Value Ref Range Status  07/07/2020 56 50.0 - 100.0 ug/mL Final    Comment:    Performed at [facility], [address] [place], [place] [address]          Passed - Valid encounter within last 12 months    Recent Outpatient Visits           2 months ago Essential hypertension   [facility] [place], Dionne Bucy, MD   5 months ago Essential hypertension   [facility], Dionne Bucy, MD   1 year ago  Essential hypertension   [place], Dionne Bucy, MD   1 year ago Pre-operative clearance   [facility] [place], [place]   1 year ago Stage 3b chronic kidney disease   [facility] Bacigal[place], Dionne Bucy, MD       Future Appointments             In 2 months Calone, Ples Specter, [place] for Infectious Disease, [facility]   In 2 months Stoioff, Ronda Fairly, MD [place]   In 3 months Bacigalupo, Dionne Bucy, MD [facility], [place]

## 2021-02-11 ENCOUNTER — Other Ambulatory Visit (HOSPITAL_COMMUNITY): Payer: Self-pay

## 2021-02-12 ENCOUNTER — Telehealth: Payer: Self-pay | Admitting: Family Medicine

## 2021-02-12 NOTE — Telephone Encounter (Signed)
Kristeen Miss reports that patient went of the property alone around 10:30 am -11am. They believe patient was going to the store. Patient was found safe and unharmed. Patient is currently with his family. Patient will be going back to [facility] later on. They report patient has walked off alone in the past will go out side and sit in the rocking chair and then wonder off.

## 2021-02-12 NOTE — Telephone Encounter (Signed)
Kristeen Miss called to report that the pt had an Elopement / please advise

## 2021-02-15 ENCOUNTER — Other Ambulatory Visit (HOSPITAL_COMMUNITY): Payer: Self-pay

## 2021-02-15 ENCOUNTER — Telehealth: Payer: Self-pay | Admitting: Family Medicine

## 2021-02-15 MED ORDER — SENNA-DOCUSATE SODIUM 8.6-50 MG PO TABS: 1.0000 | ORAL_TABLET | Freq: Every day | ORAL | 3 refills | Status: AC

## 2021-02-15 NOTE — Telephone Encounter (Signed)
Noted  

## 2021-02-15 NOTE — Telephone Encounter (Signed)
[facility] faxed refill request for the following medications:   senna-docusate (SENOKOT-S) 8.6-50 MG tablet   Please advise.

## 2021-02-19 ENCOUNTER — Other Ambulatory Visit (HOSPITAL_COMMUNITY): Payer: Self-pay

## 2021-02-25 ENCOUNTER — Telehealth: Payer: Self-pay | Admitting: Family Medicine

## 2021-02-25 NOTE — Telephone Encounter (Signed)
2 hours seems fine for now. I think we could change it if he starts to wander more. No need to report to [name]

## 2021-02-25 NOTE — Telephone Encounter (Signed)
Dr. Luan Pulling, from [facility] Dept of regulation, calling on behalf of pt. She states that she is needing to clarification on the pts medication, olanzapine. She states that there are 2 prescriptions, one for once daily and one for PRN. She is requesting to know which one pt should be receiving. She also states that the pt is showing that the Morphine is on pts med list as well. She states that she has been advised that PCP is requesting to know the pts BP levels. She is requesting to know if PCP is wanting all BP levels, or just the systolic ones greater than 180. She states that the pt is leaving the facility for short periods of time as well, to smoke, etc, but the pt has a history of dementia and she is requesting to know if this is safe. Please advise.     [phone]

## 2021-02-25 NOTE — Telephone Encounter (Signed)
Should be daily Olazapine. Only need to know BP >471 systolic, not all. He does have a history of elopement, so not sure that leaving without supervision is safe

## 2021-03-08 ENCOUNTER — Ambulatory Visit (INDEPENDENT_AMBULATORY_CARE_PROVIDER_SITE_OTHER): Payer: Medicare HMO | Admitting: Podiatry

## 2021-03-08 ENCOUNTER — Other Ambulatory Visit: Payer: Self-pay

## 2021-03-08 ENCOUNTER — Encounter: Payer: Self-pay | Admitting: Podiatry

## 2021-03-08 DIAGNOSIS — F0390 Unspecified dementia without behavioral disturbance: Secondary | ICD-10-CM | POA: Diagnosis not present

## 2021-03-08 DIAGNOSIS — B20 Human immunodeficiency virus [HIV] disease: Secondary | ICD-10-CM

## 2021-03-08 DIAGNOSIS — B351 Tinea unguium: Secondary | ICD-10-CM | POA: Diagnosis not present

## 2021-03-08 DIAGNOSIS — N1832 Chronic kidney disease, stage 3b: Secondary | ICD-10-CM

## 2021-03-08 DIAGNOSIS — M79675 Pain in left toe(s): Secondary | ICD-10-CM | POA: Diagnosis not present

## 2021-03-08 DIAGNOSIS — M79674 Pain in right toe(s): Secondary | ICD-10-CM | POA: Diagnosis not present

## 2021-03-08 NOTE — Progress Notes (Signed)
This patient returns to my office for at risk foot care.  This patient requires this care by a professional since this patient will be at risk due to having CKD, DM, AIDS and CVA.  This patient is unable to cut nails himself since the patient cannot reach his nails.These nails are painful walking and wearing shoes.  This patient presents for at risk foot care today.  General Appearance  Alert, conversant and in no acute stress.  Vascular  Dorsalis pedis and posterior tibial  pulses are palpable  bilaterally.  Capillary return is within normal limits  bilaterally. Temperature is within normal limits  bilaterally.  Neurologic  Senn-Weinstein monofilament wire test within normal limits  bilaterally. Muscle power within normal limits bilaterally.  Nails Thick disfigured discolored nails with subungual debris  from hallux to fifth toes bilaterally. No evidence of bacterial infection or drainage bilaterally.  Orthopedic  No limitations of motion  feet .  No crepitus or effusions noted.  No bony pathology or digital deformities noted.  Skin  normotropic skin with no porokeratosis noted bilaterally.  No signs of infections or ulcers noted.     Onychomycosis  Pain in right toes  Pain in left toes  Consent was obtained for treatment procedures.   Mechanical debridement of nails 1-5  bilaterally performed with a nail nipper.  Filed with dremel without incident.    Return office visit   4 months                   Told patient to return for periodic foot care and evaluation due to potential at risk complications.   Gardiner Barefoot DPM

## 2021-03-10 ENCOUNTER — Other Ambulatory Visit: Payer: Self-pay | Admitting: Family Medicine

## 2021-03-10 ENCOUNTER — Other Ambulatory Visit (HOSPITAL_COMMUNITY): Payer: Self-pay

## 2021-03-10 NOTE — Telephone Encounter (Signed)
Requested medication (s) are due for refill today - unsure  Requested medication (s) are on the active medication list -yes  Future visit scheduled -yes  Last refill: 09/07/20 #90 1RF  Notes to clinic: Medication listed as discontinued on request- but still on current medication list- sent for review   Requested Prescriptions  Pending Prescriptions Disp Refills   donepezil (ARICEPT) 10 MG tablet 90 tablet 1    Sig: TAKE 1/2 TABLET BY MOUTH ONCE A DAY AT BEDTIME FOR 2 WEEKS THEN INCREASE TO 1 TABLET DAILY AT BEDTIME     Neurology:  Alzheimer's Agents Passed - 03/10/2021  1:30 PM      Passed - Valid encounter within last 6 months    Recent Outpatient Visits           3 months ago Essential hypertension   [place], Dionne Bucy, MD   6 months ago Essential hypertension   [place], Dionne Bucy, MD   1 year ago Essential hypertension   [place], Dionne Bucy, MD   1 year ago Pre-operative clearance   [facility] [place], [place]   1 year ago Stage 3b chronic kidney disease   [place] Bacigalupo, Dionne Bucy, MD       Future Appointments             In 1 month [place], [place] for Infectious Disease, [facility]   In 1 month Stoioff, Ronda Fairly, MD [place]   In 2 months Bacigalupo, Dionne Bucy, MD [facility], [place]               Requested Prescriptions  Pending Prescriptions Disp Refills   donepezil (ARICEPT) 10 MG tablet 90 tablet 1    Sig: TAKE 1/2 TABLET BY MOUTH ONCE A DAY AT BEDTIME FOR 2 WEEKS THEN INCREASE TO 1 TABLET DAILY AT BEDTIME     Neurology:  Alzheimer's Agents Passed - 03/10/2021  1:30 PM      Passed - Valid encounter within last 6 months    Recent Outpatient Visits           3 months ago Essential hypertension   [facility] [place], Dionne Bucy, MD   6 months ago  Essential hypertension   [place], Dionne Bucy, MD   1 year ago Essential hypertension   [place], Dionne Bucy, MD   1 year ago Pre-operative clearance   [facility] [place], [place]   1 year ago Stage 3b chronic kidney disease   [facility] [place], Dionne Bucy, MD       Future Appointments             In 1 month Calone, Ples Specter, [place] for Infectious Disease, [facility]   In 1 month Stoioff, Ronda Fairly, MD [place]   In 2 months Bacigalupo, Dionne Bucy, MD [facility], [place]

## 2021-03-11 ENCOUNTER — Telehealth: Payer: Self-pay | Admitting: *Deleted

## 2021-03-11 ENCOUNTER — Other Ambulatory Visit (HOSPITAL_COMMUNITY): Payer: Self-pay

## 2021-03-11 MED ORDER — DONEPEZIL HCL 10 MG PO TABS
ORAL_TABLET | ORAL | 1 refills | Status: DC
  Filled 2021-03-11: qty 90, 90d supply, fill #0
  Filled 2021-05-04: qty 90, 90d supply, fill #1

## 2021-03-11 NOTE — Telephone Encounter (Signed)
Copied from [place] [phone]. Topic: General - Other >> Mar 10, 2021  1:01 PM Yvette Rack wrote: Reason for CRM: Delma Freeze, Med Tech with [facility], stated he was informed by the pharmacy that the Rx for amLODipine (NORVASC) 10 MG tablet had been discontinued. Legrand Como stated they would need documentation of medication being discontinued so it can be removed from pt med list. Legrand Como asked that the documentation be faxed to [phone]. Cb# [phone]

## 2021-03-12 ENCOUNTER — Other Ambulatory Visit (HOSPITAL_COMMUNITY): Payer: Self-pay

## 2021-03-12 DIAGNOSIS — R32 Unspecified urinary incontinence: Secondary | ICD-10-CM | POA: Diagnosis not present

## 2021-03-15 ENCOUNTER — Other Ambulatory Visit (HOSPITAL_COMMUNITY): Payer: Self-pay

## 2021-03-18 DIAGNOSIS — F0391 Unspecified dementia with behavioral disturbance: Secondary | ICD-10-CM | POA: Diagnosis not present

## 2021-03-18 DIAGNOSIS — B351 Tinea unguium: Secondary | ICD-10-CM | POA: Diagnosis not present

## 2021-03-18 DIAGNOSIS — L603 Nail dystrophy: Secondary | ICD-10-CM | POA: Diagnosis not present

## 2021-03-18 DIAGNOSIS — R269 Unspecified abnormalities of gait and mobility: Secondary | ICD-10-CM | POA: Diagnosis not present

## 2021-03-18 DIAGNOSIS — M79673 Pain in unspecified foot: Secondary | ICD-10-CM | POA: Diagnosis not present

## 2021-04-06 ENCOUNTER — Other Ambulatory Visit (HOSPITAL_COMMUNITY): Payer: Self-pay

## 2021-04-12 DIAGNOSIS — R32 Unspecified urinary incontinence: Secondary | ICD-10-CM | POA: Diagnosis not present

## 2021-04-26 ENCOUNTER — Ambulatory Visit: Payer: Medicare HMO | Attending: Urology

## 2021-04-30 ENCOUNTER — Ambulatory Visit: Payer: Medicare HMO | Admitting: Family

## 2021-05-04 ENCOUNTER — Telehealth: Payer: Self-pay

## 2021-05-04 ENCOUNTER — Other Ambulatory Visit (HOSPITAL_COMMUNITY): Payer: Self-pay

## 2021-05-04 NOTE — Telephone Encounter (Signed)
Copied from [place] [phone]. Topic: General - Other >> May 04, 2021 12:04 PM Pawlus, Brayton Layman A wrote: Reason for CRM: Caller stated the pt is moving to a new group home, caller stated she will need to pick up his list of medications and any orders to provide the new group home FYI

## 2021-05-04 NOTE — Telephone Encounter (Signed)
NA mailbox is full.

## 2021-05-07 ENCOUNTER — Ambulatory Visit: Payer: Self-pay | Admitting: Urology

## 2021-05-10 ENCOUNTER — Ambulatory Visit: Payer: Medicare HMO

## 2021-05-11 NOTE — Telephone Encounter (Signed)
Patient's daughter reports she will pick up list at next ov.

## 2021-05-18 ENCOUNTER — Other Ambulatory Visit: Payer: Self-pay

## 2021-05-18 DIAGNOSIS — F0390 Unspecified dementia without behavioral disturbance: Secondary | ICD-10-CM

## 2021-05-18 DIAGNOSIS — I10 Essential (primary) hypertension: Secondary | ICD-10-CM

## 2021-05-20 ENCOUNTER — Other Ambulatory Visit (HOSPITAL_COMMUNITY): Payer: Self-pay

## 2021-05-20 ENCOUNTER — Ambulatory Visit: Payer: Medicare HMO | Admitting: Family

## 2021-05-24 ENCOUNTER — Other Ambulatory Visit (HOSPITAL_COMMUNITY): Payer: Self-pay

## 2021-05-24 ENCOUNTER — Telehealth: Payer: Self-pay

## 2021-05-24 DIAGNOSIS — B20 Human immunodeficiency virus [HIV] disease: Secondary | ICD-10-CM

## 2021-05-24 MED ORDER — BIKTARVY 50-200-25 MG PO TABS
1.0000 | ORAL_TABLET | Freq: Every day | ORAL | 0 refills | Status: DC
  Filled 2021-05-24 – 2021-06-01 (×2): qty 30, 30d supply, fill #0

## 2021-05-24 NOTE — Telephone Encounter (Signed)
Spoke with patient's daughter, she states he is out of the facility and back home. She would like the refill sent to [facility]. Advised that 30 days will be sent in to get him to his next appointment.   Beryle Flock, RN

## 2021-05-24 NOTE — Telephone Encounter (Signed)
Patients daughter Peter Cox called to reschedule an appointment missed, was told this appointment could be done over the phone. First available afternoon appointment is 06/22/21 at 4 pm. Patient only has 5 day left of medication and want to get a refill that will last until the appointment date. Best contact number is [phone] with Daughter Peter Cox, patient is in nursing home

## 2021-05-25 ENCOUNTER — Other Ambulatory Visit (HOSPITAL_COMMUNITY): Payer: Self-pay

## 2021-05-26 NOTE — Progress Notes (Signed)
Established patient visit   Patient: Peter Cox   DOB: 20-Dec-1960   60 y.o. Male  MRN: [id number] Visit Date: 05/27/2021  Today's healthcare provider: Lavon Paganini, MD   Chief Complaint  Patient presents with   Follow-up    HTN   Subjective    HPI Patient here with [name] on [facility]. HPI     Follow-up    Additional comments: HTN      Last edited by Doristine Devoid, CMA on 05/27/2021  2:23 PM.      Hypertension, follow-up  BP Readings from Last 3 Encounters:  05/27/21 (!) 149/88  11/12/20 (!) 166/90  11/05/20 136/82   Wt Readings from Last 3 Encounters:  05/27/21 133 lb 8 oz (60.6 kg)  11/12/20 135 lb (61.2 kg)  11/05/20 134 lb (60.8 kg)     He was last seen for hypertension 6 months ago.  BP at that visit was 166/90. Management since that visit includes continue current medications.  He reports excellent compliance with treatment. He is not having side effects.    Outside blood pressures are not being checked. Symptoms: No chest pain No chest pressure  No palpitations No syncope  No dyspnea No orthopnea  No paroxysmal nocturnal dyspnea No lower extremity edema   Pertinent labs: Lab Results  Component Value Date   CHOL 145 11/05/2020   HDL 42 11/05/2020   LDLCALC 79 11/05/2020   TRIG 143 11/05/2020   CHOLHDL 3.5 11/05/2020   Lab Results  Component Value Date   NA 142 11/05/2020   K 4.2 11/05/2020   CREATININE 2.37 (H) 11/05/2020   GFRNONAA 28 (L) 11/05/2020   GLUCOSE 112 (H) 11/05/2020   TSH 0.734 07/02/2020     The 10-year ASCVD risk score (Arnett DK, et al., 2019) is: 29.3%   ---------------------------------------------------------------------------------------------------   Medications: Outpatient Medications Prior to Visit  Medication Sig   ACETAMINOPHEN EXTRA STRENGTH 500 MG tablet TAKE ONE TABLET BY MOUTH TWICE DAILY HARD COPY RX REQUIRED   ASPIRIN LOW DOSE 81 MG EC tablet TAKE ONE TABLET BY MOUTH DAILY * DO NOT  CRUSH * HARD COPY RX REQUIRED   bictegravir-emtricitabine-tenofovir AF (BIKTARVY) 50-200-25 MG TABS tablet Take 1 tablet by mouth daily.   hydrocortisone 1 % ointment Apply 1 application topically 2 (two) times daily as needed for itching (hemorrhoids).   sennosides-docusate sodium (SENOKOT-S) 8.6-50 MG tablet Take 1 tablet by mouth daily.   STIMULANT LAXATIVE 8.6-50 MG tablet TAKE ONE TABLET BY MOUTH AT BEDTIME AS NEEDED FOR MODERATE CONSTIPATION HARD COPY RX REQUIRED   [DISCONTINUED] amLODipine (NORVASC) 10 MG tablet Take 1 tablet (10 mg total) by mouth daily.   [DISCONTINUED] atorvastatin (LIPITOR) 20 MG tablet Take 1 tablet (20 mg total) by mouth daily.   [DISCONTINUED] divalproex (DEPAKOTE SPRINKLE) 125 MG capsule TAKE 2 CAPSULES BY MOUTH EVERY 12 HOURS   [DISCONTINUED] donepezil (ARICEPT) 10 MG tablet Take 1 tablet (10 mg total) by mouth at bedtime. Take 1/2 tablet daily for 2 weeks, then increase to 1 tablet daily   [DISCONTINUED] donepezil (ARICEPT) 10 MG tablet TAKE 1/2 TABLET BY MOUTH ONCE A DAY AT BEDTIME FOR 2 WEEKS THEN INCREASE TO 1 TABLET DAILY AT BEDTIME   [DISCONTINUED] hydrALAZINE (APRESOLINE) 25 MG tablet Take 1 tablet by mouth 3 times daily.   [DISCONTINUED] labetalol (NORMODYNE) 100 MG tablet Take 1 tablet (100 mg total) by mouth 2 (two) times daily.   [DISCONTINUED] lisinopril (ZESTRIL) 20 MG tablet Take  1 tablet (20 mg total) by mouth daily.   [DISCONTINUED] LORazepam (ATIVAN) 1 MG tablet Take 1 tablet (1 mg total) by mouth every 4 (four) hours as needed for anxiety.   [DISCONTINUED] OLANZapine (ZYPREXA) 5 MG tablet Take 1 tablet (5 mg total) by mouth daily as needed (agitation).   [DISCONTINUED] OLANZapine (ZYPREXA) 5 MG tablet TAKE 1 TABLET BY MOUTH DAILY AS NEEDED FOR AGITATION   No facility-administered medications prior to visit.    Review of Systemsper HPI     Objective    BP (!) 149/88 (BP Location: Left Arm, Patient Position: Sitting, Cuff Size: Normal)    Pulse 73   Temp 98.1 F (36.7 C) (Oral)   Resp 16   Wt 133 lb 8 oz (60.6 kg)   BMI 20.30 kg/m  {Show previous vital signs (optional):23777}  Physical Exam Vitals reviewed.  Constitutional:      General: He is not in acute distress.    Appearance: Normal appearance.  HENT:     Head: Normocephalic and atraumatic.     Nose: Rhinorrhea present. No congestion.     Mouth/Throat:     Mouth: Mucous membranes are moist.     Pharynx: No oropharyngeal exudate.  Eyes:     Conjunctiva/sclera: Conjunctivae normal.     Pupils: Pupils are equal, round, and reactive to light.  Cardiovascular:     Rate and Rhythm: Normal rate and regular rhythm.     Heart sounds: Normal heart sounds. No murmur heard. Pulmonary:     Effort: Pulmonary effort is normal. No respiratory distress.     Breath sounds: Normal breath sounds. No wheezing.  Abdominal:     General: There is no distension.     Palpations: Abdomen is soft.     Tenderness: There is no abdominal tenderness.  Musculoskeletal:     Cervical back: Neck supple.     Right lower leg: No edema.     Left lower leg: No edema.  Lymphadenopathy:     Cervical: No cervical adenopathy.  Skin:    General: Skin is warm and dry.     Findings: No rash.  Neurological:     Mental Status: He is alert. Mental status is at baseline.     Comments: Non verbal except for giggling when asked a question Shuffling gait      No results found for any visits on 05/27/21.  Assessment & Plan     Problem List Items Addressed This Visit       Cardiovascular and Mediastinum   Essential hypertension (Chronic)    Chronic and uncontrolled Encouraged checking home blood pressure regularly Increase lisinopril to 40 mg daily Continue other medications Reviewed recent metabolic panel, which will likely be rechecked at next nephrology visit      Relevant Medications   amLODipine (NORVASC) 10 MG tablet   atorvastatin (LIPITOR) 20 MG tablet   hydrALAZINE  (APRESOLINE) 25 MG tablet   labetalol (NORMODYNE) 100 MG tablet   lisinopril (ZESTRIL) 40 MG tablet     Digestive   Sialorrhea    New problem, but longstanding Trial of scopolamine patch, but warned about potential aggression and dizziness        Nervous and Auditory   Dementia with behavioral disturbance    Dementia appears stable He is back to living at home and no longer in a skilled facility Being cared for by his daughters He is mostly nonverbal and giggles and answered any question Behavior issues seem to have resolved  and he is no longer a flight risk He was previously followed by psychiatrist who had prescribed the olanzapine, lorazepam, Depakote We have continued these while he was in skilled facility and mostly unable to see a psychiatrist, but I expressed to his daughter today that it would be good for him to resume seeing psychiatry as I believe they are more equipped to manage his medications long-term Referral placed today      Relevant Medications   divalproex (DEPAKOTE SPRINKLE) 125 MG capsule   donepezil (ARICEPT) 10 MG tablet   OLANZapine (ZYPREXA) 5 MG tablet   LORazepam (ATIVAN) 1 MG tablet   Other Relevant Orders   Ambulatory referral to Psychiatry     Genitourinary   Stage 3b chronic kidney disease ([place])    Followed by nephrology Secondary to HIV and HTN and history of drug abuse Needs good blood pressure control as above        Other   Human immunodeficiency virus (HIV) disease ([place]) (Chronic)    Has resumed following closely with infectious disease and is taking his ART medication with good compliance      HLD (hyperlipidemia) - Primary (Chronic)    Previously well controlled Continue statin Reviewed FLP and CMP-recheck at next visit Goal LDL less than 100      Relevant Medications   amLODipine (NORVASC) 10 MG tablet   atorvastatin (LIPITOR) 20 MG tablet   hydrALAZINE (APRESOLINE) 25 MG tablet   labetalol (NORMODYNE) 100 MG tablet    lisinopril (ZESTRIL) 40 MG tablet     Return in about 3 months (around 08/25/2021) for chronic disease f/u.      I, Lavon Paganini, MD, have reviewed all documentation for this visit. The documentation on 05/27/21 for the exam, diagnosis, procedures, and orders are all accurate and complete.   Hiroko Tregre, Dionne Bucy, MD, MPH [facility]

## 2021-05-27 ENCOUNTER — Other Ambulatory Visit: Payer: Self-pay

## 2021-05-27 ENCOUNTER — Other Ambulatory Visit (HOSPITAL_COMMUNITY): Payer: Self-pay

## 2021-05-27 ENCOUNTER — Encounter: Payer: Self-pay | Admitting: Family Medicine

## 2021-05-27 ENCOUNTER — Ambulatory Visit (INDEPENDENT_AMBULATORY_CARE_PROVIDER_SITE_OTHER): Payer: Medicare HMO | Admitting: Family Medicine

## 2021-05-27 VITALS — BP 149/88 | HR 73 | Temp 98.1°F | Resp 16 | Wt 133.5 lb

## 2021-05-27 DIAGNOSIS — E782 Mixed hyperlipidemia: Secondary | ICD-10-CM

## 2021-05-27 DIAGNOSIS — I10 Essential (primary) hypertension: Secondary | ICD-10-CM | POA: Diagnosis not present

## 2021-05-27 DIAGNOSIS — B20 Human immunodeficiency virus [HIV] disease: Secondary | ICD-10-CM | POA: Diagnosis not present

## 2021-05-27 DIAGNOSIS — N1832 Chronic kidney disease, stage 3b: Secondary | ICD-10-CM

## 2021-05-27 DIAGNOSIS — F03918 Unspecified dementia, unspecified severity, with other behavioral disturbance: Secondary | ICD-10-CM

## 2021-05-27 DIAGNOSIS — K117 Disturbances of salivary secretion: Secondary | ICD-10-CM

## 2021-05-27 MED ORDER — LORAZEPAM 1 MG PO TABS
1.0000 mg | ORAL_TABLET | ORAL | 4 refills | Status: AC | PRN
  Filled 2021-05-27: qty 20, 6d supply, fill #0

## 2021-05-27 MED ORDER — DONEPEZIL HCL 10 MG PO TABS
10.0000 mg | ORAL_TABLET | Freq: Every day | ORAL | 1 refills | Status: AC
  Filled 2021-05-27: qty 90, 90d supply, fill #0

## 2021-05-27 MED ORDER — LABETALOL HCL 100 MG PO TABS
100.0000 mg | ORAL_TABLET | Freq: Two times a day (BID) | ORAL | 1 refills | Status: AC
  Filled 2021-05-27: qty 180, 90d supply, fill #0

## 2021-05-27 MED ORDER — HYDRALAZINE HCL 25 MG PO TABS
25.0000 mg | ORAL_TABLET | Freq: Three times a day (TID) | ORAL | 3 refills | Status: AC
  Filled 2021-05-27: qty 270, 90d supply, fill #0

## 2021-05-27 MED ORDER — LISINOPRIL 40 MG PO TABS
40.0000 mg | ORAL_TABLET | Freq: Every day | ORAL | 1 refills | Status: AC
  Filled 2021-05-27: qty 90, 90d supply, fill #0

## 2021-05-27 MED ORDER — ATORVASTATIN CALCIUM 20 MG PO TABS
20.0000 mg | ORAL_TABLET | Freq: Every day | ORAL | 1 refills | Status: AC
  Filled 2021-05-27: qty 90, 90d supply, fill #0

## 2021-05-27 MED ORDER — DIVALPROEX SODIUM 125 MG PO CSDR
DELAYED_RELEASE_CAPSULE | Freq: Two times a day (BID) | ORAL | 1 refills | Status: AC
  Filled 2021-05-27: qty 180, 45d supply, fill #0

## 2021-05-27 MED ORDER — AMLODIPINE BESYLATE 10 MG PO TABS
10.0000 mg | ORAL_TABLET | Freq: Every day | ORAL | 3 refills | Status: AC
  Filled 2021-05-27: qty 90, 90d supply, fill #0

## 2021-05-27 MED ORDER — SCOPOLAMINE 1 MG/3DAYS TD PT72
1.0000 | MEDICATED_PATCH | TRANSDERMAL | 12 refills | Status: AC
  Filled 2021-05-27: qty 10, 30d supply, fill #0
  Filled 2021-06-29: qty 10, 30d supply, fill #1

## 2021-05-27 MED ORDER — OLANZAPINE 5 MG PO TABS
5.0000 mg | ORAL_TABLET | Freq: Every day | ORAL | 3 refills | Status: AC | PRN
  Filled 2021-05-27: qty 90, 90d supply, fill #0

## 2021-05-27 NOTE — Assessment & Plan Note (Signed)
Previously well controlled Continue statin Reviewed FLP and CMP-recheck at next visit Goal LDL less than 100

## 2021-05-27 NOTE — Assessment & Plan Note (Signed)
Chronic and uncontrolled Encouraged checking home blood pressure regularly Increase lisinopril to 40 mg daily Continue other medications Reviewed recent metabolic panel, which will likely be rechecked at next nephrology visit

## 2021-05-27 NOTE — Assessment & Plan Note (Signed)
New problem, but longstanding Trial of scopolamine patch, but warned about potential aggression and dizziness

## 2021-05-27 NOTE — Assessment & Plan Note (Signed)
Has resumed following closely with infectious disease and is taking his ART medication with good compliance

## 2021-05-27 NOTE — Assessment & Plan Note (Signed)
Dementia appears stable He is back to living at home and no longer in a skilled facility Being cared for by his daughters He is mostly nonverbal and giggles and answered any question Behavior issues seem to have resolved and he is no longer a flight risk He was previously followed by psychiatrist who had prescribed the olanzapine, lorazepam, Depakote We have continued these while he was in skilled facility and mostly unable to see a psychiatrist, but I expressed to his daughter today that it would be good for him to resume seeing psychiatry as I believe they are more equipped to manage his medications long-term Referral placed today

## 2021-05-27 NOTE — Assessment & Plan Note (Signed)
Followed by nephrology Secondary to HIV and HTN and history of drug abuse Needs good blood pressure control as above

## 2021-05-28 ENCOUNTER — Other Ambulatory Visit (HOSPITAL_COMMUNITY): Payer: Self-pay

## 2021-06-01 ENCOUNTER — Other Ambulatory Visit (HOSPITAL_COMMUNITY): Payer: Self-pay

## 2021-06-07 ENCOUNTER — Telehealth: Payer: Self-pay | Admitting: *Deleted

## 2021-06-07 NOTE — Telephone Encounter (Signed)
° ° °  Telephone encounter was:  Successful.  06/07/2021 Name: [name] MRN: [id number] DOB: December 25, 1960  Peter Cox is a 60 y.o. year old male who is a primary care patient of Bacigalupo, Dionne Bucy, MD . The community resource team was consulted for assistance with Patient daughter needs the LCSW to  contact a medicaid service to get his diapers and wipes delivered to new address as he has left the group home   Care guide performed the following interventions: Patient provided with information about care guide support team and interviewed to confirm resource needs Follow up call placed to community resources to determine status of patients referral.  Follow Up Plan:  No further follow up planned at this time. The patient has been provided with needed resources.  [place], Care Management  [phone] [address] , [place] [address] Email : [email or web address]

## 2021-06-07 NOTE — Chronic Care Management (AMB) (Signed)
°  Chronic Care Management   Outreach Note  06/07/2021 Name: Peter Cox MRN: [id number] DOB: 01-20-1961  Peter Cox is a 60 y.o. year old male who is a primary care patient of Brita Romp, Dionne Bucy, MD. I reached out to [name] by phone today in response to a referral sent by Peter Cox's primary care provider.  An unsuccessful telephone outreach was attempted today. The patient was referred to the case management team for assistance with care management and care coordination.   Follow Up Plan: A HIPAA compliant phone message was left for the patient providing contact information and requesting a return call.  If patient returns call to provider office, please advise to call Embedded Care Management Care Guide Peter Cox at [place], [place] Management  Direct Dial: [phone]

## 2021-06-22 ENCOUNTER — Other Ambulatory Visit: Payer: Self-pay

## 2021-06-22 ENCOUNTER — Encounter: Payer: Self-pay | Admitting: Family

## 2021-06-22 ENCOUNTER — Ambulatory Visit (INDEPENDENT_AMBULATORY_CARE_PROVIDER_SITE_OTHER): Payer: Medicare HMO | Admitting: Family

## 2021-06-22 ENCOUNTER — Other Ambulatory Visit (HOSPITAL_COMMUNITY): Payer: Self-pay

## 2021-06-22 DIAGNOSIS — F03918 Unspecified dementia, unspecified severity, with other behavioral disturbance: Secondary | ICD-10-CM

## 2021-06-22 DIAGNOSIS — B20 Human immunodeficiency virus [HIV] disease: Secondary | ICD-10-CM

## 2021-06-22 MED ORDER — BIKTARVY 50-200-25 MG PO TABS
1.0000 | ORAL_TABLET | Freq: Every day | ORAL | 5 refills | Status: DC
  Filled 2021-06-22 – 2021-06-29 (×2): qty 30, 30d supply, fill #0

## 2021-06-22 NOTE — Progress Notes (Signed)
Subjective:    Patient ID: Peter Cox, male    DOB: 02-11-1961, 61 y.o.   MRN: [id number]  Chief Complaint  Patient presents with   HIV Positive/AIDS     Virtual Visit via Telephone/Video Note   I connected with Peter Cox (daughter) on 06/22/2021 at 4:00pm by telephone and verified that I am speaking about the correct person using two identifiers as Peter Cox has advanced dementia.    I discussed the limitations, risks, security and privacy concerns of performing an evaluation and management service by telephone and the availability of in person appointments. I also discussed with the patient that there may be a patient responsible charge related to this service. The patient expressed understanding and agreed to proceed.  Location:  Patient: Home Provider: Clinic   HPI:  Peter Cox is a 61 y.o. male with HIV disease and advanced dementia last seen on 11/05/20 with good adherence and tolerance to his ART regimen of Biktarvy. Viral load at the time was 64 with CD4 count of 389. Telehealth visit today with daughter who provides all of the history for routine follow up  Peter Cox has been taking his Biktarvy daily as prescribed with no adverse side effects. Cognitively has continued to decline since his last office visit now responding with laughter if responding. Asking for assistance in providing his care and working to find a facility as his previous facility has closed down.   No problems obtaining medication and remains covered by [facility]. No current recreational or illicit drug use, tobacco use or alcohol consumption.    No Known Allergies    Outpatient Medications Prior to Visit  Medication Sig Dispense Refill   ACETAMINOPHEN EXTRA STRENGTH 500 MG tablet TAKE ONE TABLET BY MOUTH TWICE DAILY HARD COPY RX REQUIRED 60 tablet 9   amLODipine (NORVASC) 10 MG tablet Take 1 tablet (10 mg total) by mouth daily. 90 tablet 3   ASPIRIN LOW DOSE 81 MG EC tablet  TAKE ONE TABLET BY MOUTH DAILY * DO NOT CRUSH * HARD COPY RX REQUIRED 1 tablet 11   atorvastatin (LIPITOR) 20 MG tablet Take 1 tablet (20 mg total) by mouth daily. 90 tablet 1   divalproex (DEPAKOTE SPRINKLE) 125 MG capsule TAKE 2 CAPSULES BY MOUTH EVERY 12 HOURS 180 capsule 1   donepezil (ARICEPT) 10 MG tablet Take 1 tablet (10 mg total) by mouth at bedtime. Take 1/2 tablet daily for 2 weeks, then increase to 1 tablet daily 90 tablet 1   hydrALAZINE (APRESOLINE) 25 MG tablet Take 1 tablet by mouth 3 times daily. 270 tablet 3   hydrocortisone 1 % ointment Apply 1 application topically 2 (two) times daily as needed for itching (hemorrhoids). 30 g 5   labetalol (NORMODYNE) 100 MG tablet Take 1 tablet (100 mg total) by mouth 2 (two) times daily. 180 tablet 1   lisinopril (ZESTRIL) 40 MG tablet Take 1 tablet (40 mg total) by mouth daily. 90 tablet 1   LORazepam (ATIVAN) 1 MG tablet Take 1 tablet (1 mg total) by mouth every 4 (four) hours as needed for anxiety. 20 tablet 4   OLANZapine (ZYPREXA) 5 MG tablet Take 1 tablet (5 mg total) by mouth daily as needed (agitation). 90 tablet 3   scopolamine (TRANSDERM-SCOP) 1 MG/3DAYS Place 1 patch onto the skin behind the ear every 3 days. 10 patch 12   sennosides-docusate sodium (SENOKOT-S) 8.6-50 MG tablet Take 1 tablet by mouth daily. 30 tablet 3  STIMULANT LAXATIVE 8.6-50 MG tablet TAKE ONE TABLET BY MOUTH AT BEDTIME AS NEEDED FOR MODERATE CONSTIPATION HARD COPY RX REQUIRED 1 tablet 11   bictegravir-emtricitabine-tenofovir AF (BIKTARVY) 50-200-25 MG TABS tablet Take 1 tablet by mouth daily. 30 tablet 0   No facility-administered medications prior to visit.     Past Medical History:  Diagnosis Date   Alcohol abuse    heavy, clean since April   Alcohol abuse 11/24/2014   Cocaine abuse ([place])     clean since about 2000   Drug abuse ([place]) 11/24/2014   HIV infection ([place])    dx 08/08/2008 (unknown how he was exposed)  Initial VL 43,200 and CD4 410 on  08/28/08   Hyperlipidemia    Hypertension    Marijuana abuse    Mild cognitive impairment    likely vascular dementia/psa/?HIV componentCT Head 02/12/08 for htn crisis and concern for stroke: impression-1. Old right internal capsule and thalamic lacunar infarcts. 2. Mild to moderate chronic small vessell white matter ischemic changes in both cerebral hemisphere, greater on the right   Neuromuscular disorder (HCC)    Seizures ([place])    "last year had seizure"   Stroke [facility])    Tobacco abuse      Past Surgical History:  Procedure Laterality Date   CLOSED REDUCTION PATELLAR     right knee   HEMORRHOID SURGERY     SHOULDER ARTHROSCOPY  01/2008   w/extensive debridement (Dr Mardelle Matte)       Review of Systems  Unable to perform ROS: Dementia     Objective:    Nursing note and vital signs reviewed.  Limited secondary to telehealth visit.     Assessment & Plan:   Problem List Items Addressed This Visit       Nervous and Auditory   Dementia with behavioral disturbance    Peter Cox dementia has progressed with decreased functional abilities.  Family requesting assistance in finding placement for him. Will reach out to PCP to help determine any available resources. Sounds to be well cared for at home with caregiver role strain developing.         Other   Human immunodeficiency virus (HIV) disease ([place]) (Chronic)    Peter Cox appears to have well-controlled virus with good adherence and tolerance to his ART regimen of Biktarvy.  Suspect underlying dementia and worsening likely multifactorial including HIV and previous history of substance use and alcohol.  Continue current dose of Biktarvy.  Plan for follow-up in 6 months or sooner if needed with lab work on the same day.      Relevant Medications   bictegravir-emtricitabine-tenofovir AF (BIKTARVY) 50-200-25 MG TABS tablet     I am having Peter Cox. Wollen maintain his hydrocortisone, Aspirin Low Dose, Acetaminophen Extra  Strength, Stimulant Laxative, sennosides-docusate sodium, scopolamine, amLODipine, atorvastatin, divalproex, donepezil, hydrALAZINE, labetalol, OLANZapine, LORazepam, lisinopril, and Biktarvy.   Meds ordered this encounter  Medications   bictegravir-emtricitabine-tenofovir AF (BIKTARVY) 50-200-25 MG TABS tablet    Sig: Take 1 tablet by mouth daily.    Dispense:  30 tablet    Refill:  5    Order Specific Question:   Supervising Provider    Answer:   Carlyle Basques [phone]     I discussed the assessment and treatment plan with the patient. The patient was provided an opportunity to ask questions and all were answered. The patient agreed with the plan and demonstrated an understanding of the instructions.   The patient was advised to call back  or seek an in-person evaluation if the symptoms worsen or if the condition fails to improve as anticipated.   I provided  9  minutes of non-face-to-face time during this encounter.  Follow-up: Return in about 6 months (around 12/20/2021), or if symptoms worsen or fail to improve.   Terri Piedra, MSN, FNP-C Nurse Practitioner [facility] for Infectious Disease [place] number: [phone]

## 2021-06-22 NOTE — Assessment & Plan Note (Signed)
Mr. Thwaites appears to have well-controlled virus with good adherence and tolerance to his ART regimen of Biktarvy.  Suspect underlying dementia and worsening likely multifactorial including HIV and previous history of substance use and alcohol.  Continue current dose of Biktarvy.  Plan for follow-up in 6 months or sooner if needed with lab work on the same day.

## 2021-06-22 NOTE — Assessment & Plan Note (Signed)
Mr. Jeffries dementia has progressed with decreased functional abilities.  Family requesting assistance in finding placement for him. Will reach out to PCP to help determine any available resources. Sounds to be well cared for at home with caregiver role strain developing.

## 2021-06-22 NOTE — Patient Instructions (Signed)
Nice to speak with you.  Continue to take medication daily as prescribed.   Refills have been sent to the pharmacy.  Plan for follow up in 6 months or sooner if needed.   Have a great day!

## 2021-06-23 NOTE — Chronic Care Management (AMB) (Signed)
°  Chronic Care Management   Outreach Note  06/23/2021 Name: Peter Cox MRN: [id number] DOB: Jan 03, 1961  Peter Cox is a 61 y.o. year old male who is a primary care patient of Brita Romp, Dionne Bucy, MD. I reached out to Peter Cox by phone today in response to a referral sent by [name]'s primary care provider.  A second unsuccessful telephone outreach was attempted today. The patient was referred to the case management team for assistance with care management and care coordination.   Follow Up Plan: A HIPAA compliant phone message was left for the patient providing contact information and requesting a return call.  If patient returns call to provider office, please advise to call Embedded Care Management Care Guide Ilir Mahrt at [place], [place] Management  Direct Dial: [phone]

## 2021-06-24 ENCOUNTER — Telehealth: Payer: Self-pay

## 2021-06-24 NOTE — Telephone Encounter (Signed)
received referral for this patient . please review and determine process of referral.

## 2021-06-25 ENCOUNTER — Other Ambulatory Visit (HOSPITAL_COMMUNITY): Payer: Self-pay

## 2021-06-25 NOTE — Telephone Encounter (Signed)
Will recommend records from previous psychiatrist prior to making a decision. Thank you.

## 2021-06-29 ENCOUNTER — Other Ambulatory Visit (HOSPITAL_COMMUNITY): Payer: Self-pay

## 2021-06-30 ENCOUNTER — Other Ambulatory Visit (HOSPITAL_COMMUNITY): Payer: Self-pay

## 2021-06-30 NOTE — Chronic Care Management (AMB) (Signed)
Chronic Care Management   Note  06/30/2021 Name: Peter Cox MRN: [id number] DOB: Aug 27, 1960  Peter Cox is a 61 y.o. year old male who is a primary care patient of Brita Romp, Dionne Bucy, MD. I reached out to [name] by phone today in response to a referral sent by Peter Cox's PCP.  Mr. Hopwood was given information about Chronic Care Management services today including:  CCM service includes personalized support from designated clinical staff supervised by his physician, including individualized plan of care and coordination with other care providers 24/7 contact phone numbers for assistance for urgent and routine care needs. Service will only be billed when office clinical staff spend 20 minutes or more in a month to coordinate care. Only one practitioner may furnish and bill the service in a calendar month. The patient may stop CCM services at any time (effective at the end of the month) by phone call to the office staff. The patient is responsible for co-pay (up to 20% after annual deductible is met) if co-pay is required by the individual health plan.   Patient agreed to services and verbal consent obtained.   Follow up plan: Telephone appointment with care management team member scheduled for: 07/13/2021  Julian Hy, [place] Management  Direct Dial: [phone]

## 2021-07-01 ENCOUNTER — Other Ambulatory Visit (HOSPITAL_COMMUNITY): Payer: Self-pay

## 2021-07-05 ENCOUNTER — Other Ambulatory Visit (HOSPITAL_COMMUNITY): Payer: Self-pay

## 2021-07-12 ENCOUNTER — Ambulatory Visit: Payer: Medicare HMO | Admitting: Podiatry

## 2021-07-13 ENCOUNTER — Telehealth: Payer: Self-pay | Admitting: *Deleted

## 2021-07-13 ENCOUNTER — Telehealth: Payer: Medicare HMO | Admitting: *Deleted

## 2021-07-13 NOTE — Telephone Encounter (Signed)
°  Care Management   Follow Up Note   07/13/2021 Name: Peter Cox MRN: [id number] DOB: [birth year]   Referred by: [name], MD Reason for referral : Care Coordination   An unsuccessful telephone outreach was attempted today. The patient was referred to the case management team for assistance with care management and care coordination.   Phone call to [place] [phone] to discuss need for incontinent supplies. Process confirmed, once all parties are agreeable, they will contact patient's provider for order and medical necessity.  Follow Up Plan: Telephone follow up appointment with care management team member to be re-scheduled by care guide.   Elliot Gurney, [place] Worker  [facility] Care Management [phone]

## 2021-07-20 ENCOUNTER — Telehealth: Payer: Self-pay | Admitting: *Deleted

## 2021-07-20 NOTE — Chronic Care Management (AMB) (Signed)
°  Care Management   Note  07/20/2021 Name: Peter Cox MRN: [id number] DOB: 11-Sep-1960  Peter Cox is a 61 y.o. year old male who is a primary care patient of Brita Romp, Dionne Bucy, MD and is actively engaged with the care management team. I reached out to [name] by phone today to assist with re-scheduling an initial visit with the Licensed Clinical Social Worker  Follow up plan: Unsuccessful telephone outreach attempt made. A HIPAA compliant phone message was left for the patient providing contact information and requesting a return call.   Julian Hy, [place] Management  Direct Dial: [phone]

## 2021-07-23 ENCOUNTER — Other Ambulatory Visit (HOSPITAL_COMMUNITY): Payer: Self-pay

## 2021-07-26 ENCOUNTER — Other Ambulatory Visit (HOSPITAL_COMMUNITY): Payer: Self-pay

## 2021-08-05 NOTE — Chronic Care Management (AMB) (Signed)
°  Care Management   Note  08/05/2021 Name: Peter Cox MRN: [id number] DOB: [birth year]  Peter Cox is a 61 y.o. year old male who is a primary care patient of Brita Romp, Dionne Bucy, MD and is actively engaged with the care management team. I reached out to [name] by phone today to assist with re-scheduling an initial visit with the Licensed Clinical Social Worker  Follow up plan: 2nd Unsuccessful telephone outreach attempt made. A HIPAA compliant phone message was left for the patient providing contact information and requesting a return call.   Julian Hy, [place] Management  Direct Dial: [phone]

## 2021-08-09 NOTE — Chronic Care Management (AMB) (Signed)
°  Care Management   Note  08/09/2021 Name: Peter Cox MRN: [id number] DOB: October 08, 1960  Peter Cox is a 61 y.o. year old male who is a primary care patient of Brita Romp, Dionne Bucy, MD and is actively engaged with the care management team. I reached out to [name] by phone today to assist with re-scheduling an initial visit with the Licensed Clinical Social Worker  Follow up plan: We have been unable to make contact with the patient for follow up. The care management team is available to follow up with the patient after provider conversation with the patient regarding recommendation for care management engagement and subsequent re-referral to the care management team.   Julian Hy, [place] Management  Direct Dial: [phone]

## 2021-08-26 NOTE — Progress Notes (Deleted)
?  ? ? ?I,Sulibeya S Dimas,acting as a scribe for Lavon Paganini, MD.,have documented all relevant documentation on the behalf of Lavon Paganini, MD,as directed by  Lavon Paganini, MD while in the presence of Lavon Paganini, MD. ? ?Established patient visit ? ? ?Patient: Peter Cox   DOB: [birth year]   61 y.o. Male  MRN: [id number] ?Visit Date: 08/27/2021 ? ?Today's healthcare provider: Lavon Paganini, MD  ? ?No chief complaint on file. ? ?Subjective  ?  ?HPI  ?Hypertension, follow-up ? ?BP Readings from Last 3 Encounters:  ?05/27/21 (!) 149/88  ?11/12/20 (!) 166/90  ?11/05/20 136/82  ? Wt Readings from Last 3 Encounters:  ?05/27/21 133 lb 8 oz (60.6 kg)  ?11/12/20 135 lb (61.2 kg)  ?11/05/20 134 lb (60.8 kg)  ?  ? ?He was last seen for hypertension 3 months ago.  ?BP at that visit was as above. Management since that visit includes increased Lisinopril. ? ?He reports {excellent/good/fair/poor:19665} compliance with treatment. ?He {is/is not:9024} having side effects. {document side effects if present:1} ?He is following a {diet:21022986} diet. ?He {is/is not:9024} exercising. ?He {does/does not:200015} smoke. ? ?Use of agents associated with hypertension: {bp agents assoc with hypertension:511::"none"}.  ? ?Outside blood pressures are {***enter patient reported home BP readings, or 'not being checked':1}. ?Symptoms: ?{Yes/No:20286} chest pain {Yes/No:20286} chest pressure  ?{Yes/No:20286} palpitations {Yes/No:20286} syncope  ?{Yes/No:20286} dyspnea {Yes/No:20286} orthopnea  ?{Yes/No:20286} paroxysmal nocturnal dyspnea {Yes/No:20286} lower extremity edema  ? ?Pertinent labs: ?Lab Results  ?Component Value Date  ? CHOL 145 11/05/2020  ? HDL 42 11/05/2020  ? [place] 79 11/05/2020  ? TRIG 143 11/05/2020  ? CHOLHDL 3.5 11/05/2020  ? Lab Results  ?Component Value Date  ? NA 142 11/05/2020  ? K 4.2 11/05/2020  ? CREATININE 2.37 (H) 11/05/2020  ? GFRNONAA 28 (L) 11/05/2020  ? GLUCOSE 112 (H) 11/05/2020  ?  TSH 0.734 07/02/2020  ?  ? ?The 10-year ASCVD risk score (Arnett DK, et al., 2019) is: 29.3%  ? ?--------------------------------------------------------------------------------------------------- ?Lipid/Cholesterol, Follow-up ? ?Last lipid panel Other pertinent labs  ?Lab Results  ?Component Value Date  ? CHOL 145 11/05/2020  ? HDL 42 11/05/2020  ? [place] 79 11/05/2020  ? TRIG 143 11/05/2020  ? CHOLHDL 3.5 11/05/2020  ? Lab Results  ?Component Value Date  ? ALT 9 11/05/2020  ? AST 10 11/05/2020  ? PLT 157 09/28/2020  ? TSH 0.734 07/02/2020  ?  ? ?He was last seen for this 3 months ago.  ?Management since that visit includes none.  Last labs for this was in May 2022.  Last visit recommended checking on this visit. ? ?He reports {excellent/good/fair/poor:19665} compliance with treatment. ?He {is/is not:9024} having side effects. {document side effects if present:1} ? ?Symptoms: ?{Yes/No:20286} chest pain {Yes/No:20286} chest pressure/discomfort  ?{Yes/No:20286} dyspnea {Yes/No:20286} lower extremity edema  ?{Yes/No:20286} numbness or tingling of extremity {Yes/No:20286} orthopnea  ?{Yes/No:20286} palpitations {Yes/No:20286} paroxysmal nocturnal dyspnea  ?{Yes/No:20286} speech difficulty {Yes/No:20286} syncope  ? ?Current diet: {diet habits:16563} ?Current exercise: {exercise types:16438} ? ?The 10-year ASCVD risk score (Arnett DK, et al., 2019) is: 29.3% ? ?--------------------------------------------------------------------------------------------------- ? ? ?Medications: ?Outpatient Medications Prior to Visit  ?Medication Sig  ? ACETAMINOPHEN EXTRA STRENGTH 500 MG tablet TAKE ONE TABLET BY MOUTH TWICE DAILY HARD COPY RX REQUIRED  ? amLODipine (NORVASC) 10 MG tablet Take 1 tablet (10 mg total) by mouth daily.  ? ASPIRIN LOW DOSE 81 MG EC tablet TAKE ONE TABLET BY MOUTH DAILY * DO NOT CRUSH * HARD COPY RX REQUIRED  ?  atorvastatin (LIPITOR) 20 MG tablet Take 1 tablet (20 mg total) by mouth daily.  ?  bictegravir-emtricitabine-tenofovir AF (BIKTARVY) 50-200-25 MG TABS tablet Take 1 tablet by mouth daily.  ? divalproex (DEPAKOTE SPRINKLE) 125 MG capsule TAKE 2 CAPSULES BY MOUTH EVERY 12 HOURS  ? donepezil (ARICEPT) 10 MG tablet Take 1 tablet (10 mg total) by mouth at bedtime. Take 1/2 tablet daily for 2 weeks, then increase to 1 tablet daily  ? hydrALAZINE (APRESOLINE) 25 MG tablet Take 1 tablet by mouth 3 times daily.  ? hydrocortisone 1 % ointment Apply 1 application topically 2 (two) times daily as needed for itching (hemorrhoids).  ? labetalol (NORMODYNE) 100 MG tablet Take 1 tablet (100 mg total) by mouth 2 (two) times daily.  ? lisinopril (ZESTRIL) 40 MG tablet Take 1 tablet (40 mg total) by mouth daily.  ? LORazepam (ATIVAN) 1 MG tablet Take 1 tablet (1 mg total) by mouth every 4 (four) hours as needed for anxiety.  ? OLANZapine (ZYPREXA) 5 MG tablet Take 1 tablet (5 mg total) by mouth daily as needed (agitation).  ? scopolamine (TRANSDERM-SCOP) 1 MG/3DAYS Place 1 patch onto the skin behind the ear every 3 days.  ? sennosides-docusate sodium (SENOKOT-S) 8.6-50 MG tablet Take 1 tablet by mouth daily.  ? STIMULANT LAXATIVE 8.6-50 MG tablet TAKE ONE TABLET BY MOUTH AT BEDTIME AS NEEDED FOR MODERATE CONSTIPATION HARD COPY RX REQUIRED  ? ?No facility-administered medications prior to visit.  ? ? ?Review of Systems ? ?{Labs  Heme  Chem  Endocrine  Serology  Results Review (optional):23779} ?  Objective  ?  ?There were no vitals taken for this visit. ?{Show previous vital signs (optional):23777} ? ?Physical Exam  ?*** ? ?No results found for any visits on 08/27/21. ? Assessment & Plan  ?  ? ?*** ? ?No follow-ups on file.  ?   ? ?{provider attestation***:1} ? ? ?Lavon Paganini, MD  ?[facility] ?[phone] (phone) ?[phone] (fax) ? ?[facility] ?

## 2021-08-27 ENCOUNTER — Ambulatory Visit: Payer: Medicare HMO | Admitting: Family Medicine

## 2021-09-21 IMAGING — US US RENAL
1 series · 14 of 25 positions shown · non-contrast
Comparison: 11/28/2019, 05/04/2020

CLINICAL DATA: Right renal mass

EXAM:
RENAL / URINARY TRACT ULTRASOUND COMPLETE

[Series 1: us renal · 14 of 58 slices shown]
[im 1/58]
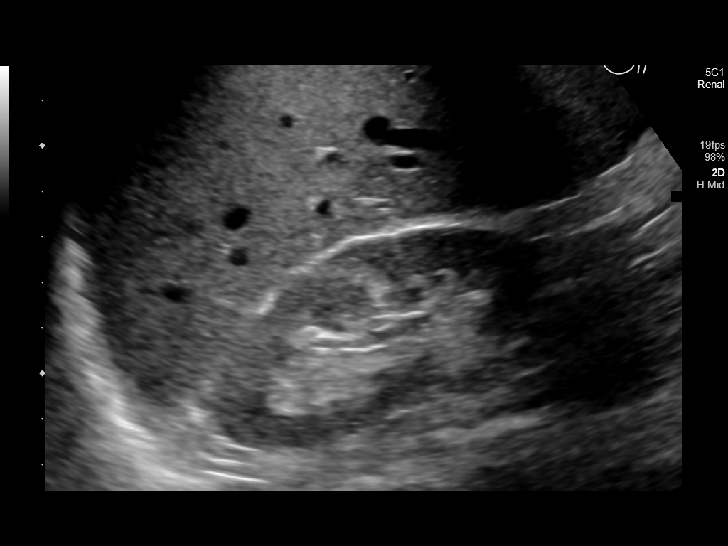
[im 5/58]
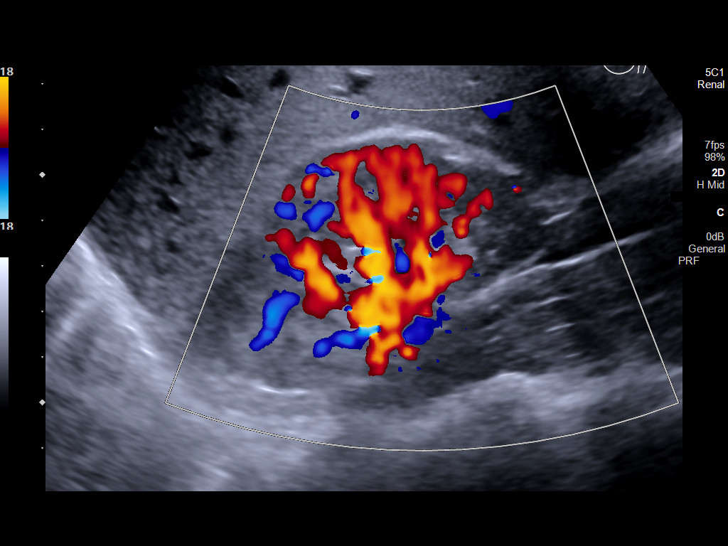
[im 10/58]
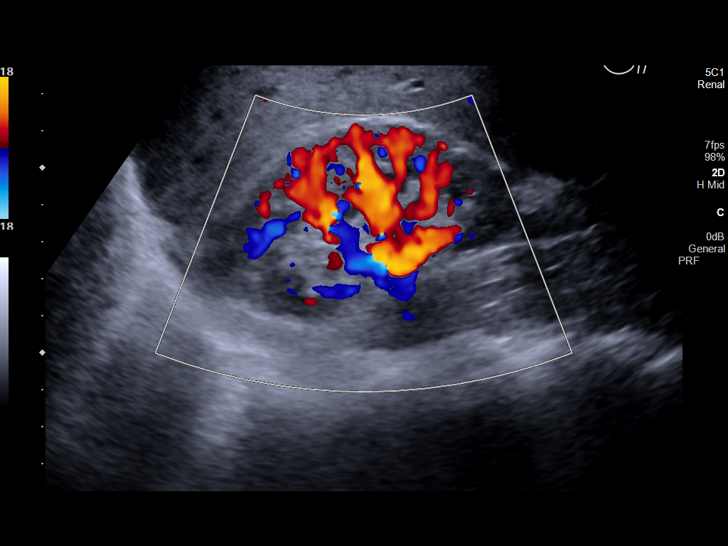
[im 15/58]
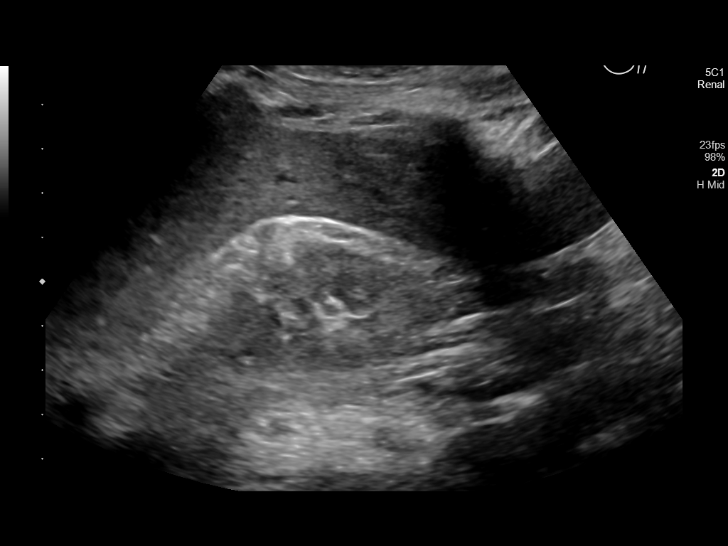
[im 20/58]
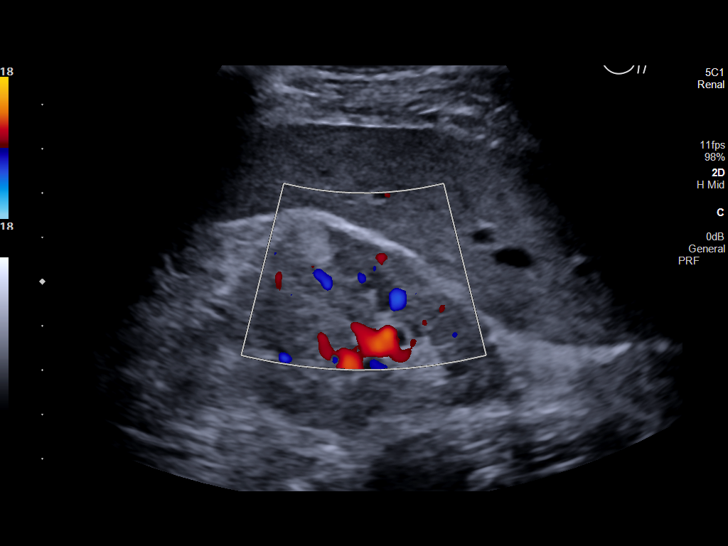
[im 22/58]
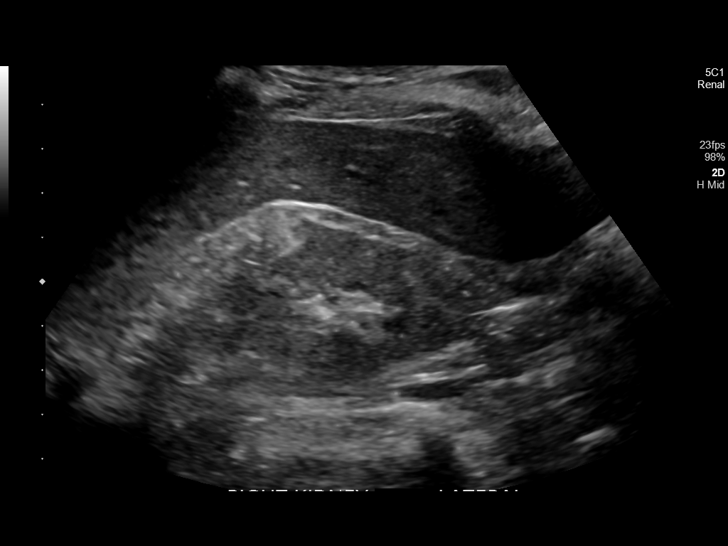
[im 27/58]
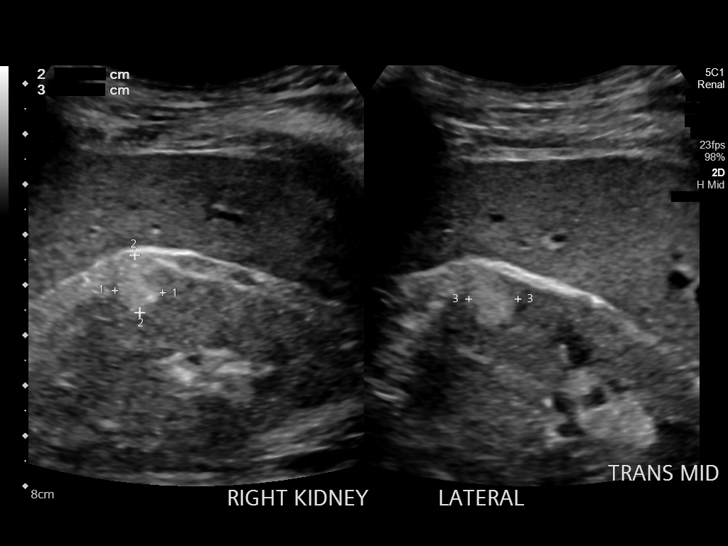
[im 31/58]
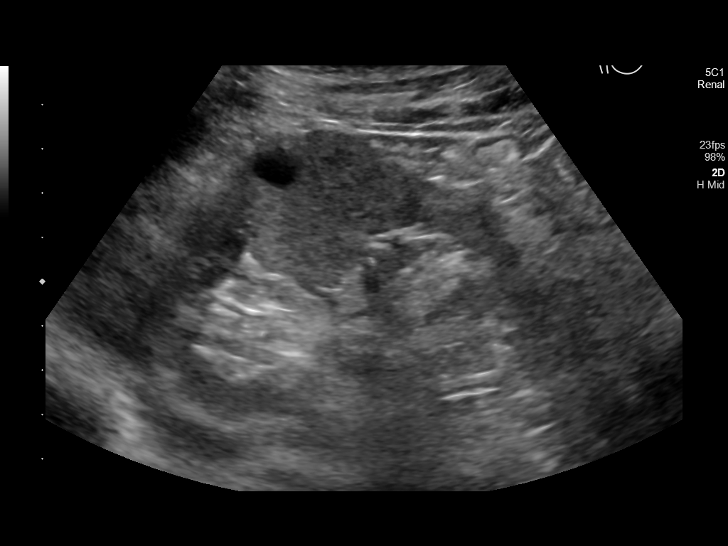
[im 36/58]
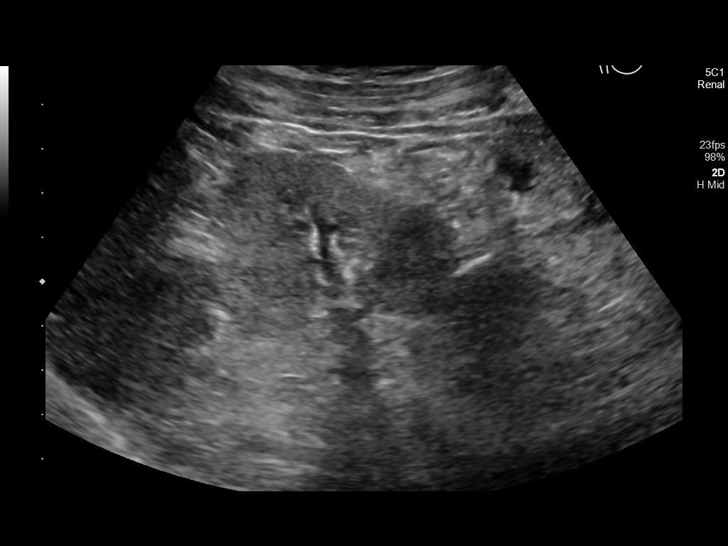
[im 39/58]
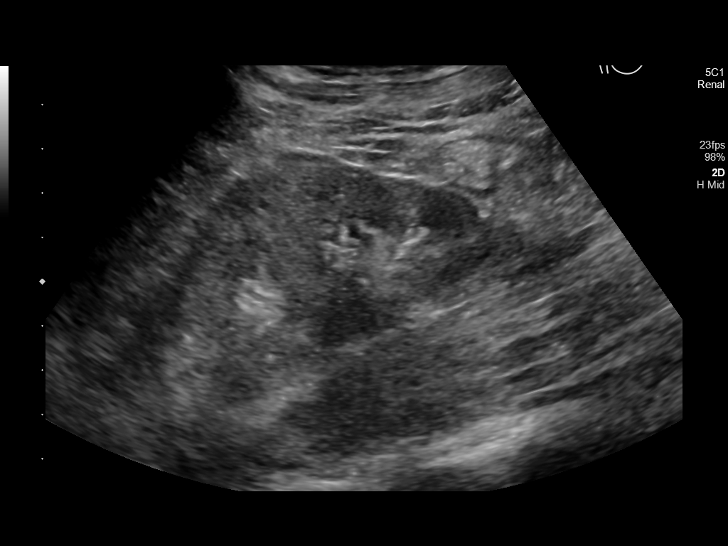
[im 43/58]
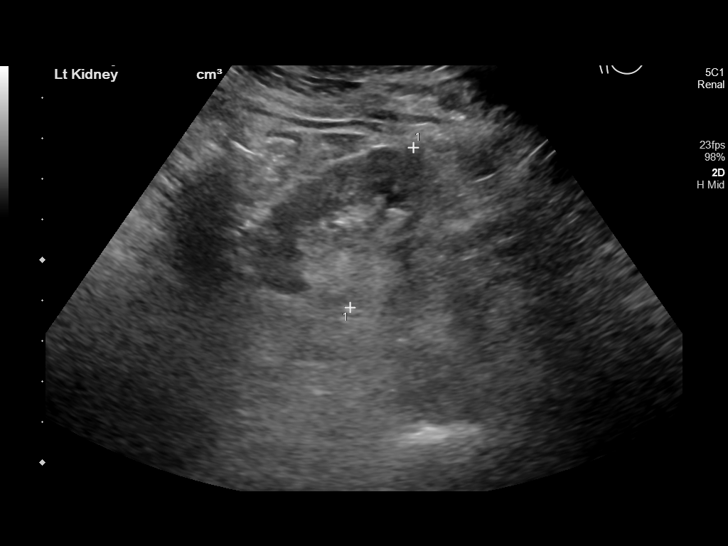
[im 48/58]
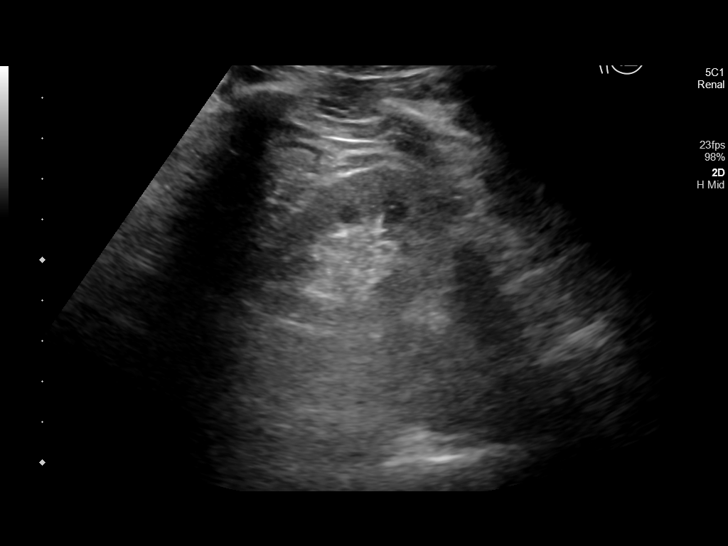
[im 53/58]
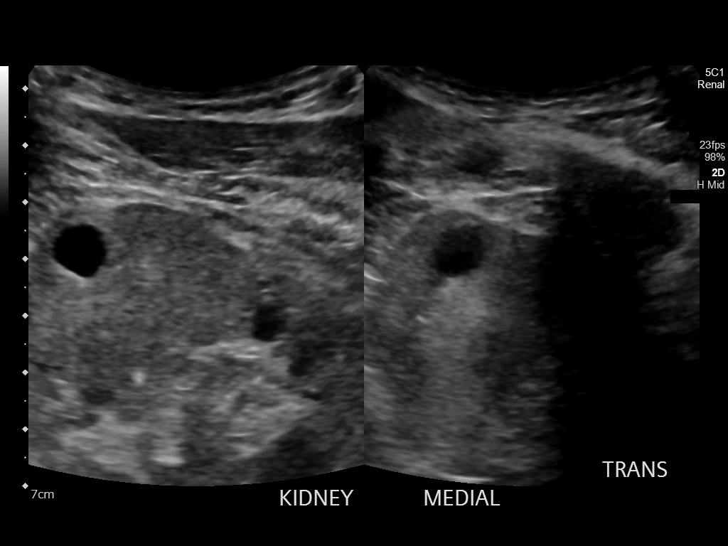
[im 58/58]
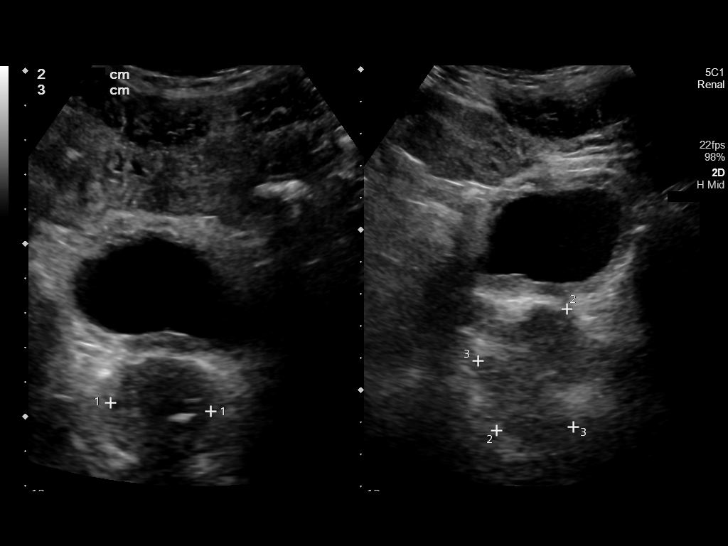

[14 of 25 positions shown; findings below may reference images not displayed]

FINDINGS: Right Kidney:

Renal measurements: 8.3 x 4.3 x 4.2 cm = volume: 78.6 mL. There is a
1.2 x 1.0 x 1.0 cm hyperechoic mass within the ventral interpolar
region of the right kidney, previously having measured 1.3 x 1.0 by
1.2 cm. This remains equivocal, and follow-up MRI is recommended if
not performed in the interim. No hydronephrosis or nephrolithiasis.

Left Kidney:

Renal measurements: 8.5 x 5.1 x 4.3 cm = volume: 96.8 mL.
Echogenicity within normal limits. There is a simple 1.0 x 1.0 x
cm cyst unchanged. No hydronephrosis or nephrolithiasis.

Bladder:

Appears normal for degree of bladder distention.

Other:

None.
IMPRESSION: 1. Stable equivocal 1.2 cm hypoechoic right renal mass. MRI is again
recommended if not performed in the interim since recent CT
05/04/2020.
[DATE]. Stable benign left renal cyst.

## 2021-10-19 ENCOUNTER — Other Ambulatory Visit: Payer: Self-pay | Admitting: Family Medicine

## 2021-10-19 DIAGNOSIS — N2889 Other specified disorders of kidney and ureter: Secondary | ICD-10-CM

## 2021-10-26 ENCOUNTER — Ambulatory Visit: Payer: Medicare HMO

## 2021-11-01 ENCOUNTER — Ambulatory Visit
Admission: RE | Admit: 2021-11-01 | Discharge: 2021-11-01 | Disposition: A | Discharge status: Home / Self Care | Payer: Medicare (Managed Care) | Source: Ambulatory Visit | Attending: Family Medicine | Admitting: Family Medicine

## 2021-11-01 DIAGNOSIS — N2889 Other specified disorders of kidney and ureter: Secondary | ICD-10-CM | POA: Diagnosis not present

## 2021-11-05 ENCOUNTER — Ambulatory Visit: Payer: Medicare (Managed Care) | Admitting: Urology

## 2021-11-11 ENCOUNTER — Ambulatory Visit: Payer: Medicare (Managed Care) | Admitting: Urology

## 2021-12-27 ENCOUNTER — Ambulatory Visit: Payer: Medicaid Other | Admitting: Family

## 2021-12-27 NOTE — Progress Notes (Deleted)
Brief Narrative   Patient ID: Peter Cox, male    DOB: 08/30/1960, 60 y.o.   MRN: [id number]    Subjective:    No chief complaint on file.   HPI:  Peter Cox is a 61 y.o. male with HIV disease and advanced dementia last seen for telehealth visit on 06/22/2021 with well-controlled virus and good adherence and tolerance to [place].  Previous lab work showed a viral load of 64 with CD4 count of 389. He was showing signs of continued cognitive decline with family asking for assistance in providing care as his previous facility closed down. Now enrolled in [facility]. Here today for routine follow up.      No Known Allergies    Outpatient Medications Prior to Visit  Medication Sig Dispense Refill   ACETAMINOPHEN EXTRA STRENGTH 500 MG tablet TAKE ONE TABLET BY MOUTH TWICE DAILY HARD COPY RX REQUIRED 60 tablet 9   amLODipine (NORVASC) 10 MG tablet Take 1 tablet (10 mg total) by mouth daily. 90 tablet 3   ASPIRIN LOW DOSE 81 MG EC tablet TAKE ONE TABLET BY MOUTH DAILY * DO NOT CRUSH * HARD COPY RX REQUIRED 1 tablet 11   atorvastatin (LIPITOR) 20 MG tablet Take 1 tablet (20 mg total) by mouth daily. 90 tablet 1   bictegravir-emtricitabine-tenofovir AF (BIKTARVY) 50-200-25 MG TABS tablet Take 1 tablet by mouth daily. 30 tablet 5   divalproex (DEPAKOTE SPRINKLE) 125 MG capsule TAKE 2 CAPSULES BY MOUTH EVERY 12 HOURS 180 capsule 1   donepezil (ARICEPT) 10 MG tablet Take 1 tablet (10 mg total) by mouth at bedtime. Take 1/2 tablet daily for 2 weeks, then increase to 1 tablet daily 90 tablet 1   hydrALAZINE (APRESOLINE) 25 MG tablet Take 1 tablet by mouth 3 times daily. 270 tablet 3   hydrocortisone 1 % ointment Apply 1 application topically 2 (two) times daily as needed for itching (hemorrhoids). 30 g 5   labetalol (NORMODYNE) 100 MG tablet Take 1 tablet (100 mg total) by mouth 2 (two) times daily. 180 tablet 1   lisinopril (ZESTRIL) 40 MG tablet Take 1 tablet (40  mg total) by mouth daily. 90 tablet 1   LORazepam (ATIVAN) 1 MG tablet Take 1 tablet (1 mg total) by mouth every 4 (four) hours as needed for anxiety. 20 tablet 4   OLANZapine (ZYPREXA) 5 MG tablet Take 1 tablet (5 mg total) by mouth daily as needed (agitation). 90 tablet 3   scopolamine (TRANSDERM-SCOP) 1 MG/3DAYS Place 1 patch onto the skin behind the ear every 3 days. 10 patch 12   sennosides-docusate sodium (SENOKOT-S) 8.6-50 MG tablet Take 1 tablet by mouth daily. 30 tablet 3   STIMULANT LAXATIVE 8.6-50 MG tablet TAKE ONE TABLET BY MOUTH AT BEDTIME AS NEEDED FOR MODERATE CONSTIPATION HARD COPY RX REQUIRED 1 tablet 11   No facility-administered medications prior to visit.     Past Medical History:  Diagnosis Date   Alcohol abuse    heavy, clean since April   Alcohol abuse 11/24/2014   Cocaine abuse ([place])     clean since about 2000   Drug abuse ([place]) 11/24/2014   HIV infection ([place])    dx 08/08/2008 (unknown how he was exposed)  Initial VL 43,200 and CD4 410 on 08/28/08   Hyperlipidemia    Hypertension    Marijuana abuse    Mild cognitive impairment    likely vascular dementia/psa/?HIV componentCT Head 02/12/08 for htn crisis and  concern for stroke: impression-1. Old right internal capsule and thalamic lacunar infarcts. 2. Mild to moderate chronic small vessell white matter ischemic changes in both cerebral hemisphere, greater on the right   Neuromuscular disorder (HCC)    Seizures ([place])    "last year had seizure"   Stroke [facility])    Tobacco abuse      Past Surgical History:  Procedure Laterality Date   CLOSED REDUCTION PATELLAR     right knee   HEMORRHOID SURGERY     SHOULDER ARTHROSCOPY  01/2008   w/extensive debridement (Dr Mardelle Matte)      Review of Systems    Objective:    There were no vitals taken for this visit. Nursing note and vital signs reviewed.  Physical Exam      05/27/2021    2:34 PM 11/12/2020    2:24 PM 11/05/2020    3:09 PM 09/07/2020   11:13 AM  04/23/2020    3:59 PM  Depression screen PHQ 2/9  Decreased Interest 0 0 0 0 0  Down, Depressed, Hopeless 0 0 0 0 0  PHQ - 2 Score 0 0 0 0 0  Altered sleeping  0  0   Tired, decreased energy  0  0   Change in appetite  0  0   Feeling bad or failure about yourself   0  0   Trouble concentrating  0  0   Moving slowly or fidgety/restless  0  0   Suicidal thoughts  0  0   PHQ-9 Score  0  0   Difficult doing work/chores  Not difficult at all  Not difficult at all        Assessment & Plan:    Patient Active Problem List   Diagnosis Date Noted   Sialorrhea 05/27/2021   Dysphagia 02/06/2020   Physical deconditioning 02/06/2020   Renal mass 01/01/2020   Secondary hyperparathyroidism of renal origin ([place]) 01/01/2020   Benign hypertensive kidney disease with chronic kidney disease 11/19/2019   Proteinuria 11/19/2019   History of substance abuse ([place]) 09/17/2018   Urinary incontinence 09/17/2018   Bilateral primary osteoarthritis of knee 09/13/2018   Decreased activities of daily living (ADL) 09/13/2018   Alcohol abuse 11/24/2014   Dementia with behavioral disturbance 07/02/2014   History of CVA (cerebrovascular accident) 07/02/2014   Stage 3b chronic kidney disease ([place]) 11/20/2008   Human immunodeficiency virus (HIV) disease ([place]) 08/17/2008   HLD (hyperlipidemia) 06/26/2008   Essential hypertension 06/24/2008     Problem List Items Addressed This Visit   None    I am having Peter Cox maintain his hydrocortisone, Aspirin Low Dose, Acetaminophen Extra Strength, Stimulant Laxative, sennosides-docusate sodium, scopolamine, amLODipine, atorvastatin, divalproex, donepezil, hydrALAZINE, labetalol, OLANZapine, LORazepam, lisinopril, and Biktarvy.   No orders of the defined types were placed in this encounter.    Follow-up: No follow-ups on file.   Terri Piedra, MSN, FNP-C Nurse Practitioner [facility] [place]  number: [phone]

## 2021-12-29 ENCOUNTER — Encounter: Payer: Self-pay | Admitting: Urology

## 2021-12-29 ENCOUNTER — Ambulatory Visit (INDEPENDENT_AMBULATORY_CARE_PROVIDER_SITE_OTHER): Payer: Medicare (Managed Care) | Admitting: Urology

## 2021-12-29 VITALS — BP 109/68 | HR 64 | Ht 68.0 in | Wt 138.0 lb

## 2021-12-29 DIAGNOSIS — N189 Chronic kidney disease, unspecified: Secondary | ICD-10-CM

## 2021-12-29 DIAGNOSIS — N2889 Other specified disorders of kidney and ureter: Secondary | ICD-10-CM | POA: Diagnosis not present

## 2021-12-29 NOTE — Progress Notes (Signed)
12/29/2021 1:22 PM   Peter Cox 1960-09-27 [id number]  Referring provider: [name], [place],  [place] [address]  Chief Complaint  Patient presents with   renal mass    Urologic history: 1.  Indeterminate right renal mass CT without contrast 12 mm right upper pole indeterminate lesion Severe CKD Elected surveillance  HPI: 61 y.o. male presents for renal mass follow-up.  Doing well since last visit No bothersome LUTS Denies dysuria, gross hematuria Denies flank, abdominal or pelvic pain RUS 11/01/2021 with hypoechoic lesion measuring 1.4 x 1.3 x 1.2 cm Renal ultrasound performed 11/02/2020 with a 1.2 x 1.0 x 1.0 hyperechoic right renal mass (previous 1.3 x 1.0 x 1.2 on 11/2019)   PMH: Past Medical History:  Diagnosis Date   Alcohol abuse    heavy, clean since April   Alcohol abuse 11/24/2014   Cocaine abuse ([place])     clean since about 2000   Drug abuse ([place]) 11/24/2014   HIV infection ([place])    dx 08/08/2008 (unknown how he was exposed)  Initial VL 43,200 and CD4 410 on 08/28/08   Hyperlipidemia    Hypertension    Marijuana abuse    Mild cognitive impairment    likely vascular dementia/psa/?HIV componentCT Head 02/12/08 for htn crisis and concern for stroke: impression-1. Old right internal capsule and thalamic lacunar infarcts. 2. Mild to moderate chronic small vessell white matter ischemic changes in both cerebral hemisphere, greater on the right   Neuromuscular disorder (HCC)    Seizures ([place])    "last year had seizure"   Stroke [facility])    Tobacco abuse     Surgical History: Past Surgical History:  Procedure Laterality Date   CLOSED REDUCTION PATELLAR     right knee   HEMORRHOID SURGERY     SHOULDER ARTHROSCOPY  01/2008   w/extensive debridement (Dr Mardelle Matte)    Home Medications:  Allergies as of 12/29/2021   No Known Allergies      Medication List        Accurate as of December 29, 2021  1:22 PM. If you have any  questions, ask your nurse or doctor.          Acetaminophen Extra Strength 500 MG tablet Generic drug: acetaminophen TAKE ONE TABLET BY MOUTH TWICE DAILY HARD COPY RX REQUIRED   amLODipine 10 MG tablet Commonly known as: NORVASC Take 1 tablet (10 mg total) by mouth daily.   Aspirin Low Dose 81 MG tablet Generic drug: aspirin EC TAKE ONE TABLET BY MOUTH DAILY * DO NOT CRUSH * HARD COPY RX REQUIRED   atorvastatin 20 MG tablet Commonly known as: LIPITOR Take 1 tablet (20 mg total) by mouth daily.   Biktarvy 50-200-25 MG Tabs tablet Generic drug: bictegravir-emtricitabine-tenofovir AF Take 1 tablet by mouth daily.   divalproex 125 MG capsule Commonly known as: DEPAKOTE SPRINKLE TAKE 2 CAPSULES BY MOUTH EVERY 12 HOURS   donepezil 10 MG tablet Commonly known as: ARICEPT Take 1 tablet (10 mg total) by mouth at bedtime. Take 1/2 tablet daily for 2 weeks, then increase to 1 tablet daily   hydrALAZINE 25 MG tablet Commonly known as: APRESOLINE Take 1 tablet by mouth 3 times daily.   hydrocortisone 1 % ointment Apply 1 application topically 2 (two) times daily as needed for itching (hemorrhoids).   labetalol 100 MG tablet Commonly known as: NORMODYNE Take 1 tablet (100 mg total) by mouth 2 (two) times daily.   lisinopril 40  MG tablet Commonly known as: ZESTRIL Take 1 tablet (40 mg total) by mouth daily.   LORazepam 1 MG tablet Commonly known as: ATIVAN Take 1 tablet (1 mg total) by mouth every 4 (four) hours as needed for anxiety.   OLANZapine 5 MG tablet Commonly known as: ZYPREXA Take 1 tablet (5 mg total) by mouth daily as needed (agitation).   scopolamine 1 MG/3DAYS Commonly known as: TRANSDERM-SCOP Place 1 patch onto the skin behind the ear every 3 days.   Stimulant Laxative 8.6-50 MG tablet Generic drug: senna-docusate TAKE ONE TABLET BY MOUTH AT BEDTIME AS NEEDED FOR MODERATE CONSTIPATION HARD COPY RX REQUIRED   sennosides-docusate sodium 8.6-50 MG  tablet Commonly known as: SENOKOT-S Take 1 tablet by mouth daily.   traZODone 50 MG tablet Commonly known as: DESYREL Take by mouth.   vitamin B-12 1000 MCG tablet Commonly known as: CYANOCOBALAMIN Take 1,000 mcg by mouth daily.        Allergies: No Known Allergies  Family History: Family History  Problem Relation Age of Onset   Diabetes Mother    Hypertension Mother    Stroke Mother    Diabetes Father    Hypertension Sister    Hypertension Sister    Colon cancer Neg Hx     Social History:  reports that he has been smoking cigarettes. He has a 20.00 pack-year smoking history. He has never used smokeless tobacco. He reports that he does not currently use alcohol. He reports that he does not currently use drugs after having used the following drugs: Marijuana. Frequency: 7.00 times per week.   Physical Exam: BP 109/68   Pulse 64   Ht '5\' 8"'$  (1.727 m)   Wt 138 lb (62.6 kg)   BMI 20.98 kg/m   Constitutional:  Alert, No acute distress. HEENT: [place] AT, moist mucus membranes.  Trachea midline, no masses. Cardiovascular: No clubbing, cyanosis, or edema. Respiratory: Normal respiratory effort, no increased work of breathing. GI: Abdomen is soft, nontender, nondistended, no abdominal masses GU: No CVA tenderness   Assessment & Plan:    1.  Right renal mass Indeterminate right renal mass which is stable; not candidate for contrast secondary to severe CKD Follow-up RUS 1 year    Abbie Sons, MD  [place] [address] [place], [place] [address] [phone]

## 2022-02-10 ENCOUNTER — Telehealth: Payer: Self-pay

## 2022-02-10 NOTE — Telephone Encounter (Signed)
[place] program provider, called to inform us that patient will be moving to a nursing home ([facility] in [place]) tomorrow. Dr.Mouw stated that patient has been without Biktarvy for at least 5 to 6 weeks. Dr.Mouw is sending over most recent lab work. Patient is going to start back taking Biktarvy today. Patient scheduled for phone visit with Terri Piedra, NP on 8/28 at 1:45. Dr.Mouw will assist patient with phone visit.   Call back number [place], [place]

## 2022-02-14 ENCOUNTER — Other Ambulatory Visit: Payer: Self-pay

## 2022-02-14 ENCOUNTER — Encounter: Payer: Self-pay | Admitting: Family

## 2022-02-14 ENCOUNTER — Ambulatory Visit (INDEPENDENT_AMBULATORY_CARE_PROVIDER_SITE_OTHER): Payer: Medicare (Managed Care) | Admitting: Family

## 2022-02-14 DIAGNOSIS — B20 Human immunodeficiency virus [HIV] disease: Secondary | ICD-10-CM

## 2022-02-14 DIAGNOSIS — N184 Chronic kidney disease, stage 4 (severe): Secondary | ICD-10-CM | POA: Diagnosis not present

## 2022-02-14 DIAGNOSIS — F03918 Unspecified dementia, unspecified severity, with other behavioral disturbance: Secondary | ICD-10-CM | POA: Diagnosis not present

## 2022-02-14 MED ORDER — JULUCA 50-25 MG PO TABS: 1.0000 | ORAL_TABLET | Freq: Every day | ORAL | 5 refills | Status: DC

## 2022-02-14 NOTE — Assessment & Plan Note (Signed)
Peter Cox appears to be stable and now admitted to skilled nursing to assist in care.

## 2022-02-14 NOTE — Progress Notes (Signed)
Subjective:    Patient ID: Peter Cox, male    DOB: May 02, 1961, 61 y.o.   MRN: [id number]  Chief Complaint  Patient presents with   Follow-up     Virtual Visit via Telephone/Video Note   I connected with Peter Cox and Dr. Conley Rolls on 02/14/2022 at 2:33 PM by telephone and verified that I am speaking with the correct person using two identifiers.   I discussed the limitations, risks, security and privacy concerns of performing an evaluation and management service by telephone and the availability of in person appointments. I also discussed with the patient that there may be a patient responsible charge related to this service. The patient expressed understanding and agreed to proceed.  Location:  Patient: Peter Cox Skilled Nursing Provider: [facility]   HPI:  Peter Cox is a 61 y.o. male with HIV disease last seen through telehealth on 06/22/21 with apparently well controlled virus and good adherence and tolerance to [place]. No recent lab work completed. He had progressively worsening dementia and now enrolled in the PACE program at [facility] shortly being transferred to [facility]. Lab work collected on 01/24/22 with CD4 count of 486 and creatinine 3.16 with eGFR 21 and CrCl 21. Dr. Wilford Grist is his PCP/Geriatrician. Telehealth visit today for follow up.   Peter Cox has been doing well with the PACE program and moved into a skilled facility over the past few days and has done well. There is some question if he has missed about 1 month of medication. Has been tolerating the medication he is tolerating with no adverse side effects. Peter Cox interaction is limited secondary to advanced dementia.    No Known Allergies    Outpatient Medications Prior to Visit  Medication Sig Dispense Refill   ACETAMINOPHEN EXTRA STRENGTH 500 MG tablet TAKE ONE TABLET BY MOUTH TWICE DAILY HARD COPY RX REQUIRED 60 tablet 9   amLODipine (NORVASC) 10 MG tablet Take  1 tablet (10 mg total) by mouth daily. 90 tablet 3   bictegravir-emtricitabine-tenofovir AF (BIKTARVY) 50-200-25 MG TABS tablet Take 1 tablet by mouth daily. 30 tablet 5   hydrALAZINE (APRESOLINE) 25 MG tablet Take 1 tablet by mouth 3 times daily. 270 tablet 3   vitamin B-12 (CYANOCOBALAMIN) 1000 MCG tablet Take 1,000 mcg by mouth daily.     ASPIRIN LOW DOSE 81 MG EC tablet TAKE ONE TABLET BY MOUTH DAILY * DO NOT CRUSH * HARD COPY RX REQUIRED (Patient not taking: Reported on 02/14/2022) 1 tablet 11   atorvastatin (LIPITOR) 20 MG tablet Take 1 tablet (20 mg total) by mouth daily. (Patient not taking: Reported on 02/14/2022) 90 tablet 1   divalproex (DEPAKOTE SPRINKLE) 125 MG capsule TAKE 2 CAPSULES BY MOUTH EVERY 12 HOURS 180 capsule 1   donepezil (ARICEPT) 10 MG tablet Take 1 tablet (10 mg total) by mouth at bedtime. Take 1/2 tablet daily for 2 weeks, then increase to 1 tablet daily 90 tablet 1   hydrocortisone 1 % ointment Apply 1 application topically 2 (two) times daily as needed for itching (hemorrhoids). (Patient not taking: Reported on 02/14/2022) 30 g 5   labetalol (NORMODYNE) 100 MG tablet Take 1 tablet (100 mg total) by mouth 2 (two) times daily. 180 tablet 1   lisinopril (ZESTRIL) 40 MG tablet Take 1 tablet (40 mg total) by mouth daily. (Patient not taking: Reported on 02/14/2022) 90 tablet 1   LORazepam (ATIVAN) 1 MG tablet Take 1 tablet (1  mg total) by mouth every 4 (four) hours as needed for anxiety. 20 tablet 4   OLANZapine (ZYPREXA) 5 MG tablet Take 1 tablet (5 mg total) by mouth daily as needed (agitation). 90 tablet 3   scopolamine (TRANSDERM-SCOP) 1 MG/3DAYS Place 1 patch onto the skin behind the ear every 3 days. 10 patch 12   sennosides-docusate sodium (SENOKOT-S) 8.6-50 MG tablet Take 1 tablet by mouth daily. 30 tablet 3   STIMULANT LAXATIVE 8.6-50 MG tablet TAKE ONE TABLET BY MOUTH AT BEDTIME AS NEEDED FOR MODERATE CONSTIPATION HARD COPY RX REQUIRED (Patient not taking: Reported on  02/14/2022) 1 tablet 11   traZODone (DESYREL) 50 MG tablet Take by mouth. (Patient not taking: Reported on 02/14/2022)     VITAMIN D-1000 MAX ST 25 MCG (1000 UT) tablet Take 1,000 Units by mouth daily.     No facility-administered medications prior to visit.     Past Medical History:  Diagnosis Date   Alcohol abuse    heavy, clean since April   Alcohol abuse 11/24/2014   Cocaine abuse ([place])     clean since about 2000   Drug abuse ([place]) 11/24/2014   HIV infection ([place])    dx 08/08/2008 (unknown how he was exposed)  Initial VL 43,200 and CD4 410 on 08/28/08   Hyperlipidemia    Hypertension    Marijuana abuse    Mild cognitive impairment    likely vascular dementia/psa/?HIV componentCT Head 02/12/08 for htn crisis and concern for stroke: impression-1. Old right internal capsule and thalamic lacunar infarcts. 2. Mild to moderate chronic small vessell white matter ischemic changes in both cerebral hemisphere, greater on the right   Neuromuscular disorder (HCC)    Seizures ([place])    "last year had seizure"   Stroke [facility])    Tobacco abuse      Past Surgical History:  Procedure Laterality Date   CLOSED REDUCTION PATELLAR     right knee   HEMORRHOID SURGERY     SHOULDER ARTHROSCOPY  01/2008   w/extensive debridement (Dr Mardelle Matte)       Review of Systems  Unable to perform ROS: Dementia      Objective:    Nursing note and vital signs reviewed.    Peter Cox can be heard laughing at times in the background but does not contribute to the conversation. Physical exam limited secondary to telephone visit.    Assessment & Plan:   Problem List Items Addressed This Visit       Nervous and Auditory   Dementia with behavioral disturbance    Peter Cox appears to be stable and now admitted to skilled nursing to assist in care.         Genitourinary   CKD (chronic kidney disease) stage 4, GFR 15-29 ml/min [facility])    Peter Cox has worsening renal function with creatinine clearance of  20 and eGFR of 21. HIV medication changed to renal friendly [place]. Primary team working to control comorbid conditions to help reduce risk of progression of disease.         Other   Human immunodeficiency virus (HIV) disease ([place]) - Primary (Chronic)    Peter Cox generally has well controlled virus and perhaps has missed one month of medication. Now living in a skilled facility and taking medication should not be an issue. His renal function is declined and CrCl is calculated at 20 and will need to change his Biktarvy to a more renal friendly option. He is reportedly eating well  and will change him to [place]. In the event that he is not eating well he can be changed to Doravirine and Dolutegravir. Requested viral load follow up in 1 month and formal follow up in 6 months or sooner as needed.         I am having Skylan Lara. [place]rell maintain his hydrocortisone, Aspirin Low Dose, Acetaminophen Extra Strength, Stimulant Laxative, sennosides-docusate sodium, scopolamine, amLODipine, atorvastatin, divalproex, donepezil, hydrALAZINE, labetalol, OLANZapine, LORazepam, lisinopril, Biktarvy, traZODone, cyanocobalamin, and Vitamin D-1000 Max St.   No orders of the defined types were placed in this encounter.    I discussed the assessment and treatment plan with the patient. The patient was provided an opportunity to ask questions and all were answered. The patient agreed with the plan and demonstrated an understanding of the instructions.   The patient was advised to call back or seek an in-person evaluation if the symptoms worsen or if the condition fails to improve as anticipated.   I provided 12  minutes of non-face-to-face time during this encounter.  Follow-up: 6 months or sooner if needed with lab work done by PCP   Terri Piedra, MSN, FNP-C Nurse Practitioner [facility] [place] number: [phone]

## 2022-02-14 NOTE — Assessment & Plan Note (Signed)
Peter Cox generally has well controlled virus and perhaps has missed one month of medication. Now living in a skilled facility and taking medication should not be an issue. His renal function is declined and CrCl is calculated at 20 and will need to change his Biktarvy to a more renal friendly option. He is reportedly eating well and will change him to [place]. In the event that he is not eating well he can be changed to Doravirine and Dolutegravir. Requested viral load follow up in 1 month and formal follow up in 6 months or sooner as needed.

## 2022-02-14 NOTE — Assessment & Plan Note (Signed)
Peter Cox has worsening renal function with creatinine clearance of 20 and eGFR of 21. HIV medication changed to renal friendly [place]. Primary team working to control comorbid conditions to help reduce risk of progression of disease.

## 2022-06-24 ENCOUNTER — Other Ambulatory Visit (HOSPITAL_COMMUNITY): Payer: Self-pay

## 2022-09-20 IMAGING — US US RENAL
1 series · 15 of 25 positions shown · non-contrast
Comparison: Ultrasound 11/02/2020.  CT 05/04/2020.

CLINICAL DATA: Follow-up of renal mass.

EXAM:
RENAL / URINARY TRACT ULTRASOUND COMPLETE

[Series 1: us renal · 15 of 39 slices shown]
[im 1/39]
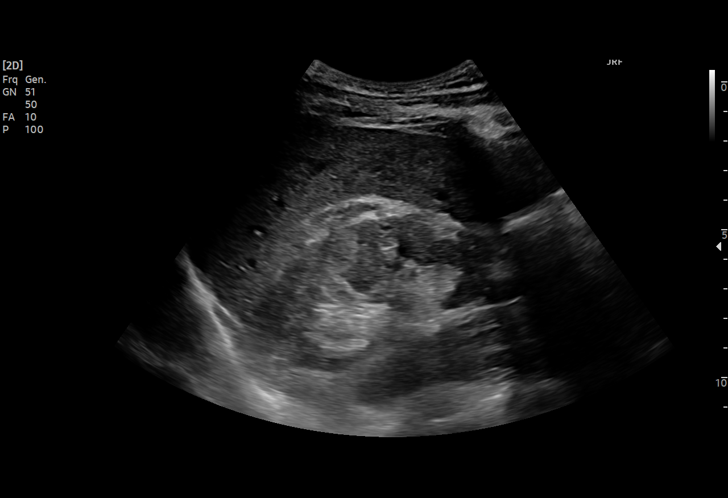
[im 4/39]
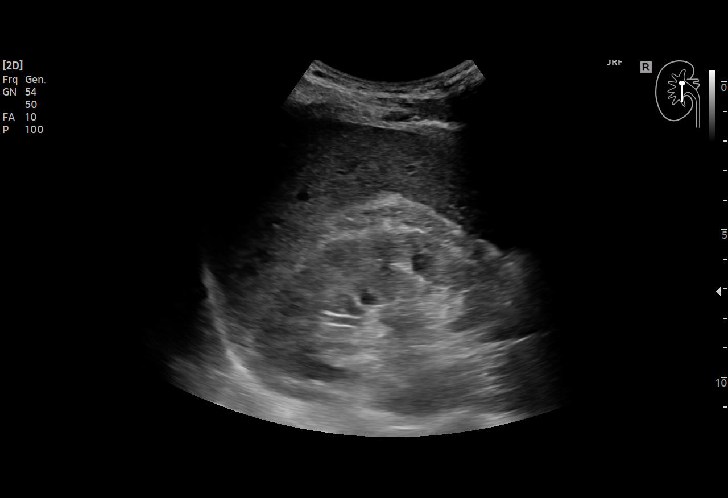
[im 7/39]
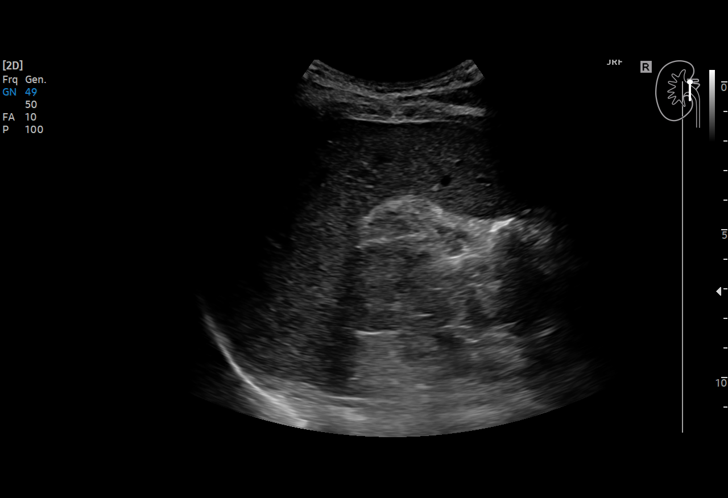
[im 8/39]
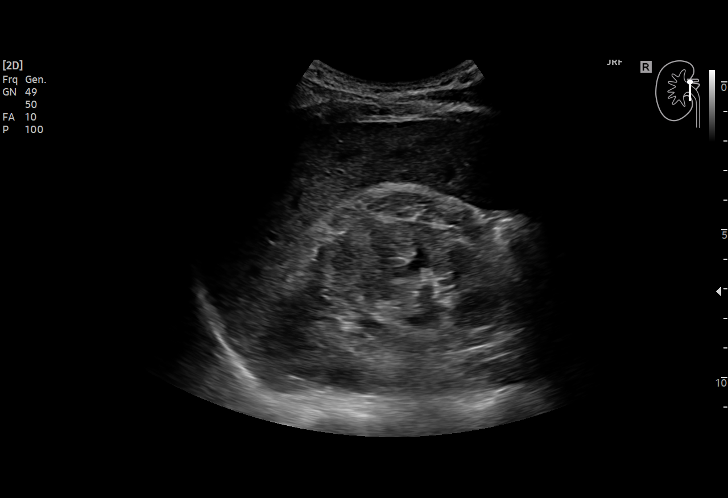
[im 12/39]
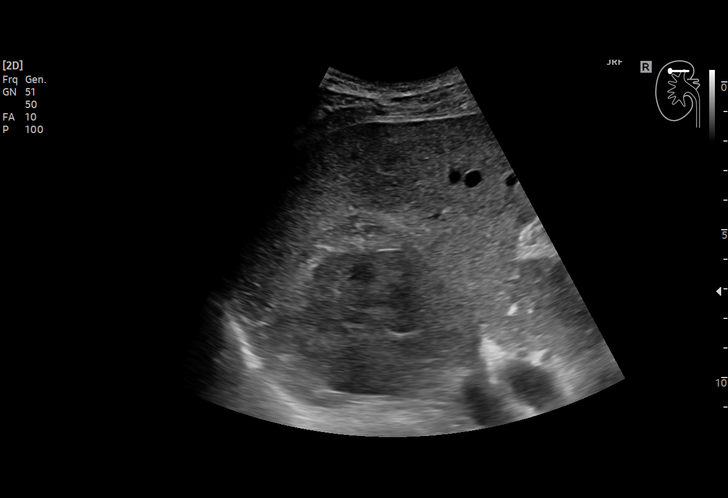
[im 15/39]
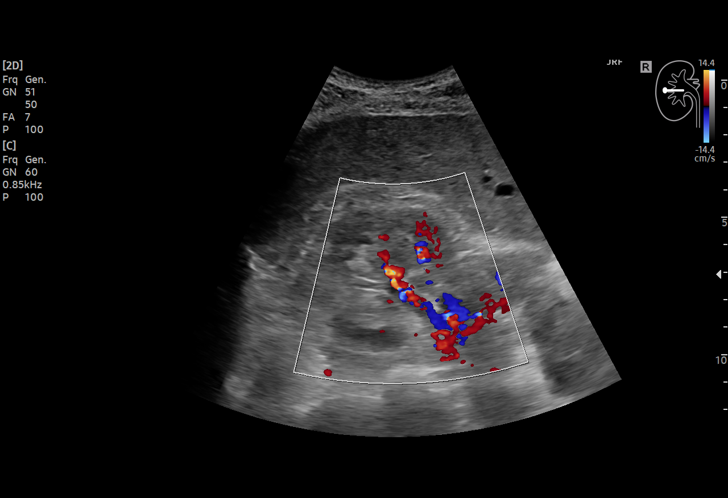
[im 16/39]
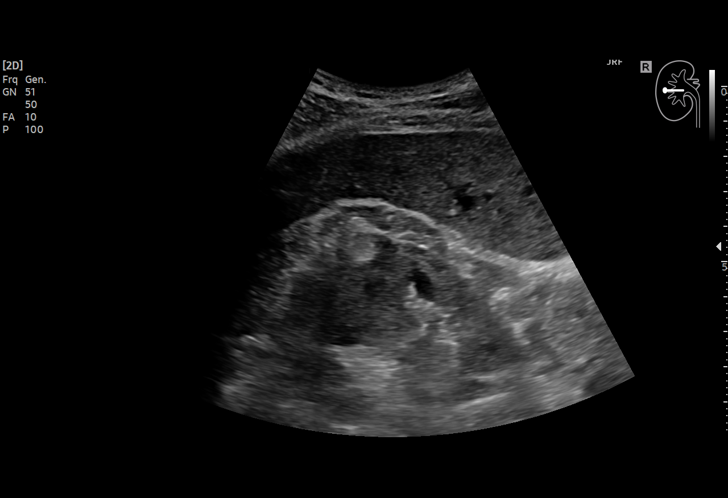
[im 20/39]
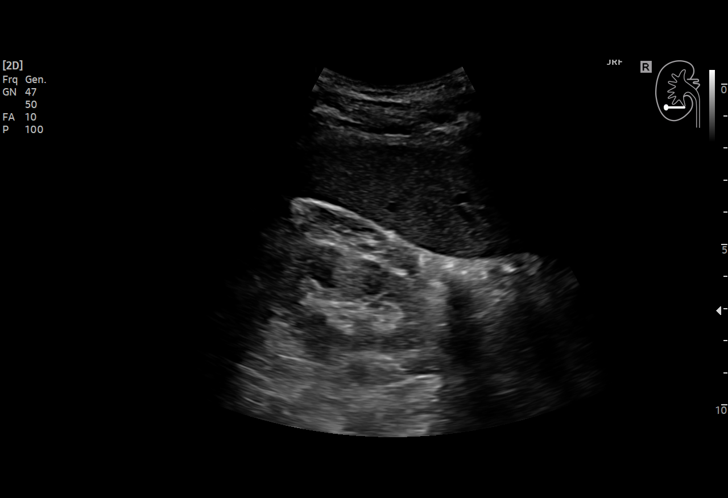
[im 23/39]
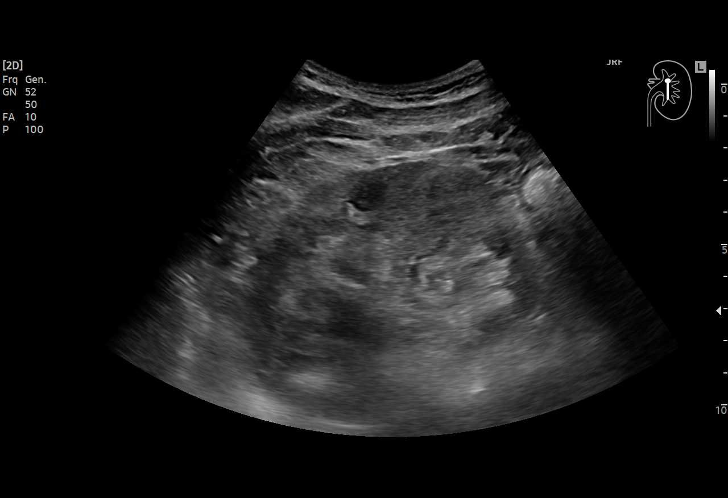
[im 24/39]
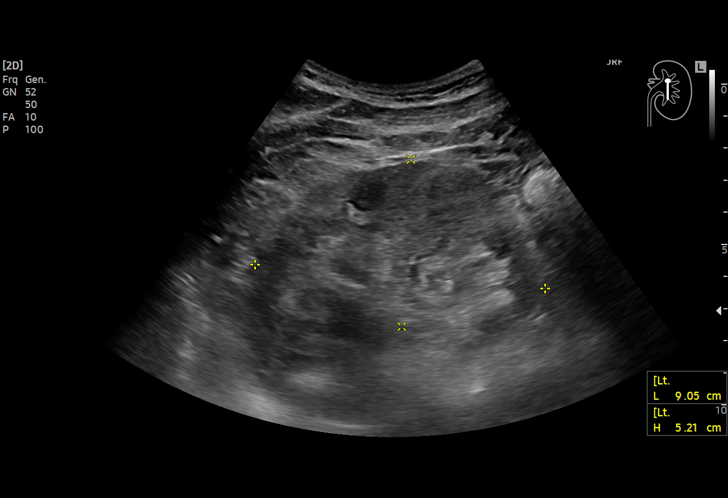
[im 27/39]
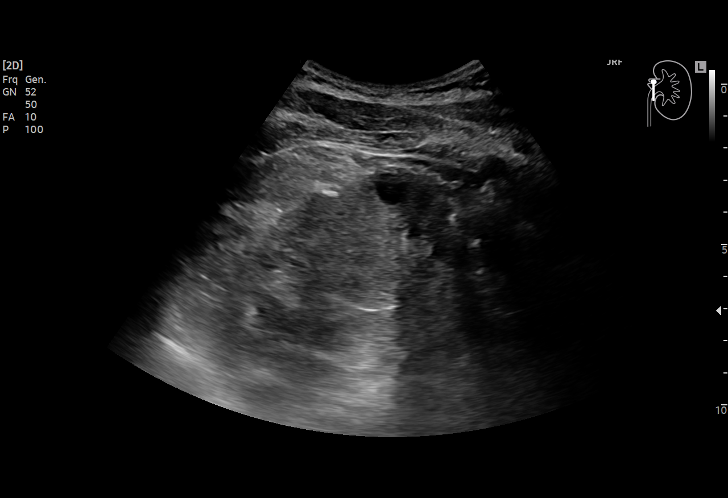
[im 31/39]
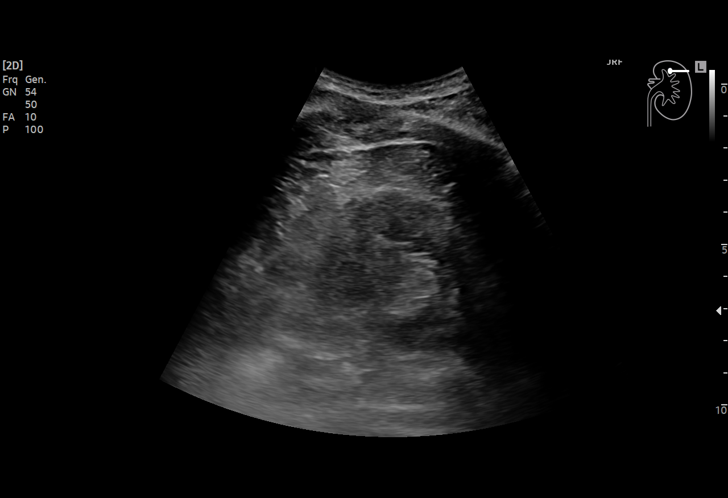
[im 32/39]
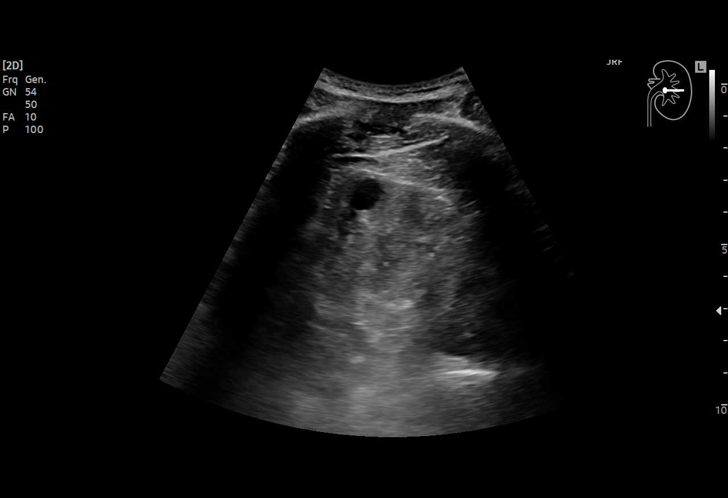
[im 35/39]
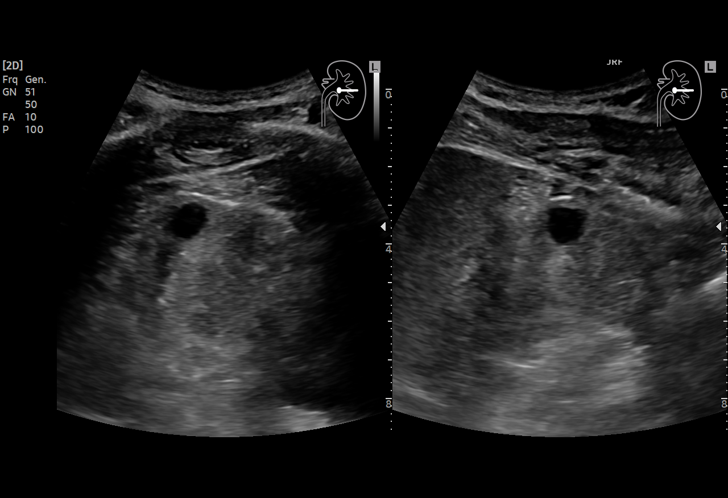
[im 39/39]
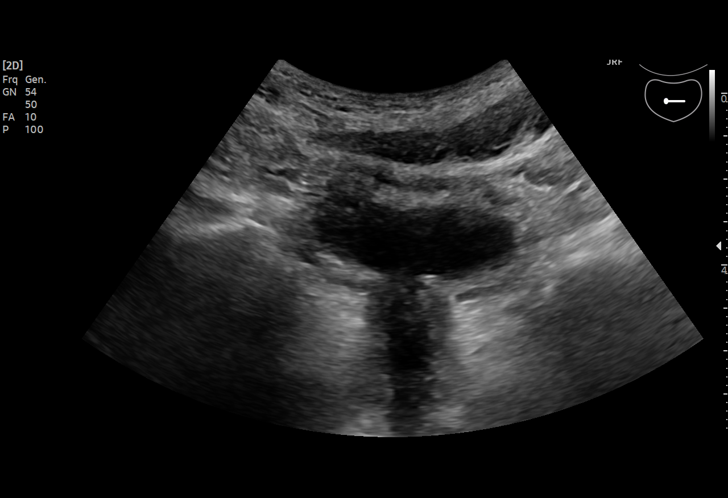

[15 of 25 positions shown; findings below may reference images not displayed]

FINDINGS: Right Kidney:

Renal measurements: 8.9 x 4.6 x 5.6 cm = volume: 119 mL. Interpolar
right renal hyperechoic lesion measures 1.4 x 1.3 x 1.2 cm versus
1.2 x 1.0 x 1.0 cm on the prior exam. [DATE] x 1.0 x 1.2 cm on
11/28/2019. No hydronephrosis.

Left Kidney:

Renal measurements: 9.1 x 5.2 x 4.4 cm = volume: 109 mL. Interpolar
left renal 1.1 cm cyst. No hydronephrosis.

Bladder:

Primarily decompressed.

Other:

None.
IMPRESSION: Hyperechoic interpolar right renal lesion is similar to minimally
enlarged compared to 11/28/2019 ultrasound. This remains
indeterminate but relative ongoing size stability suggests an
indolent lesion. If definitive characterization is desired, pre and
post-contrast CT or MRI is the test of choice.

## 2022-10-31 ENCOUNTER — Other Ambulatory Visit: Payer: Self-pay

## 2022-10-31 ENCOUNTER — Encounter: Payer: Self-pay | Admitting: Family

## 2022-10-31 ENCOUNTER — Ambulatory Visit (INDEPENDENT_AMBULATORY_CARE_PROVIDER_SITE_OTHER): Payer: Medicare (Managed Care) | Admitting: Family

## 2022-10-31 VITALS — BP 146/88 | HR 73 | Temp 98.1°F | Ht 65.0 in | Wt 151.0 lb

## 2022-10-31 DIAGNOSIS — B20 Human immunodeficiency virus [HIV] disease: Secondary | ICD-10-CM

## 2022-10-31 DIAGNOSIS — N184 Chronic kidney disease, stage 4 (severe): Secondary | ICD-10-CM

## 2022-10-31 DIAGNOSIS — F03918 Unspecified dementia, unspecified severity, with other behavioral disturbance: Secondary | ICD-10-CM

## 2022-10-31 NOTE — Assessment & Plan Note (Signed)
Peter Cox renal function continues to be tenuous and so far appears to be tolerating [place]. Check renal function and adjust medications accordingly.

## 2022-10-31 NOTE — Patient Instructions (Signed)
Nice to see you. ? ?We will check your lab work today. ? ?Continue to take your medication daily as prescribed. ? ?Refills have been sent to the pharmacy. ? ?Plan for follow up in 6 months or sooner if needed with lab work on the same day. ? ?Have a great day and stay safe! ? ?

## 2022-10-31 NOTE — Progress Notes (Signed)
Patient ID: Peter Cox, male    DOB: May 13, 1961, 62 y.o.   MRN: [id number]  Subjective:    Chief Complaint  Patient presents with   Follow-up    B20    HPI:  Peter Cox is a 62 y.o. male with HIV disease last seen on 02/14/22 with well controlled virus and good adherence and tolerance to Biktarvy. Viral load was undetectable and CD4 count 338. Medication was changed from Biktarvy to [place] secondary to reduced renal function with creatinine clearance of 20. Most recent lab work with CD4 count 491 and no recent viral load. He had been enrolled in PACE program at the [facility] and was being transferred to [facility]. Here today for follow up with a nursing aide from the PACE program.   Mr. Latney has been doing okay since his last office visit. Has been taking his medication as prescribed with no adverse side effects. No new concerns/complaints. He is not a good historian given his advanced dementia.   Denies fevers, chills, night sweats, headaches, changes in vision, neck pain/stiffness, nausea, diarrhea, vomiting, lesions or rashes.    No Known Allergies    Outpatient Medications Prior to Visit  Medication Sig Dispense Refill   ACETAMINOPHEN EXTRA STRENGTH 500 MG tablet TAKE ONE TABLET BY MOUTH TWICE DAILY HARD COPY RX REQUIRED 60 tablet 9   amLODipine (NORVASC) 10 MG tablet Take 1 tablet (10 mg total) by mouth daily. 90 tablet 3   ASPIRIN LOW DOSE 81 MG EC tablet TAKE ONE TABLET BY MOUTH DAILY * DO NOT CRUSH * HARD COPY RX REQUIRED 1 tablet 11   atorvastatin (LIPITOR) 20 MG tablet Take 1 tablet (20 mg total) by mouth daily. 90 tablet 1   cloNIDine (CATAPRES - DOSED IN MG/24 HR) 0.1 mg/24hr patch Place 0.1 mg onto the skin once a week.     dolutegravir-rilpivirine (JULUCA) 50-25 MG tablet Take 1 tablet by mouth daily before lunch. 30 tablet 5   donepezil (ARICEPT) 10 MG tablet Take 1 tablet (10 mg total) by mouth at bedtime. Take 1/2 tablet daily  for 2 weeks, then increase to 1 tablet daily 90 tablet 1   hydrALAZINE (APRESOLINE) 25 MG tablet Take 1 tablet by mouth 3 times daily. 270 tablet 3   hydrocortisone 1 % ointment Apply 1 application topically 2 (two) times daily as needed for itching (hemorrhoids). 30 g 5   labetalol (NORMODYNE) 100 MG tablet Take 1 tablet (100 mg total) by mouth 2 (two) times daily. 180 tablet 1   lisinopril (ZESTRIL) 40 MG tablet Take 1 tablet (40 mg total) by mouth daily. 90 tablet 1   LORazepam (ATIVAN) 1 MG tablet Take 1 tablet (1 mg total) by mouth every 4 (four) hours as needed for anxiety. 20 tablet 4   OLANZapine (ZYPREXA) 5 MG tablet Take 1 tablet (5 mg total) by mouth daily as needed (agitation). 90 tablet 3   scopolamine (TRANSDERM-SCOP) 1 MG/3DAYS Place 1 patch onto the skin behind the ear every 3 days. 10 patch 12   sennosides-docusate sodium (SENOKOT-S) 8.6-50 MG tablet Take 1 tablet by mouth daily. 30 tablet 3   STIMULANT LAXATIVE 8.6-50 MG tablet TAKE ONE TABLET BY MOUTH AT BEDTIME AS NEEDED FOR MODERATE CONSTIPATION HARD COPY RX REQUIRED 1 tablet 11   traZODone (DESYREL) 50 MG tablet Take by mouth.     vitamin B-12 (CYANOCOBALAMIN) 1000 MCG tablet Take 1,000 mcg by mouth daily.  VITAMIN D-1000 MAX ST 25 MCG (1000 UT) tablet Take 1,000 Units by mouth daily.     divalproex (DEPAKOTE SPRINKLE) 125 MG capsule TAKE 2 CAPSULES BY MOUTH EVERY 12 HOURS 180 capsule 1   No facility-administered medications prior to visit.     Past Medical History:  Diagnosis Date   Alcohol abuse    heavy, clean since April   Alcohol abuse 11/24/2014   Cocaine abuse (HCC)     clean since about 2000   Drug abuse (HCC) 11/24/2014   HIV infection (HCC)    dx 08/08/2008 (unknown how he was exposed)  Initial VL 43,200 and CD4 410 on 08/28/08   Hyperlipidemia    Hypertension    Marijuana abuse    Mild cognitive impairment    likely vascular dementia/psa/?HIV componentCT Head 02/12/08 for htn crisis and concern for  stroke: impression-1. Old right internal capsule and thalamic lacunar infarcts. 2. Mild to moderate chronic small vessell white matter ischemic changes in both cerebral hemisphere, greater on the right   Neuromuscular disorder (HCC)    Seizures (HCC)    "last year had seizure"   Stroke [facility])    Tobacco abuse      Past Surgical History:  Procedure Laterality Date   CLOSED REDUCTION PATELLAR     right knee   HEMORRHOID SURGERY     SHOULDER ARTHROSCOPY  01/2008   w/extensive debridement (Dr Dion Saucier)      Review of Systems  Constitutional:  Negative for appetite change, chills, fatigue, fever and unexpected weight change.  Eyes:  Negative for visual disturbance.  Respiratory:  Negative for cough, chest tightness, shortness of breath and wheezing.   Cardiovascular:  Negative for chest pain and leg swelling.  Gastrointestinal:  Negative for abdominal pain, constipation, diarrhea, nausea and vomiting.  Genitourinary:  Negative for dysuria, flank pain, frequency, genital sores, hematuria and urgency.  Skin:  Negative for rash.  Allergic/Immunologic: Negative for immunocompromised state.  Neurological:  Negative for dizziness and headaches.      Objective:    BP (!) 146/88   Pulse 73   Temp 98.1 F (36.7 C) (Temporal)   Ht 5\' 5"  (1.651 m)   Wt 151 lb (68.5 kg)   BMI 25.13 kg/m  Nursing note and vital signs reviewed.  Physical Exam Constitutional:      General: He is not in acute distress.    Appearance: He is well-developed.  Eyes:     Conjunctiva/sclera: Conjunctivae normal.  Cardiovascular:     Rate and Rhythm: Normal rate and regular rhythm.     Heart sounds: Normal heart sounds. No murmur heard.    No friction rub. No gallop.  Pulmonary:     Effort: Pulmonary effort is normal. No respiratory distress.     Breath sounds: Normal breath sounds. No wheezing or rales.  Chest:     Chest wall: No tenderness.  Abdominal:     General: Bowel sounds are normal.      Palpations: Abdomen is soft.     Tenderness: There is no abdominal tenderness.  Musculoskeletal:     Cervical back: Neck supple.  Lymphadenopathy:     Cervical: No cervical adenopathy.  Skin:    General: Skin is warm and dry.     Findings: No rash.  Neurological:     Mental Status: He is alert. He is disoriented.     Comments: Oriented only to self         10/31/2022   10:45 AM 05/27/2021  2:34 PM 11/12/2020    2:24 PM 11/05/2020    3:09 PM 09/07/2020   11:13 AM  Depression screen PHQ 2/9  Decreased Interest 0 0 0 0 0  Down, Depressed, Hopeless 0 0 0 0 0  PHQ - 2 Score 0 0 0 0 0  Altered sleeping   0  0  Tired, decreased energy   0  0  Change in appetite   0  0  Feeling bad or failure about yourself    0  0  Trouble concentrating   0  0  Moving slowly or fidgety/restless   0  0  Suicidal thoughts   0  0  PHQ-9 Score   0  0  Difficult doing work/chores   Not difficult at all  Not difficult at all       Assessment & Plan:    Patient Active Problem List   Diagnosis Date Noted   Sialorrhea 05/27/2021   Dysphagia 02/06/2020   Physical deconditioning 02/06/2020   Renal mass 01/01/2020   Secondary hyperparathyroidism of renal origin (HCC) 01/01/2020   Benign hypertensive kidney disease with chronic kidney disease 11/19/2019   Proteinuria 11/19/2019   History of substance abuse (HCC) 09/17/2018   Urinary incontinence 09/17/2018   Bilateral primary osteoarthritis of knee 09/13/2018   Decreased activities of daily living (ADL) 09/13/2018   Alcohol abuse 11/24/2014   Dementia with behavioral disturbance 07/02/2014   History of CVA (cerebrovascular accident) 07/02/2014   CKD (chronic kidney disease) stage 4, GFR 15-29 ml/min (HCC) 11/20/2008   Human immunodeficiency virus (HIV) disease (HCC) 08/17/2008   HLD (hyperlipidemia) 06/26/2008   Essential hypertension 06/24/2008     Problem List Items Addressed This Visit       Nervous and Auditory   Dementia with  behavioral disturbance    Mr. Crismon is more cognizant today and able to tell me his name and date of birth which is the most he has communicated to me in a while. Currently at [facility].         Genitourinary   CKD (chronic kidney disease) stage 4, GFR 15-29 ml/min [facility])    Mr. Hagg renal function continues to be tenuous and so far appears to be tolerating Juluca. Check renal function and adjust medications accordingly.         Other   Human immunodeficiency virus (HIV) disease (HCC) - Primary (Chronic)    Mr. Theil appears to have well controlled virus with good adherence and tolerance to Juluca. Reviewed previous lab work and discussed plan of care and renal function. I am not sure he truly understands secondary to his dementia. Check lab work. Continue current dose of Juluca. Plan for follow up in 6 months or sooner if needed with lab work on the same day.      Relevant Orders   COMPLETE METABOLIC PANEL WITH GFR   HIV-1 RNA quant-no reflex-bld   T-helper cell (CD4)- ([facility] clinic only)     I am having Theophilus Kinds. Berrie maintain his hydrocortisone, Aspirin Low Dose, Acetaminophen Extra Strength, Stimulant Laxative, sennosides-docusate sodium, scopolamine, amLODipine, atorvastatin, divalproex, donepezil, hydrALAZINE, labetalol, OLANZapine, LORazepam, lisinopril, traZODone, cyanocobalamin, Vitamin D-1000 Max St, Juluca, and cloNIDine.   Follow-up: Return in about 6 months (around 05/03/2023).   Marcos Eke, MSN, FNP-C Nurse Practitioner [facility] Main number: [phone]

## 2022-10-31 NOTE — Assessment & Plan Note (Signed)
Peter Cox is more cognizant today and able to tell me his name and date of birth which is the most he has communicated to me in a while. Currently at [facility].

## 2022-10-31 NOTE — Assessment & Plan Note (Signed)
Mr. Kristiansen appears to have well controlled virus with good adherence and tolerance to Juluca. Reviewed previous lab work and discussed plan of care and renal function. I am not sure he truly understands secondary to his dementia. Check lab work. Continue current dose of Juluca. Plan for follow up in 6 months or sooner if needed with lab work on the same day.

## 2022-11-01 LAB — COMPLETE METABOLIC PANEL WITH GFR
Alkaline phosphatase (APISO): 58 U/L (ref 35–144)
BUN: 48 mg/dL — ABNORMAL HIGH (ref 7–25)
CO2: 26 mmol/L (ref 20–32)
Calcium: 9.6 mg/dL (ref 8.6–10.3)
Chloride: 108 mmol/L (ref 98–110)
Globulin: 3.1 g/dL (calc) (ref 1.9–3.7)
Potassium: 4.6 mmol/L (ref 3.5–5.3)
Total Protein: 7.3 g/dL (ref 6.1–8.1)

## 2022-11-01 LAB — T-HELPER CELL (CD4) - (RCID CLINIC ONLY)
CD4 % Helper T Cell: 31 % — ABNORMAL LOW (ref 33–65)
CD4 T Cell Abs: 484 /uL (ref 400–1790)

## 2022-11-03 LAB — COMPLETE METABOLIC PANEL WITH GFR
AG Ratio: 1.4 (calc) (ref 1.0–2.5)
ALT: 12 U/L (ref 9–46)
AST: 11 U/L (ref 10–35)
Albumin: 4.2 g/dL (ref 3.6–5.1)
BUN/Creatinine Ratio: 13 (calc) (ref 6–22)
Creat: 3.79 mg/dL — ABNORMAL HIGH (ref 0.70–1.35)
Glucose, Bld: 84 mg/dL (ref 65–99)
Sodium: 143 mmol/L (ref 135–146)
Total Bilirubin: 0.3 mg/dL (ref 0.2–1.2)
eGFR: 17 mL/min/{1.73_m2} — ABNORMAL LOW (ref >=60)

## 2022-11-03 LAB — HIV-1 RNA QUANT-NO REFLEX-BLD
HIV 1 RNA Quant: <20 Copies/mL — ABNORMAL HIGH
HIV-1 RNA Quant, Log: <1.3 Log cps/mL — ABNORMAL HIGH

## 2022-12-16 ENCOUNTER — Ambulatory Visit
Admission: RE | Admit: 2022-12-16 | Discharge: 2022-12-16 | Disposition: A | Discharge status: Home / Self Care | Payer: Medicare (Managed Care) | Source: Ambulatory Visit | Attending: Urology | Admitting: Urology

## 2022-12-16 ENCOUNTER — Other Ambulatory Visit (HOSPITAL_BASED_OUTPATIENT_CLINIC_OR_DEPARTMENT_OTHER): Payer: Medicare (Managed Care)

## 2022-12-16 DIAGNOSIS — N2889 Other specified disorders of kidney and ureter: Secondary | ICD-10-CM | POA: Insufficient documentation

## 2022-12-30 ENCOUNTER — Ambulatory Visit: Payer: Medicare (Managed Care) | Admitting: Urology

## 2023-01-04 ENCOUNTER — Other Ambulatory Visit: Payer: Self-pay | Admitting: Family Medicine

## 2023-01-04 DIAGNOSIS — R2242 Localized swelling, mass and lump, left lower limb: Secondary | ICD-10-CM

## 2023-01-05 ENCOUNTER — Ambulatory Visit
Admission: RE | Admit: 2023-01-05 | Discharge: 2023-01-05 | Disposition: A | Discharge status: Home / Self Care | Payer: Medicare (Managed Care) | Source: Ambulatory Visit | Attending: Family Medicine | Admitting: Family Medicine

## 2023-01-05 DIAGNOSIS — R2242 Localized swelling, mass and lump, left lower limb: Secondary | ICD-10-CM | POA: Diagnosis not present

## 2023-01-06 ENCOUNTER — Ambulatory Visit (INDEPENDENT_AMBULATORY_CARE_PROVIDER_SITE_OTHER): Payer: Medicare (Managed Care) | Admitting: Urology

## 2023-01-06 ENCOUNTER — Encounter: Payer: Self-pay | Admitting: Urology

## 2023-01-06 VITALS — BP 122/80 | HR 74 | Ht 63.0 in | Wt 151.0 lb

## 2023-01-06 DIAGNOSIS — N2889 Other specified disorders of kidney and ureter: Secondary | ICD-10-CM

## 2023-01-06 NOTE — Progress Notes (Signed)
I, Peter Cox,acting as a scribe for Peter Altes, MD.,have documented all relevant documentation on the behalf of Peter Altes, MD,as directed by  Peter Altes, MD while in the presence of Peter Altes, MD.  01/06/2023 12:11 PM   PHILIPP CALLEGARI March 19, 1961 [id number]  Referring provider: Erasmo Downer, MD [address] [place],  [place] [address]  Chief Complaint  Patient presents with   Other   Urologic history: 1.  Indeterminate right renal mass CT without contrast 12 mm right upper pole indeterminate lesion Severe CKD Elected surveillance  HPI: GARV KUECHLE is a 62 y.o. male presents for annual follow-up.   Doing well since last visit No bothersome LUTS Denies dysuria, gross hematuria Denies flank, abdominal or pelvic pain Follow-up RUS 12/16/22 showed a stable 1 cm hypoechoic lesion in the mid portion of the right kidney. A bilateral left simple renal cyst was identified.   PMH: Past Medical History:  Diagnosis Date   Alcohol abuse    heavy, clean since April   Alcohol abuse 11/24/2014   Cocaine abuse (HCC)     clean since about 2000   Drug abuse (HCC) 11/24/2014   HIV infection (HCC)    dx 08/08/2008 (unknown how he was exposed)  Initial VL 43,200 and CD4 410 on 08/28/08   Hyperlipidemia    Hypertension    Marijuana abuse    Mild cognitive impairment    likely vascular dementia/psa/?HIV componentCT Head 02/12/08 for htn crisis and concern for stroke: impression-1. Old right internal capsule and thalamic lacunar infarcts. 2. Mild to moderate chronic small vessell white matter ischemic changes in both cerebral hemisphere, greater on the right   Neuromuscular disorder (HCC)    Seizures (HCC)    "last year had seizure"   Stroke [facility])    Tobacco abuse     Surgical History: Past Surgical History:  Procedure Laterality Date   CLOSED REDUCTION PATELLAR     right knee   HEMORRHOID SURGERY     SHOULDER ARTHROSCOPY  01/2008    w/extensive debridement (Dr Dion Saucier)    Home Medications:  Allergies as of 01/06/2023   No Known Allergies      Medication List        Accurate as of January 06, 2023 12:11 PM. If you have any questions, ask your nurse or doctor.          Acetaminophen Extra Strength 500 MG Tabs TAKE ONE TABLET BY MOUTH TWICE DAILY HARD COPY RX REQUIRED   amLODipine 10 MG tablet Commonly known as: NORVASC Take 1 tablet (10 mg total) by mouth daily.   Aspirin Low Dose 81 MG tablet Generic drug: aspirin EC TAKE ONE TABLET BY MOUTH DAILY * DO NOT CRUSH * HARD COPY RX REQUIRED   atorvastatin 20 MG tablet Commonly known as: LIPITOR Take 1 tablet (20 mg total) by mouth daily.   cloNIDine 0.1 mg/24hr patch Commonly known as: CATAPRES - Dosed in mg/24 hr Place 0.1 mg onto the skin once a week.   cyanocobalamin 1000 MCG tablet Commonly known as: VITAMIN B12 Take 1,000 mcg by mouth daily.   divalproex 125 MG capsule Commonly known as: DEPAKOTE SPRINKLE TAKE 2 CAPSULES BY MOUTH EVERY 12 HOURS   donepezil 10 MG tablet Commonly known as: ARICEPT Take 1 tablet (10 mg total) by mouth at bedtime. Take 1/2 tablet daily for 2 weeks, then increase to 1 tablet daily   hydrALAZINE 25 MG tablet Commonly  known as: APRESOLINE Take 1 tablet by mouth 3 times daily.   hydrocortisone 1 % ointment Apply 1 application topically 2 (two) times daily as needed for itching (hemorrhoids).   Juluca 50-25 MG tablet Generic drug: dolutegravir-rilpivirine Take 1 tablet by mouth daily before lunch.   labetalol 100 MG tablet Commonly known as: NORMODYNE Take 1 tablet (100 mg total) by mouth 2 (two) times daily.   lisinopril 40 MG tablet Commonly known as: ZESTRIL Take 1 tablet (40 mg total) by mouth daily.   LORazepam 1 MG tablet Commonly known as: ATIVAN Take 1 tablet (1 mg total) by mouth every 4 (four) hours as needed for anxiety.   OLANZapine 5 MG tablet Commonly known as: ZYPREXA Take 1 tablet (5 mg  total) by mouth daily as needed (agitation).   scopolamine 1 MG/3DAYS Commonly known as: TRANSDERM-SCOP Place 1 patch onto the skin behind the ear every 3 days.   Stimulant Laxative 8.6-50 MG tablet Generic drug: senna-docusate TAKE ONE TABLET BY MOUTH AT BEDTIME AS NEEDED FOR MODERATE CONSTIPATION HARD COPY RX REQUIRED   sennosides-docusate sodium 8.6-50 MG tablet Commonly known as: SENOKOT-S Take 1 tablet by mouth daily.   traZODone 50 MG tablet Commonly known as: DESYREL Take by mouth.   Vitamin D-1000 Max St 25 MCG (1000 UT) tablet Generic drug: Cholecalciferol Take 1,000 Units by mouth daily.        Allergies: No Known Allergies  Family History: Family History  Problem Relation Age of Onset   Diabetes Mother    Hypertension Mother    Stroke Mother    Diabetes Father    Hypertension Sister    Hypertension Sister    Colon cancer Neg Hx     Social History:  reports that he has been smoking cigarettes. He has a 20 pack-year smoking history. He has never used smokeless tobacco. He reports that he does not currently use alcohol. He reports that he does not currently use drugs after having used the following drugs: Marijuana. Frequency: 7.00 times per week.   Physical Exam: BP 122/80   Pulse 74   Ht 5\' 3"  (1.6 m)   Wt 151 lb (68.5 kg)   BMI 26.75 kg/m   Constitutional:  Alert and oriented, No acute distress. HEENT: [place] AT, moist mucus membranes.  Trachea midline, no masses. Cardiovascular: No clubbing, cyanosis, or edema. Respiratory: Normal respiratory effort, no increased work of breathing. GI: Abdomen is soft, nontender, nondistended, no abdominal masses Skin: No rashes, bruises or suspicious lesions. Neurologic: Grossly intact, no focal deficits, moving all 4 extremities. Psychiatric: Normal mood and affect.   Pertinent Imaging: Renal ultrasound was personally reviewed and interpreted.   Ultrasound renal complete  Narrative CLINICAL DATA:  Follow-up  right renal mass  EXAM: RENAL / URINARY TRACT ULTRASOUND COMPLETE  COMPARISON:  11/01/2021  FINDINGS: Right Kidney:  Renal measurements: 8.9 x 5.3 x 5.2 cm parent = volume: 129 mL. 10 mm echogenic focus is noted consistent with nonobstructing stone. The 1 cm hyperechoic lesion is again seen in the midportion of the right kidney stable from the prior study.  Left Kidney:  Renal measurements: 9.1 x 5.8 x 4.6 cm. = volume: 127 mL. 0.4 cm simple cyst is noted within the upper pole of the left kidney.  Bladder:  Predominately decompressed.  Other:  None.  IMPRESSION: Stable hyperechoic lesion within the right kidney dating back to 2021. This again suggests a benign etiology.  Simple cyst within the left kidney. No further follow-up is  recommended.  Echogenic focus within the right kidney consistent with nonobstructing renal stone.   Electronically Signed By: Alcide Clever M.D. On: 12/22/2022 23:57   Assessment & Plan:    1. Right renal mass Stable right renal mass Continue annual imaging  [facility] [address], [address] [place], [place] [address] [phone]

## 2023-02-03 ENCOUNTER — Other Ambulatory Visit: Payer: Self-pay | Admitting: Family Medicine

## 2023-02-03 DIAGNOSIS — R2242 Localized swelling, mass and lump, left lower limb: Secondary | ICD-10-CM

## 2023-02-24 ENCOUNTER — Ambulatory Visit: Admission: RE | Admit: 2023-02-24 | Payer: Medicare (Managed Care) | Source: Ambulatory Visit

## 2023-03-10 ENCOUNTER — Ambulatory Visit
Admission: RE | Admit: 2023-03-10 | Discharge: 2023-03-10 | Disposition: A | Discharge status: Home / Self Care | Payer: Medicare (Managed Care) | Source: Ambulatory Visit | Attending: Family Medicine | Admitting: Family Medicine

## 2023-03-10 DIAGNOSIS — R2242 Localized swelling, mass and lump, left lower limb: Secondary | ICD-10-CM | POA: Insufficient documentation

## 2023-04-25 ENCOUNTER — Other Ambulatory Visit: Payer: Self-pay

## 2023-04-25 ENCOUNTER — Encounter: Payer: Self-pay | Admitting: Family

## 2023-04-25 ENCOUNTER — Ambulatory Visit (INDEPENDENT_AMBULATORY_CARE_PROVIDER_SITE_OTHER): Payer: Medicare (Managed Care) | Admitting: Family

## 2023-04-25 VITALS — Wt 151.0 lb

## 2023-04-25 DIAGNOSIS — N184 Chronic kidney disease, stage 4 (severe): Secondary | ICD-10-CM | POA: Diagnosis not present

## 2023-04-25 DIAGNOSIS — B20 Human immunodeficiency virus [HIV] disease: Secondary | ICD-10-CM | POA: Diagnosis not present

## 2023-04-25 NOTE — Progress Notes (Unsigned)
Brief Narrative   Patient ID: Peter Cox, male    DOB: 03-Jul-1960, 61 y.o.   MRN: [id number]  Mr. Rachal is a 62 y/o AA male diagnosed with HIV-1 disease in February 2010 with risk factor of heterosexual contact. Initial viral load was 43,200 with CD4 count 320. Entered care at [facility] Stage 2. Genotype with M184V (R- lamivudine and emtricitabine). Currently with dementia. ART experienced with Prezcobix/[name], Biktarvy, and Juluca.   Subjective:    Chief Complaint  Patient presents with   HIV Positive/AIDS    HPI:  Peter Cox is a 62 y.o. male with HIV disease last seen on 10/31/22 with well controlled virus and good adherence and tolerance to Juluca in the setting of CKD Stage 4 and severe dementia. Here today for follow up.  Mr. Fitterer has been doing okay. Not able to provide significant history and primarily obtained from notes received from [facility] in [place]. Blood work completed on 04/04/23 with undetectable viral load and creatinine of 2.63 (CrCl 27).  Laughs inappropriately at times when asked questions. Has received influenza vaccination.   Denies fevers, chills, night sweats, headaches, changes in vision, neck pain/stiffness, nausea, diarrhea, vomiting, lesions or rashes.  Lab Results  Component Value Date   CD4TCELL 31 (L) 10/31/2022   CD4TABS 484 10/31/2022   Lab Results  Component Value Date   HIV1RNAQUANT <20 (H) 10/31/2022     No Known Allergies    Outpatient Medications Prior to Visit  Medication Sig Dispense Refill   ACETAMINOPHEN EXTRA STRENGTH 500 MG tablet TAKE ONE TABLET BY MOUTH TWICE DAILY HARD COPY RX REQUIRED 60 tablet 9   amLODipine (NORVASC) 10 MG tablet Take 1 tablet (10 mg total) by mouth daily. 90 tablet 3   cloNIDine (CATAPRES - DOSED IN MG/24 HR) 0.3 mg/24hr patch 0.3 mg once a week.     dolutegravir-rilpivirine (JULUCA) 50-25 MG tablet Take 1 tablet by mouth daily before lunch. 30 tablet 5   vitamin B-12  (CYANOCOBALAMIN) 1000 MCG tablet Take 1,000 mcg by mouth daily.     VITAMIN D-1000 MAX ST 25 MCG (1000 UT) tablet Take 1,000 Units by mouth daily.     ASPIRIN LOW DOSE 81 MG EC tablet TAKE ONE TABLET BY MOUTH DAILY * DO NOT CRUSH * HARD COPY RX REQUIRED 1 tablet 11   atorvastatin (LIPITOR) 20 MG tablet Take 1 tablet (20 mg total) by mouth daily. 90 tablet 1   cloNIDine (CATAPRES - DOSED IN MG/24 HR) 0.1 mg/24hr patch Place 0.1 mg onto the skin once a week. (Patient not taking: Reported on 04/25/2023)     divalproex (DEPAKOTE SPRINKLE) 125 MG capsule TAKE 2 CAPSULES BY MOUTH EVERY 12 HOURS 180 capsule 1   donepezil (ARICEPT) 10 MG tablet Take 1 tablet (10 mg total) by mouth at bedtime. Take 1/2 tablet daily for 2 weeks, then increase to 1 tablet daily 90 tablet 1   hydrALAZINE (APRESOLINE) 25 MG tablet Take 1 tablet by mouth 3 times daily. 270 tablet 3   hydrocortisone 1 % ointment Apply 1 application topically 2 (two) times daily as needed for itching (hemorrhoids). 30 g 5   labetalol (NORMODYNE) 100 MG tablet Take 1 tablet (100 mg total) by mouth 2 (two) times daily. 180 tablet 1   lisinopril (ZESTRIL) 40 MG tablet Take 1 tablet (40 mg total) by mouth daily. 90 tablet 1   LORazepam (ATIVAN) 1 MG tablet Take 1 tablet (1 mg total) by mouth every  4 (four) hours as needed for anxiety. 20 tablet 4   OLANZapine (ZYPREXA) 5 MG tablet Take 1 tablet (5 mg total) by mouth daily as needed (agitation). 90 tablet 3   scopolamine (TRANSDERM-SCOP) 1 MG/3DAYS Place 1 patch onto the skin behind the ear every 3 days. 10 patch 12   sennosides-docusate sodium (SENOKOT-S) 8.6-50 MG tablet Take 1 tablet by mouth daily. 30 tablet 3   STIMULANT LAXATIVE 8.6-50 MG tablet TAKE ONE TABLET BY MOUTH AT BEDTIME AS NEEDED FOR MODERATE CONSTIPATION HARD COPY RX REQUIRED 1 tablet 11   traZODone (DESYREL) 50 MG tablet Take by mouth.     No facility-administered medications prior to visit.     Past Medical History:  Diagnosis  Date   Alcohol abuse    heavy, clean since April   Alcohol abuse 11/24/2014   Cocaine abuse (HCC)     clean since about 2000   Drug abuse (HCC) 11/24/2014   HIV infection (HCC)    dx 08/08/2008 (unknown how he was exposed)  Initial VL 43,200 and CD4 410 on 08/28/08   Hyperlipidemia    Hypertension    Marijuana abuse    Mild cognitive impairment    likely vascular dementia/psa/?HIV componentCT Head 02/12/08 for htn crisis and concern for stroke: impression-1. Old right internal capsule and thalamic lacunar infarcts. 2. Mild to moderate chronic small vessell white matter ischemic changes in both cerebral hemisphere, greater on the right   Neuromuscular disorder (HCC)    Seizures (HCC)    "last year had seizure"   Stroke [facility])    Tobacco abuse      Past Surgical History:  Procedure Laterality Date   CLOSED REDUCTION PATELLAR     right knee   HEMORRHOID SURGERY     SHOULDER ARTHROSCOPY  01/2008   w/extensive debridement (Dr Dion Saucier)      Review of Systems  Constitutional:  Negative for appetite change, chills, fatigue, fever and unexpected weight change.  Eyes:  Negative for visual disturbance.  Respiratory:  Negative for cough, chest tightness, shortness of breath and wheezing.   Cardiovascular:  Negative for chest pain and leg swelling.  Gastrointestinal:  Negative for abdominal pain, constipation, diarrhea, nausea and vomiting.  Genitourinary:  Negative for dysuria, flank pain, frequency, genital sores, hematuria and urgency.  Skin:  Negative for rash.  Allergic/Immunologic: Negative for immunocompromised state.  Neurological:  Negative for dizziness and headaches.      Objective:    Wt 151 lb (68.5 kg)   BMI 26.75 kg/m  Nursing note and vital signs reviewed.  Physical Exam Constitutional:      General: He is not in acute distress.    Appearance: He is well-developed.  Eyes:     Conjunctiva/sclera: Conjunctivae normal.  Cardiovascular:     Rate and Rhythm: Normal  rate and regular rhythm.     Heart sounds: Normal heart sounds. No murmur heard.    No friction rub. No gallop.  Pulmonary:     Effort: Pulmonary effort is normal. No respiratory distress.     Breath sounds: Normal breath sounds. No wheezing or rales.  Chest:     Chest wall: No tenderness.  Abdominal:     General: Bowel sounds are normal.     Palpations: Abdomen is soft.     Tenderness: There is no abdominal tenderness.  Musculoskeletal:     Cervical back: Neck supple.  Lymphadenopathy:     Cervical: No cervical adenopathy.  Skin:    General: Skin  is warm and dry.     Findings: No rash.  Neurological:     Mental Status: He is alert and oriented to person, place, and time.         04/25/2023   10:56 AM 10/31/2022   10:45 AM 05/27/2021    2:34 PM 11/12/2020    2:24 PM 11/05/2020    3:09 PM  Depression screen PHQ 2/9  Decreased Interest 0 0 0 0 0  Down, Depressed, Hopeless 0 0 0 0 0  PHQ - 2 Score 0 0 0 0 0  Altered sleeping    0   Tired, decreased energy    0   Change in appetite    0   Feeling bad or failure about yourself     0   Trouble concentrating    0   Moving slowly or fidgety/restless    0   Suicidal thoughts    0   PHQ-9 Score    0   Difficult doing work/chores    Not difficult at all        Assessment & Plan:    Patient Active Problem List   Diagnosis Date Noted   Sialorrhea 05/27/2021   Dysphagia 02/06/2020   Physical deconditioning 02/06/2020   Renal mass 01/01/2020   Secondary hyperparathyroidism of renal origin (HCC) 01/01/2020   Benign hypertensive kidney disease with chronic kidney disease 11/19/2019   Proteinuria 11/19/2019   History of substance abuse (HCC) 09/17/2018   Urinary incontinence 09/17/2018   Bilateral primary osteoarthritis of knee 09/13/2018   Decreased activities of daily living (ADL) 09/13/2018   Alcohol abuse 11/24/2014   Dementia with behavioral disturbance 07/02/2014   History of CVA (cerebrovascular accident) 07/02/2014    CKD (chronic kidney disease) stage 4, GFR 15-29 ml/min (HCC) 11/20/2008   Human immunodeficiency virus (HIV) disease (HCC) 08/17/2008   HLD (hyperlipidemia) 06/26/2008   Essential hypertension 06/24/2008     Problem List Items Addressed This Visit       Genitourinary   CKD (chronic kidney disease) stage 4, GFR 15-29 ml/min (HCC)    Renal function remains stable with well controlled virus. Continue to monitor renal function. Would not suspect he would be a candidate if he progresses to dialysis.        Other   Human immunodeficiency virus (HIV) disease (HCC) - Primary (Chronic)    Mr. Cuny continues to have well controlled virus with good adherence and tolerance to Juluca. Reviewed blood work and discussed plan of care and U equals U. Continue current dose of Juluca. Continue care with [facility]. Plan for follow up in 6 months or sooner if needed.         I am having Hatem Cull. Stelle maintain his hydrocortisone, Aspirin Low Dose, Acetaminophen Extra Strength, Stimulant Laxative, sennosides-docusate sodium, scopolamine, amLODipine, atorvastatin, divalproex, donepezil, hydrALAZINE, labetalol, OLANZapine, LORazepam, lisinopril, traZODone, cyanocobalamin, Vitamin D-1000 Max St, Juluca, cloNIDine, and cloNIDine.   Follow-up: Return in about 6 months (around 10/23/2023). or sooner if needed.    Marcos Eke, MSN, FNP-C Nurse Practitioner [facility] for Infectious Disease [facility] Main number: [phone]

## 2023-04-25 NOTE — Patient Instructions (Addendum)
Nice to see you.  Continue to take your Juluca as prescribed.   Let us know if prescription is needed.   Ensure vaccinations are up to date.  Follow up in 6 months or sooner if needed.    Have a great day and stay safe!

## 2023-04-26 ENCOUNTER — Encounter: Payer: Self-pay | Admitting: Family

## 2023-04-26 NOTE — Assessment & Plan Note (Signed)
Renal function remains stable with well controlled virus. Continue to monitor renal function. Would not suspect he would be a candidate if he progresses to dialysis.

## 2023-04-26 NOTE — Assessment & Plan Note (Signed)
Peter Cox continues to have well controlled virus with good adherence and tolerance to Juluca. Reviewed blood work and discussed plan of care and U equals U. Continue current dose of Juluca. Continue care with [facility]. Plan for follow up in 6 months or sooner if needed.

## 2023-10-20 ENCOUNTER — Encounter: Payer: Self-pay | Admitting: Urology

## 2023-10-30 ENCOUNTER — Ambulatory Visit: Payer: Medicare (Managed Care) | Admitting: Family

## 2023-11-17 NOTE — Progress Notes (Signed)
 The ASCVD Risk score (Arnett DK, et al., 2019) failed to calculate for the following reasons:   Risk score cannot be calculated because patient has a medical history suggesting prior/existing ASCVD  Arlon Bergamo, BSN, RN

## 2023-11-29 ENCOUNTER — Encounter: Payer: Self-pay | Admitting: Family

## 2023-11-29 ENCOUNTER — Other Ambulatory Visit: Payer: Self-pay

## 2023-11-29 ENCOUNTER — Ambulatory Visit (INDEPENDENT_AMBULATORY_CARE_PROVIDER_SITE_OTHER): Payer: Medicare (Managed Care) | Admitting: Family

## 2023-11-29 VITALS — BP 135/81 | HR 84 | Wt 151.0 lb

## 2023-11-29 DIAGNOSIS — Z79899 Other long term (current) drug therapy: Secondary | ICD-10-CM

## 2023-11-29 DIAGNOSIS — N184 Chronic kidney disease, stage 4 (severe): Secondary | ICD-10-CM | POA: Diagnosis not present

## 2023-11-29 DIAGNOSIS — F03918 Unspecified dementia, unspecified severity, with other behavioral disturbance: Secondary | ICD-10-CM | POA: Diagnosis not present

## 2023-11-29 DIAGNOSIS — B20 Human immunodeficiency virus [HIV] disease: Secondary | ICD-10-CM | POA: Diagnosis not present

## 2023-11-29 DIAGNOSIS — Z0000 Encounter for general adult medical examination without abnormal findings: Secondary | ICD-10-CM

## 2023-11-29 MED ORDER — JULUCA 50-25 MG PO TABS: 1.0000 | ORAL_TABLET | Freq: Every day | ORAL | 6 refills | Status: AC

## 2023-11-29 NOTE — Progress Notes (Signed)
 Brief Narrative   Patient ID: Peter Cox, male    DOB: [birth year], 63 y.o.   MRN: [id number]  Peter Cox is a 63 y/o AA male diagnosed with HIV-1 disease in February 2010 with risk factor of heterosexual contact. Initial viral load was 43,200 with CD4 count 320. Entered care at [facility] Stage 2. Genotype with M184V (R- lamivudine  and emtricitabine ). Currently with dementia. ART experienced with Prezcobix /[name] , Biktarvy , and Juluca .   Subjective:   No chief complaint on file.   HPI:  Peter Cox is a 63 y.o. male with HIV disease and advanced dementia last seen on 04/25/2023 with well-controlled virus and good adherence and tolerance to Juluca .  Viral load was undetectable with CD4 count 484.  Here today for routine follow-up.  Peter Cox remains in the [facility] program and is present with a medical aide.  He is oriented to self only with history being very limited.  Has been taking medication as prescribed with no adverse side effects.  No new concerns/complaints.  Healthcare maintenance reviewed.  Denies fevers, chills, night sweats, headaches, changes in vision, neck pain/stiffness, nausea, diarrhea, vomiting, lesions or rashes.  Lab Results  Component Value Date   CD4TCELL 31 (L) 10/31/2022   CD4TABS 484 10/31/2022   Lab Results  Component Value Date   HIV1RNAQUANT <20 (H) 10/31/2022     No Known Allergies    Outpatient Medications Prior to Visit  Medication Sig Dispense Refill   ACETAMINOPHEN  EXTRA STRENGTH 500 MG tablet TAKE ONE TABLET BY MOUTH TWICE DAILY HARD COPY RX REQUIRED 60 tablet 9   amLODipine  (NORVASC ) 10 MG tablet Take 1 tablet (10 mg total) by mouth daily. 90 tablet 3   ASPIRIN  LOW DOSE 81 MG EC tablet TAKE ONE TABLET BY MOUTH DAILY * DO NOT CRUSH * HARD COPY RX REQUIRED 1 tablet 11   atorvastatin  (LIPITOR) 20 MG tablet Take 1 tablet (20 mg total) by mouth daily. 90 tablet 1   cloNIDine  (CATAPRES  - DOSED IN MG/24 HR) 0.1 mg/24hr patch Place 0.1 mg onto the  skin once a week.     cloNIDine  (CATAPRES  - DOSED IN MG/24 HR) 0.3 mg/24hr patch 0.3 mg once a week.     donepezil  (ARICEPT ) 10 MG tablet Take 1 tablet (10 mg total) by mouth at bedtime. Take 1/2 tablet daily for 2 weeks, then increase to 1 tablet daily 90 tablet 1   hydrALAZINE  (APRESOLINE ) 25 MG tablet Take 1 tablet by mouth 3 times daily. 270 tablet 3   hydrochlorothiazide (HYDRODIURIL) 12.5 MG tablet Take 12.5 mg by mouth.     hydrocortisone  1 % ointment Apply 1 application topically 2 (two) times daily as needed for itching (hemorrhoids). 30 g 5   labetalol  (NORMODYNE ) 100 MG tablet Take 1 tablet (100 mg total) by mouth 2 (two) times daily. 180 tablet 1   lidocaine (LIDODERM) 5 % Place 1 patch onto the skin daily.     lisinopril  (ZESTRIL ) 40 MG tablet Take 1 tablet (40 mg total) by mouth daily. 90 tablet 1   LORazepam  (ATIVAN ) 1 MG tablet Take 1 tablet (1 mg total) by mouth every 4 (four) hours as needed for anxiety. 20 tablet 4   OLANZapine  (ZYPREXA ) 5 MG tablet Take 1 tablet (5 mg total) by mouth daily as needed (agitation). 90 tablet 3   scopolamine  (TRANSDERM-SCOP) 1 MG/3DAYS Place 1 patch onto the skin behind the ear every 3 days. 10 patch 12   sennosides-docusate sodium  (SENOKOT-S) 8.6-50 MG  tablet Take 1 tablet by mouth daily. 30 tablet 3   STIMULANT LAXATIVE 8.6-50 MG tablet TAKE ONE TABLET BY MOUTH AT BEDTIME AS NEEDED FOR MODERATE CONSTIPATION HARD COPY RX REQUIRED 1 tablet 11   TINACTIN 1 % AERO SMARTSIG:1 liberally Topical Twice Daily     traZODone (DESYREL) 50 MG tablet Take by mouth.     vitamin B-12 (CYANOCOBALAMIN) 1000 MCG tablet Take 1,000 mcg by mouth daily.     VITAMIN D-1000 MAX ST 25 MCG (1000 UT) tablet Take 1,000 Units by mouth daily.     dolutegravir-rilpivirine (JULUCA ) 50-25 MG tablet Take 1 tablet by mouth daily before lunch. 30 tablet 5   divalproex  (DEPAKOTE  SPRINKLE) 125 MG capsule TAKE 2 CAPSULES BY MOUTH EVERY 12 HOURS 180 capsule 1   No  facility-administered medications prior to visit.     Past Medical History:  Diagnosis Date   Alcohol  abuse    heavy, clean since April   Alcohol  abuse 11/24/2014   Cocaine abuse (HCC)     clean since about 2000   Drug abuse (HCC) 11/24/2014   HIV infection (HCC)    dx 08/08/2008 (unknown how he was exposed)  Initial VL 43,200 and CD4 410 on 08/28/08   Hyperlipidemia    Hypertension    Marijuana abuse    Mild cognitive impairment    likely vascular dementia/psa/?HIV componentCT Head 02/12/08 for htn crisis and concern for stroke: impression-1. Old right internal capsule and thalamic lacunar infarcts. 2. Mild to moderate chronic small vessell white matter ischemic changes in both cerebral hemisphere, greater on the right   Neuromuscular disorder (HCC)    Seizures (HCC)    last year had seizure   Stroke [facility])    Tobacco abuse      Past Surgical History:  Procedure Laterality Date   CLOSED REDUCTION PATELLAR     right knee   HEMORRHOID SURGERY     SHOULDER ARTHROSCOPY  01/2008   w/extensive debridement (Dr Agatha Horsfall)        Review of Systems  Unable to perform ROS: Dementia     Objective:   BP 135/81   Pulse 84   Wt 151 lb (68.5 kg)   SpO2 94%   BMI 26.75 kg/m  Nursing note and vital signs reviewed.  Physical Exam Constitutional:      General: He is not in acute distress.    Appearance: He is well-developed.     Comments: Seated in the chair; ambulates with a cane  Cardiovascular:     Rate and Rhythm: Normal rate and regular rhythm.     Heart sounds: Normal heart sounds.  Pulmonary:     Effort: Pulmonary effort is normal.     Breath sounds: Normal breath sounds.  Skin:    General: Skin is warm and dry.  Neurological:     Mental Status: He is alert. He is disoriented.         04/25/2023   10:56 AM 10/31/2022   10:45 AM 05/27/2021    2:34 PM 11/12/2020    2:24 PM 11/05/2020    3:09 PM  Depression screen PHQ 2/9  Decreased Interest 0 0 0 0 0  Down,  Depressed, Hopeless 0 0 0 0 0  PHQ - 2 Score 0 0 0 0 0  Altered sleeping    0   Tired, decreased energy    0   Change in appetite    0   Feeling bad or failure about yourself  0   Trouble concentrating    0   Moving slowly or fidgety/restless    0   Suicidal thoughts    0   PHQ-9 Score    0   Difficult doing work/chores    Not difficult at all          No data to display           The ASCVD Risk score (Arnett DK, et al., 2019) failed to calculate for the following reasons:   Risk score cannot be calculated because patient has a medical history suggesting prior/existing ASCVD      Assessment & Plan:    Patient Active Problem List   Diagnosis Date Noted   Sialorrhea 05/27/2021   Dysphagia 02/06/2020   Physical deconditioning 02/06/2020   Renal mass 01/01/2020   Secondary hyperparathyroidism of renal origin (HCC) 01/01/2020   Benign hypertensive kidney disease with chronic kidney disease 11/19/2019   Proteinuria 11/19/2019   History of substance abuse (HCC) 09/17/2018   Urinary incontinence 09/17/2018   Bilateral primary osteoarthritis of knee 09/13/2018   Decreased activities of daily living (ADL) 09/13/2018   Healthcare maintenance 04/17/2018   Alcohol  abuse 11/24/2014   Dementia with behavioral disturbance 07/02/2014   History of CVA (cerebrovascular accident) 07/02/2014   CKD (chronic kidney disease) stage 4, GFR 15-29 ml/min (HCC) 11/20/2008   Human immunodeficiency virus (HIV) disease (HCC) 08/17/2008   HLD (hyperlipidemia) 06/26/2008   Essential hypertension 06/24/2008     Problem List Items Addressed This Visit       Nervous and Auditory   Dementia with behavioral disturbance   Peter Cox has advanced dementia and is oriented to self. No current behavioral issues.         Genitourinary   CKD (chronic kidney disease) stage 4, GFR 15-29 ml/min (HCC) - Primary   On Juluca  with no adverse side effects. Continue management of other co-morbidities.  Check renal function.         Other   Human immunodeficiency virus (HIV) disease (HCC) (Chronic)   Peter Cox continues to have well-controlled virus with good adherence and tolerance to Juluca .  Reviewed previous lab work and discussed plan of care and U equals U.  His understanding is very limited as he is oriented only to self.  Check lab work.  Social determinants of health reviewed with no interventions indicated and continues at the [facility]. Continue current dose of Juluca . Plan for follow up in 6 months or sooner if needed.       Relevant Medications   TINACTIN 1 % AERO   dolutegravir-rilpivirine (JULUCA ) 50-25 MG tablet   Other Relevant Orders   Comprehensive metabolic panel with GFR   HIV-1 RNA quant-no reflex-bld   T-helper cells (CD4) count (not at [facility])   Healthcare maintenance   Not currently sexually active.  Vaccinations reviewed and due for tetanus, Shingrix and PPSV23 or Prevnar 20. Recommendations provided in AVS.  Routine dental care as able.  Colon cancer screening up to date.  Lung cancer screening per Primary Team.       Other Visit Diagnoses       Pharmacologic therapy       Relevant Orders   Lipid panel        I am having Foye Imperial. Mazer maintain his hydrocortisone , Aspirin  Low Dose, Acetaminophen  Extra Strength, Stimulant Laxative, sennosides-docusate sodium , scopolamine , amLODipine , atorvastatin , divalproex , donepezil , hydrALAZINE , labetalol , OLANZapine , LORazepam , lisinopril , traZODone, cyanocobalamin, Vitamin D-1000 Max St, cloNIDine , cloNIDine , hydrochlorothiazide, Tinactin,  lidocaine, and Juluca .   Meds ordered this encounter  Medications   dolutegravir-rilpivirine (JULUCA ) 50-25 MG tablet    Sig: Take 1 tablet by mouth daily before lunch.    Dispense:  30 tablet    Refill:  6    Supervising Provider:   Liane Redman [[id number]]     Follow-up: Return in about 6 months (around 05/30/2024). or sooner if needed.    Marlan Silva, MSN,  FNP-C Nurse Practitioner [facility] for Infectious Disease [facility] Main number: [phone]

## 2023-11-29 NOTE — Patient Instructions (Addendum)
 Nice to see you.  We will check your lab work today.  Continue to take your medication daily as prescribed.  Refills will be sent to the pharmacy.  Recommended vaccinations - Tetanus, PPSV23 or Prevnar 20, and Shingrix.   Plan for follow up in 6 months or sooner if needed with lab work on the same day.  Have a great day and stay safe!

## 2023-11-29 NOTE — Assessment & Plan Note (Signed)
 Peter Cox has advanced dementia and is oriented to self. No current behavioral issues.

## 2023-11-29 NOTE — Assessment & Plan Note (Signed)
 On Juluca  with no adverse side effects. Continue management of other co-morbidities. Check renal function.

## 2023-11-29 NOTE — Assessment & Plan Note (Signed)
 Not currently sexually active.  Vaccinations reviewed and due for tetanus, Shingrix and PPSV23 or Prevnar 20. Recommendations provided in AVS.  Routine dental care as able.  Colon cancer screening up to date.  Lung cancer screening per Primary Team.

## 2023-11-29 NOTE — Assessment & Plan Note (Signed)
 Peter Cox continues to have well-controlled virus with good adherence and tolerance to Juluca .  Reviewed previous lab work and discussed plan of care and U equals U.  His understanding is very limited as he is oriented only to self.  Check lab work.  Social determinants of health reviewed with no interventions indicated and continues at the [facility]. Continue current dose of Juluca . Plan for follow up in 6 months or sooner if needed.

## 2023-11-30 LAB — COMPREHENSIVE METABOLIC PANEL WITH GFR
AG Ratio: 1.7 (calc) (ref 1.0–2.5)
ALT: 20 U/L (ref 9–46)
AST: 13 U/L (ref 10–35)
Albumin: 4.7 g/dL (ref 3.6–5.1)
Alkaline phosphatase (APISO): 87 U/L (ref 35–144)
BUN/Creatinine Ratio: 14 (calc) (ref 6–22)
BUN: 44 mg/dL — ABNORMAL HIGH (ref 7–25)
CO2: 27 mmol/L (ref 20–32)
Calcium: 9.9 mg/dL (ref 8.6–10.3)
Chloride: 106 mmol/L (ref 98–110)
Creat: 3.25 mg/dL — ABNORMAL HIGH (ref 0.70–1.35)
Globulin: 2.7 g/dL (ref 1.9–3.7)
Glucose, Bld: 71 mg/dL (ref 65–99)
Potassium: 4.3 mmol/L (ref 3.5–5.3)
Sodium: 141 mmol/L (ref 135–146)
Total Bilirubin: 0.6 mg/dL (ref 0.2–1.2)
Total Protein: 7.4 g/dL (ref 6.1–8.1)
eGFR: 20 mL/min/{1.73_m2} — ABNORMAL LOW (ref >=60)

## 2023-11-30 LAB — T-HELPER CELLS (CD4) COUNT (NOT AT ARMC)
Absolute CD4: 624 {cells}/uL (ref 490–1740)
CD4 T Helper %: 36 % (ref 30–61)
Total lymphocyte count: 1751 {cells}/uL (ref 850–3900)

## 2023-11-30 LAB — LIPID PANEL
Cholesterol: 142 mg/dL (ref <200)
HDL: 40 mg/dL (ref >=40)
LDL Cholesterol (Calc): 80 mg/dL
Non-HDL Cholesterol (Calc): 102 mg/dL (ref <130)
Total CHOL/HDL Ratio: 3.6 (calc) (ref <5.0)
Triglycerides: 121 mg/dL (ref <150)

## 2023-12-29 ENCOUNTER — Ambulatory Visit
Admission: RE | Admit: 2023-12-29 | Discharge: 2023-12-29 | Disposition: A | Discharge status: Home / Self Care | Payer: Medicare (Managed Care) | Source: Ambulatory Visit | Attending: Urology | Admitting: Urology

## 2023-12-29 DIAGNOSIS — N2889 Other specified disorders of kidney and ureter: Secondary | ICD-10-CM | POA: Insufficient documentation

## 2024-01-01 ENCOUNTER — Encounter: Payer: Self-pay | Admitting: Urology

## 2024-01-01 ENCOUNTER — Ambulatory Visit (INDEPENDENT_AMBULATORY_CARE_PROVIDER_SITE_OTHER): Payer: Medicare (Managed Care) | Admitting: Urology

## 2024-01-01 VITALS — BP 132/85 | HR 61 | Ht 65.0 in | Wt 144.0 lb

## 2024-01-01 DIAGNOSIS — N2889 Other specified disorders of kidney and ureter: Secondary | ICD-10-CM

## 2024-01-01 NOTE — Progress Notes (Signed)
 01/01/2024 1:01 PM   Peter Cox 06-Mar-1961 [id number]  Referring provider: Myrla Jon HERO, MD [address] [place],  [place] [address]  Chief Complaint  Patient presents with   Other   Urologic history: 1.  Indeterminate right renal mass CT without contrast 12 mm right upper pole indeterminate lesion Severe CKD Elected surveillance  HPI: Peter Cox is a 63 y.o. male presents for annual follow-up.  A family member was with him today  Doing well since last visit No bothersome LUTS Denies dysuria, gross hematuria Denies flank, abdominal or pelvic pain Follow-up RUS 12/29/2023 showed a stable 0.8 cm hypoechoic lesion in the mid portion of the right kidney consistent with a small angiomyolipoma.    PMH: Past Medical History:  Diagnosis Date   Alcohol  abuse    heavy, clean since April   Alcohol  abuse 11/24/2014   Cocaine abuse (HCC)     clean since about 2000   Drug abuse (HCC) 11/24/2014   HIV infection (HCC)    dx 08/08/2008 (unknown how he was exposed)  Initial VL 43,200 and CD4 410 on 08/28/08   Hyperlipidemia    Hypertension    Marijuana abuse    Mild cognitive impairment    likely vascular dementia/psa/?HIV componentCT Head 02/12/08 for htn crisis and concern for stroke: impression-1. Old right internal capsule and thalamic lacunar infarcts. 2. Mild to moderate chronic small vessell white matter ischemic changes in both cerebral hemisphere, greater on the right   Neuromuscular disorder (HCC)    Seizures (HCC)    last year had seizure   Stroke [facility])    Tobacco abuse     Surgical History: Past Surgical History:  Procedure Laterality Date   CLOSED REDUCTION PATELLAR     right knee   HEMORRHOID SURGERY     SHOULDER ARTHROSCOPY  01/2008   w/extensive debridement (Dr Josefina)    Home Medications:  Allergies as of 01/01/2024   No Known Allergies      Medication List        Accurate as of January 01, 2024  1:01 PM. If you have any  questions, ask your nurse or doctor.          Acetaminophen  Extra Strength 500 MG Tabs TAKE ONE TABLET BY MOUTH TWICE DAILY HARD COPY RX REQUIRED   amLODipine  10 MG tablet Commonly known as: NORVASC  Take 1 tablet (10 mg total) by mouth daily.   Aspirin  Low Dose 81 MG tablet Generic drug: aspirin  EC TAKE ONE TABLET BY MOUTH DAILY * DO NOT CRUSH * HARD COPY RX REQUIRED   atorvastatin  20 MG tablet Commonly known as: LIPITOR Take 1 tablet (20 mg total) by mouth daily.   cloNIDine  0.1 mg/24hr patch Commonly known as: CATAPRES  - Dosed in mg/24 hr Place 0.1 mg onto the skin once a week.   cloNIDine  0.3 mg/24hr patch Commonly known as: CATAPRES  - Dosed in mg/24 hr 0.3 mg once a week.   cyanocobalamin 1000 MCG tablet Commonly known as: VITAMIN B12 Take 1,000 mcg by mouth daily.   divalproex  125 MG capsule Commonly known as: DEPAKOTE  SPRINKLE TAKE 2 CAPSULES BY MOUTH EVERY 12 HOURS   donepezil  10 MG tablet Commonly known as: ARICEPT  Take 1 tablet (10 mg total) by mouth at bedtime. Take 1/2 tablet daily for 2 weeks, then increase to 1 tablet daily   hydrALAZINE  25 MG tablet Commonly known as: APRESOLINE  Take 1 tablet by mouth 3 times daily.   hydrochlorothiazide 12.5  MG tablet Commonly known as: HYDRODIURIL Take 12.5 mg by mouth.   hydrocortisone  1 % ointment Apply 1 application topically 2 (two) times daily as needed for itching (hemorrhoids).   Juluca  50-25 MG tablet Generic drug: dolutegravir-rilpivirine Take 1 tablet by mouth daily before lunch.   labetalol  100 MG tablet Commonly known as: NORMODYNE  Take 1 tablet (100 mg total) by mouth 2 (two) times daily.   lidocaine 5 % Commonly known as: LIDODERM Place 1 patch onto the skin daily.   lisinopril  40 MG tablet Commonly known as: ZESTRIL  Take 1 tablet (40 mg total) by mouth daily.   LORazepam  1 MG tablet Commonly known as: ATIVAN  Take 1 tablet (1 mg total) by mouth every 4 (four) hours as needed for  anxiety.   OLANZapine  5 MG tablet Commonly known as: ZYPREXA  Take 1 tablet (5 mg total) by mouth daily as needed (agitation).   scopolamine  1 MG/3DAYS Commonly known as: TRANSDERM-SCOP Place 1 patch onto the skin behind the ear every 3 days.   Stimulant Laxative 8.6-50 MG tablet Generic drug: senna-docusate TAKE ONE TABLET BY MOUTH AT BEDTIME AS NEEDED FOR MODERATE CONSTIPATION HARD COPY RX REQUIRED   sennosides-docusate sodium  8.6-50 MG tablet Commonly known as: SENOKOT-S Take 1 tablet by mouth daily.   Tinactin 1 % Aero Generic drug: Tolnaftate SMARTSIG:1 liberally Topical Twice Daily   traZODone 50 MG tablet Commonly known as: DESYREL Take by mouth.   Vitamin D-1000 Max St 25 MCG (1000 UT) tablet Generic drug: Cholecalciferol Take 1,000 Units by mouth daily.        Allergies: No Known Allergies  Family History: Family History  Problem Relation Age of Onset   Diabetes Mother    Hypertension Mother    Stroke Mother    Diabetes Father    Hypertension Sister    Hypertension Sister    Colon cancer Neg Hx     Social History:  reports that he has been smoking cigarettes. He has a 20 pack-year smoking history. He has never used smokeless tobacco. He reports that he does not currently use alcohol . He reports that he does not currently use drugs after having used the following drugs: Marijuana. Frequency: 7.00 times per week.   Physical Exam: BP 132/85   Pulse 61   Ht 5' 5 (1.651 m)   Wt 144 lb (65.3 kg)   BMI 23.96 kg/m   Constitutional:  Alert and oriented, No acute distress. HEENT: [place] AT Respiratory: Normal respiratory effort, no increased work of breathing. Psychiatric: Normal mood and affect.   Pertinent Imaging: Renal ultrasound performed 12/29/2023 was personally reviewed and interpreted.    Assessment & Plan:    1. Right renal mass Stable right renal mass; probable small angiomyolipoma Continue annual imaging If no significant change on  imaging next year he will be 5 years out from surveillance and can discontinue follow-up imaging    Glendia JAYSON Barba, MD  [facility] [address] [place], [place] [address] [phone]

## 2024-01-02 ENCOUNTER — Telehealth: Payer: Self-pay | Admitting: Family

## 2024-01-02 NOTE — Telephone Encounter (Signed)
 [employer] [employer] called inquiring if Peter Cox's future appts can be virtual due to someone needing to be present with him. His daughter is unable to make it to appts but the pt can come in for labs. Kia can be reached at [phone].

## 2024-01-05 ENCOUNTER — Ambulatory Visit: Payer: Self-pay | Admitting: Urology

## 2024-02-02 ENCOUNTER — Other Ambulatory Visit: Payer: Self-pay

## 2024-02-02 DIAGNOSIS — N2889 Other specified disorders of kidney and ureter: Secondary | ICD-10-CM

## 2024-02-06 ENCOUNTER — Other Ambulatory Visit: Payer: Self-pay

## 2024-02-06 ENCOUNTER — Emergency Department
Admission: EM | Admit: 2024-02-06 | Discharge: 2024-02-06 | Disposition: A | Discharge status: Home / Self Care | Source: Skilled Nursing Facility | Attending: Emergency Medicine | Admitting: Emergency Medicine

## 2024-02-06 ENCOUNTER — Emergency Department

## 2024-02-06 DIAGNOSIS — Z79899 Other long term (current) drug therapy: Secondary | ICD-10-CM | POA: Insufficient documentation

## 2024-02-06 DIAGNOSIS — G510 Bell's palsy: Secondary | ICD-10-CM | POA: Insufficient documentation

## 2024-02-06 DIAGNOSIS — R29810 Facial weakness: Secondary | ICD-10-CM | POA: Diagnosis present

## 2024-02-06 DIAGNOSIS — Z8673 Personal history of transient ischemic attack (TIA), and cerebral infarction without residual deficits: Secondary | ICD-10-CM | POA: Diagnosis not present

## 2024-02-06 DIAGNOSIS — I10 Essential (primary) hypertension: Secondary | ICD-10-CM | POA: Insufficient documentation

## 2024-02-06 DIAGNOSIS — Z21 Asymptomatic human immunodeficiency virus [HIV] infection status: Secondary | ICD-10-CM | POA: Diagnosis not present

## 2024-02-06 DIAGNOSIS — R471 Dysarthria and anarthria: Secondary | ICD-10-CM | POA: Insufficient documentation

## 2024-02-06 DIAGNOSIS — I6782 Cerebral ischemia: Secondary | ICD-10-CM | POA: Diagnosis not present

## 2024-02-06 LAB — CBG MONITORING, ED: Glucose-Capillary: 73 mg/dL (ref 70–99)

## 2024-02-06 LAB — COMPREHENSIVE METABOLIC PANEL WITH GFR
ALT: 29 U/L (ref 0–44)
AST: 21 U/L (ref 15–41)
Albumin: 4.6 g/dL (ref 3.5–5.0)
Alkaline Phosphatase: 92 U/L (ref 38–126)
Anion gap: 12 (ref 5–15)
BUN: 50 mg/dL — ABNORMAL HIGH (ref 8–23)
CO2: 24 mmol/L (ref 22–32)
Calcium: 10.2 mg/dL (ref 8.9–10.3)
Chloride: 108 mmol/L (ref 98–111)
Creatinine, Ser: 3.45 mg/dL — ABNORMAL HIGH (ref 0.61–1.24)
GFR, Estimated: 19 mL/min — ABNORMAL LOW (ref >60)
Glucose, Bld: 88 mg/dL (ref 70–99)
Potassium: 4.9 mmol/L (ref 3.5–5.1)
Sodium: 144 mmol/L (ref 135–145)
Total Bilirubin: 0.9 mg/dL (ref 0.0–1.2)
Total Protein: 8.3 g/dL — ABNORMAL HIGH (ref 6.5–8.1)

## 2024-02-06 LAB — CBC
HCT: 45.4 % (ref 39.0–52.0)
Hemoglobin: 15.3 g/dL (ref 13.0–17.0)
MCH: 29.4 pg (ref 26.0–34.0)
MCHC: 33.7 g/dL (ref 30.0–36.0)
MCV: 87.3 fL (ref 80.0–100.0)
Platelets: 230 K/uL (ref 150–400)
RBC: 5.2 MIL/uL (ref 4.22–5.81)
RDW: 13.5 % (ref 11.5–15.5)
WBC: 7.3 K/uL (ref 4.0–10.5)
nRBC: 0 % (ref 0.0–0.2)

## 2024-02-06 LAB — DIFFERENTIAL
Abs Immature Granulocytes: 0.01 K/uL (ref 0.00–0.07)
Basophils Absolute: 0.1 K/uL (ref 0.0–0.1)
Basophils Relative: 1 %
Eosinophils Absolute: 0.2 K/uL (ref 0.0–0.5)
Eosinophils Relative: 3 %
Immature Granulocytes: 0 %
Lymphocytes Relative: 30 %
Lymphs Abs: 2.2 K/uL (ref 0.7–4.0)
Monocytes Absolute: 0.4 K/uL (ref 0.1–1.0)
Monocytes Relative: 5 %
Neutro Abs: 4.6 K/uL (ref 1.7–7.7)
Neutrophils Relative %: 61 %

## 2024-02-06 LAB — PROTIME-INR
INR: 1.1 (ref 0.8–1.2)
Prothrombin Time: 14.4 s (ref 11.4–15.2)

## 2024-02-06 LAB — ETHANOL: Alcohol, Ethyl (B): <15 mg/dL (ref <15)

## 2024-02-06 LAB — APTT: aPTT: 30 s (ref 24–36)

## 2024-02-06 MED ORDER — SODIUM CHLORIDE 0.9% FLUSH: 3.0000 mL | Freq: Once | INTRAVENOUS | Status: DC

## 2024-02-06 MED ORDER — PREDNISONE 20 MG PO TABS: 60.0000 mg | ORAL_TABLET | Freq: Every day | ORAL | 0 refills | Status: AC

## 2024-02-06 MED ORDER — VALACYCLOVIR HCL 1 G PO TABS: 1000.0000 mg | ORAL_TABLET | Freq: Two times a day (BID) | ORAL | 0 refills | Status: AC

## 2024-02-06 NOTE — Consult Note (Signed)
 NEUROLOGY CONSULT NOTE   Date of service: February 06, 2024 Patient Name: Peter Cox MRN:  [id number] DOB:  January 31, 1961 Chief Complaint: Stroke-left-facial weakness Requesting Provider: Levander Slate, MD  History of Present Illness  Peter Cox is a 63 y.o. male with hx of polysubstance use, HIV, seizures, hypertension, cognitive impairment with documented baseline orientation to self only, old lacunar infarctions with unclear residual deficits, brought in from a adult daycare facility for concerns of right-sided facial weakness. Initially last known well was unclear until the ER RN called and clarified from the facility where he was told that he probably did not have the facial droop around 9:30 AM this morning which has been taken to be his last known well. Code stroke was activated upon confirming the last known well by the ER team. He was seen and examined in the CT scanner. He is unable to provide reliable history at this time The number for daughter is listed in the chart-I called her-phone went straight to voicemail  LKW: Presumably 9:30 AM Modified rankin score: 3-Moderate disability-requires help but walks WITHOUT assistance IV Thrombolysis: Too mild to treat (although NIH stroke scale is 6, the only new deficits he has reasonably are for right facial weakness) EVT: No LVO signs on exam  NIHSS components Score: Comment  1a Level of Conscious 0[x]  1[]  2[]  3[]      1b LOC Questions 0[]  1[]  2[x]       1c LOC Commands 0[x]  1[]  2[]       2 Best Gaze 0[x]  1[]  2[]       3 Visual 0[x]  1[]  2[]  3[]      4 Facial Palsy 0[]  1[]  2[]  3[x]      5a Motor Arm - left 0[x]  1[]  2[]  3[]  4[]  UN[]    5b Motor Arm - Right 0[x]  1[]  2[]  3[]  4[]  UN[]    6a Motor Leg - Left 0[x]  1[]  2[]  3[]  4[]  UN[]    6b Motor Leg - Right 0[x]  1[]  2[]  3[]  4[]  UN[]    7 Limb Ataxia 0[x]  1[]  2[]  UN[]      8 Sensory 0[x]  1[]  2[]  UN[]      9 Best Language 0[x]  1[]  2[]  3[]      10 Dysarthria 0[]  1[x]  2[]  UN[]      11 Extinct. and  Inattention 0[x]  1[]  2[]       TOTAL: 6      ROS  Unable to reliably ascertain  Past History   Past Medical History:  Diagnosis Date   Alcohol  abuse    heavy, clean since April   Alcohol  abuse 11/24/2014   Cocaine abuse (HCC)     clean since about 2000   Drug abuse (HCC) 11/24/2014   HIV infection (HCC)    dx 08/08/2008 (unknown how he was exposed)  Initial VL 43,200 and CD4 410 on 08/28/08   Hyperlipidemia    Hypertension    Marijuana abuse    Mild cognitive impairment    likely vascular dementia/psa/?HIV componentCT Head 02/12/08 for htn crisis and concern for stroke: impression-1. Old right internal capsule and thalamic lacunar infarcts. 2. Mild to moderate chronic small vessell white matter ischemic changes in both cerebral hemisphere, greater on the right   Neuromuscular disorder (HCC)    Seizures (HCC)    last year had seizure   Stroke [facility])    Tobacco abuse     Past Surgical History:  Procedure Laterality Date   CLOSED REDUCTION PATELLAR     right knee   HEMORRHOID SURGERY     SHOULDER  ARTHROSCOPY  01/2008   w/extensive debridement (Dr Josefina)    Family History: Family History  Problem Relation Age of Onset   Diabetes Mother    Hypertension Mother    Stroke Mother    Diabetes Father    Hypertension Sister    Hypertension Sister    Colon cancer Neg Hx     Social History  reports that he has been smoking cigarettes. He has a 20 pack-year smoking history. He has never used smokeless tobacco. He reports that he does not currently use alcohol . He reports that he does not currently use drugs after having used the following drugs: Marijuana. Frequency: 7.00 times per week.  No Known Allergies  Medications  No current facility-administered medications for this encounter.  Current Outpatient Medications:    ACETAMINOPHEN  EXTRA STRENGTH 500 MG tablet, TAKE ONE TABLET BY MOUTH TWICE DAILY HARD COPY RX REQUIRED, Disp: 60 tablet, Rfl: 9   amLODipine  (NORVASC ) 10 MG  tablet, Take 1 tablet (10 mg total) by mouth daily., Disp: 90 tablet, Rfl: 3   ASPIRIN  LOW DOSE 81 MG EC tablet, TAKE ONE TABLET BY MOUTH DAILY * DO NOT CRUSH * HARD COPY RX REQUIRED, Disp: 1 tablet, Rfl: 11   atorvastatin  (LIPITOR) 20 MG tablet, Take 1 tablet (20 mg total) by mouth daily., Disp: 90 tablet, Rfl: 1   cloNIDine  (CATAPRES  - DOSED IN MG/24 HR) 0.1 mg/24hr patch, Place 0.1 mg onto the skin once a week., Disp: , Rfl:    cloNIDine  (CATAPRES  - DOSED IN MG/24 HR) 0.3 mg/24hr patch, 0.3 mg once a week., Disp: , Rfl:    divalproex  (DEPAKOTE  SPRINKLE) 125 MG capsule, TAKE 2 CAPSULES BY MOUTH EVERY 12 HOURS, Disp: 180 capsule, Rfl: 1   dolutegravir-rilpivirine (JULUCA ) 50-25 MG tablet, Take 1 tablet by mouth daily before lunch., Disp: 30 tablet, Rfl: 6   donepezil  (ARICEPT ) 10 MG tablet, Take 1 tablet (10 mg total) by mouth at bedtime. Take 1/2 tablet daily for 2 weeks, then increase to 1 tablet daily, Disp: 90 tablet, Rfl: 1   hydrALAZINE  (APRESOLINE ) 25 MG tablet, Take 1 tablet by mouth 3 times daily., Disp: 270 tablet, Rfl: 3   hydrochlorothiazide (HYDRODIURIL) 12.5 MG tablet, Take 12.5 mg by mouth., Disp: , Rfl:    hydrocortisone  1 % ointment, Apply 1 application topically 2 (two) times daily as needed for itching (hemorrhoids)., Disp: 30 g, Rfl: 5   labetalol  (NORMODYNE ) 100 MG tablet, Take 1 tablet (100 mg total) by mouth 2 (two) times daily., Disp: 180 tablet, Rfl: 1   lidocaine (LIDODERM) 5 %, Place 1 patch onto the skin daily., Disp: , Rfl:    lisinopril  (ZESTRIL ) 40 MG tablet, Take 1 tablet (40 mg total) by mouth daily., Disp: 90 tablet, Rfl: 1   LORazepam  (ATIVAN ) 1 MG tablet, Take 1 tablet (1 mg total) by mouth every 4 (four) hours as needed for anxiety., Disp: 20 tablet, Rfl: 4   OLANZapine  (ZYPREXA ) 5 MG tablet, Take 1 tablet (5 mg total) by mouth daily as needed (agitation)., Disp: 90 tablet, Rfl: 3   scopolamine  (TRANSDERM-SCOP) 1 MG/3DAYS, Place 1 patch onto the skin behind the  ear every 3 days., Disp: 10 patch, Rfl: 12   sennosides-docusate sodium  (SENOKOT-S) 8.6-50 MG tablet, Take 1 tablet by mouth daily., Disp: 30 tablet, Rfl: 3   STIMULANT LAXATIVE 8.6-50 MG tablet, TAKE ONE TABLET BY MOUTH AT BEDTIME AS NEEDED FOR MODERATE CONSTIPATION HARD COPY RX REQUIRED, Disp: 1 tablet, Rfl: 11   TINACTIN  1 % AERO, SMARTSIG:1 liberally Topical Twice Daily, Disp: , Rfl:    traZODone (DESYREL) 50 MG tablet, Take by mouth., Disp: , Rfl:    vitamin B-12 (CYANOCOBALAMIN) 1000 MCG tablet, Take 1,000 mcg by mouth daily., Disp: , Rfl:    VITAMIN D-1000 MAX ST 25 MCG (1000 UT) tablet, Take 1,000 Units by mouth daily., Disp: , Rfl:   Vitals   Vitals:   03/06/24 1139 03-06-2024 1141 03/06/2024 1208  BP: (!) 145/96  (!) 170/95  Pulse: 66  (!) 58  Resp: 18  20  Temp: 97.6 F (36.4 C)    TempSrc: Oral    SpO2: 100%  100%  Weight:  62.6 kg   Height:  5' 5 (1.651 m)     Body mass index is 22.98 kg/m.   Physical Exam   General: Awake alert in no distress HEENT: Normocephalic atraumatic CVS: Regular rhythm Abdomen nondistended nontender Neurological exam Awake alert in no distress Was able to tell me his name Could not tell me correct age or current date. Able to follow commands Mildly dysarthric speech Cranial nerves: Pupils equal round react light, extraocular movements appear unhindered, visual fields full, right upper and lower face appears weak in comparison to the left with him not being able to wrinkle his forehead on the right and not be able to close his right eyelids tightly.  Tongue and palate midline. Motor examination reveals no drift in any of the 4 extremities Sensation appears intact light touch in all 4 extremities Coordination examination with no gross dysmetria  Labs/Imaging/Neurodiagnostic studies   CBC:  Recent Labs  Lab 06-Mar-2024 1210  WBC 7.3  NEUTROABS 4.6  HGB 15.3  HCT 45.4  MCV 87.3  PLT 230   Basic Metabolic Panel:  Lab Results   Component Value Date   NA 141 11/29/2023   K 4.3 11/29/2023   CO2 27 11/29/2023   GLUCOSE 71 11/29/2023   BUN 44 (H) 11/29/2023   CREATININE 3.25 (H) 11/29/2023   CALCIUM  9.9 11/29/2023   GFRNONAA 28 (L) 11/05/2020   GFRAA 33 (L) 11/05/2020   Lipid Panel:  Lab Results  Component Value Date   LDLCALC 80 11/29/2023   HgbA1c:  Lab Results  Component Value Date   HGBA1C 4.5 (L) 08/24/2017   Urine Drug Screen:     Component Value Date/Time   LABOPIA NONE DETECTED 06/21/2019 1930   LABOPIA NONE DETECTED 08/22/2017 2023   COCAINSCRNUR NONE DETECTED 06/21/2019 1930   COCAINSCRNUR NEG 06/24/2008 2035   LABBENZ NONE DETECTED 06/21/2019 1930   LABBENZ NONE DETECTED 08/22/2017 2023   LABBENZ NEG 06/24/2008 2035   AMPHETMU NONE DETECTED 06/21/2019 1930   AMPHETMU NONE DETECTED 08/22/2017 2023   THCU NONE DETECTED 06/21/2019 1930   THCU POSITIVE (A) 08/22/2017 2023   LABBARB NONE DETECTED 06/21/2019 1930   LABBARB NONE DETECTED 08/22/2017 2023    Alcohol  Level     Component Value Date/Time   ETH <10 06/20/2019 1608   INR  Lab Results  Component Value Date   INR 1.1 07/02/2020   APTT  Lab Results  Component Value Date   APTT 33 07/02/2020    CT Head without contrast(Personally reviewed): Aspects 10, no bleed  ASSESSMENT   Peter Cox is a 63 y.o. male past history of polysubstance abuse, HIV, seizures, hypertension, cognitive impairment with documented baseline orientation to self only, or lacunar infarctions with unclear residual deficits brought in from a daycare facility for evaluation of right-sided facial  weakness which according to the facility was not present around 9:30 AM. On examination, he has the entire face-upper and lower face weakness on the right with eyelid closure weakness as well.  Clinically this appears to be Bell's palsy, but a small brainstem infarct remains in the realm of possibility as well. IV thrombolysis not given due to mild nature of  symptoms. Clinical exam not consistent with LVO-hence emergent vessel imaging not performed. Given his history of HIV and focal deficit, MRI of the brain with and without contrast is preferable if renal function allows.  Impression: Right-sided facial weakness, evaluate for Bell's palsy.  Low suspicion for stroke.  RECOMMENDATIONS  MRI brain with and without contrast -If positive for stroke, admit for stroke workup. -If negative for stroke, treat for Bell's palsy.  May need to check with ID regarding any changes in the duration of antiviral/steroid treatment due to history of HIV. Will follow imaging with you and update the plan Plan discussed with Dr. Levander in the ER  ADDENDUM MRI negative for acute process. Can treat facial weakness as Bell's palsy Follow up outpatient neurology in 8-12 weeks ______________________________________________________________________    Signed, Eligio Lav, MD [facility]

## 2024-02-06 NOTE — ED Notes (Signed)
 Peter Cox, Consulting civil engineer spoke to Charity fundraiser at [facility]; they report that patient was last known normal at 0930 and did not have any facial droop.

## 2024-02-06 NOTE — ED Notes (Signed)
Updated patient's daughter via phone.

## 2024-02-06 NOTE — Progress Notes (Signed)
   02/06/24 1148  Spiritual Encounters  Type of Visit Attempt (pt unavailable)  Referral source Code page  Reason for visit Code  OnCall Visit Yes   Chaplain responded to code stroke and pt was in CT.

## 2024-02-06 NOTE — Code Documentation (Signed)
 Stroke Response Nurse Documentation Code Documentation  Peter Cox is a 63 y.o. male arriving to [facility] via [facility] on 02/06/2024 with past medical hx of HTN, polysubstance abuse, tobacco use, mild cognitive impairment. On aspirin  81 mg daily. Code stroke was activated by ED.   Patient from [facility] where he was LKW at 0930 and now complaining of right facial droop. Patient from [facility]. Went to [facility] where facial droop was noticed and EMS called.   Stroke team at the bedside after activation. Labs drawn and patient cleared for CT by Dr. Levander. Patient to CT with team. NIHSS 6, see documentation for details and code stroke times. Patient with disoriented, right facial droop, Expressive aphasia , and dysarthria  on exam. The following imaging was completed:  CT Head. Patient is not a candidate for IV Thrombolytic due to too mild to treat, per MD. Patient is not a candidate for IR due to exam not consistent with LVO, per MD.   Care Plan: every 30 min NIHSS and vitals until outside window at 1400, then every 2 hour. Swallow screen.   Process Delays Noted: none  Bedside handoff with ED RN Laymon FABIENE Burnard KANDICE Hershel  Stroke Response RN

## 2024-02-06 NOTE — ED Notes (Signed)
 Code stroke  called to [facility]  11:45am

## 2024-02-06 NOTE — ED Triage Notes (Addendum)
 Patient is a resident of [facility], was at [facility] when they noticed a right sided facial droop. At this time, last known well is unknown. Patient denies numbness/tingling and all other symptoms.

## 2024-02-06 NOTE — ED Provider Notes (Signed)
 [facility] Provider Note    Event Date/Time   First MD Initiated Contact with Patient 02/06/24 1138     (approximate)   History   Facial Droop   HPI  Peter Cox is a 63 year old male with history of HIV with undetectable viral load, HTN presenting to the emergency department for evaluation of facial droop.  Initially arrived with EMS with unknown last known well.  He was at [facility] where they first noticed that he had facial asymmetry.  On arrival to the ER, call was made to patient's living facility at [facility].  They report that the patient was seen at 9:30 AM and did not have any neurologic findings at that time.  In the setting of this, patient was activated as a code stroke on presentation.    Physical Exam   Triage Vital Signs: ED Triage Vitals  Encounter Vitals Group     BP 02/06/24 1139 (!) 145/96     Girls Systolic BP Percentile --      Girls Diastolic BP Percentile --      Boys Systolic BP Percentile --      Boys Diastolic BP Percentile --      Pulse Rate 02/06/24 1139 66     Resp 02/06/24 1139 18     Temp 02/06/24 1139 97.6 F (36.4 C)     Temp Source 02/06/24 1139 Oral     SpO2 02/06/24 1139 100 %     Weight 02/06/24 1141 138 lb 1.6 oz (62.6 kg)     Height 02/06/24 1141 5' 5 (1.651 m)     Head Circumference --      Peak Flow --      Pain Score 02/06/24 1141 0     Pain Loc --      Pain Education --      Exclude from Growth Chart --     Most recent vital signs: Vitals:   02/06/24 1332 02/06/24 1540  BP:  (!) 152/86  Pulse:  (!) 57  Resp:  12  Temp: 97.8 F (36.6 C)   SpO2:  98%     General: Awake, interactive  CV:  Regular rate, good peripheral perfusion.  Resp:  Unlabored respirations.  Abd:  Nondistended.  Neuro:  Keenly aware, unable to answer month, tells me his age is 6, able to blink eyes and squeeze hands, but weakness of eye closure on the right, intact extraocular movements, no field  cut, there does appear to be drooping of the right side of the face compared to the contralateral side.  Impaired eyebrow elevation of the right eyebrow.  No arm or leg drift.  No limb ataxia.  Normal sensation.  No aphasia, mild dysarthria.  NIH 6.   ED Results / Procedures / Treatments   Labs (all labs ordered are listed, but only abnormal results are displayed) Labs Reviewed  COMPREHENSIVE METABOLIC PANEL WITH GFR - Abnormal; Notable for the following components:      Result Value   BUN 50 (*)    Creatinine, Ser 3.45 (*)    Total Protein 8.3 (*)    GFR, Estimated 19 (*)    All other components within normal limits  PROTIME-INR  APTT  CBC  DIFFERENTIAL  ETHANOL  CBG MONITORING, ED     EKG EKG independently reviewed and interpreted by myself demonstrates:  EKG demonstrate sinus rhythm at a rate of 59, PR 171, QRS 106, QTc 446, no  acute ST changes  RADIOLOGY Imaging independently reviewed and interpreted by myself demonstrates:  CT head without acute bleed MRI brain pending  Formal Radiology Read:  MR BRAIN WO CONTRAST Result Date: 02/06/2024 CLINICAL DATA:  Provided history: Neuro deficit, acute, stroke suspected. Additional history provided: Right-sided facial droop. EXAM: MRI HEAD WITHOUT CONTRAST TECHNIQUE: Multiplanar, multiecho pulse sequences of the brain and surrounding structures were obtained without intravenous contrast. COMPARISON:  Head CT 02/06/2024.  Brain MRI 07/07/2020. FINDINGS: Intermittently motion degraded examination. Most notably, the axial T2 sequence is moderately-to-severely motion degraded, the axial FLAIR sequence is severely motion degraded, the axial T1 sequence is severely motion degraded, the axial SWI sequence is severely motion degraded and the coronal T2 sequence is severely motion degraded. Within this limitation, findings are as follows. Brain: Mild generalized cerebral atrophy. Chronic lacunar infarcts within/about the bilateral deep gray  nuclei, and small chronic infarcts within the bilateral cerebellar hemispheres, not appreciably changed from the prior MRI of 07/08/2020. Background advanced patchy and confluent T2 FLAIR hyperintense signal abnormality within the cerebral white matter, nonspecific but compatible with chronic small vessel ischemic disease. Chronic microhemorrhage within the left cerebellar hemisphere. No acute infarct, intracranial mass or extra-axial fluid collection identified. No midline shift. Vascular: Maintained flow voids within the proximal large arterial vessels. Skull and upper cervical spine: No focal worrisome marrow lesion. Sinuses/Orbits: No mass or acute finding within the imaged orbits. No significant paranasal sinus disease. IMPRESSION: 1. Significantly motion degraded examination as described. Within this limitation, findings are as follows. 2. No evidence of an acute intracranial abnormality. 3. Parenchymal atrophy, chronic small vessel ischemic disease and chronic infarcts, as detailed. Electronically Signed   By: Rockey Childs D.O.   On: 02/06/2024 15:34   CT HEAD CODE STROKE WO CONTRAST Result Date: 02/06/2024 EXAM: CT HEAD WITHOUT CONTRAST 02/06/2024 12:00:55 PM TECHNIQUE: CT of the head was performed without the administration of intravenous contrast. Automated exposure control, iterative reconstruction, and/or weight based adjustment of the mA/kV was utilized to reduce the radiation dose to as low as reasonably achievable. COMPARISON: MRI head 119.22 CLINICAL HISTORY: Neuro deficit, acute, stroke suspected. CODE STROKE DR DEEDRA [phone]; Powell, Consulting civil engineer spoke to Charity fundraiser at [facility]; they report that patient was last known normal at 0930 and did not have any facial droop. ; Patient is a resident of [facility], was at [employer] when they noticed a left sided facial droop. At this time, last known well is unknown. Patient denies numbness/tingling and all other symptoms.  FINDINGS: BRAIN AND VENTRICLES: No acute hemorrhage. Gray-white differentiation is preserved. No hydrocephalus. No extra-axial collection. No mass effect or midline shift. Nonspecific hypoattenuation in the periventricular and subcortical white matter, most likely representing chronic small vessel disease. Remote lacunar infarcts in the right basal ganglia and bilateral thalami. Mild volume loss. Remote infarcts in the inferior right cerebellum. ORBITS: No acute abnormality. SINUSES: No acute abnormality. SOFT TISSUES AND SKULL: No acute soft tissue abnormality. Chronic deformity of the left zygomatic arch. [place] Stroke Program Early CT (ASPECT) Score: Ganglionic (caudate, IC, lentiform nucleus, insula, M1-M3): 7 Supraganglionic (M4-M6): 3 Total: 10 IMPRESSION: 1. No acute intracranial abnormality. 2. Remote lacunar infarcts in the right basal ganglia and bilateral thalami. Remote infarcts in the inferior right cerebellum. 3. Chronic small vessel disease. 4. Findings messaged to Dr. Arora at 12:08PM on 02/06/24. Electronically signed by: Donnice Mania MD 02/06/2024 12:09 PM EDT RP Workstation: [id number]    PROCEDURES:  Critical Care performed:  No  Procedures   MEDICATIONS ORDERED IN ED: Medications - No data to display    IMPRESSION / MDM / ASSESSMENT AND PLAN / ED COURSE  I reviewed the triage vital signs and the nursing notes.  Differential diagnosis includes, but is not limited to, acute CVA, Bell's palsy, chronic deficit, TIA  Patient's presentation is most consistent with acute presentation with potential threat to life or bodily function.  63 year old male presenting to the emergency department with facial droop.  Initial unknown last known well, but confirmed to be within the thrombolytic window on presentation here.  Does also have some dysarthria.  Less than what was able to be ascertained from patient's facility of 9:30 AM.  He does seem to have both upper and lower facial this on  exam which is more suggestive of Bell's palsy, but with acute onset, code stroke was activated.  Patient was evaluated by Dr. Arora.  Negative head CT, but did recommend MRI to further evaluate given risk factors.  With mild symptoms, did not recommend thrombolytic administration.  If MRI negative, recommends treatment for Bell's palsy, discussion with ID to check on duration of treatment in the setting of his HIV treatment.   Clinical Course as of 02/06/24 1601  Tue Feb 06, 2024  1545 Discussed with Dr. Fayette if any modifications to patient's treatment for Bell's palsy needed in the setting of his HIV treatment.  She reported that he is fine to receive steroids and acyclovir as would be normally indicated for Bell's palsy. [NR]  1546 Received update from Dr. Arora that he had reviewed patient's MRI brain and agree that it is negative, okay to discharge with treatment for Bell's palsy. [NR]    Clinical Course User Index [NR] Levander Slate, MD   Patient reassessed and updated on results of workup.  He is comfortable plan will for discharge on steroids and antivirals with outpatient follow-up.  Strict return precautions provided.  Prescriptions for prednisone  and valacyclovir  provided to patient.  Patient discharged in stable condition.   FINAL CLINICAL IMPRESSION(S) / ED DIAGNOSES   Final diagnoses:  Bell's palsy     Rx / DC Orders   ED Discharge Orders          Ordered    valACYclovir  (VALTREX ) 1000 MG tablet  2 times daily        02/06/24 1601    predniSONE  (DELTASONE ) 20 MG tablet  Daily with breakfast        02/06/24 1601             Note:  This document was prepared using Dragon voice recognition software and may include unintentional dictation errors.   Levander Slate, MD 02/06/24 [phone]

## 2024-02-06 NOTE — Discharge Instructions (Addendum)
 You were seen in the ER today for your facial droop.  Your testing fortunately did not show signs of a new stroke.  I suspect your symptoms are related to something called Bell's palsy.  I have included prescription for a antiviral and steroid medication to take for the next week.  Follow with your primary care doctor for further evaluation.  Return to the ER for new or worsening symptoms.

## 2024-02-23 NOTE — Progress Notes (Signed)
 Evaluate coagulapathy

## 2024-05-01 ENCOUNTER — Other Ambulatory Visit: Payer: Self-pay

## 2024-05-01 DIAGNOSIS — B20 Human immunodeficiency virus [HIV] disease: Secondary | ICD-10-CM

## 2024-05-03 ENCOUNTER — Other Ambulatory Visit: Payer: Self-pay

## 2024-05-03 DIAGNOSIS — R9389 Abnormal findings on diagnostic imaging of other specified body structures: Secondary | ICD-10-CM

## 2024-05-10 ENCOUNTER — Other Ambulatory Visit

## 2024-05-24 ENCOUNTER — Telehealth: Admitting: Family

## 2024-05-30 ENCOUNTER — Ambulatory Visit
Admission: RE | Admit: 2024-05-30 | Discharge: 2024-05-30 | Disposition: A | Discharge status: Home / Self Care | Source: Ambulatory Visit

## 2024-05-30 DIAGNOSIS — R9389 Abnormal findings on diagnostic imaging of other specified body structures: Secondary | ICD-10-CM

## 2024-06-07 ENCOUNTER — Other Ambulatory Visit

## 2024-06-17 ENCOUNTER — Telehealth: Admitting: Family

## 2024-07-04 ENCOUNTER — Other Ambulatory Visit: Payer: Self-pay

## 2024-07-18 ENCOUNTER — Telehealth: Admitting: Family

## 2024-07-18 ENCOUNTER — Other Ambulatory Visit: Payer: Self-pay

## 2024-12-27 ENCOUNTER — Other Ambulatory Visit

## 2024-12-31 ENCOUNTER — Ambulatory Visit: Admitting: Urology
